# Patient Record
Sex: Male | Born: 1950 | Race: White | Hispanic: No | Marital: Married | Smoking: Never smoker
Health system: Southern US, Community
[De-identification: ages and names within clinical notes are randomized; demographics above are authoritative.]

## PROBLEM LIST (undated history)

## (undated) DIAGNOSIS — G629 Polyneuropathy, unspecified: Secondary | ICD-10-CM

## (undated) DIAGNOSIS — C801 Malignant (primary) neoplasm, unspecified: Secondary | ICD-10-CM

## (undated) DIAGNOSIS — E785 Hyperlipidemia, unspecified: Secondary | ICD-10-CM

## (undated) DIAGNOSIS — M1990 Unspecified osteoarthritis, unspecified site: Secondary | ICD-10-CM

## (undated) DIAGNOSIS — G5603 Carpal tunnel syndrome, bilateral upper limbs: Secondary | ICD-10-CM

## (undated) DIAGNOSIS — Z973 Presence of spectacles and contact lenses: Secondary | ICD-10-CM

## (undated) DIAGNOSIS — E039 Hypothyroidism, unspecified: Secondary | ICD-10-CM

## (undated) DIAGNOSIS — J849 Interstitial pulmonary disease, unspecified: Secondary | ICD-10-CM

## (undated) DIAGNOSIS — F329 Major depressive disorder, single episode, unspecified: Secondary | ICD-10-CM

## (undated) DIAGNOSIS — F32A Depression, unspecified: Secondary | ICD-10-CM

## (undated) DIAGNOSIS — Z8616 Personal history of COVID-19: Secondary | ICD-10-CM

## (undated) DIAGNOSIS — K2270 Barrett's esophagus without dysplasia: Secondary | ICD-10-CM

## (undated) DIAGNOSIS — K219 Gastro-esophageal reflux disease without esophagitis: Secondary | ICD-10-CM

## (undated) DIAGNOSIS — K639 Disease of intestine, unspecified: Secondary | ICD-10-CM

## (undated) DIAGNOSIS — IMO0001 Reserved for inherently not codable concepts without codable children: Secondary | ICD-10-CM

## (undated) DIAGNOSIS — J189 Pneumonia, unspecified organism: Secondary | ICD-10-CM

## (undated) DIAGNOSIS — I10 Essential (primary) hypertension: Secondary | ICD-10-CM

## (undated) HISTORY — PX: OTHER SURGICAL HISTORY: SHX169

## (undated) HISTORY — DX: Personal history of COVID-19: Z86.16

## (undated) HISTORY — PX: HERNIA REPAIR: SHX51

## (undated) HISTORY — DX: Malignant (primary) neoplasm, unspecified: C80.1

## (undated) HISTORY — PX: APPENDECTOMY: SHX54

## (undated) HISTORY — DX: Pneumonia, unspecified organism: J18.9

## (undated) HISTORY — PX: JOINT REPLACEMENT: SHX530

## (undated) HISTORY — PX: TOTAL HIP ARTHROPLASTY: SHX124

## (undated) HISTORY — DX: Interstitial pulmonary disease, unspecified: J84.9

## (undated) HISTORY — DX: Barrett's esophagus without dysplasia: K22.70

## (undated) HISTORY — DX: Polyneuropathy, unspecified: G62.9

## (undated) HISTORY — PX: TONSILLECTOMY: SUR1361

---

## 2004-08-09 ENCOUNTER — Ambulatory Visit (HOSPITAL_COMMUNITY): Admission: RE | Admit: 2004-08-09 | Discharge: 2004-08-10 | Payer: Self-pay | Admitting: Neurological Surgery

## 2005-09-18 HISTORY — PX: CERVICAL DISCECTOMY: SHX98

## 2008-09-18 HISTORY — PX: LAPAROSCOPIC APPENDECTOMY: SUR753

## 2009-04-09 ENCOUNTER — Encounter: Admission: RE | Admit: 2009-04-09 | Discharge: 2009-04-09 | Payer: Self-pay | Admitting: Family Medicine

## 2009-04-15 ENCOUNTER — Inpatient Hospital Stay (HOSPITAL_COMMUNITY): Admission: EM | Admit: 2009-04-15 | Discharge: 2009-04-17 | Payer: Self-pay | Admitting: Emergency Medicine

## 2009-04-15 ENCOUNTER — Encounter: Admission: RE | Admit: 2009-04-15 | Discharge: 2009-04-15 | Payer: Self-pay | Admitting: Family Medicine

## 2009-04-20 ENCOUNTER — Ambulatory Visit (HOSPITAL_COMMUNITY): Admission: RE | Admit: 2009-04-20 | Discharge: 2009-04-20 | Payer: Self-pay | Admitting: Surgery

## 2009-04-23 ENCOUNTER — Inpatient Hospital Stay (HOSPITAL_COMMUNITY): Admission: RE | Admit: 2009-04-23 | Discharge: 2009-04-27 | Payer: Self-pay | Admitting: Surgery

## 2009-04-23 ENCOUNTER — Encounter (INDEPENDENT_AMBULATORY_CARE_PROVIDER_SITE_OTHER): Payer: Self-pay | Admitting: Surgery

## 2009-05-02 ENCOUNTER — Emergency Department (HOSPITAL_COMMUNITY): Admission: EM | Admit: 2009-05-02 | Discharge: 2009-05-02 | Payer: Self-pay | Admitting: Emergency Medicine

## 2009-05-02 ENCOUNTER — Ambulatory Visit (HOSPITAL_COMMUNITY): Admission: EM | Admit: 2009-05-02 | Discharge: 2009-05-02 | Payer: Self-pay | Admitting: Emergency Medicine

## 2009-09-18 HISTORY — PX: INCISIONAL HERNIA REPAIR: SHX193

## 2009-10-22 ENCOUNTER — Ambulatory Visit (HOSPITAL_COMMUNITY): Admission: RE | Admit: 2009-10-22 | Discharge: 2009-10-23 | Payer: Self-pay | Admitting: Surgery

## 2010-03-28 ENCOUNTER — Encounter: Admission: RE | Admit: 2010-03-28 | Discharge: 2010-03-28 | Payer: Self-pay | Admitting: Family Medicine

## 2010-10-09 ENCOUNTER — Encounter: Payer: Self-pay | Admitting: Family Medicine

## 2010-12-07 LAB — BASIC METABOLIC PANEL
BUN: 13 mg/dL (ref 6–23)
CO2: 28 mEq/L (ref 19–32)
Calcium: 9.1 mg/dL (ref 8.4–10.5)
Chloride: 108 mEq/L (ref 96–112)
Creatinine, Ser: 0.94 mg/dL (ref 0.4–1.5)
GFR calc Af Amer: >60 mL/min (ref >60)
GFR calc non Af Amer: >60 mL/min (ref >60)
Glucose, Bld: 94 mg/dL (ref 70–99)
Potassium: 4.5 mEq/L (ref 3.5–5.1)
Sodium: 141 mEq/L (ref 135–145)

## 2010-12-07 LAB — DIFFERENTIAL
Basophils Absolute: 0 10*3/uL (ref 0.0–0.1)
Basophils Relative: 1 % (ref 0–1)
Eosinophils Absolute: 0.2 10*3/uL (ref 0.0–0.7)
Eosinophils Relative: 4 % (ref 0–5)
Lymphocytes Relative: 32 % (ref 12–46)
Lymphs Abs: 1.7 10*3/uL (ref 0.7–4.0)
Monocytes Absolute: 0.6 10*3/uL (ref 0.1–1.0)
Monocytes Relative: 11 % (ref 3–12)
Neutro Abs: 2.7 10*3/uL (ref 1.7–7.7)
Neutrophils Relative %: 52 % (ref 43–77)

## 2010-12-07 LAB — URINALYSIS, ROUTINE W REFLEX MICROSCOPIC
Bilirubin Urine: NEGATIVE
Glucose, UA: NEGATIVE mg/dL
Hgb urine dipstick: NEGATIVE
Ketones, ur: NEGATIVE mg/dL
Nitrite: NEGATIVE
Protein, ur: NEGATIVE mg/dL
Specific Gravity, Urine: 1.008 (ref 1.005–1.030)
Urobilinogen, UA: 0.2 mg/dL (ref 0.0–1.0)
pH: 7 (ref 5.0–8.0)

## 2010-12-07 LAB — CBC
HCT: 40.6 % (ref 39.0–52.0)
Hemoglobin: 13.5 g/dL (ref 13.0–17.0)
MCHC: 33.2 g/dL (ref 30.0–36.0)
MCV: 83.8 fL (ref 78.0–100.0)
Platelets: 207 10*3/uL (ref 150–400)
RBC: 4.85 MIL/uL (ref 4.22–5.81)
RDW: 16.5 % — ABNORMAL HIGH (ref 11.5–15.5)
WBC: 5.2 10*3/uL (ref 4.0–10.5)

## 2010-12-07 LAB — PROTIME-INR
INR: 0.96 (ref 0.00–1.49)
Prothrombin Time: 12.7 seconds (ref 11.6–15.2)

## 2010-12-24 LAB — CBC
HCT: 31.8 % — ABNORMAL LOW (ref 39.0–52.0)
HCT: 33.1 % — ABNORMAL LOW (ref 39.0–52.0)
HCT: 33.6 % — ABNORMAL LOW (ref 39.0–52.0)
HCT: 34.7 % — ABNORMAL LOW (ref 39.0–52.0)
Hemoglobin: 10.6 g/dL — ABNORMAL LOW (ref 13.0–17.0)
Hemoglobin: 10.9 g/dL — ABNORMAL LOW (ref 13.0–17.0)
Hemoglobin: 11.1 g/dL — ABNORMAL LOW (ref 13.0–17.0)
Hemoglobin: 11.5 g/dL — ABNORMAL LOW (ref 13.0–17.0)
MCHC: 32.3 g/dL (ref 30.0–36.0)
MCHC: 33.1 g/dL (ref 30.0–36.0)
MCHC: 33.4 g/dL (ref 30.0–36.0)
MCHC: 33.6 g/dL (ref 30.0–36.0)
MCV: 88.3 fL (ref 78.0–100.0)
MCV: 88.3 fL (ref 78.0–100.0)
MCV: 88.8 fL (ref 78.0–100.0)
MCV: 89.2 fL (ref 78.0–100.0)
Platelets: 295 10*3/uL (ref 150–400)
Platelets: 362 10*3/uL (ref 150–400)
Platelets: 376 10*3/uL (ref 150–400)
Platelets: 386 10*3/uL (ref 150–400)
RBC: 3.57 MIL/uL — ABNORMAL LOW (ref 4.22–5.81)
RBC: 3.75 MIL/uL — ABNORMAL LOW (ref 4.22–5.81)
RBC: 3.79 MIL/uL — ABNORMAL LOW (ref 4.22–5.81)
RBC: 3.93 MIL/uL — ABNORMAL LOW (ref 4.22–5.81)
RDW: 14.1 % (ref 11.5–15.5)
RDW: 14.1 % (ref 11.5–15.5)
RDW: 14.2 % (ref 11.5–15.5)
RDW: 14.2 % (ref 11.5–15.5)
WBC: 14 10*3/uL — ABNORMAL HIGH (ref 4.0–10.5)
WBC: 7.4 10*3/uL (ref 4.0–10.5)
WBC: 8.6 10*3/uL (ref 4.0–10.5)
WBC: 9.8 10*3/uL (ref 4.0–10.5)

## 2010-12-24 LAB — CULTURE, ROUTINE-ABSCESS

## 2010-12-24 LAB — DIFFERENTIAL
Basophils Absolute: 0 10*3/uL (ref 0.0–0.1)
Basophils Relative: 0 % (ref 0–1)
Eosinophils Absolute: 0.2 10*3/uL (ref 0.0–0.7)
Eosinophils Relative: 2 % (ref 0–5)
Lymphocytes Relative: 17 % (ref 12–46)
Lymphs Abs: 1.2 10*3/uL (ref 0.7–4.0)
Monocytes Absolute: 0.8 10*3/uL (ref 0.1–1.0)
Monocytes Relative: 10 % (ref 3–12)
Neutro Abs: 5.2 10*3/uL (ref 1.7–7.7)
Neutrophils Relative %: 70 % (ref 43–77)

## 2010-12-24 LAB — CULTURE, BLOOD (ROUTINE X 2)
Culture: NO GROWTH
Culture: NO GROWTH

## 2010-12-24 LAB — BASIC METABOLIC PANEL
BUN: 9 mg/dL (ref 6–23)
CO2: 25 mEq/L (ref 19–32)
Calcium: 8.4 mg/dL (ref 8.4–10.5)
Chloride: 100 mEq/L (ref 96–112)
Creatinine, Ser: 0.76 mg/dL (ref 0.4–1.5)
GFR calc Af Amer: >60 mL/min (ref >60)
GFR calc non Af Amer: >60 mL/min (ref >60)
Glucose, Bld: 154 mg/dL — ABNORMAL HIGH (ref 70–99)
Potassium: 5.1 mEq/L (ref 3.5–5.1)
Sodium: 132 mEq/L — ABNORMAL LOW (ref 135–145)

## 2010-12-25 LAB — BODY FLUID CULTURE

## 2010-12-25 LAB — CBC
HCT: 33.3 % — ABNORMAL LOW (ref 39.0–52.0)
HCT: 35.5 % — ABNORMAL LOW (ref 39.0–52.0)
HCT: 35.6 % — ABNORMAL LOW (ref 36.0–46.0)
Hemoglobin: 10.9 g/dL — ABNORMAL LOW (ref 13.0–17.0)
Hemoglobin: 11.3 g/dL — ABNORMAL LOW (ref 12.0–15.0)
Hemoglobin: 12 g/dL — ABNORMAL LOW (ref 13.0–17.0)
MCHC: 31.8 g/dL (ref 30.0–36.0)
MCHC: 32.9 g/dL (ref 30.0–36.0)
MCHC: 33.8 g/dL (ref 30.0–36.0)
MCV: 88 fL (ref 78.0–100.0)
MCV: 88.5 fL (ref 78.0–100.0)
MCV: 89.6 fL (ref 78.0–100.0)
Platelets: 215 10*3/uL (ref 150–400)
Platelets: 252 10*3/uL (ref 150–400)
Platelets: 414 10*3/uL — ABNORMAL HIGH (ref 150–400)
RBC: 3.71 MIL/uL — ABNORMAL LOW (ref 4.22–5.81)
RBC: 4.02 MIL/uL (ref 3.87–5.11)
RBC: 4.03 MIL/uL — ABNORMAL LOW (ref 4.22–5.81)
RDW: 13.8 % (ref 11.5–15.5)
RDW: 14.4 % (ref 11.5–15.5)
RDW: 14.6 % (ref 11.5–15.5)
WBC: 12.5 10*3/uL — ABNORMAL HIGH (ref 4.0–10.5)
WBC: 8.1 10*3/uL (ref 4.0–10.5)
WBC: 9.7 10*3/uL (ref 4.0–10.5)

## 2010-12-25 LAB — DIFFERENTIAL
Basophils Absolute: 0 10*3/uL (ref 0.0–0.1)
Basophils Absolute: 0 10*3/uL (ref 0.0–0.1)
Basophils Absolute: 0.1 10*3/uL (ref 0.0–0.1)
Basophils Relative: 0 % (ref 0–1)
Basophils Relative: 0 % (ref 0–1)
Basophils Relative: 1 % (ref 0–1)
Eosinophils Absolute: 0 10*3/uL (ref 0.0–0.7)
Eosinophils Absolute: 0.2 10*3/uL (ref 0.0–0.7)
Eosinophils Absolute: 0.2 10*3/uL (ref 0.0–0.7)
Eosinophils Relative: 1 % (ref 0–5)
Eosinophils Relative: 1 % (ref 0–5)
Eosinophils Relative: 2 % (ref 0–5)
Lymphocytes Relative: 13 % (ref 12–46)
Lymphocytes Relative: 14 % (ref 12–46)
Lymphocytes Relative: 15 % (ref 12–46)
Lymphs Abs: 1.2 10*3/uL (ref 0.7–4.0)
Lymphs Abs: 1.4 10*3/uL (ref 0.7–4.0)
Lymphs Abs: 1.7 10*3/uL (ref 0.7–4.0)
Monocytes Absolute: 0.7 10*3/uL (ref 0.1–1.0)
Monocytes Absolute: 1.2 10*3/uL — ABNORMAL HIGH (ref 0.1–1.0)
Monocytes Absolute: 1.2 10*3/uL — ABNORMAL HIGH (ref 0.1–1.0)
Monocytes Relative: 10 % (ref 3–12)
Monocytes Relative: 12 % (ref 3–12)
Monocytes Relative: 8 % (ref 3–12)
Neutro Abs: 6.2 10*3/uL (ref 1.7–7.7)
Neutro Abs: 7 10*3/uL (ref 1.7–7.7)
Neutro Abs: 9.4 10*3/uL — ABNORMAL HIGH (ref 1.7–7.7)
Neutrophils Relative %: 72 % (ref 43–77)
Neutrophils Relative %: 75 % (ref 43–77)
Neutrophils Relative %: 76 % (ref 43–77)

## 2010-12-25 LAB — COMPREHENSIVE METABOLIC PANEL
ALT: 21 U/L (ref 0–53)
ALT: 33 U/L (ref 0–35)
AST: 26 U/L (ref 0–37)
AST: 36 U/L (ref 0–37)
Albumin: 2.6 g/dL — ABNORMAL LOW (ref 3.5–5.2)
Albumin: 3.4 g/dL — ABNORMAL LOW (ref 3.5–5.2)
Alkaline Phosphatase: 58 U/L (ref 39–117)
Alkaline Phosphatase: 67 U/L (ref 39–117)
BUN: 10 mg/dL (ref 6–23)
BUN: 15 mg/dL (ref 6–23)
CO2: 23 mEq/L (ref 19–32)
CO2: 27 mEq/L (ref 19–32)
Calcium: 8.7 mg/dL (ref 8.4–10.5)
Calcium: 9.1 mg/dL (ref 8.4–10.5)
Chloride: 103 mEq/L (ref 96–112)
Chloride: 104 mEq/L (ref 96–112)
Creatinine, Ser: 0.94 mg/dL (ref 0.4–1.2)
Creatinine, Ser: 1.12 mg/dL (ref 0.4–1.5)
GFR calc Af Amer: >60 mL/min (ref >60)
GFR calc Af Amer: >60 mL/min (ref >60)
GFR calc non Af Amer: >60 mL/min (ref >60)
GFR calc non Af Amer: >60 mL/min (ref >60)
Glucose, Bld: 88 mg/dL (ref 70–99)
Glucose, Bld: 94 mg/dL (ref 70–99)
Potassium: 3.6 mEq/L (ref 3.5–5.1)
Potassium: 4.5 mEq/L (ref 3.5–5.1)
Sodium: 136 mEq/L (ref 135–145)
Sodium: 138 mEq/L (ref 135–145)
Total Bilirubin: 0.6 mg/dL (ref 0.3–1.2)
Total Bilirubin: 0.8 mg/dL (ref 0.3–1.2)
Total Protein: 7.4 g/dL (ref 6.0–8.3)
Total Protein: 7.5 g/dL (ref 6.0–8.3)

## 2010-12-25 LAB — ANAEROBIC CULTURE

## 2010-12-25 LAB — URINALYSIS, ROUTINE W REFLEX MICROSCOPIC
Bilirubin Urine: NEGATIVE
Bilirubin Urine: NEGATIVE
Glucose, UA: NEGATIVE mg/dL
Glucose, UA: NEGATIVE mg/dL
Hgb urine dipstick: NEGATIVE
Hgb urine dipstick: NEGATIVE
Ketones, ur: NEGATIVE mg/dL
Nitrite: NEGATIVE
Nitrite: NEGATIVE
Protein, ur: NEGATIVE mg/dL
Protein, ur: NEGATIVE mg/dL
Specific Gravity, Urine: 1.023 (ref 1.005–1.030)
Specific Gravity, Urine: >1.046 — ABNORMAL HIGH (ref 1.005–1.030)
Urobilinogen, UA: 0.2 mg/dL (ref 0.0–1.0)
Urobilinogen, UA: 0.2 mg/dL (ref 0.0–1.0)
pH: 5.5 (ref 5.0–8.0)
pH: 6 (ref 5.0–8.0)

## 2010-12-25 LAB — URINE CULTURE
Colony Count: NO GROWTH
Culture: NO GROWTH

## 2010-12-25 LAB — CULTURE, BLOOD (ROUTINE X 2)
Culture: NO GROWTH
Culture: NO GROWTH

## 2010-12-25 LAB — LIPASE, BLOOD: Lipase: 17 U/L (ref 11–59)

## 2010-12-25 LAB — LACTIC ACID, PLASMA: Lactic Acid, Venous: 0.8 mmol/L (ref 0.5–2.2)

## 2011-01-31 NOTE — Discharge Summary (Signed)
NAMELORON, WEIMER NO.:  [id number]   MEDICAL RECORD NO.:  [id number]          PATIENT TYPE:  INP   LOCATION:  1301                         FACILITY:  [facility]   PHYSICIAN:  Velora Heckler, MD      DATE OF BIRTH:  [birth year]   DATE OF ADMISSION:  04/15/2009  DATE OF DISCHARGE:  04/17/2009                               DISCHARGE SUMMARY   REASON FOR ADMISSION:  Right flank pain, fever.   HISTORY OF PRESENT ILLNESS:  The patient is a 60 year old white male who  was seen by his primary care physician for a 74-month history of right  flank pain.  CT scan showed an inflammatory process arising from the  cecum and extending retrocolic along the retroperitoneum of the right  flank.  There was a 3.1 cm loculated abscess.   The patient was admitted on the General Surgical Service.  He was  treated with intravenous antibiotics.  He underwent percutaneous  aspiration of the fluid collection which grew strep species among other  bacteria.  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  The patient  improved with resolution of his fevers.  He was prepared for discharge  home on July 31.   DISCHARGE PLAN:  The patient is discharged home April 17, 2009.  He is  taking Augmentin 875 mg b.i.d.  Surgery for appendectomy was planned for  4-6 weeks from discharge.  The patient will be seen back at my office at  [facility] in 7-10 days for reevaluation.   FINAL DIAGNOSIS:  Chronic appendicitis.   CONDITION AT DISCHARGE:  Improved.      Velora Heckler, MD  Electronically Signed     TMG/MEDQ  D:  04/26/2009  T:  04/26/2009  Job:  [id number]

## 2011-01-31 NOTE — Consult Note (Signed)
[name] Kitchen  Colin [name], Colin [name] NO.:  [id number]   MEDICAL RECORD NO.:  [id number]          PATIENT TYPE:  EMS   LOCATION:  ED                           FACILITY:  [facility]   PHYSICIAN:  Angelia Mould. Derrell Lolling, M.D.DATE OF BIRTH:  05-Feb-1951   DATE OF CONSULTATION:  05/02/2009  DATE OF DISCHARGE:                                 CONSULTATION   REASON FOR CONSULTATION:  Fever, incisional pain.   PRESENT ILLNESS:  This is a 60 year old Caucasian gentleman who was  operated upon by Dr. Darnell Level on April 23, 2009 for appendicitis.  The appendix was high in the right upper quadrant.  This required a  right subcostal incision and mobilization of the colon and an  appendectomy using a stapling device on the cecum.  The patient did  reasonably well and went home after a few days.  He was seen in the  office last week by Dr. Gerrit Friends.  Staples were removed.  He has been  eating fair and having bowel movements and voiding uneventfully.  He has  a 24-hour history of fever and mild chills but up to almost 102.  They  called earlier today and I asked them to come to the emergency room.   Evaluation in the emergency room revealed a white blood cell count  12,000.  A CT scan showed abscess in the abdominal wall and right  subcostal area between the internal and external obliques and a little  bit of air bubbles.  Intra-abdominally there were a couple of very tiny  fluid collections, but nothing of any clinical significance.   PAST HISTORY:  1. Status post total hip replacement.  2. Status post cervical diskectomy.  3. Gastroesophageal reflux disease.  4. Hyperlipidemia.  5. Status post open appendectomy, now 9 days postop.   CURRENT MEDICATIONS:  1. Augmentin 875 mg p.o. b.i.d.  2. Lovastatin.  3. Nexium.  4. Baby aspirin.   DRUG ALLERGIES:  None known.   SOCIAL HISTORY:  Married with three children.  His wife is with him  today.  He works in Careers information officer.  Denies  tobacco use.  Drinks alcohol on occasion.   FAMILY HISTORY:  Unremarkable and noncontributory.   REVIEW OF SYSTEMS:  A 10-system review of systems otherwise negative.   PHYSICAL EXAM:  CONSTITUTIONAL:  Pleasant, alert Caucasian male in mild  distress.  He does not look toxic or septic at all.  VITAL SIGNS:  Initial temperature was 98.6 with a blood pressure 107/72  and pulse 71, respirations 20.  I had to take his temperature again  after exam and it was 102.2.  NECK:  No adenopathy or mass.  LUNGS:  Clear to auscultation with no real chest wall tenderness except  to percussion in the right costal margin anteriorly is a little bit  tender.  HEART:  Regular rate and rhythm.  I do not hear any murmurs.  Radial and  femoral pulses are palpable.  ABDOMEN:  There is a right subcostal incision with Steri-Strips in  place.  I do not see any cellulitis.  I  do not see any active drainage.  There is no necrosis.  Wound is tender laterally and also he is a little  bit tender in the right flank inferiorly but really no real evidence of  cellulitis.  The rest of the abdomen is soft and benign.  EXTREMITIES:  Moves all four extremities well without pain or deformity.   PROCEDURE:  The right subcostal wound was prepped and draped in sterile  fashion.  1% Xylocaine with epinephrine was used as a local infiltration  anesthetic.  He was given 2 mg of IV morphine.  The lateral aspect of  the wound, I separated the skin edges ultimately for a distance of about  8 cm.  I did not find abscess initially.  I then aspirated the wound  inferolaterally with a 18 gauge needle and got gross purulent fluid  which was sent for culture.  I then further explored the wound and cut  the sutures out of the external oblique, and entered a large abscess  cavity and drained large amount of foul smelling tan-colored pus.  This  aspect of the wound was then irrigated and explored and the entire  cavity was felt to be  completely evacuated.  The deep fascia was intact.  After washing this out, hemostasis was good.  I packed the wound with  saline moistened Kerlix covered with dry bandages, and taped the bandage  in place.  He tolerated this well.   ASSESSMENT:  1. Deep intramuscular abdominal wall abscess within right subcostal      wound.  2. Status post open appendectomy, 9 days postoperative.   PLAN:  1. The patient been given some IV fluids.  2. The patient be given a single dose of IV and Invanz.  3. The patient will be allowed to be discharged home.  4. He will continue oral Augmentin which he has plenty of at home.  5. He will be given a prescription for Tylox  6. He is asked to return to the office in 24 hours for evaluation by      Dr. Gerrit Friends or one of the physicians to change the bandage and to      make sure everything is okay and then to begin home health care      nursing for b.i.d. dressing changes.      Angelia Mould. Derrell Lolling, M.D.  Electronically Signed     HMI/MEDQ  D:  05/02/2009  T:  05/02/2009  Job:  [id number]

## 2011-01-31 NOTE — Discharge Summary (Signed)
[name], NACK NO.:  [id number]   MEDICAL RECORD NO.:  [id number]          PATIENT TYPE:  INP   LOCATION:  1535                         FACILITY:  [facility]   PHYSICIAN:  Velora Heckler, MD      DATE OF BIRTH:  06-Mar-1951   DATE OF ADMISSION:  04/23/2009  DATE OF DISCHARGE:  04/27/2009                               DISCHARGE SUMMARY   REASON FOR ADMISSION:  Chronic appendicitis.   HISTORY OF PRESENT ILLNESS:  Colin [name] is a 60 year old white male  who was admitted in late July with 64-month history of right flank pain.  He was found to have chronic appendicitis on CT scan.  An attempt was  made at antibiotic therapy with plan for interval appendectomy.  This  was unsuccessful due to persistent pain.  The patient was therefore  prepared and brought to the operating room on August 6.   HOSPITAL COURSE:  The patient underwent laparotomy on August 6.  He  required open appendectomy due to retrocecal chronically infected  appendix.  Postoperatively, he did well.  He received intravenous  antibiotics.  His ileus gradually resolved, and his diet was advanced.  His pain was controlled.  He was prepared for discharge home on the  fourth postoperative day.   DISCHARGE PLANNING:  The patient is discharged home today April 27, 2009, in good condition, tolerating a regular diet, and ambulating  independently.  The patient will be seen back in my office at [facility] in 4 days for wound check and staple removal.  Discharge medications include Vicodin as needed for pain and Augmentin  875 mg b.i.d. for 7 days.   FINAL DIAGNOSIS:  Acute and chronic appendicitis.   CONDITION ON DISCHARGE:  Is improved.      Velora Heckler, MD  Electronically Signed     TMG/MEDQ  D:  04/27/2009  T:  04/27/2009  Job:  [id number]   cc:   Velora Heckler, MD  [address] [place]  [place] [address]

## 2011-01-31 NOTE — Op Note (Signed)
NAMEPLES, TRUDEL NO.:  [id number]   MEDICAL RECORD NO.:  [id number]          PATIENT TYPE:  INP   LOCATION:  1535                         FACILITY:  [facility]   PHYSICIAN:  Velora Heckler, MD      DATE OF BIRTH:  06/19/1951   DATE OF PROCEDURE:  04/23/2009  DATE OF DISCHARGE:                               OPERATIVE REPORT   PREOPERATIVE DIAGNOSIS:  Chronic appendicitis.   POSTOPERATIVE DIAGNOSIS:  Chronic appendicitis.   PROCEDURE:  1. Diagnostic laparoscopy.  2. Open appendectomy (retrocecal).   SURGEON:  Velora Heckler, MD, FACS   ANESTHESIA:  General.   ESTIMATED BLOOD LOSS:  Minimal.   PREPARATION:  ChloraPrep.   COMPLICATIONS:  None.   INDICATIONS:  The patient is a 60 year old white male originally  admitted April 15, 2009, from the emergency department with right upper  quadrant abdominal pain.  CT scan demonstrated an inflammatory process  in the retrocecal retroperitoneum.  The patient was treated with  intravenous antibiotics.  A CT guided aspiration was performed and  cultures grew streptococcus species.  The patient was discharged home on  oral antibiotics with the intention of performing an interval  appendectomy in 4-6 weeks.  However, the patient contacted the office  with persistent pain.  A repeat CT scan of the abdomen and pelvis  demonstrated persistent inflammatory changes and findings which were  felt to be more typical of chronic appendicitis.  CBC was obtained and  showed a normal white blood cell count of 7.4.  After discussion with  the patient, the decision was made to proceed with operative  intervention at this time.   DESCRIPTION OF PROCEDURE:  The procedure was done in OR #11 at the  [facility].  The patient was brought to the  operating room and placed in a supine position on the operating room  table.  Following administration of general anesthesia, the patient was  prepped and draped in the usual  strict aseptic fashion.  After  ascertaining that an adequate level of anesthesia had been achieved, an  infraumbilical incision was made with a #15 blade.  Dissection was  carried down to the fascia.  The fascia was incised in the midline and  the peritoneal cavity was entered cautiously.  A 0 Vicryl pursestring  suture was placed in the fascia.  An Hassan cannula was introduced and  secured with a pursestring suture.  The abdomen was insufflated with  carbon dioxide and a laparoscope was inserted for exploration.  Operative ports were placed in the midline in the subxiphoid space and  in the mid-epigastrium with 5 mm trocars.  Using Glassman clamps, the  omentum which is sizable and the bowel were gently mobilized towards the  left and inferiorly.  This exposed a very high-riding cecum in the right  upper quadrant.  There was a dense inflammatory process lateral and  posterior to the cecum.  The base of the appendix was visible, but I was  unable to successfully mobilize the cecum or ascending colon along the  right colic gutter due  to the dense inflammatory process which was  present.  After failing to mobilize the right colon, the decision was  made to convert to open surgery.   The patient was turned slightly to the left with the head elevated.  A  right subcostal incision was then made with a #10 blade.  Dissection was  carried through subcutaneous tissues.  The muscle wall was divided in  layers with the electrocautery and the peritoneal cavity was entered  cautiously.  The abdomen was explored.  There was indeed an extensive  dense inflammatory process behind the ascending colon.  The cecum was  very high riding, almost to the point of being in the right upper  quadrant at what would normally be the location of the hepatic flexure.  The retroperitoneum was incised along the lateral terminal ileum.  With  gentle dissection, the cecum was mobilized.  The inflammatory process   extended nearly to the kidney.  The right colon was mobilized around the  hepatic flexure.  The base of the appendix was dissected out.  Using  gentle blunt dissection, the entire inflammatory mass containing the  appendix was mobilized from the retrocecal and retrocolic space.  The  colonic mesentery was divided and suture ligated with 2-0 silk suture  ligatures for hemostasis.  Dissection was carried down to the base of  the appendix.  The mesoappendix was divided with the harmonic scalpel.  The base of the appendix was delineated at the cecal wall.  It was  moderately thickened.  A 60 mm TA stapler with the thick cartridge was  utilized to secure the cecal wall at the base of the appendix and the  base of the appendix was transected with a #10 blade.  Specimen was  submitted in its entirety to pathology.  Dr. Charlott Rakes examined the  specimen grossly and stated that indeed this was a chronically inflamed  appendix.  It looks as if it may have a large diverticulum that may have  been the source of infection.  It appeared that the entire appendix had  been removed.  It also appeared that there was no evidence of malignancy  on the gross specimen.   The right upper quadrant and retroperitoneum were then copiously  irrigated.  Good hemostasis was noted.  A 19-French drain was brought in  from an inferior stab wound and placed in the retroperitoneum up to the  liver.  It was secured to the skin with a 3-0 nylon suture.  All packs  were removed.  After irrigation, the operative field was covered with  the omentum.  The abdominal wall was closed in two layers with running  #1 Novofil sutures.  The subcutaneous tissues were irrigated.  The skin  was closed with stainless steel staples.  The midline incisions were  closed with staples.  All ports were removed.  Vicryl 0 pursestring  sutures tied at the umbilicus.  The umbilical incision was closed with  stainless steel staples.  The wounds  were washed and dried and sterile  dressings were applied.  Drain was placed to bulb suction.  The patient  was awakened from anesthesia and taken to the recovery room in stable  condition.  The patient tolerated the procedure well.      Velora Heckler, MD  Electronically Signed     TMG/MEDQ  D:  04/23/2009  T:  04/24/2009  Job:  [id number]

## 2011-01-31 NOTE — H&P (Signed)
NAME:  Colin [name], Colin [name] NO.:  [id number]   MEDICAL RECORD NO.:  [id number]          PATIENT TYPE:  EMS   LOCATION:  ED                           FACILITY:  [facility]   PHYSICIAN:  Velora Heckler, MD      DATE OF BIRTH:  08-13-1951   DATE OF ADMISSION:  04/15/2009  DATE OF DISCHARGE:                              HISTORY & PHYSICAL   CHIEF COMPLAINT:  Right flank pain, fever.   HISTORY OF PRESENT ILLNESS:  The patient is a 60 year old white male who  underwent CT scan today at the request of Dr. Tally Joe from [facility].  CT scan was performed at [facility].  It  demonstrated a superiorly located cecum.  There was an inflammatory  process arising from the cecum and extending posteriorly along the  undersurface of the liver into the right flank.  There was a 3.1 x 2.7  cm loculated abscess.  The patient presented to the emergency  department.  General Surgery was called for evaluation and  recommendations.   The patient notes approximately a 66-month history of right flank pain.  This was worse with deep inspiration.  The patient has also had a  chronic cough.  The patient had a chest x-ray 1 week ago which was  unrevealing.  The patient has had significant fever over the past few  days.  His initial temperature on arrival to the emergency department  was 102.8.  The patient denies any nausea or vomiting.  His appetite has  been good.  His bowel habits have been normal.  He has had no prior  abdominal surgery.   PAST MEDICAL HISTORY:  1. Status post total hip replacement.  2. Status post cervical diskectomy.  3. History of gastroesophageal reflux.  4. History of hyperlipidemia.   MEDICATIONS:  1. Lovastatin.  2. Nexium.  3. Baby aspirin.   ALLERGIES:  NO KNOWN DRUG ALLERGIES.   SOCIAL HISTORY:  The patient is married with 3 children.  He is  accompanied by his wife.  He works in Careers information officer.  He  denies tobacco use.   He drinks alcohol on occasion.   FAMILY HISTORY:  Unremarkable.   REVIEW OF SYSTEMS:  A 15-system review otherwise negative.   PHYSICAL EXAMINATION:  GENERAL:  A 60 year old well-developed, well-  nourished white male on a stretcher in the emergency department in no  acute distress.  VITAL SIGNS:  Temperature 99.1.  Temperature maximum during the  emergency department stay 102.8.  Pulse 82.  Respirations 20, blood  pressure 117/77.  HEENT:  Shows him to be normocephalic, atraumatic.  Sclerae clear.  Conjunctivae clear.  Dentition fair.  Mucous membranes moist.  Voice  normal.  NECK:  Palpation of the neck shows no lymphadenopathy.  No mass.  No  tenderness.  Well-healed surgical wound left anterior neck.  CHEST:  Auscultation of the chest shows good breath sounds bilaterally  without rales, rhonchi or wheeze.  CARDIAC:  Regular rate and rhythm without murmur.  Peripheral pulses are  full.  EXTREMITIES:  Nontender without  edema.  ABDOMEN:  Soft without distention.  Bowel sounds were present.  No  surgical wounds are visible.  No sign of hernia.  Palpation shows no  hepatosplenomegaly.  No palpable mass and no tenderness.  Palpation in  the flanks bilaterally shows the suggestion of mild tenderness in the  right flank just below the posterior axillary line at the costal margin.  No cutaneous changes.  NEUROLOGICALLY:  The patient is alert and oriented without focal  deficit.  There is no sign of tremor.   LABORATORY STUDIES:  White count 8.1, hemoglobin 12.0, platelet count  252,000.  Differential shows 76% segmented neutrophils.  Electrolytes  are normal.  Liver function tests are normal.   DIAGNOSTICS:  CT scan abdomen and pelvis with findings as noted above.  Apparently a 3-cm abscess in the right flank retroperitoneum possibly  associated with the appendix.   IMPRESSION:  Probable chronic appendicitis with abscess formation.   PLAN:  The patient will be admitted to the  General Surgery Service at  [facility].  Intravenous antibiotics will be initiated.  The  patient will be discussed in the morning with Interventional Radiology  for possibility of percutaneous aspiration versus drainage.  The patient  will likely require a 4-6 week course of antibiotics followed by  interval appendectomy.  This was discussed with the patient and his  family.  He understands.      Velora Heckler, MD  Electronically Signed     TMG/MEDQ  D:  04/15/2009  T:  04/16/2009  Job:  [id number]   cc:   Velora Heckler, MD  [address] [place]  [place] [address]

## 2011-02-03 NOTE — Op Note (Signed)
[name], [name] NO.:  [id number]   MEDICAL RECORD NO.:  [id number]          PATIENT TYPE:  OIB   LOCATION:  2899                         FACILITY:  [facility]   PHYSICIAN:  Stefani Dama, M.D.  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   POSTOPERATIVE DIAGNOSES:  Cervical spondylosis with cervical radiculopathy  at C5-6 and C6-7.   PROCEDURE:  Anterior cervical decompression and arthrodesis with structural  allograft, Alphatek plate fixation.   SURGEON:  Stefani Dama, M.D.   ASSISTANT:  Payton Doughty, M.D.   ANESTHESIA:  General endotracheal anesthesia.   INDICATIONS FOR PROCEDURE:  The patient is a 60 year old individual who has  had significant neck, shoulder and arm pain, having failed extensive medical  conservative therapy.  He does have evidence of significant spondylitic  disease at C5-6 and C6-7.  He has been advised regarding surgical  decompression.   DESCRIPTION OF PROCEDURE:  The patient was brought to the operating room and  placed on the table in the supine position.  After a smooth induction of  general endotracheal anesthesia, he was placed in 5 pounds of Holter  traction.  The neck was prepped with Duraprep and draped in a sterile  fashion.  A transverse incision was created and carried down to the platysma  which was divided.  The plane between the sternocleidomastoid and strap  muscles was dissected bluntly until the prevertebral space was reached.  The  first identifiable disk space was noted to be that of C5-C6.  This was on a  localizing radiograph.  The longus coli muscle was then stripped and a self-  retaining Casper retractor was placed in the wound.  The anterior  longitudinal ligament at C5-6 was then opened and the combination of the  curets  and rongeurs was used to dissect and remove significantly degenerated  disk material at C5-C6.  The posterior longitudinal ligament region was  identified and significant osteophytes from the inferior margin of the body  of C5 and the superior margin of the body of C6 were encountered.  These  were drilled down with a 2.3 mm dissecting tool and a high-speed air drill.  Dissection was carried out to the uncinate process.  There were large  uncinate spurs encountered and these were similarly drilled down and  removed.  The nerve roots at C6 were then decompressed.  Hemostasis was  achieved in the extradural space with some small pledgets of Gelfoam soaked  in thrombin, which were later removed.  An 8 mm standard size trans-graft  was then fashioned into the interspace, and this was filled with the  patient's own bone chips from the uncinate.  This was placed into the  interspace.  Attention was then turned to C6-7 where a similar decompression was carried  out, again with large osteophytes from the inferior margin of the body of C6  and the superior  margin of the body of C7 being encountered.  The lateral  recesses were similarly decompressed.  A similar 8 mm bone graft was placed  into the interspace.  The anterior borders of C5 to C7 were then affixed  with a standard size Alphatek plate measuring 40 mm in length, and 14 mm fix  screws were placed in C6 and C7, and variable angle screws were placed in  C5.  Hemostasis was then obtained in the soft tissues.  The plate was locked  into position and radiograph confirmed position of the plate.  The platysma  was then closed with #3-0 Vicryl in an interrupted fashion, and #3-0 Vicryl  was used in the subcuticular tissues, and Dermabond was placed on the skin.  The patient tolerated the procedure well and was returned to the recovery  room in stable condition.      Henr   HJE/MEDQ  D:  08/09/2004  T:  08/09/2004  Job:  [id number]

## 2011-10-24 ENCOUNTER — Other Ambulatory Visit: Payer: Self-pay | Admitting: Dermatology

## 2012-04-18 ENCOUNTER — Encounter (HOSPITAL_BASED_OUTPATIENT_CLINIC_OR_DEPARTMENT_OTHER): Payer: Self-pay | Admitting: *Deleted

## 2012-04-18 ENCOUNTER — Other Ambulatory Visit: Payer: Self-pay | Admitting: Orthopedic Surgery

## 2012-04-18 NOTE — Progress Notes (Signed)
To come in for bmet-ekg Has had sob off and on yrs Wt related-denies sleep apnea Had stress test 08-negative with dr Lynnell Jude not had to go back Had a complicated lap append 2010 that turned into open-then had inc hernia with mesh 2011-infection-in hospital several days-no problems with anesthesia.

## 2012-04-19 ENCOUNTER — Encounter: Payer: Self-pay | Admitting: Cardiovascular Disease

## 2012-04-19 ENCOUNTER — Encounter (HOSPITAL_BASED_OUTPATIENT_CLINIC_OR_DEPARTMENT_OTHER)
Admission: RE | Admit: 2012-04-19 | Discharge: 2012-04-19 | Disposition: A | Discharge status: Home / Self Care | Payer: 59 | Source: Ambulatory Visit | Attending: Orthopedic Surgery | Admitting: Orthopedic Surgery

## 2012-04-19 ENCOUNTER — Ambulatory Visit (INDEPENDENT_AMBULATORY_CARE_PROVIDER_SITE_OTHER): Payer: 59 | Admitting: Cardiovascular Disease

## 2012-04-19 VITALS — BP 110/76 | HR 64 | Ht 73.0 in | Wt 272.2 lb

## 2012-04-19 DIAGNOSIS — Z01810 Encounter for preprocedural cardiovascular examination: Secondary | ICD-10-CM | POA: Insufficient documentation

## 2012-04-19 DIAGNOSIS — R0600 Dyspnea, unspecified: Secondary | ICD-10-CM | POA: Insufficient documentation

## 2012-04-19 DIAGNOSIS — I10 Essential (primary) hypertension: Secondary | ICD-10-CM

## 2012-04-19 LAB — BASIC METABOLIC PANEL
BUN: 14 mg/dL (ref 6–23)
CO2: 22 mEq/L (ref 19–32)
Calcium: 9 mg/dL (ref 8.4–10.5)
Chloride: 104 mEq/L (ref 96–112)
Creatinine, Ser: 0.91 mg/dL (ref 0.50–1.35)
GFR calc Af Amer: >90 mL/min (ref >90)
GFR calc non Af Amer: >90 mL/min (ref >90)
Glucose, Bld: 101 mg/dL — ABNORMAL HIGH (ref 70–99)
Potassium: 4.6 mEq/L (ref 3.5–5.1)
Sodium: 137 mEq/L (ref 135–145)

## 2012-04-19 NOTE — Progress Notes (Signed)
HPI  This is a 61 year old male who is here today for preoperative cardiovascular evaluation in anticipation of carpal tunnel surgery which is scheduled for next week. He reports no previous cardiac history. He was seen 4 years ago by Dr. Patty Sermons for atypical chest pain. At that time he had a stress test done which was unremarkable. He has history of hypertension and hyperlipidemia which are both well controlled with medications. He is obese and has gained some weight lately. He has been having increased exertional dyspnea without chest pain. He denies any orthopnea, PND or lower extremity edema. There is no reported syncope or presyncope. He had an ECG done today which was abnormal and showed T wave inversion in the anterior and lateral leads. He is a nonsmoker and has no family history of premature coronary artery disease.   No Known Allergies   Current Outpatient Prescriptions on File Prior to Visit  Medication Sig Dispense Refill  . aspirin 81 MG tablet Take 81 mg by mouth daily.      . fish oil-omega-3 fatty acids 1000 MG capsule Take 1 g by mouth daily.       Marland Kitchen losartan-hydrochlorothiazide (HYZAAR) 50-12.5 MG per tablet Take 1 tablet by mouth daily.      Marland Kitchen lovastatin (MEVACOR) 40 MG tablet Take 40 mg by mouth every morning.         Past Medical History  Diagnosis Date  . Shortness of breath   . Hypertension   . Carpal tunnel syndrome, bilateral   . GERD (gastroesophageal reflux disease)   . Hyperlipemia   . Arthritis   . Cancer     skin cancer     Past Surgical History  Procedure Date  . Cervical discectomy 2007  . Tonsillectomy   . Joint replacement 81,87    lt total hip  . Laparoscopic appendectomy 2010    required open repair  . Incisional hernia repair 2011    subcostal from append  . Total hip arthroplasty     x2 left hip     History reviewed. No pertinent family history.   History   Social History  . Marital Status: Married    Spouse Name: N/A      Number of Children: N/A  . Years of Education: N/A   Occupational History  . Not on file.   Social History Main Topics  . Smoking status: Never Smoker   . Smokeless tobacco: Not on file  . Alcohol Use: Yes     ocassionally  . Drug Use: Not on file  . Sexually Active:    Other Topics Concern  . Not on file   Social History Narrative  . No narrative on file     ROS Constitutional: Negative for fever, chills, diaphoresis, activity change, appetite change and fatigue.  HENT: Negative for hearing loss, nosebleeds, congestion, sore throat, facial swelling, drooling, trouble swallowing, neck pain, voice change, sinus pressure and tinnitus.  Eyes: Negative for photophobia, pain, discharge and visual disturbance.  Respiratory: Negative for apnea, cough, chest tightness and wheezing.  Cardiovascular: Negative for chest pain, palpitations and leg swelling.  Gastrointestinal: Negative for nausea, vomiting, abdominal pain, diarrhea, constipation, blood in stool and abdominal distention.  Genitourinary: Negative for dysuria, urgency, frequency, hematuria and decreased urine volume.  Musculoskeletal: Negative for myalgias, back pain, joint swelling, arthralgias and gait problem.  Skin: Negative for color change, pallor, rash and wound.  Neurological: Negative for dizziness, tremors, seizures, syncope, speech difficulty, weakness, light-headedness, numbness  and headaches.  Psychiatric/Behavioral: Negative for suicidal ideas, hallucinations, behavioral problems and agitation. The patient is not nervous/anxious.     PHYSICAL EXAM   BP 110/76  Pulse 64  Ht 6\' 1"  (1.854 m)  Wt 272 lb 4 oz (123.492 kg)  BMI 35.92 kg/m2 Constitutional: He is oriented to person, place, and time. He appears well-developed and well-nourished. No distress.  HENT: No nasal discharge.  Head: Normocephalic and atraumatic.  Eyes: Pupils are equal and round. Right eye exhibits no discharge. Left eye exhibits no  discharge.  Neck: Normal range of motion. Neck supple. No JVD present. No thyromegaly present.  Cardiovascular: Normal rate, regular rhythm, normal heart sounds and. Exam reveals no gallop and no friction rub. No murmur heard.  Pulmonary/Chest: Effort normal and breath sounds normal. No stridor. No respiratory distress. He has no wheezes. He has no rales. He exhibits no tenderness.  Abdominal: Soft. Bowel sounds are normal. He exhibits no distension. There is no tenderness. There is no rebound and no guarding.  Musculoskeletal: Normal range of motion. He exhibits no edema and no tenderness.  Neurological: He is alert and oriented to person, place, and time. Coordination normal.  Skin: Skin is warm and dry. No rash noted. He is not diaphoretic. No erythema. No pallor.  Psychiatric: He has a normal mood and affect. His behavior is normal. Judgment and thought content normal.       EKG: His EKG was reviewed which showed sinus bradycardia with a heart rate of 51 beats per minute. There was T-wave inversion in V2 and V3 as well as 1 and aVL .these T waves are not deep.    ASSESSMENT AND PLAN

## 2012-04-19 NOTE — Progress Notes (Signed)
Dr. Jean Rosenthal spoke with pt about EKG, I took EKG from Dr. Merita Norton office to her so she compared definite changes from last EKG in 2009. Dr. Jean Rosenthal would like for him to get Cardiac clearance before surgery on Tuesday.

## 2012-04-19 NOTE — Progress Notes (Signed)
Pt in for BMET and Ekg. Ekg shown to Dr.Jackson noted T wave abnormality and wanted a previous EKG to compare, last office notes , stress test and echocardiogram from Dr. Yevonne Pax office. Requested the above from Dr. Yevonne Pax office, records are in a warehouse may take 2-3 days to get -- informed Dr. Jean Rosenthal. Dr. Jean Rosenthal requested to call Dr. Merita Norton office to see if they have an EKG. Explained to pt Dr. Edison Pace concern about EKG. Pt would like to talk with her.

## 2012-04-19 NOTE — Progress Notes (Signed)
Called [facility] and got pt appt. For today at 3:30pm with Dr. Rachel Moulds  In [facility] for cardiac clearance before surgery Tuesday per Dr. Edison Pace request.

## 2012-04-19 NOTE — Assessment & Plan Note (Addendum)
The patient has symptoms of exertional dyspnea without chest pain. His dyspnea could be due to physical deconditioning and recent weight gain. However, he has an abnormal ECG with  inverted T waves in the anterior and lateral leads. Thus, I recommend evaluation with a treadmill nuclear stress test before the surgery. This will be scheduled on Monday. Due to his ECG changes, I do not think a treadmill stress test alone would be sufficient. If his stress test comes back normal, he can undergo the planned surgery at an overall low risk.

## 2012-04-19 NOTE — Patient Instructions (Addendum)
Your physician has requested that you have en exercise stress myoview. For further information please visit [email or web address]. Please follow instruction sheet, as given.   

## 2012-04-19 NOTE — Assessment & Plan Note (Signed)
His blood pressure is well-controlled 

## 2012-04-22 ENCOUNTER — Ambulatory Visit: Payer: Self-pay | Admitting: Cardiovascular Disease

## 2012-04-22 ENCOUNTER — Other Ambulatory Visit: Payer: Self-pay | Admitting: Cardiovascular Disease

## 2012-04-22 ENCOUNTER — Telehealth: Payer: Self-pay

## 2012-04-22 ENCOUNTER — Encounter: Payer: Self-pay | Admitting: Cardiovascular Disease

## 2012-04-22 DIAGNOSIS — R079 Chest pain, unspecified: Secondary | ICD-10-CM

## 2012-04-22 DIAGNOSIS — R0600 Dyspnea, unspecified: Secondary | ICD-10-CM

## 2012-04-22 NOTE — Progress Notes (Signed)
Obtained results of cardiac stress test.  Results given to Dr Inda Merlin for surg.

## 2012-04-22 NOTE — H&P (Signed)
  Colin [name] is an 61 y.o. male.   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.  He has history of chronic hand numbness dating back more than five years.  He enjoys playing classical guitar.  His hands have gotten numb to the point where he can no long effectively play his guitar.  He has had significant symptoms that did not respond to splinting as far back as 2009.  He had electrodiagnostic studies  performed at [facility], on the left side and was told he had significant carpal tunnel syndrome.  He did not follow-up with further treatment at that time. He has also had prior anterior cervical procedure for degenerative disc disease with an anterior fusion performed by Dr. Jeral Fruit.  He states that the procedure did not affect his hand numbness.     Past Medical History  Diagnosis Date  . Shortness of breath   . Carpal tunnel syndrome, bilateral   . GERD (gastroesophageal reflux disease)   . Hyperlipemia   . Arthritis   . Cancer     skin cancer  . Hypertension     Past Surgical History  Procedure Date  . Cervical discectomy 2007  . Tonsillectomy   . Joint replacement 81,87    lt total hip  . Laparoscopic appendectomy 2010    required open repair  . Incisional hernia repair 2011    subcostal from append  . Total hip arthroplasty     x2 left hip    No family history on file. Social History:  reports that he has never smoked. He does not have any smokeless tobacco history on file. He reports that he drinks alcohol. His drug history not on file.  Allergies: No Known Allergies  No prescriptions prior to admission    No results found for this or any previous visit (from the past 48 hour(s)).  No results found.   Pertinent items are noted in HPI.  Height 6\' 1"  (1.854 m), weight 122.471 kg (270 lb).  General appearance: alert Head: Normocephalic, without  obvious abnormality Neck: supple, symmetrical, trachea midline Resp: clear to auscultation bilaterally Cardio: regular rate and rhythm GI: normal findings: bowel sounds normal Extremities:Inspection of his hands reveals no thenar atrophy.  He has diminished sweat patterns bilaterally in the median distribution.  He has no sign of stenosing tenosynovitis.  Pulse and cap refill are intact.  He does not have thenar atrophy right or left. He notes that overall his degree of sensibility has substantially decreased in the past three months.   Pulses: 2+ and symmetric Skin: normal Neurologic: Grossly normal    Assessment/Plan Impression:Left CTS  Plan:To the OR for left CTR.The procedure, risks,benefits and post-op course were discussed with the patient at length and they were in agreement with the plan.   DASNOIT,Alizeh Madril J 04/22/2012, 4:28 PM    H&P documentation: 04/23/2012  -History and Physical Reviewed  -Patient has been re-examined  -No change in the plan of care  Wyn Forster, MD

## 2012-04-22 NOTE — Telephone Encounter (Signed)
Per Dr. Kirke Corin, pt's stress test was negative. He is cleared for surgery. [facility] to [facility] to [facility] for [facility].  I will call to inform pt. Pt informed. Understanding verb.

## 2012-04-23 ENCOUNTER — Encounter (HOSPITAL_BASED_OUTPATIENT_CLINIC_OR_DEPARTMENT_OTHER)
Admission: RE | Disposition: A | Discharge status: Home / Self Care | Payer: Self-pay | Source: Ambulatory Visit | Attending: Orthopedic Surgery

## 2012-04-23 ENCOUNTER — Ambulatory Visit (HOSPITAL_BASED_OUTPATIENT_CLINIC_OR_DEPARTMENT_OTHER)
Admission: RE | Admit: 2012-04-23 | Discharge: 2012-04-23 | Disposition: A | Discharge status: Home / Self Care | Payer: 59 | Source: Ambulatory Visit | Attending: Orthopedic Surgery | Admitting: Orthopedic Surgery

## 2012-04-23 ENCOUNTER — Encounter (HOSPITAL_BASED_OUTPATIENT_CLINIC_OR_DEPARTMENT_OTHER): Payer: Self-pay | Admitting: Anesthesiology

## 2012-04-23 ENCOUNTER — Ambulatory Visit (HOSPITAL_BASED_OUTPATIENT_CLINIC_OR_DEPARTMENT_OTHER): Payer: 59 | Admitting: Anesthesiology

## 2012-04-23 ENCOUNTER — Encounter (HOSPITAL_BASED_OUTPATIENT_CLINIC_OR_DEPARTMENT_OTHER): Payer: Self-pay | Admitting: *Deleted

## 2012-04-23 DIAGNOSIS — I10 Essential (primary) hypertension: Secondary | ICD-10-CM | POA: Insufficient documentation

## 2012-04-23 DIAGNOSIS — Z01812 Encounter for preprocedural laboratory examination: Secondary | ICD-10-CM | POA: Insufficient documentation

## 2012-04-23 DIAGNOSIS — G5600 Carpal tunnel syndrome, unspecified upper limb: Secondary | ICD-10-CM | POA: Insufficient documentation

## 2012-04-23 DIAGNOSIS — Z01810 Encounter for preprocedural cardiovascular examination: Secondary | ICD-10-CM | POA: Insufficient documentation

## 2012-04-23 DIAGNOSIS — K219 Gastro-esophageal reflux disease without esophagitis: Secondary | ICD-10-CM | POA: Insufficient documentation

## 2012-04-23 HISTORY — DX: Gastro-esophageal reflux disease without esophagitis: K21.9

## 2012-04-23 HISTORY — DX: Carpal tunnel syndrome, bilateral upper limbs: G56.03

## 2012-04-23 HISTORY — DX: Hyperlipidemia, unspecified: E78.5

## 2012-04-23 HISTORY — PX: CARPAL TUNNEL RELEASE: SHX101

## 2012-04-23 HISTORY — DX: Unspecified osteoarthritis, unspecified site: M19.90

## 2012-04-23 HISTORY — DX: Essential (primary) hypertension: I10

## 2012-04-23 LAB — POCT HEMOGLOBIN-HEMACUE: Hemoglobin: 13.5 g/dL (ref 13.0–17.0)

## 2012-04-23 SURGERY — CARPAL TUNNEL RELEASE
Anesthesia: General | Site: Hand | Laterality: Left | Wound class: Clean

## 2012-04-23 MED ORDER — MIDAZOLAM HCL 5 MG/5ML IJ SOLN
INTRAMUSCULAR | Status: DC | PRN
  Administered 2012-04-23: 2 mg via INTRAVENOUS

## 2012-04-23 MED ORDER — LACTATED RINGERS IV SOLN
INTRAVENOUS | Status: DC | PRN
  Administered 2012-04-23: 10:00:00 via INTRAVENOUS

## 2012-04-23 MED ORDER — CHLORHEXIDINE GLUCONATE 4 % EX LIQD: 60.0000 mL | Freq: Once | CUTANEOUS | Status: DC

## 2012-04-23 MED ORDER — METOCLOPRAMIDE HCL 5 MG/ML IJ SOLN: 10.0000 mg | Freq: Once | INTRAMUSCULAR | Status: DC | PRN

## 2012-04-23 MED ORDER — PROPOFOL 10 MG/ML IV EMUL
INTRAVENOUS | Status: DC | PRN
  Administered 2012-04-23: 250 mg via INTRAVENOUS

## 2012-04-23 MED ORDER — LIDOCAINE HCL 2 % IJ SOLN
INTRAMUSCULAR | Status: DC | PRN
  Administered 2012-04-23: 3.5 mL

## 2012-04-23 MED ORDER — OXYCODONE HCL 5 MG/5ML PO SOLN: 5.0000 mg | Freq: Once | ORAL | Status: AC | PRN

## 2012-04-23 MED ORDER — LACTATED RINGERS IV SOLN
INTRAVENOUS | Status: DC
  Administered 2012-04-23: 10:00:00 via INTRAVENOUS

## 2012-04-23 MED ORDER — FENTANYL CITRATE 0.05 MG/ML IJ SOLN
INTRAMUSCULAR | Status: DC | PRN
  Administered 2012-04-23 (×2): 50 ug via INTRAVENOUS

## 2012-04-23 MED ORDER — DEXAMETHASONE SODIUM PHOSPHATE 4 MG/ML IJ SOLN
INTRAMUSCULAR | Status: DC | PRN
  Administered 2012-04-23: 10 mg via INTRAVENOUS

## 2012-04-23 MED ORDER — OXYCODONE-ACETAMINOPHEN 5-325 MG PO TABS: ORAL_TABLET | ORAL | Status: DC

## 2012-04-23 MED ORDER — HYDROMORPHONE HCL PF 1 MG/ML IJ SOLN: 0.2500 mg | INTRAMUSCULAR | Status: DC | PRN

## 2012-04-23 MED ORDER — OXYCODONE HCL 5 MG PO TABS
5.0000 mg | ORAL_TABLET | Freq: Once | ORAL | Status: AC | PRN
  Administered 2012-04-23: 5 mg via ORAL

## 2012-04-23 MED ORDER — LIDOCAINE HCL (CARDIAC) 20 MG/ML IV SOLN
INTRAVENOUS | Status: DC | PRN
  Administered 2012-04-23: 50 mg via INTRAVENOUS

## 2012-04-23 MED ORDER — ONDANSETRON HCL 4 MG/2ML IJ SOLN
INTRAMUSCULAR | Status: DC | PRN
  Administered 2012-04-23: 4 mg via INTRAVENOUS

## 2012-04-23 SURGICAL SUPPLY — 39 items
BANDAGE ADHESIVE 1X3 (GAUZE/BANDAGES/DRESSINGS) IMPLANT
BANDAGE ELASTIC 3 VELCRO ST LF (GAUZE/BANDAGES/DRESSINGS) ×2 IMPLANT
BLADE SURG 15 STRL LF DISP TIS (BLADE) ×1 IMPLANT
BLADE SURG 15 STRL SS (BLADE) ×1
BNDG ESMARK 4X9 LF (GAUZE/BANDAGES/DRESSINGS) ×2 IMPLANT
BRUSH SCRUB EZ PLAIN DRY (MISCELLANEOUS) ×2 IMPLANT
CLOTH BEACON ORANGE TIMEOUT ST (SAFETY) ×2 IMPLANT
CORDS BIPOLAR (ELECTRODE) IMPLANT
COVER MAYO STAND STRL (DRAPES) ×2 IMPLANT
COVER TABLE BACK 60X90 (DRAPES) ×2 IMPLANT
CUFF TOURNIQUET SINGLE 18IN (TOURNIQUET CUFF) ×2 IMPLANT
DECANTER SPIKE VIAL GLASS SM (MISCELLANEOUS) IMPLANT
DRAPE EXTREMITY T 121X128X90 (DRAPE) ×2 IMPLANT
DRAPE SURG 17X23 STRL (DRAPES) ×2 IMPLANT
GLOVE BIO SURGEON STRL SZ 6.5 (GLOVE) ×2 IMPLANT
GLOVE BIOGEL M STRL SZ7.5 (GLOVE) ×2 IMPLANT
GLOVE BIOGEL PI IND STRL 7.0 (GLOVE) ×1 IMPLANT
GLOVE BIOGEL PI INDICATOR 7.0 (GLOVE) ×1
GLOVE EXAM NITRILE EXT CUFF MD (GLOVE) ×2 IMPLANT
GLOVE ORTHO TXT STRL SZ7.5 (GLOVE) ×2 IMPLANT
GOWN PREVENTION PLUS XLARGE (GOWN DISPOSABLE) ×2 IMPLANT
GOWN PREVENTION PLUS XXLARGE (GOWN DISPOSABLE) ×4 IMPLANT
NEEDLE 27GAX1X1/2 (NEEDLE) IMPLANT
PACK BASIN DAY SURGERY FS (CUSTOM PROCEDURE TRAY) ×2 IMPLANT
PAD CAST 3X4 CTTN HI CHSV (CAST SUPPLIES) ×1 IMPLANT
PADDING CAST ABS 4INX4YD NS (CAST SUPPLIES) ×1
PADDING CAST ABS COTTON 4X4 ST (CAST SUPPLIES) ×1 IMPLANT
PADDING CAST COTTON 3X4 STRL (CAST SUPPLIES) ×1
SPLINT PLASTER CAST XFAST 3X15 (CAST SUPPLIES) ×5 IMPLANT
SPLINT PLASTER XTRA FASTSET 3X (CAST SUPPLIES) ×5
SPONGE GAUZE 4X4 12PLY (GAUZE/BANDAGES/DRESSINGS) ×2 IMPLANT
STOCKINETTE 4X48 STRL (DRAPES) ×2 IMPLANT
STRIP CLOSURE SKIN 1/2X4 (GAUZE/BANDAGES/DRESSINGS) ×2 IMPLANT
SUT PROLENE 3 0 PS 2 (SUTURE) ×2 IMPLANT
SYR 3ML 23GX1 SAFETY (SYRINGE) IMPLANT
SYR CONTROL 10ML LL (SYRINGE) IMPLANT
TRAY DSU PREP LF (CUSTOM PROCEDURE TRAY) ×2 IMPLANT
UNDERPAD 30X30 INCONTINENT (UNDERPADS AND DIAPERS) ×2 IMPLANT
WATER STERILE IRR 1000ML POUR (IV SOLUTION) ×2 IMPLANT

## 2012-04-23 NOTE — Anesthesia Preprocedure Evaluation (Signed)
Anesthesia Evaluation  Patient identified by MRN, date of birth, ID band Patient awake    Reviewed: Allergy & Precautions, H&P , NPO status , Patient's Chart, lab work & pertinent test results, reviewed documented beta blocker date and time   Airway Mallampati: II TM Distance: >3 FB Neck ROM: full    Dental   Pulmonary shortness of breath and with exertion,  breath sounds clear to auscultation        Cardiovascular hypertension, On Medications Rhythm:regular     Neuro/Psych  Neuromuscular disease negative psych ROS   GI/Hepatic Neg liver ROS, GERD-  Medicated and Controlled,  Endo/Other  Morbid obesity  Renal/GU negative Renal ROS  negative genitourinary   Musculoskeletal   Abdominal   Peds  Hematology negative hematology ROS (+)   Anesthesia Other Findings See surgeon's H&P   Reproductive/Obstetrics negative OB ROS                           Anesthesia Physical Anesthesia Plan  ASA: III  Anesthesia Plan: General   Post-op Pain Management:    Induction: Intravenous  Airway Management Planned: LMA  Additional Equipment:   Intra-op Plan:   Post-operative Plan: Extubation in OR  Informed Consent: I have reviewed the patients History and Physical, chart, labs and discussed the procedure including the risks, benefits and alternatives for the proposed anesthesia with the patient or authorized representative who has indicated his/her understanding and acceptance.   Dental Advisory Given  Plan Discussed with: CRNA and Surgeon  Anesthesia Plan Comments:         Anesthesia Quick Evaluation

## 2012-04-23 NOTE — Op Note (Signed)
[id number] 

## 2012-04-23 NOTE — Brief Op Note (Signed)
04/23/2012  12:00 PM  PATIENT:  Colin [name]  61 y.o. male  PRE-OPERATIVE DIAGNOSIS:  LEFT CARPAL TUNNEL SYNDROME  POST-OPERATIVE DIAGNOSIS:  LEFT CARPAL TUNNEL SYNDROME  PROCEDURE:  Procedure(s) (LRB): CARPAL TUNNEL RELEASE (Left)  SURGEON:  Surgeon(s) and Role:    * Wyn Forster., MD - Primary  PHYSICIAN ASSISTANT:   ASSISTANTS: Mallory Shirk.A-C   ANESTHESIA:   general  EBL:  Total I/O In: 400 [I.V.:400] Out: -   BLOOD ADMINISTERED:none  DRAINS: none   LOCAL MEDICATIONS USED:  XYLOCAINE   SPECIMEN:  No Specimen  DISPOSITION OF SPECIMEN:  N/A  COUNTS:  YES  TOURNIQUET:  * Missing tourniquet times found for documented tourniquets in log:  [id number] *  DICTATION: .Other Dictation: Dictation Number [id number]  PLAN OF CARE: Discharge to home after PACU  PATIENT DISPOSITION:  PACU - hemodynamically stable.

## 2012-04-23 NOTE — Anesthesia Preprocedure Evaluation (Deleted)
Anesthesia Evaluation Anesthesia Physical Anesthesia Plan Anesthesia Quick Evaluation  

## 2012-04-23 NOTE — Anesthesia Postprocedure Evaluation (Signed)
Anesthesia Post Note  Patient: Colin [name]  Procedure(s) Performed: Procedure(s) (LRB): CARPAL TUNNEL RELEASE (Left)  Anesthesia type: MAC  Patient location: PACU  Post pain: Pain level controlled  Post assessment: Patient's Cardiovascular Status Stable  Last Vitals:  Filed Vitals:   04/23/12 1252  BP: 147/82  Pulse: 60  Temp: 36.4 C  Resp: 16    Post vital signs: Reviewed and stable  Level of consciousness: alert  Complications: No apparent anesthesia complications

## 2012-04-23 NOTE — Transfer of Care (Signed)
Immediate Anesthesia Transfer of Care Note  Patient: Colin [name]  Procedure(s) Performed: Procedure(s) (LRB): CARPAL TUNNEL RELEASE (Left)  Patient Location: PACU  Anesthesia Type: General  Level of Consciousness: sedated  Airway & Oxygen Therapy: Patient Spontanous Breathing and Patient connected to face mask oxygen  Post-op Assessment: Report given to PACU RN and Post -op Vital signs reviewed and stable  Post vital signs: Reviewed and stable  Complications: No apparent anesthesia complications

## 2012-04-23 NOTE — Anesthesia Procedure Notes (Signed)
Procedure Name: LMA Insertion Date/Time: 04/23/2012 11:42 AM Performed by: Caren Macadam Pre-anesthesia Checklist: Patient identified, Emergency Drugs available, Suction available and Patient being monitored Patient Re-evaluated:Patient Re-evaluated prior to inductionOxygen Delivery Method: Circle System Utilized Preoxygenation: Pre-oxygenation with 100% oxygen Intubation Type: IV induction Ventilation: Mask ventilation without difficulty LMA: LMA with gastric port inserted LMA Size: 5.0 Number of attempts: 1 Airway Equipment and Method: bite block Placement Confirmation: positive ETCO2 and breath sounds checked- equal and bilateral Tube secured with: Tape Dental Injury: Teeth and Oropharynx as per pre-operative assessment

## 2012-04-24 ENCOUNTER — Encounter (HOSPITAL_BASED_OUTPATIENT_CLINIC_OR_DEPARTMENT_OTHER): Payer: Self-pay | Admitting: Orthopedic Surgery

## 2012-04-24 NOTE — Op Note (Signed)
Colin Kitchen  [name], Colin [name] NO.:  [id number]  MEDICAL RECORD NO.:  [id number]  LOCATION:                                 FACILITY:  PHYSICIAN:  Katy Fitch. Rovena Hearld, M.D.      DATE OF BIRTH:  DATE OF PROCEDURE:  04/23/2012 DATE OF DISCHARGE:                              OPERATIVE REPORT   PREOPERATIVE DIAGNOSIS:  Severe left carpal tunnel syndrome.  POSTOPERATIVE DIAGNOSIS:  Severe left carpal tunnel syndrome.  OPERATION:  Release of left transverse carpal ligament.  OPERATING SURGEON:  Katy Fitch. Niva Murren, MD  ASSISTANT:  Marveen Reeks Dasnoit, PA-C  ANESTHESIA:  General by LMA.  SUPERVISING ANESTHESIOLOGIST:  Janetta Hora. Gelene Mink, M.D.  INDICATION:  [name] is a 61 year old gentleman who is referred through the courtesy of Dr. Azucena Cecil for evaluation and management of hand numbness.  Clinical examination suggest a significant carpal tunnel syndrome.  Electrodiagnostic studies were obtained confirming very severe bilateral carpal tunnel syndrome.  Due to failure to respond to nonoperative measures, he is brought to the operating room at this time for release of his left transverse carpal ligament.  Preoperatively, he was reminded of the potential risks and benefits of surgery.  Questions were invited and answered in detail.  PROCEDURE:  [name] was brought to room #2 of the [facility] and placed in supine position on the operating table.  Following induction of general anesthesia by LMA technique, the left arm was prepped with Betadine soap and solution, and sterilely draped.  A pneumatic tourniquet was applied to the proximal left brachium.  Following exsanguination of the left arm with Esmarch bandage, the arterial tourniquet was inflated to 220 mmHg.  A routine surgical time- out was accomplished followed by creation of a 15-mm incision in the line of the ring finger of the palm.  Subcutaneous tissues were carefully divided revealing the  palmar fascia.  This was split longitudinally to reveal the common sensory branch of the median nerve. The carpal canal was sounded with a Insurance risk surveyor.  The elevator was used to separate the median nerve from the deep surface of the transverse carpal ligament.  Scissors were then used to release the ligament along its ulnar border.  This widely opened the carpal canal. No masses of the predicaments were noted.  Bleeding points along the margin of the released ligament were electrocauterized with bipolar current. The wound was then repaired with intradermal 3-0 Prolene suture.  A compressive dressing was applied with a volar plaster splint maintaining the wrist in 15 degrees dorsiflexion.     Katy Fitch Abi Shoults, M.D.     RVS/MEDQ  D:  04/23/2012  T:  04/24/2012  Job:  [id number]

## 2012-06-20 ENCOUNTER — Other Ambulatory Visit: Payer: Self-pay | Admitting: Orthopedic Surgery

## 2012-06-25 ENCOUNTER — Ambulatory Visit (HOSPITAL_BASED_OUTPATIENT_CLINIC_OR_DEPARTMENT_OTHER)
Admission: RE | Admit: 2012-06-25 | Discharge: 2012-06-25 | Disposition: A | Discharge status: Home / Self Care | Payer: 59 | Source: Ambulatory Visit | Attending: Orthopedic Surgery | Admitting: Orthopedic Surgery

## 2012-06-25 LAB — BASIC METABOLIC PANEL
BUN: 16 mg/dL (ref 6–23)
CO2: 27 mEq/L (ref 19–32)
Calcium: 10.2 mg/dL (ref 8.4–10.5)
Chloride: 103 mEq/L (ref 96–112)
Creatinine, Ser: 0.96 mg/dL (ref 0.50–1.35)
GFR calc Af Amer: >90 mL/min (ref >90)
GFR calc non Af Amer: 89 mL/min — ABNORMAL LOW (ref >90)
Glucose, Bld: 86 mg/dL (ref 70–99)
Potassium: 4 mEq/L (ref 3.5–5.1)
Sodium: 138 mEq/L (ref 135–145)

## 2012-06-25 NOTE — Progress Notes (Signed)
Pt here 8/13 for lt ctr-did well

## 2012-06-26 NOTE — H&P (Signed)
Colin [name] is an 61 y.o. male.   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  He enjoys playing classical guitar.  His hands have gotten numb to the point where he can no long effectively play his guitar.  He has had significant symptoms that did not respond to splinting as far back as 2009.  He had electrodiagnostic studies  performed at [facility], on the left side and was told he had significant carpal tunnel syndrome.  He did not follow-up with further treatment at that time. He has also had prior anterior cervical procedure for degenerative disc disease with an anterior fusion performed by Dr. Jeral Fruit.  He states that the procedure did not affect his hand numbness.     Past Medical History  Diagnosis Date  . Shortness of breath   . Carpal tunnel syndrome, bilateral   . GERD (gastroesophageal reflux disease)   . Hyperlipemia   . Arthritis   . Cancer     skin cancer  . Hypertension     Past Surgical History  Procedure Date  . Cervical discectomy 2007  . Tonsillectomy   . Joint replacement 81,87    lt total hip  . Laparoscopic appendectomy 2010    required open repair  . Incisional hernia repair 2011    subcostal from append  . Total hip arthroplasty     x2 left hip  . Carpal tunnel release 04/23/2012    Procedure: CARPAL TUNNEL RELEASE;  Surgeon: Wyn Forster., MD;  Location: [facility];  Service: Orthopedics;  Laterality: Left;    No family history on file. Social History:  reports that he has never smoked. He does not have any smokeless tobacco history on file. He reports that he drinks alcohol. His drug history not on file.  Allergies: No Known Allergies  No prescriptions prior to admission    Results for orders placed during the hospital encounter  of 06/27/12 (from the past 48 hour(s))  BASIC METABOLIC PANEL     Status: Abnormal   Collection Time   06/25/12 12:00 PM      Component Value Range Comment   Sodium 138  135 - 145 mEq/L    Potassium 4.0  3.5 - 5.1 mEq/L    Chloride 103  96 - 112 mEq/L    CO2 27  19 - 32 mEq/L    Glucose, Bld 86  70 - 99 mg/dL    BUN 16  6 - 23 mg/dL    Creatinine, Ser 1.61  0.50 - 1.35 mg/dL    Calcium 09.6  8.4 - 10.5 mg/dL    GFR calc non Af Amer 89 (*) >90 mL/min    GFR calc Af Amer >90  >90 mL/min     No results found.   Pertinent items are noted in HPI.  There were no vitals taken for this visit.  General appearance: alert Head: Normocephalic, without obvious abnormality Neck: no JVD and supple, symmetrical, trachea midline Resp: clear to auscultation bilaterally Cardio: regular rate and rhythm GI: normal findings: bowel sounds normal Extremities:Inspection of his hands reveals no thenar atrophy.  He has diminished sweat patterns bilaterally in the median distribution.  He has no sign of stenosing tenosynovitis.  Pulse and cap refill are intact.  He does not  have thenar atrophy right or left. He notes that overall his degree of sensibility has substantially decreased in the past three months.    Dr. Johna Roles performed detailed electrodiagnostic studies. These confirm severe bilateral carpal tunnel syndrome.    Pulses: 2+ and symmetric Skin: normal Neurologic: Grossly normal    Assessment/Plan  Impression: Right CTS  Plan: To the OR for right CTR.The procedure, risks,benefits and post-op course were discussed with the patient at length and they were in agreement with the plan.   DASNOIT,Colin [name] 06/26/2012, 9:47 PM     H&P documentation: 06/27/2012  -History and Physical Reviewed  -Patient has been re-examined  -No change in the plan of care  Wyn Forster, MD

## 2012-06-27 ENCOUNTER — Encounter (HOSPITAL_BASED_OUTPATIENT_CLINIC_OR_DEPARTMENT_OTHER)
Admission: RE | Disposition: A | Discharge status: Home / Self Care | Payer: Self-pay | Source: Ambulatory Visit | Attending: Orthopedic Surgery

## 2012-06-27 ENCOUNTER — Encounter (HOSPITAL_BASED_OUTPATIENT_CLINIC_OR_DEPARTMENT_OTHER): Payer: Self-pay | Admitting: Anesthesiology

## 2012-06-27 ENCOUNTER — Ambulatory Visit (HOSPITAL_BASED_OUTPATIENT_CLINIC_OR_DEPARTMENT_OTHER): Payer: 59 | Admitting: Anesthesiology

## 2012-06-27 ENCOUNTER — Ambulatory Visit (HOSPITAL_BASED_OUTPATIENT_CLINIC_OR_DEPARTMENT_OTHER)
Admission: RE | Admit: 2012-06-27 | Discharge: 2012-06-27 | Disposition: A | Discharge status: Home / Self Care | Payer: 59 | Source: Ambulatory Visit | Attending: Orthopedic Surgery | Admitting: Orthopedic Surgery

## 2012-06-27 ENCOUNTER — Encounter (HOSPITAL_BASED_OUTPATIENT_CLINIC_OR_DEPARTMENT_OTHER): Payer: Self-pay | Admitting: *Deleted

## 2012-06-27 DIAGNOSIS — G5600 Carpal tunnel syndrome, unspecified upper limb: Secondary | ICD-10-CM | POA: Insufficient documentation

## 2012-06-27 DIAGNOSIS — Z01812 Encounter for preprocedural laboratory examination: Secondary | ICD-10-CM | POA: Insufficient documentation

## 2012-06-27 DIAGNOSIS — E785 Hyperlipidemia, unspecified: Secondary | ICD-10-CM | POA: Insufficient documentation

## 2012-06-27 DIAGNOSIS — K219 Gastro-esophageal reflux disease without esophagitis: Secondary | ICD-10-CM | POA: Insufficient documentation

## 2012-06-27 DIAGNOSIS — I10 Essential (primary) hypertension: Secondary | ICD-10-CM | POA: Insufficient documentation

## 2012-06-27 HISTORY — PX: CARPAL TUNNEL RELEASE: SHX101

## 2012-06-27 SURGERY — CARPAL TUNNEL RELEASE
Anesthesia: General | Site: Wrist | Laterality: Right | Wound class: Clean

## 2012-06-27 MED ORDER — FENTANYL CITRATE 0.05 MG/ML IJ SOLN: INTRAMUSCULAR | Status: DC | PRN

## 2012-06-27 MED ORDER — CHLORHEXIDINE GLUCONATE 4 % EX LIQD
60.0000 mL | Freq: Once | CUTANEOUS | Status: AC
  Administered 2012-06-27: 4 via TOPICAL

## 2012-06-27 MED ORDER — LIDOCAINE HCL (CARDIAC) 20 MG/ML IV SOLN
INTRAVENOUS | Status: DC | PRN
  Administered 2012-06-27: 50 mg via INTRAVENOUS

## 2012-06-27 MED ORDER — MIDAZOLAM HCL 5 MG/5ML IJ SOLN
INTRAMUSCULAR | Status: DC | PRN
  Administered 2012-06-27: 2 mg via INTRAVENOUS

## 2012-06-27 MED ORDER — LIDOCAINE HCL 2 % IJ SOLN
INTRAMUSCULAR | Status: DC | PRN
  Administered 2012-06-27: 4 mL

## 2012-06-27 MED ORDER — PROPOFOL 10 MG/ML IV BOLUS
INTRAVENOUS | Status: DC | PRN
  Administered 2012-06-27: 50 mg via INTRAVENOUS
  Administered 2012-06-27: 200 mg via INTRAVENOUS

## 2012-06-27 MED ORDER — FENTANYL CITRATE 0.05 MG/ML IJ SOLN
INTRAMUSCULAR | Status: DC | PRN
  Administered 2012-06-27 (×2): 50 ug via INTRAVENOUS

## 2012-06-27 MED ORDER — LACTATED RINGERS IV SOLN
INTRAVENOUS | Status: DC
  Administered 2012-06-27 (×2): via INTRAVENOUS

## 2012-06-27 MED ORDER — ONDANSETRON HCL 4 MG/2ML IJ SOLN
INTRAMUSCULAR | Status: DC | PRN
  Administered 2012-06-27: 4 mg via INTRAVENOUS

## 2012-06-27 MED ORDER — DEXAMETHASONE SODIUM PHOSPHATE 4 MG/ML IJ SOLN
INTRAMUSCULAR | Status: DC | PRN
  Administered 2012-06-27: 10 mg via INTRAVENOUS

## 2012-06-27 SURGICAL SUPPLY — 39 items
BANDAGE ADHESIVE 1X3 (GAUZE/BANDAGES/DRESSINGS) IMPLANT
BANDAGE ELASTIC 3 VELCRO ST LF (GAUZE/BANDAGES/DRESSINGS) ×2 IMPLANT
BLADE SURG 15 STRL LF DISP TIS (BLADE) ×1 IMPLANT
BLADE SURG 15 STRL SS (BLADE) ×1
BNDG ESMARK 4X9 LF (GAUZE/BANDAGES/DRESSINGS) ×2 IMPLANT
BRUSH SCRUB EZ PLAIN DRY (MISCELLANEOUS) ×2 IMPLANT
CLOTH BEACON ORANGE TIMEOUT ST (SAFETY) ×2 IMPLANT
CORDS BIPOLAR (ELECTRODE) ×2 IMPLANT
COVER MAYO STAND STRL (DRAPES) ×2 IMPLANT
COVER TABLE BACK 60X90 (DRAPES) ×2 IMPLANT
CUFF TOURNIQUET SINGLE 18IN (TOURNIQUET CUFF) ×2 IMPLANT
DECANTER SPIKE VIAL GLASS SM (MISCELLANEOUS) IMPLANT
DRAPE EXTREMITY T 121X128X90 (DRAPE) ×2 IMPLANT
DRAPE SURG 17X23 STRL (DRAPES) ×2 IMPLANT
GLOVE BIO SURGEON STRL SZ 6.5 (GLOVE) ×2 IMPLANT
GLOVE BIOGEL M STRL SZ7.5 (GLOVE) ×2 IMPLANT
GLOVE EXAM NITRILE EXT CUFF MD (GLOVE) ×2 IMPLANT
GLOVE ORTHO TXT STRL SZ7.5 (GLOVE) ×2 IMPLANT
GOWN BRE IMP PREV XXLGXLNG (GOWN DISPOSABLE) ×4 IMPLANT
GOWN PREVENTION PLUS XLARGE (GOWN DISPOSABLE) ×2 IMPLANT
NDL SAFETY ECLIPSE 18X1.5 (NEEDLE) ×1 IMPLANT
NEEDLE 27GAX1X1/2 (NEEDLE) ×2 IMPLANT
NEEDLE HYPO 18GX1.5 SHARP (NEEDLE) ×1
PACK BASIN DAY SURGERY FS (CUSTOM PROCEDURE TRAY) ×2 IMPLANT
PAD CAST 3X4 CTTN HI CHSV (CAST SUPPLIES) ×1 IMPLANT
PADDING CAST ABS 4INX4YD NS (CAST SUPPLIES) ×1
PADDING CAST ABS COTTON 4X4 ST (CAST SUPPLIES) ×1 IMPLANT
PADDING CAST COTTON 3X4 STRL (CAST SUPPLIES) ×1
SPLINT PLASTER CAST XFAST 3X15 (CAST SUPPLIES) ×5 IMPLANT
SPLINT PLASTER XTRA FASTSET 3X (CAST SUPPLIES) ×5
SPONGE GAUZE 4X4 12PLY (GAUZE/BANDAGES/DRESSINGS) IMPLANT
STOCKINETTE 4X48 STRL (DRAPES) ×2 IMPLANT
STRIP CLOSURE SKIN 1/2X4 (GAUZE/BANDAGES/DRESSINGS) ×2 IMPLANT
SUT PROLENE 3 0 PS 2 (SUTURE) ×2 IMPLANT
SYR 3ML 23GX1 SAFETY (SYRINGE) IMPLANT
SYR CONTROL 10ML LL (SYRINGE) ×2 IMPLANT
TRAY DSU PREP LF (CUSTOM PROCEDURE TRAY) ×2 IMPLANT
UNDERPAD 30X30 INCONTINENT (UNDERPADS AND DIAPERS) ×2 IMPLANT
WATER STERILE IRR 1000ML POUR (IV SOLUTION) IMPLANT

## 2012-06-27 NOTE — Anesthesia Procedure Notes (Signed)
Procedure Name: LMA Insertion Date/Time: 06/27/2012 11:45 AM Performed by: Burna Cash Pre-anesthesia Checklist: Patient identified, Emergency Drugs available, Suction available and Patient being monitored Patient Re-evaluated:Patient Re-evaluated prior to inductionOxygen Delivery Method: Circle System Utilized Preoxygenation: Pre-oxygenation with 100% oxygen Intubation Type: IV induction Ventilation: Mask ventilation without difficulty LMA: LMA inserted LMA Size: 5.0 Grade View: Grade I Number of attempts: 1 Airway Equipment and Method: bite block Placement Confirmation: positive ETCO2 Tube secured with: Tape Dental Injury: Teeth and Oropharynx as per pre-operative assessment

## 2012-06-27 NOTE — Brief Op Note (Signed)
06/27/2012  12:10 PM  PATIENT:  Colin [name]  61 y.o. male  PRE-OPERATIVE DIAGNOSIS:  RIGHT CARPAL TUNNEL SYNDROME  POST-OPERATIVE DIAGNOSIS:  RIGHT CARPAL TUNNEL SYNDROME  PROCEDURE:  Procedure(s) (LRB) with comments: CARPAL TUNNEL RELEASE (Right)  SURGEON:  Surgeon(s) and Role:    * Wyn Forster., MD - Primary  PHYSICIAN ASSISTANT:   ASSISTANTS: Mallory Shirk.A-C   ANESTHESIA:   general  EBL:  Total I/O In: 1300 [I.V.:1300] Out: -   BLOOD ADMINISTERED:none  DRAINS: none   LOCAL MEDICATIONS USED:  XYLOCAINE   SPECIMEN:  No Specimen  DISPOSITION OF SPECIMEN:  N/A  COUNTS:  YES  TOURNIQUET:   Total Tourniquet Time Documented: Upper Arm (Right) - 13 minutes  DICTATION: .Other Dictation: Dictation Number [id number]  PLAN OF CARE: Discharge to home after PACU  PATIENT DISPOSITION:  PACU - hemodynamically stable.

## 2012-06-27 NOTE — Anesthesia Preprocedure Evaluation (Addendum)
Anesthesia Evaluation  Patient identified by MRN, date of birth, ID band Patient awake    Reviewed: Allergy & Precautions, H&P , NPO status , Patient's Chart, lab work & pertinent test results  History of Anesthesia Complications Negative for: history of anesthetic complications  Airway Mallampati: I TM Distance: >3 FB Neck ROM: Full    Dental  (+) Teeth Intact and Dental Advisory Given   Pulmonary shortness of breath,  breath sounds clear to auscultation  Pulmonary exam normal       Cardiovascular hypertension, Pt. on medications Rhythm:Regular Rate:Normal  8/13 stress test: no ischemia, EF 55%   Neuro/Psych negative neurological ROS     GI/Hepatic Neg liver ROS, GERD-  Medicated and Controlled,  Endo/Other  Morbid obesity  Renal/GU negative Renal ROS     Musculoskeletal   Abdominal (+) + obese,   Peds  Hematology negative hematology ROS (+)   Anesthesia Other Findings   Reproductive/Obstetrics                          Anesthesia Physical Anesthesia Plan  ASA: II  Anesthesia Plan: General   Post-op Pain Management:    Induction: Intravenous  Airway Management Planned: LMA  Additional Equipment:   Intra-op Plan:   Post-operative Plan:   Informed Consent: I have reviewed the patients History and Physical, chart, labs and discussed the procedure including the risks, benefits and alternatives for the proposed anesthesia with the patient or authorized representative who has indicated his/her understanding and acceptance.   Dental advisory given  Plan Discussed with: CRNA and Surgeon  Anesthesia Plan Comments: (Plan routine monitors, GA- LMA OK)        Anesthesia Quick Evaluation

## 2012-06-27 NOTE — Op Note (Signed)
[id number]  

## 2012-06-27 NOTE — Anesthesia Postprocedure Evaluation (Signed)
  Anesthesia Post-op Note  Patient: Colin [name]  Procedure(s) Performed: Procedure(s) (LRB) with comments: CARPAL TUNNEL RELEASE (Right)  Patient Location: PACU  Anesthesia Type: General  Level of Consciousness: awake, alert , oriented and patient cooperative  Airway and Oxygen Therapy: Patient Spontanous Breathing  Post-op Pain: none  Post-op Assessment: Post-op Vital signs reviewed, Patient's Cardiovascular Status Stable, Respiratory Function Stable, Patent Airway, No signs of Nausea or vomiting, Adequate PO intake and Pain level controlled  Post-op Vital Signs: Reviewed and stable  Complications: No apparent anesthesia complications

## 2012-06-27 NOTE — Transfer of Care (Signed)
Immediate Anesthesia Transfer of Care Note  Patient: Colin [name]  Procedure(s) Performed: Procedure(s) (LRB) with comments: CARPAL TUNNEL RELEASE (Right)  Patient Location: PACU  Anesthesia Type: General  Level of Consciousness: sedated  Airway & Oxygen Therapy: Patient Spontanous Breathing and Patient connected to face mask oxygen  Post-op Assessment: Report given to PACU RN and Post -op Vital signs reviewed and stable  Post vital signs: Reviewed and stable  Complications: No apparent anesthesia complications

## 2012-06-28 ENCOUNTER — Encounter (HOSPITAL_BASED_OUTPATIENT_CLINIC_OR_DEPARTMENT_OTHER): Payer: Self-pay | Admitting: Orthopedic Surgery

## 2012-06-28 NOTE — Op Note (Signed)
[name] Kitchen  [name], [name] NO.:  [id number]  MEDICAL RECORD NO.:  [id number]  LOCATION:                                 FACILITY:  PHYSICIAN:  Katy Fitch. Brianny Soulliere, M.D. DATE OF BIRTH:  04-02-1951  DATE OF PROCEDURE:  06/27/2012 DATE OF DISCHARGE:                              OPERATIVE REPORT   PREOPERATIVE DIAGNOSIS:  Entrapment neuropathy, median nerve, right carpal tunnel.  POSTOPERATIVE DIAGNOSIS:  Entrapment neuropathy, median nerve, right carpal tunnel.  OPERATION:  Release of right transverse carpal ligament.  OPERATING SURGEON:  Katy Fitch. Daphnie Venturini, M.D.  ASSISTANT:  Marveen Reeks. Dasnoit, PA-C.  ANESTHESIA:  General by LMA.  SUPERVISING ANESTHESIOLOGIST:  Germaine Pomfret, M.D.  INDICATIONS:  Colin [name] is a 61 year old gentleman referred through the courtesy of Dr. Tally Joe for evaluation and management of bilateral hand numbness.  His history of electrodiagnostic studies confirmed carpal tunnel syndrome.  He has had excellent relief of his symptoms following a left transverse carpal ligament release.  He did not use pain medicine following surgery.  He has a full prescription from his initial surgery.  He is now scheduled for release of the right transverse carpal ligament.  Preoperatively, he was reminded of the risks and benefits of surgery.  Questions regarding his procedure and aftercare were discussed in detail in the holding area.  The right arm was marked with a surgical marking pen per protocol.  Questions were invited and answered in detail.  PROCEDURE:  Colin [name] was brought to room 1 of the [facility] and placed in a supine position on the operating table.  Following induction of general anesthesia by LMA technique, the right hand and arm were prepped with Betadine soap and solution, sterilely draped.  A pneumatic tourniquet was applied to the proximal left brachium.  Following exsanguination of the right arm  with Esmarch bandage, arterial tourniquet was inflated to 220 mmHg.  Following routine surgical time-out, the procedure commenced with short incision in the line of the ring finger in the palm.  Subcutaneous tissues were carefully divided in the palmar fascia.  This split longitudinally to the common sensory branch of median nerve.  These were followed back to the median nerve proper, which was gently isolated from the transverse carpal ligament with the aid of a [employer] 4 [employer], structural.  The transcarpal ligament was released along its ulnar border extending into the distal forearm.  This widely opened carpal canal.  No mass or other predicaments were noted.  Bleeding points along the margin of the skin and released transcarpal ligament were electrocauterized with bipolar current followed by repair of the skin with intradermal 3-0 Prolene suture.  Compressive dressing was applied with a volar plaster splint maintaining the wrist in 15 degrees of dorsiflexion.  For aftercare, Colin [name] already has a prescription for pain medication.  He will return to see [place] in the office in 7-8 days for dressing change, suture removal, and advancement to a postoperative exercise program.     Katy Fitch. Gibril Mastro, M.D.     RVS/MEDQ  D:  06/27/2012  T:  06/28/2012  Job:  [id number]  cc:   Tally Joe,  M.D. 

## 2012-08-14 ENCOUNTER — Encounter: Payer: Self-pay | Admitting: Pulmonary Disease

## 2012-08-14 ENCOUNTER — Ambulatory Visit (INDEPENDENT_AMBULATORY_CARE_PROVIDER_SITE_OTHER)
Admission: RE | Admit: 2012-08-14 | Discharge: 2012-08-14 | Disposition: A | Discharge status: Home / Self Care | Payer: 59 | Source: Ambulatory Visit | Attending: Pulmonary Disease | Admitting: Pulmonary Disease

## 2012-08-14 ENCOUNTER — Ambulatory Visit (INDEPENDENT_AMBULATORY_CARE_PROVIDER_SITE_OTHER): Payer: 59 | Admitting: Pulmonary Disease

## 2012-08-14 VITALS — BP 130/88 | HR 59 | Temp 98.1°F | Ht 73.0 in | Wt 266.8 lb

## 2012-08-14 DIAGNOSIS — R0989 Other specified symptoms and signs involving the circulatory and respiratory systems: Secondary | ICD-10-CM

## 2012-08-14 DIAGNOSIS — R0600 Dyspnea, unspecified: Secondary | ICD-10-CM

## 2012-08-14 DIAGNOSIS — R0609 Other forms of dyspnea: Secondary | ICD-10-CM

## 2012-08-14 NOTE — Patient Instructions (Addendum)
Will check chest xray today, and will call you with results. Will schedule for breathing studies for next week either here or at [facility].  Will arrange followup once these are available.  Work on weight loss and conditioning.

## 2012-08-14 NOTE — Assessment & Plan Note (Signed)
The pt has doe that has been slowly progressive over the last few yrs.  There is nothing by history to suggest a pulmonary issue, and his lungs are clear today.  Will check cxr, and arrange for PFT's for evaluation.  If these are unremarkable, will need to consider further workup vs giving him some time to work on weight loss and conditioning.

## 2012-08-14 NOTE — Progress Notes (Signed)
  Subjective:    Patient ID: Colin [name], male    DOB: 07-25-1951, 61 y.o.   MRN: [id number]  HPI The patient is a 61 year old male who been asked to see for dyspnea on exertion.  He has had a 4 to five-year history of dyspnea that has been slowly progressive.  He describes shortness of breath bringing groceries in from the car or walking up one flight of stairs.  He believes that he has a 2-3 block dyspnea on exertion at a moderate pace on flat ground.  He also notices dyspnea with singing, and also if he bends over to pick something up.  However, he feels this is much less noticeable if he is exercising on a bike or brisk walking over a period of time.  He denies any significant cough or mucus production, and has not had any lower extremity edema.  He has no history of asthma, and has never smoked.  He has not had a recent chest x-ray or pulmonary function studies, but has had a recent nuclear stress test in August that was unremarkable.  He states his weight is up about 10 pounds over the last 2 years.   Review of Systems  Constitutional: Negative for fever and unexpected weight change.  HENT: Negative for ear pain, nosebleeds, congestion, sore throat, rhinorrhea, sneezing, trouble swallowing, dental problem, postnasal drip and sinus pressure.   Eyes: Negative for redness and itching.  Respiratory: Positive for cough ( occassional cough with phlegm) and shortness of breath ( with exertion, singing, bending over, urinating). Negative for chest tightness and wheezing.   Cardiovascular: Negative for palpitations and leg swelling.  Gastrointestinal: Negative for nausea and vomiting.  Genitourinary: Negative for dysuria.  Musculoskeletal: Positive for joint swelling and arthralgias.  Skin: Negative for rash.  Neurological: Negative for headaches.  Hematological: Does not bruise/bleed easily.  Psychiatric/Behavioral: Negative for dysphoric mood. The patient is not nervous/anxious.          Objective:   Physical Exam Constitutional:  Obese male, no acute distress  HENT:  Nares patent without discharge  Oropharynx without exudate, palate and uvula are normal  Eyes:  Perrla, eomi, no scleral icterus  Neck:  No JVD, no TMG  Cardiovascular:  Normal rate, regular rhythm, no rubs or gallops.  No murmurs        Intact distal pulses  Pulmonary :  Normal breath sounds, no stridor or respiratory distress   No rales, rhonchi, or wheezing  Abdominal:  Soft, nondistended, bowel sounds present.  No tenderness noted.   Musculoskeletal:  Minimal ankle edema noted.  Lymph Nodes:  No cervical lymphadenopathy noted  Skin:  No cyanosis noted  Neurologic:  Alert, appropriate, moves all 4 extremities without obvious deficit.         Assessment & Plan:

## 2012-08-19 ENCOUNTER — Encounter (HOSPITAL_COMMUNITY): Payer: 59

## 2012-08-23 ENCOUNTER — Other Ambulatory Visit: Payer: Self-pay | Admitting: Gastroenterology

## 2012-08-23 ENCOUNTER — Ambulatory Visit (HOSPITAL_COMMUNITY)
Admission: RE | Admit: 2012-08-23 | Discharge: 2012-08-23 | Disposition: A | Discharge status: Home / Self Care | Payer: 59 | Source: Ambulatory Visit | Attending: Pulmonary Disease | Admitting: Pulmonary Disease

## 2012-08-23 DIAGNOSIS — R062 Wheezing: Secondary | ICD-10-CM | POA: Insufficient documentation

## 2012-08-23 DIAGNOSIS — R0600 Dyspnea, unspecified: Secondary | ICD-10-CM

## 2012-08-23 DIAGNOSIS — R0989 Other specified symptoms and signs involving the circulatory and respiratory systems: Secondary | ICD-10-CM | POA: Insufficient documentation

## 2012-08-23 DIAGNOSIS — R05 Cough: Secondary | ICD-10-CM | POA: Insufficient documentation

## 2012-08-23 DIAGNOSIS — R0609 Other forms of dyspnea: Secondary | ICD-10-CM | POA: Insufficient documentation

## 2012-08-23 DIAGNOSIS — R059 Cough, unspecified: Secondary | ICD-10-CM | POA: Insufficient documentation

## 2012-08-23 MED ORDER — ALBUTEROL SULFATE (5 MG/ML) 0.5% IN NEBU
2.5000 mg | INHALATION_SOLUTION | Freq: Once | RESPIRATORY_TRACT | Status: AC
  Administered 2012-08-23: 2.5 mg via RESPIRATORY_TRACT

## 2012-09-03 ENCOUNTER — Telehealth: Payer: Self-pay | Admitting: Pulmonary Disease

## 2012-09-03 NOTE — Telephone Encounter (Signed)
Results have been requested from [facility].   LMOMTCB x1 Patient needs follow up with [facility] to review PFT results.  Results will be placed up front for appt once f/u appointment is made and [facility] has reviewed.

## 2012-09-03 NOTE — Telephone Encounter (Signed)
Pt needs ov to review studies.

## 2012-09-03 NOTE — Telephone Encounter (Signed)
Results have been received and placed in [facility] review folder.   Dr Shelle Iron, please advise. Thanks. Return to ashtyn once reviewed please. Thanks.

## 2012-09-03 NOTE — Telephone Encounter (Signed)
Colin [name] - Have you seen this pt's PFT results?  They were done at [facility] on 08-23-12.

## 2012-09-04 NOTE — Telephone Encounter (Signed)
Pt returned call.  Pt already set up an appt w/ [facility] for 12/19 @ 10:45 for his PFT results.  Pt states nothing further needed at this time.  Antionette Fairy

## 2012-09-04 NOTE — Telephone Encounter (Signed)
LMOMTCB x 2  Needs appt to review PFT.

## 2012-09-05 ENCOUNTER — Ambulatory Visit (INDEPENDENT_AMBULATORY_CARE_PROVIDER_SITE_OTHER): Payer: 59 | Admitting: Pulmonary Disease

## 2012-09-05 ENCOUNTER — Encounter: Payer: Self-pay | Admitting: Pulmonary Disease

## 2012-09-05 VITALS — BP 112/80 | HR 69 | Temp 98.5°F | Ht 73.0 in | Wt 268.8 lb

## 2012-09-05 DIAGNOSIS — R0600 Dyspnea, unspecified: Secondary | ICD-10-CM

## 2012-09-05 DIAGNOSIS — R0609 Other forms of dyspnea: Secondary | ICD-10-CM

## 2012-09-05 DIAGNOSIS — R0989 Other specified symptoms and signs involving the circulatory and respiratory systems: Secondary | ICD-10-CM

## 2012-09-05 NOTE — Patient Instructions (Addendum)
Work on weight loss and conditioning. If your breathing does not improve with the above, or if it worsens, please let us know.

## 2012-09-05 NOTE — Assessment & Plan Note (Signed)
The patient continues to have dyspnea on exertion, but has clear lung fields on exam, normal full pulmonary function studies, and a clear chest x-ray.  He has also had a cardiac stress test with no significant abnormality being found.  I had a long discussion with the patient about his symptoms, and more than likely this is related to weight and conditioning.  However, we can consider further workup with an echocardiogram, and cardiopulmonary stress test.  I suspect these will have low yield, but would be willing to proceed if the patient remains very concerned about his breathing.  I have outlined a conservative path with a trial of weight loss and conditioning, versus proceeding with further testing.  I have recommended the former, and the patient is agreeable to this.  However, he understands that if his symptoms do not resolve with weight loss and conditioning, or certainly if they worsen, he is to let us know.

## 2012-09-05 NOTE — Progress Notes (Signed)
  Subjective:    Patient ID: Collene Leyden, male    DOB: 08/19/1951, 61 y.o.   MRN: [id number]  HPI Patient comes in today for followup of his recent chest x-ray and pulmonary function studies, done as part of a workup for dyspnea on exertion.  The patient was found to have a clear chest x-ray, and his pulmonary function study showed no airflow obstruction, no restriction, and a normal diffusion capacity.  I have reviewed the study in detail with him, and answered all of his questions.   Review of Systems  Constitutional: Negative for fever and unexpected weight change.  HENT: Negative for ear pain, nosebleeds, congestion, sore throat, rhinorrhea, sneezing, trouble swallowing, dental problem, postnasal drip and sinus pressure.   Eyes: Negative for redness and itching.  Respiratory: Negative for cough, chest tightness, shortness of breath and wheezing.   Cardiovascular: Negative for palpitations and leg swelling.  Gastrointestinal: Negative for nausea and vomiting.  Genitourinary: Negative for dysuria.  Musculoskeletal: Positive for joint swelling and arthralgias.  Skin: Negative for rash.  Neurological: Negative for headaches.  Hematological: Does not bruise/bleed easily.  Psychiatric/Behavioral: Negative for dysphoric mood. The patient is not nervous/anxious.        Objective:   Physical Exam Overweight male in no acute distress Nose without purulence or discharge noted Neck without lymphadenopathy or thyromegaly Lower extremities without significant edema, no cyanosis Alert and oriented, moves all 4 extremities.       Assessment & Plan:

## 2016-02-18 ENCOUNTER — Other Ambulatory Visit: Payer: Self-pay | Admitting: Gastroenterology

## 2016-03-28 ENCOUNTER — Ambulatory Visit: Payer: Self-pay | Admitting: Orthopedic Surgery

## 2016-03-28 ENCOUNTER — Encounter (HOSPITAL_COMMUNITY)
Admission: RE | Admit: 2016-03-28 | Discharge: 2016-03-28 | Disposition: A | Discharge status: Home / Self Care | Payer: 59 | Source: Ambulatory Visit | Attending: Orthopedic Surgery | Admitting: Orthopedic Surgery

## 2016-03-28 ENCOUNTER — Encounter (HOSPITAL_COMMUNITY): Payer: Self-pay

## 2016-03-28 DIAGNOSIS — Z01812 Encounter for preprocedural laboratory examination: Secondary | ICD-10-CM | POA: Diagnosis present

## 2016-03-28 DIAGNOSIS — Z01810 Encounter for preprocedural cardiovascular examination: Secondary | ICD-10-CM | POA: Insufficient documentation

## 2016-03-28 HISTORY — DX: Hypothyroidism, unspecified: E03.9

## 2016-03-28 HISTORY — DX: Depression, unspecified: F32.A

## 2016-03-28 HISTORY — DX: Major depressive disorder, single episode, unspecified: F32.9

## 2016-03-28 HISTORY — DX: Reserved for inherently not codable concepts without codable children: IMO0001

## 2016-03-28 HISTORY — DX: Presence of spectacles and contact lenses: Z97.3

## 2016-03-28 LAB — BASIC METABOLIC PANEL
Anion gap: 6 (ref 5–15)
BUN: 19 mg/dL (ref 6–20)
CO2: 27 mmol/L (ref 22–32)
Calcium: 9.4 mg/dL (ref 8.9–10.3)
Chloride: 105 mmol/L (ref 101–111)
Creatinine, Ser: 0.95 mg/dL (ref 0.61–1.24)
GFR calc Af Amer: >60 mL/min (ref >60)
GFR calc non Af Amer: >60 mL/min (ref >60)
Glucose, Bld: 98 mg/dL (ref 65–99)
Potassium: 4.7 mmol/L (ref 3.5–5.1)
Sodium: 138 mmol/L (ref 135–145)

## 2016-03-28 LAB — CBC
HCT: 42.3 % (ref 39.0–52.0)
Hemoglobin: 14.1 g/dL (ref 13.0–17.0)
MCH: 30.3 pg (ref 26.0–34.0)
MCHC: 33.3 g/dL (ref 30.0–36.0)
MCV: 90.8 fL (ref 78.0–100.0)
Platelets: 191 10*3/uL (ref 150–400)
RBC: 4.66 MIL/uL (ref 4.22–5.81)
RDW: 13.8 % (ref 11.5–15.5)
WBC: 5 10*3/uL (ref 4.0–10.5)

## 2016-03-28 LAB — SURGICAL PCR SCREEN
MRSA, PCR: NEGATIVE
Staphylococcus aureus: NEGATIVE

## 2016-03-28 LAB — PROTIME-INR
INR: 0.96 (ref 0.00–1.49)
Prothrombin Time: 13 seconds (ref 11.6–15.2)

## 2016-03-28 LAB — ABO/RH: ABO/RH(D): O NEG

## 2016-03-28 NOTE — Patient Instructions (Addendum)
CHANDARA RORRER  03/28/2016   Your procedure is scheduled on: Wednesday April 05, 2016  Report to [facility]  Entrance take [place]  elevators to 3rd floor to  [place] at 5:30 AM.  Call this number if you have problems the morning of surgery [phone]   Remember: ONLY 1 PERSON MAY GO WITH YOU TO SHORT STAY TO GET  READY MORNING OF [place].  Do not eat food or drink liquids :After Midnight.     Take these medicines the morning of surgery with A SIP OF WATER: Omeprazole, Levothyroxine, Cetirizine (Zyrtec) if needed                                You may not have any metal on your body including hair pins and              piercings  Do not wear jewelry, lotions, powders or colognes, deodorant                           Men may shave face and neck.   Do not bring valuables to the hospital. [place].  Contacts, dentures or bridgework may not be worn into surgery.  Leave suitcase in the car. After surgery it may be brought to your room.                Please read over the following fact sheets you were given:MRSA INFORMATION SHEET; INCENTIVE SPIROMETER  _____________________________________________________________________             [facility] - Preparing for Surgery Before surgery, you can play an important role.  Because skin is not sterile, your skin needs to be as free of germs as possible.  You can reduce the number of germs on your skin by washing with CHG (chlorahexidine gluconate) soap before surgery.  CHG is an antiseptic cleaner which kills germs and bonds with the skin to continue killing germs even after washing. Please DO NOT use if you have an allergy to CHG or antibacterial soaps.  If your skin becomes reddened/irritated stop using the CHG and inform your nurse when you arrive at Short Stay. Do not shave (including legs and underarms) for at least 48 hours prior to the first CHG  shower.  You may shave your face/neck. Please follow these instructions carefully:  1.  Shower with CHG Soap the night before surgery and the  morning of Surgery.  2.  If you choose to wash your hair, wash your hair first as usual with your  normal  shampoo.  3.  After you shampoo, rinse your hair and body thoroughly to remove the  shampoo.                           4.  Use CHG as you would any other liquid soap.  You can apply chg directly  to the skin and wash                       Gently with a scrungie or clean washcloth.  5.  Apply the CHG Soap to your body ONLY  FROM THE NECK DOWN.   Do not use on face/ open                           Wound or open sores. Avoid contact with eyes, ears mouth and genitals (private parts).                       Wash face,  Genitals (private parts) with your normal soap.             6.  Wash thoroughly, paying special attention to the area where your surgery  will be performed.  7.  Thoroughly rinse your body with warm water from the neck down.  8.  DO NOT shower/wash with your normal soap after using and rinsing off  the CHG Soap.                9.  Pat yourself dry with a clean towel.            10.  Wear clean pajamas.            11.  Place clean sheets on your bed the night of your first shower and do not  sleep with pets. Day of Surgery : Do not apply any lotions/deodorants the morning of surgery.  Please wear clean clothes to the hospital/surgery center.  FAILURE TO FOLLOW THESE INSTRUCTIONS MAY RESULT IN THE CANCELLATION OF YOUR SURGERY PATIENT SIGNATURE_________________________________  NURSE SIGNATURE__________________________________  ________________________________________________________________________   [name]  An incentive spirometer is a tool that can help keep your lungs clear and active. This tool measures how well you are filling your lungs with each breath. Taking long deep breaths may help reverse or decrease the chance  of developing breathing (pulmonary) problems (especially infection) following:  A long period of time when you are unable to move or be active. BEFORE THE PROCEDURE   If the spirometer includes an indicator to show your best effort, your nurse or respiratory therapist will set it to a desired goal.  If possible, sit up straight or lean slightly forward. Try not to slouch.  Hold the incentive spirometer in an upright position. INSTRUCTIONS FOR USE  1. Sit on the edge of your bed if possible, or sit up as far as you can in bed or on a chair. 2. Hold the incentive spirometer in an upright position. 3. Breathe out normally. 4. Place the mouthpiece in your mouth and seal your lips tightly around it. 5. Breathe in slowly and as deeply as possible, raising the piston or the ball toward the top of the column. 6. Hold your breath for 3-5 seconds or for as long as possible. Allow the piston or ball to fall to the bottom of the column. 7. Remove the mouthpiece from your mouth and breathe out normally. 8. Rest for a few seconds and repeat Steps 1 through 7 at least 10 times every 1-2 hours when you are awake. Take your time and take a few normal breaths between deep breaths. 9. The spirometer may include an indicator to show your best effort. Use the indicator as a goal to work toward during each repetition. 10. After each set of 10 deep breaths, practice coughing to be sure your lungs are clear. If you have an incision (the cut made at the time of surgery), support your incision when coughing by placing a pillow or rolled up towels firmly against it. Once you  are able to get out of bed, walk around indoors and cough well. You may stop using the incentive spirometer when instructed by your caregiver.  RISKS AND COMPLICATIONS  Take your time so you do not get dizzy or light-headed.  If you are in pain, you may need to take or ask for pain medication before doing incentive spirometry. It is harder to  take a deep breath if you are having pain. AFTER USE  Rest and breathe slowly and easily.  It can be helpful to keep track of a log of your progress. Your caregiver can provide you with a simple table to help with this. If you are using the spirometer at home, follow these instructions: [place] IF:   You are having difficultly using the spirometer.  You have trouble using the spirometer as often as instructed.  Your pain medication is not giving enough relief while using the spirometer.  You develop fever of 100.5 F (38.1 C) or higher. SEEK IMMEDIATE MEDICAL CARE IF:   You cough up bloody sputum that had not been present before.  You develop fever of 102 F (38.9 C) or greater.  You develop worsening pain at or near the incision site. MAKE SURE YOU:   Understand these instructions.  Will watch your condition.  Will get help right away if you are not doing well or get worse. Document Released: 01/15/2007 Document Revised: 11/27/2011 Document Reviewed: 03/18/2007 [facility] Patient Information 2014 [place], [place].   ________________________________________________________________________

## 2016-03-28 NOTE — Progress Notes (Signed)
Your patient has screened at an elevated risk for Obstructive Sleep Apnea using the Stop-Bang Tool during a pre-surgical visit. Pt scored at high risk.

## 2016-03-28 NOTE — H&P (Signed)
[name] DOB: [birth year] Married / Language: Cleophus Molt / Race: White Male Date of Admission:  04/05/2016 CC:  Right Hip Pain History of Present Illness  The patient is a 65 year old male who comes in for a preoperative History and Physical. The patient is scheduled for a right total hip arthroplasty (anterior) to be performed by Dr. Dione Plover. Aluisio, MD at [facility] on 04/05/2016. The patient is a 66 year old male who presented for follow up of their hip. The patient is being followed for their bilateral (left THA revision; right hip osteoarthritis and total hip revision. Symptoms reported include: pain and aching. The patient feels that they are doing poorly and report their pain level to be moderate to severe. The following medication has been used for pain control: antiinflammatory medication (Ibuprophen, occasionally). He stated we have been following him along for his left hip for a few years now. He has had a revision hip replacement done and has an osteolytic area just below the lesser trochanter. He is not really having much pain or any difference in pain on the left side compared to even a year ago. Unfortunately, his right hip is getting progressively worse. Right hip is hurting with most all activities, even hurting at rest. It is starting to lose more mobility. It is starting to limit him more. He is now reached a point where he is ready to get it fixed. They have been treated conservatively in the past for the above stated problem and despite conservative measures, they continue to have progressive pain and severe functional limitations and dysfunction. They have failed non-operative management including home exercise, medications. It is felt that they would benefit from undergoing total joint replacement. Risks and benefits of the procedure have been discussed with the patient and they elect to proceed with surgery. There are no active contraindications to surgery such as ongoing  infection or rapidly progressive neurological disease.   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 Osteoarthritis  Father, Mother. Drug / Alcohol Addiction  Father. Cancer  Father, Mother, Sister. Cerebrovascular Accident  Mother.  Social History Not under pain contract  No history of drug/alcohol rehab  Number of flights of stairs before winded  2-3 Tobacco use  Never smoker. 08/02/2013 Tobacco / smoke exposure  08/02/2013: no Marital status  married Current drinker  08/02/2013: Currently drinks wine only occasionally per week Children  3 Current work status  working full time Living situation  live with spouse Exercise  Exercises weekly; does other [place] with wife following the Right THA-AA to be performed on 04/05/2016. Advance Directives  Healthcare POA  Medication History  Aspirin (81MG  Tablet, 1 (one) Oral) Active. Fish Oil Concentrate (1000MG  Capsule, 1 (one) Oral) Active. Aleve (220MG  Tablet, Oral as needed) Active. Losartan Potassium-HCTZ (50-12.5MG  Tablet, Oral) Active. Lovastatin (40MG  Tablet, Oral) Active. Omeprazole (40MG  Capsule DR, Oral) Active. Viagra (100MG  Tablet, Oral) Active.  Past Surgical History  Carpal Tunnel Repair  Date: 2013. bilateral Neck Disc Surgery  Total Hip Replacement  Date: 83. left Revison Left Total Hip  Date: 69. Revision Left Total Hip  Date: 2013.  Appendectomy  Date: 2012. Hernia Repair  Date: 2012. Vasectomy    Review of Systems General Not  Present- Chills, Fatigue, Fever, Memory Loss, Night Sweats, Weight Gain and Weight Loss. Skin Not Present- Eczema, Hives, Itching, Lesions and Rash. HEENT Not Present- Dentures, Double Vision, Headache, Hearing Loss, Tinnitus and Visual Loss. Respiratory Present- Shortness of breath with exertion. Not Present- Allergies, Chronic Cough, Coughing up blood and Shortness of breath at rest. Cardiovascular Not Present- Chest Pain, Difficulty Breathing Lying Down, Murmur, Palpitations, Racing/skipping heartbeats and Swelling. Gastrointestinal Not Present- Abdominal Pain, Bloody Stool, Constipation, Diarrhea, Difficulty Swallowing, Heartburn, Jaundice, Loss of appetitie, Nausea and Vomiting. Male Genitourinary Not Present- Blood in Urine, Discharge, Flank Pain, Incontinence, Painful Urination, Urgency, Urinary frequency, Urinary Retention, Urinating at Night and Weak urinary stream. Musculoskeletal Present- Back Pain and Joint Pain. Not Present- Joint Swelling, Morning Stiffness, Muscle Pain, Muscle Weakness and Spasms. Neurological Not Present- Blackout spells, Difficulty with balance, Dizziness, Paralysis, Tremor and Weakness. Psychiatric Not Present- Insomnia.  Vitals  Weight: 260 lb Height: 73in Weight was reported by patient. Height was reported by patient. Body Surface Area: 2.41 m Body Mass Index: 34.3 kg/m  Pulse: 56 (Regular)  BP: 122/76 (Sitting, Right Arm, Standard)  Physical Exam General Mental Status -Alert, cooperative and good historian. General Appearance-pleasant, Not in acute distress. Orientation-Oriented X3. Build & Nutrition-Well nourished and Well developed.  Head and Neck Head-normocephalic, atraumatic . Neck Global Assessment - supple, no bruit auscultated on the right, no bruit auscultated on the left.  Eye Vision-Wears corrective lenses. Pupil - Bilateral-Regular and Round. Motion - Bilateral-EOMI.  Chest and Lung  Exam Auscultation Breath sounds - clear at anterior chest wall and clear at posterior chest wall. Adventitious sounds - No Adventitious sounds.  Cardiovascular Auscultation Rhythm - Regular rate and rhythm. Heart Sounds - S1 WNL and S2 WNL. Murmurs & Other Heart Sounds - Auscultation of the heart reveals - No Murmurs.  Abdomen Palpation/Percussion Tenderness - Abdomen is non-tender to palpation. Rigidity (guarding) - Abdomen is soft. Auscultation Auscultation of the abdomen reveals - Bowel sounds normal.  Male Genitourinary Note: Not done, not pertinent to present illness   Musculoskeletal Note: On exam, he is alert and oriented, in no apparent distress. His left hip can be flexed to about minimal internal rotation about 20, external rotation 20/30 of abduction. Right hip flexion 90, no internal rotation, only about 10 degrees of external rotation, 10 to 20 degrees of abduction. He walks with a significantly antalgic gait pattern really on both legs. He is slightly tender over his right greater trochanter. There is no left trochanteric tenderness. Pulse sensation and motor intact.  RADIOGRAPHS His radiographs, AP pelvis, AP and lateral of both hips showed severe bone-on-bone arthritis on the right hip with large osteophyte formation, no joint space left. Left hip shows the revision prosthesis in good position. There is no change in that osteolytic area just below the lesser trochanter compared to films of one year and two years ago. No evidence of any loosening of his component.   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 Antony Contras - Patient has been seen preoperatively and felt to be stable for surgery.  IV TXA  Anesthesia Issues: None  Signed electronically by Ok Edwards, III PA-C

## 2016-04-04 MED ORDER — DEXTROSE 5 % IV SOLN
3.0000 g | INTRAVENOUS | Status: AC
  Administered 2016-04-05: 3 g via INTRAVENOUS
  Filled 2016-04-04: qty 3
  Filled 2016-04-04: qty 3000

## 2016-04-05 ENCOUNTER — Inpatient Hospital Stay (HOSPITAL_COMMUNITY): Payer: 59

## 2016-04-05 ENCOUNTER — Encounter (HOSPITAL_COMMUNITY): Payer: Self-pay

## 2016-04-05 ENCOUNTER — Inpatient Hospital Stay (HOSPITAL_COMMUNITY)
Admission: RE | Admit: 2016-04-05 | Discharge: 2016-04-06 | DRG: 470 | Disposition: A | Discharge status: Home Health | Payer: 59 | Source: Ambulatory Visit | Attending: Orthopedic Surgery | Admitting: Orthopedic Surgery

## 2016-04-05 ENCOUNTER — Inpatient Hospital Stay (HOSPITAL_COMMUNITY): Payer: 59 | Admitting: Anesthesiology

## 2016-04-05 ENCOUNTER — Encounter (HOSPITAL_COMMUNITY)
Admission: RE | Disposition: A | Discharge status: Home Health | Payer: Self-pay | Source: Ambulatory Visit | Attending: Orthopedic Surgery

## 2016-04-05 DIAGNOSIS — K219 Gastro-esophageal reflux disease without esophagitis: Secondary | ICD-10-CM | POA: Diagnosis present

## 2016-04-05 DIAGNOSIS — Z79899 Other long term (current) drug therapy: Secondary | ICD-10-CM | POA: Diagnosis not present

## 2016-04-05 DIAGNOSIS — M169 Osteoarthritis of hip, unspecified: Secondary | ICD-10-CM | POA: Diagnosis present

## 2016-04-05 DIAGNOSIS — M1611 Unilateral primary osteoarthritis, right hip: Secondary | ICD-10-CM

## 2016-04-05 DIAGNOSIS — Z6835 Body mass index (BMI) 35.0-35.9, adult: Secondary | ICD-10-CM | POA: Diagnosis not present

## 2016-04-05 DIAGNOSIS — Z96642 Presence of left artificial hip joint: Secondary | ICD-10-CM | POA: Diagnosis present

## 2016-04-05 DIAGNOSIS — M25551 Pain in right hip: Secondary | ICD-10-CM | POA: Diagnosis present

## 2016-04-05 DIAGNOSIS — M1651 Unilateral post-traumatic osteoarthritis, right hip: Principal | ICD-10-CM | POA: Diagnosis present

## 2016-04-05 DIAGNOSIS — Z7982 Long term (current) use of aspirin: Secondary | ICD-10-CM

## 2016-04-05 DIAGNOSIS — Z96649 Presence of unspecified artificial hip joint: Secondary | ICD-10-CM

## 2016-04-05 DIAGNOSIS — E785 Hyperlipidemia, unspecified: Secondary | ICD-10-CM | POA: Diagnosis present

## 2016-04-05 DIAGNOSIS — E039 Hypothyroidism, unspecified: Secondary | ICD-10-CM | POA: Diagnosis present

## 2016-04-05 DIAGNOSIS — I10 Essential (primary) hypertension: Secondary | ICD-10-CM | POA: Diagnosis present

## 2016-04-05 HISTORY — DX: Disease of intestine, unspecified: K63.9

## 2016-04-05 HISTORY — PX: TOTAL HIP ARTHROPLASTY: SHX124

## 2016-04-05 LAB — TYPE AND SCREEN
ABO/RH(D): O NEG
Antibody Screen: NEGATIVE

## 2016-04-05 LAB — URINALYSIS, ROUTINE W REFLEX MICROSCOPIC
Bilirubin Urine: NEGATIVE
Glucose, UA: NEGATIVE mg/dL
Hgb urine dipstick: NEGATIVE
Ketones, ur: NEGATIVE mg/dL
Leukocytes, UA: NEGATIVE
Nitrite: NEGATIVE
Protein, ur: NEGATIVE mg/dL
Specific Gravity, Urine: 1.016 (ref 1.005–1.030)
pH: 5.5 (ref 5.0–8.0)

## 2016-04-05 LAB — APTT: aPTT: 30 seconds (ref 24–37)

## 2016-04-05 SURGERY — ARTHROPLASTY, HIP, TOTAL, ANTERIOR APPROACH
Anesthesia: Spinal | Site: Hip | Laterality: Right

## 2016-04-05 MED ORDER — SUGAMMADEX SODIUM 200 MG/2ML IV SOLN
INTRAVENOUS | Status: DC | PRN
  Administered 2016-04-05: 300 mg via INTRAVENOUS

## 2016-04-05 MED ORDER — DEXAMETHASONE SODIUM PHOSPHATE 10 MG/ML IJ SOLN
10.0000 mg | Freq: Once | INTRAMUSCULAR | Status: AC
  Administered 2016-04-06: 10 mg via INTRAVENOUS
  Filled 2016-04-05: qty 1

## 2016-04-05 MED ORDER — LOSARTAN POTASSIUM 50 MG PO TABS
50.0000 mg | ORAL_TABLET | Freq: Every day | ORAL | Status: DC
  Administered 2016-04-06: 50 mg via ORAL
  Filled 2016-04-05: qty 1

## 2016-04-05 MED ORDER — PRAVASTATIN SODIUM 20 MG PO TABS
40.0000 mg | ORAL_TABLET | Freq: Every day | ORAL | Status: DC
  Administered 2016-04-05: 40 mg via ORAL
  Filled 2016-04-05 (×2): qty 2

## 2016-04-05 MED ORDER — PHENYLEPHRINE 40 MCG/ML (10ML) SYRINGE FOR IV PUSH (FOR BLOOD PRESSURE SUPPORT)
PREFILLED_SYRINGE | INTRAVENOUS | Status: AC
  Filled 2016-04-05: qty 10

## 2016-04-05 MED ORDER — METOCLOPRAMIDE HCL 5 MG/ML IJ SOLN: 5.0000 mg | Freq: Three times a day (TID) | INTRAMUSCULAR | Status: DC | PRN

## 2016-04-05 MED ORDER — ACETAMINOPHEN 10 MG/ML IV SOLN
INTRAVENOUS | Status: AC
  Filled 2016-04-05: qty 100

## 2016-04-05 MED ORDER — FENTANYL CITRATE (PF) 100 MCG/2ML IJ SOLN
INTRAMUSCULAR | Status: AC
  Filled 2016-04-05: qty 2

## 2016-04-05 MED ORDER — MEPERIDINE HCL 50 MG/ML IJ SOLN: 6.2500 mg | INTRAMUSCULAR | Status: DC | PRN

## 2016-04-05 MED ORDER — BISACODYL 10 MG RE SUPP: 10.0000 mg | Freq: Every day | RECTAL | Status: DC | PRN

## 2016-04-05 MED ORDER — PROPOFOL 10 MG/ML IV BOLUS
INTRAVENOUS | Status: DC | PRN
  Administered 2016-04-05: 20 mg via INTRAVENOUS
  Administered 2016-04-05: 200 mg via INTRAVENOUS

## 2016-04-05 MED ORDER — FLEET ENEMA 7-19 GM/118ML RE ENEM
1.0000 | ENEMA | Freq: Once | RECTAL | Status: DC | PRN
Start: 2016-04-05 — End: 2016-04-06

## 2016-04-05 MED ORDER — CEFAZOLIN SODIUM-DEXTROSE 2-4 GM/100ML-% IV SOLN
2.0000 g | Freq: Four times a day (QID) | INTRAVENOUS | Status: AC
  Administered 2016-04-05 (×2): 2 g via INTRAVENOUS
  Filled 2016-04-05 (×2): qty 100

## 2016-04-05 MED ORDER — LORATADINE 10 MG PO TABS: 10.0000 mg | ORAL_TABLET | Freq: Every day | ORAL | Status: DC | PRN

## 2016-04-05 MED ORDER — DOCUSATE SODIUM 100 MG PO CAPS
100.0000 mg | ORAL_CAPSULE | Freq: Two times a day (BID) | ORAL | Status: DC
  Administered 2016-04-05 – 2016-04-06 (×2): 100 mg via ORAL
  Filled 2016-04-05 (×2): qty 1

## 2016-04-05 MED ORDER — METHOCARBAMOL 500 MG PO TABS
500.0000 mg | ORAL_TABLET | Freq: Four times a day (QID) | ORAL | Status: DC | PRN
  Administered 2016-04-06 (×2): 500 mg via ORAL
  Filled 2016-04-05 (×2): qty 1

## 2016-04-05 MED ORDER — MIDAZOLAM HCL 2 MG/2ML IJ SOLN
INTRAMUSCULAR | Status: AC
  Filled 2016-04-05: qty 2

## 2016-04-05 MED ORDER — ONDANSETRON HCL 4 MG/2ML IJ SOLN
4.0000 mg | Freq: Four times a day (QID) | INTRAMUSCULAR | Status: DC | PRN
  Administered 2016-04-05 – 2016-04-06 (×2): 4 mg via INTRAVENOUS
  Filled 2016-04-05 (×2): qty 2

## 2016-04-05 MED ORDER — ROCURONIUM BROMIDE 100 MG/10ML IV SOLN
INTRAVENOUS | Status: AC
  Filled 2016-04-05: qty 1

## 2016-04-05 MED ORDER — LACTATED RINGERS IV SOLN: INTRAVENOUS | Status: DC

## 2016-04-05 MED ORDER — SODIUM CHLORIDE 0.9 % IJ SOLN
INTRAMUSCULAR | Status: AC
  Filled 2016-04-05: qty 10

## 2016-04-05 MED ORDER — DIPHENHYDRAMINE HCL 12.5 MG/5ML PO ELIX: 12.5000 mg | ORAL_SOLUTION | ORAL | Status: DC | PRN

## 2016-04-05 MED ORDER — OXYCODONE HCL 5 MG PO TABS
5.0000 mg | ORAL_TABLET | ORAL | Status: DC | PRN
  Administered 2016-04-05 – 2016-04-06 (×6): 10 mg via ORAL
  Filled 2016-04-05 (×5): qty 2

## 2016-04-05 MED ORDER — 0.9 % SODIUM CHLORIDE (POUR BTL) OPTIME
TOPICAL | Status: DC | PRN
  Administered 2016-04-05: 1000 mL

## 2016-04-05 MED ORDER — PROPOFOL 10 MG/ML IV BOLUS
INTRAVENOUS | Status: AC
  Filled 2016-04-05: qty 60

## 2016-04-05 MED ORDER — POLYETHYLENE GLYCOL 3350 17 G PO PACK: 17.0000 g | PACK | Freq: Every day | ORAL | Status: DC | PRN

## 2016-04-05 MED ORDER — SUGAMMADEX SODIUM 200 MG/2ML IV SOLN
INTRAVENOUS | Status: AC
  Filled 2016-04-05: qty 2

## 2016-04-05 MED ORDER — CHLORHEXIDINE GLUCONATE 4 % EX LIQD: 60.0000 mL | Freq: Once | CUTANEOUS | Status: DC

## 2016-04-05 MED ORDER — PHENYLEPHRINE 40 MCG/ML (10ML) SYRINGE FOR IV PUSH (FOR BLOOD PRESSURE SUPPORT)
PREFILLED_SYRINGE | INTRAVENOUS | Status: DC | PRN
  Administered 2016-04-05 (×5): 80 ug via INTRAVENOUS

## 2016-04-05 MED ORDER — TRAMADOL HCL 50 MG PO TABS: 50.0000 mg | ORAL_TABLET | Freq: Four times a day (QID) | ORAL | Status: DC | PRN

## 2016-04-05 MED ORDER — ONDANSETRON HCL 4 MG/2ML IJ SOLN
INTRAMUSCULAR | Status: AC
  Filled 2016-04-05: qty 2

## 2016-04-05 MED ORDER — HYDROMORPHONE HCL 1 MG/ML IJ SOLN
INTRAMUSCULAR | Status: AC
  Filled 2016-04-05: qty 1

## 2016-04-05 MED ORDER — RIVAROXABAN 10 MG PO TABS
10.0000 mg | ORAL_TABLET | Freq: Every day | ORAL | Status: DC
  Administered 2016-04-06: 10 mg via ORAL
  Filled 2016-04-05: qty 1

## 2016-04-05 MED ORDER — LOSARTAN POTASSIUM-HCTZ 50-12.5 MG PO TABS: 1.0000 | ORAL_TABLET | Freq: Every day | ORAL | Status: DC

## 2016-04-05 MED ORDER — ACETAMINOPHEN 10 MG/ML IV SOLN
1000.0000 mg | Freq: Once | INTRAVENOUS | Status: AC
  Administered 2016-04-05: 1000 mg via INTRAVENOUS

## 2016-04-05 MED ORDER — PROMETHAZINE HCL 25 MG/ML IJ SOLN: 6.2500 mg | INTRAMUSCULAR | Status: DC | PRN

## 2016-04-05 MED ORDER — TRANEXAMIC ACID 1000 MG/10ML IV SOLN
1000.0000 mg | INTRAVENOUS | Status: AC
  Administered 2016-04-05: 1000 mg via INTRAVENOUS
  Filled 2016-04-05: qty 10

## 2016-04-05 MED ORDER — SUCCINYLCHOLINE CHLORIDE 20 MG/ML IJ SOLN
INTRAMUSCULAR | Status: DC | PRN
  Administered 2016-04-05: 100 mg via INTRAVENOUS

## 2016-04-05 MED ORDER — LIDOCAINE HCL (CARDIAC) 20 MG/ML IV SOLN
INTRAVENOUS | Status: AC
  Filled 2016-04-05: qty 5

## 2016-04-05 MED ORDER — SODIUM CHLORIDE 0.9 % IV SOLN
INTRAVENOUS | Status: DC
  Administered 2016-04-06: 02:00:00 via INTRAVENOUS

## 2016-04-05 MED ORDER — BUPIVACAINE HCL (PF) 0.25 % IJ SOLN
INTRAMUSCULAR | Status: AC
  Filled 2016-04-05: qty 30

## 2016-04-05 MED ORDER — LACTATED RINGERS IV SOLN
INTRAVENOUS | Status: DC
  Administered 2016-04-05 (×3): via INTRAVENOUS

## 2016-04-05 MED ORDER — HYDROMORPHONE HCL 2 MG/ML IJ SOLN
INTRAMUSCULAR | Status: AC
  Filled 2016-04-05: qty 1

## 2016-04-05 MED ORDER — ACETAMINOPHEN 325 MG PO TABS: 650.0000 mg | ORAL_TABLET | Freq: Four times a day (QID) | ORAL | Status: DC | PRN

## 2016-04-05 MED ORDER — HYDROMORPHONE HCL 1 MG/ML IJ SOLN
0.2500 mg | INTRAMUSCULAR | Status: DC | PRN
  Administered 2016-04-05 (×3): 0.5 mg via INTRAVENOUS

## 2016-04-05 MED ORDER — PHENOL 1.4 % MT LIQD: 1.0000 | OROMUCOSAL | Status: DC | PRN

## 2016-04-05 MED ORDER — METHOCARBAMOL 1000 MG/10ML IJ SOLN
500.0000 mg | Freq: Four times a day (QID) | INTRAMUSCULAR | Status: DC | PRN
  Filled 2016-04-05: qty 5

## 2016-04-05 MED ORDER — DEXAMETHASONE SODIUM PHOSPHATE 10 MG/ML IJ SOLN: 10.0000 mg | Freq: Once | INTRAMUSCULAR | Status: DC

## 2016-04-05 MED ORDER — MORPHINE SULFATE (PF) 2 MG/ML IV SOLN: 1.0000 mg | INTRAVENOUS | Status: DC | PRN

## 2016-04-05 MED ORDER — ONDANSETRON HCL 4 MG PO TABS: 4.0000 mg | ORAL_TABLET | Freq: Four times a day (QID) | ORAL | Status: DC | PRN

## 2016-04-05 MED ORDER — HYDROMORPHONE HCL 1 MG/ML IJ SOLN
INTRAMUSCULAR | Status: DC | PRN
  Administered 2016-04-05: 0.5 mg via INTRAVENOUS
  Administered 2016-04-05: 1 mg via INTRAVENOUS
  Administered 2016-04-05: 0.5 mg via INTRAVENOUS

## 2016-04-05 MED ORDER — ONDANSETRON HCL 4 MG/2ML IJ SOLN
INTRAMUSCULAR | Status: DC | PRN
  Administered 2016-04-05: 4 mg via INTRAVENOUS

## 2016-04-05 MED ORDER — DEXAMETHASONE SODIUM PHOSPHATE 10 MG/ML IJ SOLN
INTRAMUSCULAR | Status: AC
  Filled 2016-04-05: qty 1

## 2016-04-05 MED ORDER — PANTOPRAZOLE SODIUM 40 MG PO TBEC
80.0000 mg | DELAYED_RELEASE_TABLET | Freq: Every day | ORAL | Status: DC
  Administered 2016-04-06: 80 mg via ORAL
  Filled 2016-04-05: qty 2

## 2016-04-05 MED ORDER — GLYCOPYRROLATE 0.2 MG/ML IJ SOLN
INTRAMUSCULAR | Status: AC
  Filled 2016-04-05: qty 3

## 2016-04-05 MED ORDER — ROCURONIUM BROMIDE 100 MG/10ML IV SOLN
INTRAVENOUS | Status: DC | PRN
  Administered 2016-04-05: 10 mg via INTRAVENOUS
  Administered 2016-04-05: 35 mg via INTRAVENOUS

## 2016-04-05 MED ORDER — LIDOCAINE HCL (CARDIAC) 20 MG/ML IV SOLN
INTRAVENOUS | Status: DC | PRN
  Administered 2016-04-05: 20 mg via INTRAVENOUS

## 2016-04-05 MED ORDER — BUPIVACAINE HCL (PF) 0.25 % IJ SOLN
INTRAMUSCULAR | Status: DC | PRN
  Administered 2016-04-05: 30 mL

## 2016-04-05 MED ORDER — MENTHOL 3 MG MT LOZG
1.0000 | LOZENGE | OROMUCOSAL | Status: DC | PRN
Start: 2016-04-05 — End: 2016-04-06

## 2016-04-05 MED ORDER — FENTANYL CITRATE (PF) 100 MCG/2ML IJ SOLN
INTRAMUSCULAR | Status: DC | PRN
  Administered 2016-04-05 (×2): 50 ug via INTRAVENOUS
  Administered 2016-04-05: 75 ug via INTRAVENOUS
  Administered 2016-04-05: 25 ug via INTRAVENOUS

## 2016-04-05 MED ORDER — ACETAMINOPHEN 650 MG RE SUPP: 650.0000 mg | Freq: Four times a day (QID) | RECTAL | Status: DC | PRN

## 2016-04-05 MED ORDER — STERILE WATER FOR IRRIGATION IR SOLN
Status: DC | PRN
  Administered 2016-04-05: 3000 mL

## 2016-04-05 MED ORDER — ACETAMINOPHEN 500 MG PO TABS
1000.0000 mg | ORAL_TABLET | Freq: Four times a day (QID) | ORAL | Status: AC
  Administered 2016-04-05 – 2016-04-06 (×4): 1000 mg via ORAL
  Filled 2016-04-05 (×4): qty 2

## 2016-04-05 MED ORDER — DEXAMETHASONE SODIUM PHOSPHATE 10 MG/ML IJ SOLN
10.0000 mg | Freq: Once | INTRAMUSCULAR | Status: AC
  Administered 2016-04-05: 10 mg via INTRAVENOUS

## 2016-04-05 MED ORDER — MIDAZOLAM HCL 5 MG/5ML IJ SOLN
INTRAMUSCULAR | Status: DC | PRN
  Administered 2016-04-05: 2 mg via INTRAVENOUS

## 2016-04-05 MED ORDER — MIDAZOLAM HCL 2 MG/2ML IJ SOLN: 0.5000 mg | Freq: Once | INTRAMUSCULAR | Status: DC | PRN

## 2016-04-05 MED ORDER — LEVOTHYROXINE SODIUM 50 MCG PO TABS
50.0000 ug | ORAL_TABLET | Freq: Every day | ORAL | Status: DC
  Administered 2016-04-06: 50 ug via ORAL
  Filled 2016-04-05: qty 1

## 2016-04-05 MED ORDER — METOCLOPRAMIDE HCL 5 MG PO TABS
5.0000 mg | ORAL_TABLET | Freq: Three times a day (TID) | ORAL | Status: DC | PRN
  Administered 2016-04-05: 10 mg via ORAL
  Filled 2016-04-05: qty 2

## 2016-04-05 MED ORDER — EPHEDRINE SULFATE 50 MG/ML IJ SOLN
INTRAMUSCULAR | Status: DC | PRN
  Administered 2016-04-05: 10 mg via INTRAVENOUS
  Administered 2016-04-05 (×2): 5 mg via INTRAVENOUS

## 2016-04-05 MED ORDER — TRANEXAMIC ACID 1000 MG/10ML IV SOLN
1000.0000 mg | Freq: Once | INTRAVENOUS | Status: AC
  Administered 2016-04-05: 1000 mg via INTRAVENOUS
  Filled 2016-04-05: qty 10

## 2016-04-05 MED ORDER — HYDROCHLOROTHIAZIDE 12.5 MG PO CAPS
12.5000 mg | ORAL_CAPSULE | Freq: Every day | ORAL | Status: DC
  Administered 2016-04-06: 12.5 mg via ORAL
  Filled 2016-04-05: qty 1

## 2016-04-05 SURGICAL SUPPLY — 35 items
BAG DECANTER FOR FLEXI CONT (MISCELLANEOUS) IMPLANT
BAG ZIPLOCK 12X15 (MISCELLANEOUS) IMPLANT
BLADE SAG 18X100X1.27 (BLADE) ×2 IMPLANT
CAPT HIP TOTAL 2 ×2 IMPLANT
CLOTH BEACON ORANGE TIMEOUT ST (SAFETY) ×2 IMPLANT
COVER PERINEAL POST (MISCELLANEOUS) ×2 IMPLANT
DECANTER SPIKE VIAL GLASS SM (MISCELLANEOUS) ×2 IMPLANT
DRAPE STERI IOBAN 125X83 (DRAPES) ×2 IMPLANT
DRAPE U-SHAPE 47X51 STRL (DRAPES) ×4 IMPLANT
DRSG ADAPTIC 3X8 NADH LF (GAUZE/BANDAGES/DRESSINGS) ×2 IMPLANT
DRSG MEPILEX BORDER 4X4 (GAUZE/BANDAGES/DRESSINGS) ×2 IMPLANT
DRSG MEPILEX BORDER 4X8 (GAUZE/BANDAGES/DRESSINGS) ×2 IMPLANT
DURAPREP 26ML APPLICATOR (WOUND CARE) ×2 IMPLANT
ELECT REM PT RETURN 9FT ADLT (ELECTROSURGICAL) ×2
ELECTRODE REM PT RTRN 9FT ADLT (ELECTROSURGICAL) ×1 IMPLANT
EVACUATOR 1/8 PVC DRAIN (DRAIN) ×2 IMPLANT
GLOVE BIO SURGEON STRL SZ7.5 (GLOVE) IMPLANT
GLOVE BIO SURGEON STRL SZ8 (GLOVE) ×4 IMPLANT
GLOVE BIOGEL PI IND STRL 6.5 (GLOVE) ×4 IMPLANT
GLOVE BIOGEL PI IND STRL 8 (GLOVE) ×4 IMPLANT
GLOVE BIOGEL PI INDICATOR 6.5 (GLOVE) ×4
GLOVE BIOGEL PI INDICATOR 8 (GLOVE) ×4
GOWN STRL REUS W/TWL LRG LVL3 (GOWN DISPOSABLE) ×2 IMPLANT
GOWN STRL REUS W/TWL XL LVL3 (GOWN DISPOSABLE) ×2 IMPLANT
PACK ANTERIOR HIP CUSTOM (KITS) ×2 IMPLANT
STRIP CLOSURE SKIN 1/2X4 (GAUZE/BANDAGES/DRESSINGS) ×2 IMPLANT
SUT ETHIBOND NAB CT1 #1 30IN (SUTURE) ×2 IMPLANT
SUT MNCRL AB 4-0 PS2 18 (SUTURE) ×2 IMPLANT
SUT VIC AB 2-0 CT1 27 (SUTURE) ×2
SUT VIC AB 2-0 CT1 TAPERPNT 27 (SUTURE) ×2 IMPLANT
SUT VLOC 180 0 24IN GS25 (SUTURE) ×2 IMPLANT
SYR 50ML LL SCALE MARK (SYRINGE) IMPLANT
TRAY FOLEY W/METER SILVER 14FR (SET/KITS/TRAYS/PACK) IMPLANT
TRAY FOLEY W/METER SILVER 16FR (SET/KITS/TRAYS/PACK) ×2 IMPLANT
YANKAUER SUCT BULB TIP 10FT TU (MISCELLANEOUS) ×2 IMPLANT

## 2016-04-05 NOTE — Evaluation (Signed)
Physical Therapy Evaluation Patient Details Name: Colin [name] MRN: [id number] DOB: 06/22/1951 Today's Date: 04/05/2016   History of Present Illness  R DATHA, old multiple L hip surgeries  Clinical Impression  The patient became nauseated and dizzy. BP WNL. The RN notified . Patient left in  [place]. Pt admitted with above diagnosis. Pt currently with functional limitations due to the deficits listed below (see PT Problem List).  Pt will benefit from skilled PT to increase their independence and safety with mobility to allow discharge to the venue listed below.       Follow Up Recommendations Home health PT    Equipment Recommendations  None recommended by PT    Recommendations for Other Services       Precautions / Restrictions Precautions Precautions: Fall      Mobility  Bed Mobility Overal bed mobility: Needs Assistance Bed Mobility: Supine to Sit     Supine to sit: Min guard     General bed mobility comments: cues for technique  Transfers Overall transfer level: Needs assistance Equipment used: Rolling walker (2 wheeled) Transfers: Sit to/from Stand Sit to Stand: Min assist         General transfer comment: cues for hand and R leg position  Ambulation/Gait Ambulation/Gait assistance: Min assist Ambulation Distance (Feet): 30 Feet Assistive device: Rolling walker (2 wheeled) Gait Pattern/deviations: Step-to pattern     General Gait Details: became nuaseated and dizzy, chair brought up  Stairs            Wheelchair Mobility    Modified Rankin (Stroke Patients Only)       Balance                                             Pertinent Vitals/Pain Pain Assessment: 0-10 Pain Score: 2  Pain Location:  R hip Pain Descriptors / Indicators: Discomfort;Tightness Pain Intervention(s): Limited activity within patient's tolerance;Monitored during session;Premedicated before session;Ice applied    Home Living Family/patient  expects to be discharged to:: Private residence   Available Help at Discharge: Family Type of Home: House         Home Equipment: Environmental consultant - 2 wheels (maybe)      Prior Function Level of Independence: Independent               Hand Dominance        Extremity/Trunk Assessment   Upper Extremity Assessment: Defer to OT evaluation           Lower Extremity Assessment: RLE deficits/detail RLE Deficits / Details: flexes hip in supine    Cervical / Trunk Assessment: Normal  Communication   Communication: No difficulties  Cognition Arousal/Alertness: Awake/alert Behavior During Therapy: WFL for tasks assessed/performed Overall Cognitive Status: Within Functional Limits for tasks assessed                      General Comments      Exercises        Assessment/Plan    PT Assessment Patient needs continued PT services  PT Diagnosis Difficulty walking   PT Problem List Decreased strength;Decreased range of motion;Decreased activity tolerance;Decreased mobility;Decreased knowledge of use of DME;Decreased safety awareness;Decreased knowledge of precautions;Pain  PT Treatment Interventions DME instruction;Gait training;Stair training;Functional mobility training;Therapeutic activities;Therapeutic exercise;Patient/family education   PT Goals (Current goals can be found in the Care Plan section) Acute  Rehab PT Goals Patient Stated Goal: to walk even PT Goal Formulation: With patient/family Time For Goal Achievement: 04/08/16 Potential to Achieve Goals: Good    Frequency 7X/week   Barriers to discharge        Co-evaluation               End of Session Equipment Utilized During Treatment: Gait belt Activity Tolerance: Treatment limited secondary to medical complications (Comment) Patient left: in chair;with call bell/phone within reach;with family/visitor present Nurse Communication: Mobility status         Time: 1633-1700 PT Time  Calculation (min) (ACUTE ONLY): 27 min   Charges:   PT Evaluation $PT Eval Low Complexity: 1 Procedure PT Treatments $Gait Training: 8-22 mins   PT G Codes:        Claretha Cooper 04/05/2016, 5:06 PM Tresa Endo PT [phone]

## 2016-04-05 NOTE — Interval H&P Note (Signed)
History and Physical Interval Note:  04/05/2016 8:10 AM  Colin [name]  has presented today for surgery, with the diagnosis of RIGHT HIP OA  The various methods of treatment have been discussed with the patient and family. After consideration of risks, benefits and other options for treatment, the patient has consented to  Procedure(s): RIGHT TOTAL HIP ARTHROPLASTY ANTERIOR APPROACH (Right) as a surgical intervention .  The patient's history has been reviewed, patient examined, no change in status, stable for surgery.  I have reviewed the patient's chart and labs.  Questions were answered to the patient's satisfaction.     Gearlean Alf

## 2016-04-05 NOTE — Anesthesia Procedure Notes (Addendum)
Spinal Patient location during procedure: OR Staffing Anesthesiologist: Glennon Mac, CARSWELL Performed by: anesthesiologist  Preanesthetic Checklist Completed: patient identified, surgical consent, pre-op evaluation, timeout performed, IV checked, risks and benefits discussed and monitors and equipment checked Spinal Block Patient position: sitting Prep: Betadine, ChloraPrep and site prepped and draped Patient monitoring: blood pressure, continuous pulse ox and heart rate Approach: midline Location: L3-4 Injection technique: single-shot Needle Needle type: Quincke  Needle gauge: 25 G Needle length: 9 cm Assessment Events: unable to enter CSF, aborted Additional Notes Pt identified in Operating room.  Monitors applied. Working IV access confirmed. Sterile prep, drape lumbar spine.  1% lido local L 3,4.  #25ga Quincke, unable to enter CSF despite multiple passes. Aborted SAB, proceed to [place].  Jenita Seashore, MD   Procedure Name: Intubation Date/Time: 04/05/2016 8:57 AM Performed by: Candie Gintz, Virgel Gess Pre-anesthesia Checklist: Patient identified, Emergency Drugs available, Suction available, Patient being monitored and Timeout performed Patient Re-evaluated:Patient Re-evaluated prior to inductionOxygen Delivery Method: Circle system utilized Preoxygenation: Pre-oxygenation with 100% oxygen Intubation Type: IV induction Ventilation: Mask ventilation without difficulty Laryngoscope Size: Mac and 4 Grade View: Grade II Tube type: Oral Tube size: 7.5 mm Number of attempts: 1 Airway Equipment and Method: Stylet Placement Confirmation: ETT inserted through vocal cords under direct vision,  positive ETCO2,  CO2 detector and breath sounds checked- equal and bilateral Secured at: 22 cm Tube secured with: Tape Dental Injury: Teeth and Oropharynx as per pre-operative assessment

## 2016-04-05 NOTE — Op Note (Signed)
OPERATIVE REPORT  PREOPERATIVE DIAGNOSIS: Osteoarthritis of the Right hip.   POSTOPERATIVE DIAGNOSIS: Osteoarthritis of the Right  hip.   PROCEDURE: Right total hip arthroplasty, anterior approach.   SURGEON: Gaynelle Arabian, MD   ASSISTANT: Ardeen Jourdain, PA-C  ANESTHESIA:  General  ESTIMATED BLOOD LOSS:-450 ml    DRAINS: Hemovac x1.   COMPLICATIONS: None   CONDITION: PACU - hemodynamically stable.   BRIEF CLINICAL NOTE: Colin [name] is a 65 y.o. male who has advanced end-  stage arthritis of his Right  hip with progressively worsening pain and  dysfunction.The patient has failed nonoperative management and presents for  total hip arthroplasty.   PROCEDURE IN DETAIL: After successful administration of spinal  anesthetic, the traction boots for the [facility] bed were placed on both  feet and the patient was placed onto the [facility] bed, boots placed into the leg  holders. The Right hip was then isolated from the perineum with plastic  drapes and prepped and draped in the usual sterile fashion. ASIS and  greater trochanter were marked and a oblique incision was made, starting  at about 1 cm lateral and 2 cm distal to the ASIS and coursing towards  the anterior cortex of the femur. The skin was cut with a 10 blade  through subcutaneous tissue to the level of the fascia overlying the  tensor fascia lata muscle. The fascia was then incised in line with the  incision at the junction of the anterior third and posterior 2/3rd. The  muscle was teased off the fascia and then the interval between the TFL  and the rectus was developed. The Hohmann retractor was then placed at  the top of the femoral neck over the capsule. The vessels overlying the  capsule were cauterized and the fat on top of the capsule was removed.  A Hohmann retractor was then placed anterior underneath the rectus  femoris to give exposure to the entire anterior capsule. A T-shaped  capsulotomy was  performed. The edges were tagged and the femoral head  was identified.       Osteophytes are removed off the superior acetabulum.  The femoral neck was then cut in situ with an oscillating saw. Traction  was then applied to the left lower extremity utilizing the [facility]  traction. The femoral head was then removed. Retractors were placed  around the acetabulum and then circumferential removal of the labrum was  performed. Osteophytes were also removed. Reaming starts at 47 mm to  medialize and  Increased in 2 mm increments to 53 mm. We reamed in  approximately 40 degrees of abduction, 20 degrees anteversion. A 54 mm  pinnacle acetabular shell was then impacted in anatomic position under  fluoroscopic guidance with excellent purchase. We did not need to place  any additional dome screws. A 36 mm neutral + 4 marathon liner was then  placed into the acetabular shell.       The femoral lift was then placed along the lateral aspect of the femur  just distal to the vastus ridge. The leg was  externally rotated and capsule  was stripped off the inferior aspect of the femoral neck down to the  level of the lesser trochanter, this was done with electrocautery. The femur was lifted after this was performed. The  leg was then placed and extended in adducted position to essentially delivering the femur. We also removed the capsule superiorly and the  piriformis from the  piriformis fossa to gain excellent exposure of the  proximal femur. Rongeur was used to remove some cancellous bone to get  into the lateral portion of the proximal femur for placement of the  initial starter reamer. The starter broaches was placed  the starter broach  and was shown to go down the center of the canal. Broaching  with the  Corail system was then performed starting at size 8, coursing  Up to size 11. A size 11 had excellent torsional and rotational  and axial stability. The trial high offset neck was then placed  with a 36 +  5 trial head. The hip was then reduced. We confirmed that  the stem was in the canal both on AP and lateral x-rays. It also has excellent sizing. The hip was reduced with outstanding stability through full extension, full external rotation,  and then flexion in adduction internal rotation. AP pelvis was taken  and the leg lengths were measured and found to be exactly equal. Hip  was then dislocated again and the femoral head and neck removed. The  femoral broach was removed. Size 11 Corail stem with a high offset  neck was then impacted into the femur following native anteversion. Has  excellent purchase in the canal. Excellent torsional and rotational and  axial stability. It is confirmed to be in the canal on AP and lateral  fluoroscopic views. The 36 + 5 ceramic head was placed and the hip  reduced with outstanding stability. Again AP pelvis was taken and it  confirmed that the leg lengths were equal. The wound was then copiously  irrigated with saline solution and the capsule reattached and repaired  with Ethibond suture. 30 ml of .25% Bupivicaine injected into the capsule and into the edge of the tensor fascia lata as well as subcutaneous tissue. The fascia overlying the tensor fascia lata was  then closed with a running #1 V-Loc. Subcu was closed with interrupted  2-0 Vicryl and subcuticular running 4-0 Monocryl. Incision was cleaned  and dried. Steri-Strips and a bulky sterile dressing applied. Hemovac  drain was hooked to suction and then he was awakened and transported to  recovery in stable condition.        Please note that a surgical assistant was a medical necessity for this procedure to perform it in a safe and expeditious manner. Assistant was necessary to provide appropriate retraction of vital neurovascular structures and to prevent femoral fracture and allow for anatomic placement of the prosthesis.  Gaynelle Arabian, M.D.

## 2016-04-05 NOTE — Anesthesia Preprocedure Evaluation (Addendum)
Anesthesia Evaluation  Patient identified by MRN, date of birth, ID band Patient awake    Reviewed: Allergy & Precautions, NPO status , Patient's Chart, lab work & pertinent test results  History of Anesthesia Complications Negative for: history of anesthetic complications  Airway Mallampati: I  TM Distance: >3 FB Neck ROM: Full    Dental  (+) Dental Advisory Given   Pulmonary neg pulmonary ROS,    breath sounds clear to auscultation       Cardiovascular hypertension, Pt. on medications (-) angina Rhythm:Regular Rate:Normal  '13 Myoview: normal with EF >55%   Neuro/Psych Depression negative neurological ROS     GI/Hepatic Neg liver ROS, GERD  Medicated and Controlled,  Endo/Other  Hypothyroidism Morbid obesity  Renal/GU negative Renal ROS     Musculoskeletal  (+) Arthritis , Osteoarthritis,    Abdominal (+) + obese,   Peds  Hematology negative hematology ROS (+)   Anesthesia Other Findings   Reproductive/Obstetrics                           Anesthesia Physical Anesthesia Plan  ASA: II  Anesthesia Plan: Spinal   Post-op Pain Management:    Induction:   Airway Management Planned: Natural Airway and Simple Face Mask  Additional Equipment:   Intra-op Plan:   Post-operative Plan:   Informed Consent: I have reviewed the patients History and Physical, chart, labs and discussed the procedure including the risks, benefits and alternatives for the proposed anesthesia with the patient or authorized representative who has indicated his/her understanding and acceptance.   Dental advisory given  Plan Discussed with: CRNA and Surgeon  Anesthesia Plan Comments: (Plan routine monitors, SAB)        Anesthesia Quick Evaluation

## 2016-04-05 NOTE — H&P (View-Only) (Signed)
Colin [name] DOB: 05/07/1951 Married / Language: Cleophus Molt / Race: White Male Date of Admission:  04/05/2016 CC:  Right Hip Pain History of Present Illness  The patient is a 65 year old male who comes in for a preoperative History and Physical. The patient is scheduled for a right total hip arthroplasty (anterior) to be performed by Dr. Dione Plover. Aluisio, MD at [facility] on 04/05/2016. The patient is a 65 year old male who presented for follow up of their hip. The patient is being followed for their bilateral (left THA revision; right hip osteoarthritis and total hip revision. Symptoms reported include: pain and aching. The patient feels that they are doing poorly and report their pain level to be moderate to severe. The following medication has been used for pain control: antiinflammatory medication (Ibuprophen, occasionally). He stated we have been following him along for his left hip for a few years now. He has had a revision hip replacement done and has an osteolytic area just below the lesser trochanter. He is not really having much pain or any difference in pain on the left side compared to even a year ago. Unfortunately, his right hip is getting progressively worse. Right hip is hurting with most all activities, even hurting at rest. It is starting to lose more mobility. It is starting to limit him more. He is now reached a point where he is ready to get it fixed. They have been treated conservatively in the past for the above stated problem and despite conservative measures, they continue to have progressive pain and severe functional limitations and dysfunction. They have failed non-operative management including home exercise, medications. It is felt that they would benefit from undergoing total joint replacement. Risks and benefits of the procedure have been discussed with the patient and they elect to proceed with surgery. There are no active contraindications to surgery such as ongoing  infection or rapidly progressive neurological disease.   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 Osteoarthritis  Father, Mother. Drug / Alcohol Addiction  Father. Cancer  Father, Mother, Sister. Cerebrovascular Accident  Mother.  Social History Not under pain contract  No history of drug/alcohol rehab  Number of flights of stairs before winded  2-3 Tobacco use  Never smoker. 08/02/2013 Tobacco / smoke exposure  08/02/2013: no Marital status  married Current drinker  08/02/2013: Currently drinks wine only occasionally per week Children  3 Current work status  working full time Living situation  live with spouse Exercise  Exercises weekly; does other [place] with wife following the Right THA-AA to be performed on 04/05/2016. Advance Directives  Healthcare POA  Medication History  Aspirin (81MG  Tablet, 1 (one) Oral) Active. Fish Oil Concentrate (1000MG  Capsule, 1 (one) Oral) Active. Aleve (220MG  Tablet, Oral as needed) Active. Losartan Potassium-HCTZ (50-12.5MG  Tablet, Oral) Active. Lovastatin (40MG  Tablet, Oral) Active. Omeprazole (40MG  Capsule DR, Oral) Active. Viagra (100MG  Tablet, Oral) Active.  Past Surgical History  Carpal Tunnel Repair  Date: 2013. bilateral Neck Disc Surgery  Total Hip Replacement  Date: 78. left Revison Left Total Hip  Date: 81. Revision Left Total Hip  Date: 2013.  Appendectomy  Date: 2012. Hernia Repair  Date: 2012. Vasectomy    Review of Systems General Not  Present- Chills, Fatigue, Fever, Memory Loss, Night Sweats, Weight Gain and Weight Loss. Skin Not Present- Eczema, Hives, Itching, Lesions and Rash. HEENT Not Present- Dentures, Double Vision, Headache, Hearing Loss, Tinnitus and Visual Loss. Respiratory Present- Shortness of breath with exertion. Not Present- Allergies, Chronic Cough, Coughing up blood and Shortness of breath at rest. Cardiovascular Not Present- Chest Pain, Difficulty Breathing Lying Down, Murmur, Palpitations, Racing/skipping heartbeats and Swelling. Gastrointestinal Not Present- Abdominal Pain, Bloody Stool, Constipation, Diarrhea, Difficulty Swallowing, Heartburn, Jaundice, Loss of appetitie, Nausea and Vomiting. Male Genitourinary Not Present- Blood in Urine, Discharge, Flank Pain, Incontinence, Painful Urination, Urgency, Urinary frequency, Urinary Retention, Urinating at Night and Weak urinary stream. Musculoskeletal Present- Back Pain and Joint Pain. Not Present- Joint Swelling, Morning Stiffness, Muscle Pain, Muscle Weakness and Spasms. Neurological Not Present- Blackout spells, Difficulty with balance, Dizziness, Paralysis, Tremor and Weakness. Psychiatric Not Present- Insomnia.  Vitals  Weight: 260 lb Height: 73in Weight was reported by patient. Height was reported by patient. Body Surface Area: 2.41 m Body Mass Index: 34.3 kg/m  Pulse: 56 (Regular)  BP: 122/76 (Sitting, Right Arm, Standard)  Physical Exam General Mental Status -Alert, cooperative and good historian. General Appearance-pleasant, Not in acute distress. Orientation-Oriented X3. Build & Nutrition-Well nourished and Well developed.  Head and Neck Head-normocephalic, atraumatic . Neck Global Assessment - supple, no bruit auscultated on the right, no bruit auscultated on the left.  Eye Vision-Wears corrective lenses. Pupil - Bilateral-Regular and Round. Motion - Bilateral-EOMI.  Chest and Lung  Exam Auscultation Breath sounds - clear at anterior chest wall and clear at posterior chest wall. Adventitious sounds - No Adventitious sounds.  Cardiovascular Auscultation Rhythm - Regular rate and rhythm. Heart Sounds - S1 WNL and S2 WNL. Murmurs & Other Heart Sounds - Auscultation of the heart reveals - No Murmurs.  Abdomen Palpation/Percussion Tenderness - Abdomen is non-tender to palpation. Rigidity (guarding) - Abdomen is soft. Auscultation Auscultation of the abdomen reveals - Bowel sounds normal.  Male Genitourinary Note: Not done, not pertinent to present illness   Musculoskeletal Note: On exam, he is alert and oriented, in no apparent distress. His left hip can be flexed to about minimal internal rotation about 20, external rotation 20/30 of abduction. Right hip flexion 90, no internal rotation, only about 10 degrees of external rotation, 10 to 20 degrees of abduction. He walks with a significantly antalgic gait pattern really on both legs. He is slightly tender over his right greater trochanter. There is no left trochanteric tenderness. Pulse sensation and motor intact.  RADIOGRAPHS His radiographs, AP pelvis, AP and lateral of both hips showed severe bone-on-bone arthritis on the right hip with large osteophyte formation, no joint space left. Left hip shows the revision prosthesis in good position. There is no change in that osteolytic area just below the lesser trochanter compared to films of one year and two years ago. No evidence of any loosening of his component.   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 Antony Contras - Patient has been seen preoperatively and felt to be stable for surgery.  IV TXA  Anesthesia Issues: None  Signed electronically by Ok Edwards, III PA-C

## 2016-04-05 NOTE — Transfer of Care (Signed)
Immediate Anesthesia Transfer of Care Note  Patient: Colin [name]  Procedure(s) Performed: Procedure(s): RIGHT TOTAL HIP ARTHROPLASTY ANTERIOR APPROACH (Right)  Patient Location: PACU  Anesthesia Type:General  Level of Consciousness:  sedated, patient cooperative and responds to stimulation  Airway & Oxygen Therapy:Patient Spontanous Breathing and Patient connected to face mask oxgen  Post-op Assessment:  Report given to PACU RN and Post -op Vital signs reviewed and stable  Post vital signs:  Reviewed and stable  Last Vitals:  Filed Vitals:   04/05/16 0539  BP: 123/78  Pulse: 56  Temp: 37.2 C  Resp: 18    Complications: No apparent anesthesia complications

## 2016-04-05 NOTE — Anesthesia Postprocedure Evaluation (Signed)
Anesthesia Post Note  Patient: Colin [name]  Procedure(s) Performed: Procedure(s) (LRB): RIGHT TOTAL HIP ARTHROPLASTY ANTERIOR APPROACH (Right)  Patient location during evaluation: PACU Anesthesia Type: General Level of consciousness: awake and alert, oriented and patient cooperative Pain management: pain level controlled Vital Signs Assessment: post-procedure vital signs reviewed and stable Respiratory status: spontaneous breathing, nonlabored ventilation, respiratory function stable and patient connected to nasal cannula oxygen Cardiovascular status: blood pressure returned to baseline and stable Postop Assessment: no signs of nausea or vomiting Anesthetic complications: no    Last Vitals:  Filed Vitals:   04/05/16 1047 04/05/16 1100  BP: 140/89 135/74  Pulse: 79 76  Temp:  36.8 C  Resp: 12 18    Last Pain:  Filed Vitals:   04/05/16 1108  PainSc: 4                  Brytnee Bechler,E. Ashima Shrake

## 2016-04-06 ENCOUNTER — Encounter (HOSPITAL_COMMUNITY): Payer: Self-pay | Admitting: Orthopedic Surgery

## 2016-04-06 LAB — BASIC METABOLIC PANEL
Anion gap: 6 (ref 5–15)
BUN: 15 mg/dL (ref 6–20)
CO2: 27 mmol/L (ref 22–32)
Calcium: 8.9 mg/dL (ref 8.9–10.3)
Chloride: 107 mmol/L (ref 101–111)
Creatinine, Ser: 0.97 mg/dL (ref 0.61–1.24)
GFR calc Af Amer: >60 mL/min (ref >60)
GFR calc non Af Amer: >60 mL/min (ref >60)
Glucose, Bld: 126 mg/dL — ABNORMAL HIGH (ref 65–99)
Potassium: 4.5 mmol/L (ref 3.5–5.1)
Sodium: 140 mmol/L (ref 135–145)

## 2016-04-06 LAB — CBC
HCT: 34 % — ABNORMAL LOW (ref 39.0–52.0)
Hemoglobin: 11.5 g/dL — ABNORMAL LOW (ref 13.0–17.0)
MCH: 30.4 pg (ref 26.0–34.0)
MCHC: 33.8 g/dL (ref 30.0–36.0)
MCV: 89.9 fL (ref 78.0–100.0)
Platelets: 177 10*3/uL (ref 150–400)
RBC: 3.78 MIL/uL — ABNORMAL LOW (ref 4.22–5.81)
RDW: 13.8 % (ref 11.5–15.5)
WBC: 10.8 10*3/uL — ABNORMAL HIGH (ref 4.0–10.5)

## 2016-04-06 MED ORDER — RIVAROXABAN 10 MG PO TABS: 10.0000 mg | ORAL_TABLET | Freq: Every day | ORAL | Status: DC

## 2016-04-06 MED ORDER — METHOCARBAMOL 500 MG PO TABS: 500.0000 mg | ORAL_TABLET | Freq: Four times a day (QID) | ORAL | Status: DC | PRN

## 2016-04-06 MED ORDER — TRAMADOL HCL 50 MG PO TABS: 50.0000 mg | ORAL_TABLET | Freq: Four times a day (QID) | ORAL | Status: DC | PRN

## 2016-04-06 MED ORDER — OXYCODONE HCL 5 MG PO TABS: 5.0000 mg | ORAL_TABLET | ORAL | Status: DC | PRN

## 2016-04-06 NOTE — Progress Notes (Signed)
   Subjective: 1 Day Post-Op Procedure(s) (LRB): RIGHT TOTAL HIP ARTHROPLASTY ANTERIOR APPROACH (Right) Patient reports pain as mild.   Patient seen in rounds by Dr. Wynelle Link. Doing well.  Walked 30 feet day of surgery. Patient is well, but has had some minor complaints of pain in the hip, requiring pain medications We will start therapy today.  If they do well with therapy and meets all goals, then will allow home later this afternoon following therapy. Plan is to go Home after hospital stay.  Objective: Vital signs in last 24 hours: Temp:  [97.8 F (36.6 C)-98.4 F (36.9 C)] 97.8 F (36.6 C) (07/20 0540) Pulse Rate:  [56-82] 56 (07/20 0540) Resp:  [12-25] 16 (07/20 0540) BP: (101-157)/(59-98) 111/59 mmHg (07/20 0540) SpO2:  [92 %-100 %] 100 % (07/20 0540) Weight:  [120.657 kg (266 lb)] 120.657 kg (266 lb) (07/19 1100)  Intake/Output from previous day:  Intake/Output Summary (Last 24 hours) at 04/06/16 0724 Last data filed at 04/06/16 [id number]  Gross per 24 hour  Intake   3090 ml  Output   6590 ml  Net  -3500 ml     Labs:  Recent Labs  04/06/16 0512  HGB 11.5*    Recent Labs  04/06/16 0512  WBC 10.8*  RBC 3.78*  HCT 34.0*  PLT 177    Recent Labs  04/06/16 0512  NA 140  K 4.5  CL 107  CO2 27  BUN 15  CREATININE 0.97  GLUCOSE 126*  CALCIUM 8.9   No results for input(s): LABPT, INR in the last 72 hours.  EXAM General - Patient is Alert, Appropriate and Oriented Extremity - Neurovascular intact Sensation intact distally Dorsiflexion/Plantar flexion intact Dressing - dressing C/D/I Motor Function - intact, moving foot and toes well on exam.  Hemovac pulled without difficulty.  Past Medical History  Diagnosis Date  . Carpal tunnel syndrome, bilateral   . GERD (gastroesophageal reflux disease)   . Hyperlipemia   . Arthritis   . Cancer (Ramirez-Perez)     skin cancer  . Hypertension   . Wears glasses   . Shortness of breath dyspnea     with bending over     . Hypothyroidism   . Depression     in teenage years   . Bowel trouble     Stool leakage since colonscopy 02/2016    Assessment/Plan: 1 Day Post-Op Procedure(s) (LRB): RIGHT TOTAL HIP ARTHROPLASTY ANTERIOR APPROACH (Right) Principal Problem:   OA (osteoarthritis) of hip  Estimated body mass index is 35.1 kg/(m^2) as calculated from the following:   Height as of this encounter: 6\' 1"  (1.854 m).   Weight as of this encounter: 120.657 kg (266 lb). Advance diet Up with therapy Discharge home with home health  DVT Prophylaxis - Xarelto Weight Bearing As Tolerated right Leg Hemovac Pulled Begin Therapy  If meets goals and able to go home: Discharge home with home health Diet - Cardiac diet Follow up - in 2 weeks Activity - WBAT Disposition - Home Condition Upon Discharge - Good D/C Meds - See DC Summary DVT Prophylaxis - Xarelto  Arlee Muslim, PA-C Orthopaedic Surgery 04/06/2016, 7:24 AM

## 2016-04-06 NOTE — Progress Notes (Signed)
Physical Therapy Treatment Patient Details Name: Colin [name] MRN: [id number] DOB: 12-Feb-1951 Today's Date: 04/06/2016    History of Present Illness R DATHA, old multiple L hip surgeries    PT Comments    POD # 1 pm session Spouse present for education, "hands on" instruction on stairs.  Performed and educated on all THR TE's following HEP.  Instructed on proper tech, freq as well as use of ICE.    Follow Up Recommendations  Home health PT     Equipment Recommendations  None recommended by PT    Recommendations for Other Services       Precautions / Restrictions Precautions Precautions: Fall Restrictions Weight Bearing Restrictions: No Other Position/Activity Restrictions: WBAT    Mobility  Bed Mobility Overal bed mobility: Needs Assistance Bed Mobility: Supine to Sit     Supine to sit: Min guard     General bed mobility comments: Pt OOB in recliner but did assist back to bee  Transfers Overall transfer level: Needs assistance Equipment used: Rolling walker (2 wheeled) Transfers: Sit to/from Stand Sit to Stand: Supervision         General transfer comment: cues for hand and R leg position plus increased time  Ambulation/Gait Ambulation/Gait assistance: Min guard Ambulation Distance (Feet): 174 Feet Assistive device: Rolling walker (2 wheeled) Gait Pattern/deviations: Step-through pattern Gait velocity: decreased   General Gait Details: tolerated increased distance     Instructed on equal stride length and stance time.     Stairs Stairs: Yes Stairs assistance: Min guard Stair Management: One rail Left;Step to pattern;Forwards;With crutches Number of Stairs: 4 General stair comments: with spouse present practiced stairs using one rail and one crutch with 25% VC's on proper tech and safety.    Wheelchair Mobility    Modified Rankin (Stroke Patients Only)       Balance                                    Cognition  Arousal/Alertness: Awake/alert Behavior During Therapy: WFL for tasks assessed/performed Overall Cognitive Status: Within Functional Limits for tasks assessed                      Exercises   Total Hip Replacement TE's 10 reps ankle pumps 10 reps knee presses 10 reps heel slides 10 reps SAQ's 10 reps ABD 10 reps all standing TE's Followed by ICE     General Comments        Pertinent Vitals/Pain Pain Assessment: 0-10 Pain Score: 4  Pain Location: R hip  Pain Descriptors / Indicators: Sore;Tightness Pain Intervention(s): Monitored during session;Premedicated before session;Repositioned;Ice applied    Home Living Family/patient expects to be discharged to:: Private residence Living Arrangements: Spouse/significant other Available Help at Discharge: Family Type of Home: House       Home Equipment: Gilford Rile - 2 wheels;Shower seat - built in;Hand held shower head;Bedside commode      Prior Function Level of Independence: Independent      Comments: works in long term Therapist, art   PT Goals (current goals can now be found in the care plan section) Acute Rehab PT Goals Patient Stated Goal: home today Progress towards PT goals: Progressing toward goals    Frequency  7X/week    PT Plan Current plan remains appropriate    Co-evaluation             End  of Session Equipment Utilized During Treatment: Gait belt Activity Tolerance: Patient tolerated treatment well Patient left: in chair;with call bell/phone within reach;with family/visitor present     Time: 1204-1300 PT Time Calculation (min) (ACUTE ONLY): 56 min  Charges:  $Gait Training: 23-37 mins $Therapeutic Exercise: 8-22 mins $Therapeutic Activity: 8-22 mins                    G Codes:      Rica Koyanagi  PTA [facility]  Acute  Rehab Pager      [phone]

## 2016-04-06 NOTE — Discharge Summary (Signed)
Physician Discharge Summary   Patient ID: Colin [name] MRN: [id number] DOB/AGE: 10-12-1950 65 y.o.  Admit date: 04/05/2016 Discharge date: 04/06/2016  Primary Diagnosis:  Osteoarthritis of the Right hip.  Admission Diagnoses:  Past Medical History  Diagnosis Date  . Carpal tunnel syndrome, bilateral   . GERD (gastroesophageal reflux disease)   . Hyperlipemia   . Arthritis   . Cancer ([place])     skin cancer  . Hypertension   . Wears glasses   . Shortness of breath dyspnea     with bending over   . Hypothyroidism   . Depression     in teenage years   . Bowel trouble     Stool leakage since colonscopy 02/2016   Discharge Diagnoses:   Principal Problem:   OA (osteoarthritis) of hip  Estimated body mass index is 35.1 kg/(m^2) as calculated from the following:   Height as of this encounter: '6\' 1"'  (1.854 m).   Weight as of this encounter: 120.657 kg (266 lb).  Procedure(s) (LRB): RIGHT TOTAL HIP ARTHROPLASTY ANTERIOR APPROACH (Right)   Consults: None  HPI: Colin [name] is a 65 y.o. male who has advanced end-  stage arthritis of his Right hip with progressively worsening pain and  dysfunction.The patient has failed nonoperative management and presents for  total hip arthroplasty.   Laboratory Data: Admission on 04/05/2016  Component Date Value Ref Range Status  . aPTT 04/05/2016 30  24 - 37 seconds Final  . Color, Urine 04/05/2016 YELLOW  YELLOW Final  . APPearance 04/05/2016 CLEAR  CLEAR Final  . Specific Gravity, Urine 04/05/2016 1.016  1.005 - 1.030 Final  . pH 04/05/2016 5.5  5.0 - 8.0 Final  . Glucose, UA 04/05/2016 NEGATIVE  NEGATIVE mg/dL Final  . Hgb urine dipstick 04/05/2016 NEGATIVE  NEGATIVE Final  . Bilirubin Urine 04/05/2016 NEGATIVE  NEGATIVE Final  . Ketones, ur 04/05/2016 NEGATIVE  NEGATIVE mg/dL Final  . Protein, ur 04/05/2016 NEGATIVE  NEGATIVE mg/dL Final  . Nitrite 04/05/2016 NEGATIVE  NEGATIVE Final  . Leukocytes, UA 04/05/2016 NEGATIVE   NEGATIVE Final   MICROSCOPIC NOT DONE ON URINES WITH NEGATIVE PROTEIN, BLOOD, LEUKOCYTES, NITRITE, OR GLUCOSE <1000 mg/dL.  . WBC 04/06/2016 10.8* 4.0 - 10.5 K/uL Final  . RBC 04/06/2016 3.78* 4.22 - 5.81 MIL/uL Final  . Hemoglobin 04/06/2016 11.5* 13.0 - 17.0 g/dL Final  . HCT 04/06/2016 34.0* 39.0 - 52.0 % Final  . MCV 04/06/2016 89.9  78.0 - 100.0 fL Final  . MCH 04/06/2016 30.4  26.0 - 34.0 pg Final  . MCHC 04/06/2016 33.8  30.0 - 36.0 g/dL Final  . RDW 04/06/2016 13.8  11.5 - 15.5 % Final  . Platelets 04/06/2016 177  150 - 400 K/uL Final  . Sodium 04/06/2016 140  135 - 145 mmol/L Final  . Potassium 04/06/2016 4.5  3.5 - 5.1 mmol/L Final  . Chloride 04/06/2016 107  101 - 111 mmol/L Final  . CO2 04/06/2016 27  22 - 32 mmol/L Final  . Glucose, Bld 04/06/2016 126* 65 - 99 mg/dL Final  . BUN 04/06/2016 15  6 - 20 mg/dL Final  . Creatinine, Ser 04/06/2016 0.97  0.61 - 1.24 mg/dL Final  . Calcium 04/06/2016 8.9  8.9 - 10.3 mg/dL Final  . GFR calc non Af Amer 04/06/2016 >60  >60 mL/min Final  . GFR calc Af Amer 04/06/2016 >60  >60 mL/min Final   Comment: (NOTE) The eGFR has been calculated using the CKD EPI equation. This  calculation has not been validated in all clinical situations. eGFR's persistently <60 mL/min signify possible Chronic Kidney Disease.   Georgiann Hahn gap 04/06/2016 6  5 - 15 Final  Hospital Outpatient Visit on 03/28/2016  Component Date Value Ref Range Status  . ABO/RH(D) 03/28/2016 O NEG   Final  . Antibody Screen 03/28/2016 NEG   Final  . Sample Expiration 03/28/2016 04/08/2016   Final  . Extend sample reason 03/28/2016 NO TRANSFUSIONS OR PREGNANCY IN THE PAST 3 MONTHS   Final  . Sodium 03/28/2016 138  135 - 145 mmol/L Final  . Potassium 03/28/2016 4.7  3.5 - 5.1 mmol/L Final  . Chloride 03/28/2016 105  101 - 111 mmol/L Final  . CO2 03/28/2016 27  22 - 32 mmol/L Final  . Glucose, Bld 03/28/2016 98  65 - 99 mg/dL Final  . BUN 03/28/2016 19  6 - 20 mg/dL Final  .  Creatinine, Ser 03/28/2016 0.95  0.61 - 1.24 mg/dL Final  . Calcium 03/28/2016 9.4  8.9 - 10.3 mg/dL Final  . GFR calc non Af Amer 03/28/2016 >60  >60 mL/min Final  . GFR calc Af Amer 03/28/2016 >60  >60 mL/min Final   Comment: (NOTE) The eGFR has been calculated using the CKD EPI equation. This calculation has not been validated in all clinical situations. eGFR's persistently <60 mL/min signify possible Chronic Kidney Disease.   . Anion gap 03/28/2016 6  5 - 15 Final  . WBC 03/28/2016 5.0  4.0 - 10.5 K/uL Final  . RBC 03/28/2016 4.66  4.22 - 5.81 MIL/uL Final  . Hemoglobin 03/28/2016 14.1  13.0 - 17.0 g/dL Final  . HCT 03/28/2016 42.3  39.0 - 52.0 % Final  . MCV 03/28/2016 90.8  78.0 - 100.0 fL Final  . MCH 03/28/2016 30.3  26.0 - 34.0 pg Final  . MCHC 03/28/2016 33.3  30.0 - 36.0 g/dL Final  . RDW 03/28/2016 13.8  11.5 - 15.5 % Final  . Platelets 03/28/2016 191  150 - 400 K/uL Final  . Prothrombin Time 03/28/2016 13.0  11.6 - 15.2 seconds Final  . INR 03/28/2016 0.96  0.00 - 1.49 Final  . ABO/RH(D) 03/28/2016 O NEG   Final  . MRSA, PCR 03/28/2016 NEGATIVE  NEGATIVE Final  . Staphylococcus aureus 03/28/2016 NEGATIVE  NEGATIVE Final   Comment:        The Xpert SA Assay (FDA approved for NASAL specimens in patients over 62 years of age), is one component of a comprehensive surveillance program.  Test performance has been validated by [facility] for patients greater than or equal to 12 year old. It is not intended to diagnose infection nor to guide or monitor treatment.      X-Rays:Dg Pelvis Portable  04/05/2016  CLINICAL DATA:  Status post right hip arthroplasty EXAM: DG C-ARM 1-60 MIN-NO REPORT; PORTABLE PELVIS 1-2 VIEWS COMPARISON:  April 16, 2009 FLUOROSCOPY TIME:  0 minutes 19 seconds. No acquired fluoroscopic images FINDINGS: There are total hip prostheses bilaterally. Prosthetic components on the right appear well seated. There appears to be a degree of loosening of the  femoral component of the prosthesis on the left, characterized by lucency surrounding this portion of the prosthesis. No acute fracture or dislocation. There is a surgical drain on the right. IMPRESSION: Total hip prosthesis on the right appears well-seated on frontal view. No fracture or dislocation. Question loosening of the left-sided prosthesis in the femoral aspect. Electronically Signed   By: Lowella Grip III M.D.  On: 04/05/2016 10:41   Dg C-arm 1-60 Min-no Report  04/05/2016  CLINICAL DATA: SURGERY C-ARM 1-60 MINUTES Fluoroscopy was utilized by the requesting physician.  No radiographic interpretation.    EKG: Orders placed or performed during the hospital encounter of 03/28/16  . EKG 12 lead  . EKG 12 lead     Hospital Course: Patient was admitted to [facility] and taken to the OR and underwent the above state procedure without complications.  Patient tolerated the procedure well and was later transferred to the recovery room and then to the orthopaedic floor for postoperative care.  They were given PO and IV analgesics for pain control following their surgery.  They were given 24 hours of postoperative antibiotics of  Anti-infectives    Start     Dose/Rate Route Frequency Ordered Stop   04/05/16 1430  ceFAZolin (ANCEF) IVPB 2g/100 mL premix     2 g 200 mL/hr over 30 Minutes Intravenous Every 6 hours 04/05/16 1107 04/05/16 2012   04/05/16 0600  ceFAZolin (ANCEF) 3 g in dextrose 5 % 50 mL IVPB     3 g 130 mL/hr over 30 Minutes Intravenous On call to O.R. 04/04/16 1345 04/05/16 0823     and started on DVT prophylaxis in the form of Xarelto.   PT and OT were ordered for total hip protocol.  The patient was allowed to be WBAT with therapy. Discharge planning was consulted to help with postop disposition and equipment needs.  Patient had a good night on the evening of surgery and got up and walked 30 feet that day.  They resumed therapy on day one.  Hemovac drain was pulled  without difficulty.  Dressing was checked and was clean and dry.  Patient was seen in rounds and felt that they would be ready to go home later that same day.  Discharge home with home health Diet - Cardiac diet Follow up - in 2 weeks Activity - WBAT Disposition - Home Condition Upon Discharge - Good D/C Meds - See DC Summary DVT Prophylaxis - Xarelto  Discharge Instructions    Call MD / Call 911    Complete by:  As directed   If you experience chest pain or shortness of breath, CALL 911 and be transported to the hospital emergency room.  If you develope a fever above 101 F, pus (white drainage) or increased drainage or redness at the wound, or calf pain, call your surgeon's office.     Change dressing    Complete by:  As directed   You may change your dressing dressing daily with sterile 4 x 4 inch gauze dressing and paper tape.  Do not submerge the incision under water.     Constipation Prevention    Complete by:  As directed   Drink plenty of fluids.  Prune juice may be helpful.  You may use a stool softener, such as Colace (over the counter) 100 mg twice a day.  Use MiraLax (over the counter) for constipation as needed.     Diet - low sodium heart healthy    Complete by:  As directed      Discharge instructions    Complete by:  As directed   Pick up stool softner and laxative for home use following surgery while on pain medications. Do not submerge incision under water. Please use good hand washing techniques while changing dressing each day. May shower starting three days after surgery. Please use a clean towel to pat  the incision dry following showers. Continue to use ice for pain and swelling after surgery. Do not use any lotions or creams on the incision until instructed by your surgeon.  Total Hip Protocol.  Take Xarelto for two and a half more weeks, then discontinue Xarelto. Once the patient has completed the Xarelto, they may resume the 81 mg Aspirin.  Postoperative  Constipation Protocol  Constipation - defined medically as fewer than three stools per week and severe constipation as less than one stool per week.  One of the most common issues patients have following surgery is constipation.  Even if you have a regular bowel pattern at home, your normal regimen is likely to be disrupted due to multiple reasons following surgery.  Combination of anesthesia, postoperative narcotics, change in appetite and fluid intake all can affect your bowels.  In order to avoid complications following surgery, here are some recommendations in order to help you during your recovery period.  Colace (docusate) - Pick up an over-the-counter form of Colace or another stool softener and take twice a day as long as you are requiring postoperative pain medications.  Take with a full glass of water daily.  If you experience loose stools or diarrhea, hold the colace until you stool forms back up.  If your symptoms do not get better within 1 week or if they get worse, check with your doctor.  Dulcolax (bisacodyl) - Pick up over-the-counter and take as directed by the product packaging as needed to assist with the movement of your bowels.  Take with a full glass of water.  Use this product as needed if not relieved by Colace only.   MiraLax (polyethylene glycol) - Pick up over-the-counter to have on hand.  MiraLax is a solution that will increase the amount of water in your bowels to assist with bowel movements.  Take as directed and can mix with a glass of water, juice, soda, coffee, or tea.  Take if you go more than two days without a movement. Do not use MiraLax more than once per day. Call your doctor if you are still constipated or irregular after using this medication for 7 days in a row.  If you continue to have problems with postoperative constipation, please contact the office for further assistance and recommendations.  If you experience "the worst abdominal pain ever" or develop  nausea or vomiting, please contact the office immediatly for further recommendations for treatment.     Do not sit on low chairs, stoools or toilet seats, as it may be difficult to get up from low surfaces    Complete by:  As directed      Driving restrictions    Complete by:  As directed   No driving until released by the physician.     Increase activity slowly as tolerated    Complete by:  As directed      Lifting restrictions    Complete by:  As directed   No lifting until released by the physician.     Patient may shower    Complete by:  As directed   You may shower without a dressing once there is no drainage.  Do not wash over the wound.  If drainage remains, do not shower until drainage stops.     TED hose    Complete by:  As directed   Use stockings (TED hose) for 3 weeks on both leg(s).  You may remove them at night for sleeping.  Weight bearing as tolerated    Complete by:  As directed   Laterality:  right  Extremity:  Lower            Medication List    STOP taking these medications        amoxicillin 500 MG tablet  Commonly known as:  AMOXIL     aspirin 81 MG tablet     fish oil-omega-3 fatty acids 1000 MG capsule     fluorouracil 5 % cream  Commonly known as:  EFUDEX     ibuprofen 200 MG tablet  Commonly known as:  ADVIL,MOTRIN     sildenafil 20 MG tablet  Commonly known as:  REVATIO      TAKE these medications        cetirizine 10 MG tablet  Commonly known as:  ZYRTEC  Take 10 mg by mouth daily as needed for allergies.     levothyroxine 50 MCG tablet  Commonly known as:  SYNTHROID, LEVOTHROID  Take 50 mcg by mouth daily.     losartan-hydrochlorothiazide 50-12.5 MG tablet  Commonly known as:  HYZAAR  Take 1 tablet by mouth daily.     lovastatin 40 MG tablet  Commonly known as:  MEVACOR  Take 40 mg by mouth every morning.     methocarbamol 500 MG tablet  Commonly known as:  ROBAXIN  Take 1 tablet (500 mg total) by mouth every 6 (six)  hours as needed for muscle spasms.     omeprazole 40 MG capsule  Commonly known as:  PRILOSEC  Take 40 mg by mouth daily.     oxyCODONE 5 MG immediate release tablet  Commonly known as:  Oxy IR/ROXICODONE  Take 1-2 tablets (5-10 mg total) by mouth every 3 (three) hours as needed for moderate pain or severe pain.     rivaroxaban 10 MG Tabs tablet  Commonly known as:  XARELTO  Take 1 tablet (10 mg total) by mouth daily with breakfast. Take Xarelto for two and a half more weeks, then discontinue Xarelto. Once the patient has completed the Xarelto, they may resume the 81 mg Aspirin.     traMADol 50 MG tablet  Commonly known as:  ULTRAM  Take 1-2 tablets (50-100 mg total) by mouth every 6 (six) hours as needed (mild pain).           Follow-up Information    Follow up with Gearlean Alf, MD. Schedule an appointment as soon as possible for a visit on 04/18/2016.   Specialty:  Orthopedic Surgery   Why:  Call for appointment for Tuesday 04/18/2016 with Dr. Wynelle Link.   Contact information:   [address] [place] [address] [phone]       Signed: Arlee Muslim, PA-C Orthopaedic Surgery 04/06/2016, 7:33 AM

## 2016-04-06 NOTE — Progress Notes (Signed)
Physical Therapy Treatment Patient Details Name: Colin [name] MRN: [id number] DOB: [birth year] Today's Date: 04/06/2016    History of Present Illness R DATHA, old multiple L hip surgeries    PT Comments    POD # 1 am session Assisted OOB with increased time then amb in hallway increased distance.  NO c/o nausea, mild feeling "woozy".  Will allow a rest break then return to practice stairs and educate on HEP.    Follow Up Recommendations  Home health PT     Equipment Recommendations  None recommended by PT    Recommendations for Other Services       Precautions / Restrictions Precautions Precautions: Fall Restrictions Weight Bearing Restrictions: No Other Position/Activity Restrictions: WBAT    Mobility  Bed Mobility Overal bed mobility: Needs Assistance Bed Mobility: Supine to Sit     Supine to sit: Min guard     General bed mobility comments: instructed on how to use a long sheet/belt to assist with R LE OOB   Transfers Overall transfer level: Needs assistance Equipment used: Rolling walker (2 wheeled) Transfers: Sit to/from Stand Sit to Stand: Supervision         General transfer comment: cues for hand and R leg position plus increased time  Ambulation/Gait Ambulation/Gait assistance: Min guard Ambulation Distance (Feet): 125 Feet Assistive device: Rolling walker (2 wheeled) Gait Pattern/deviations: Step-through pattern Gait velocity: decreased   General Gait Details: tolerated increased distance     Instructed on equal stride length and stance time.     Stairs            Wheelchair Mobility    Modified Rankin (Stroke Patients Only)       Balance                                    Cognition Arousal/Alertness: Awake/alert Behavior During Therapy: WFL for tasks assessed/performed Overall Cognitive Status: Within Functional Limits for tasks assessed                      Exercises      General Comments         Pertinent Vitals/Pain Pain Assessment: 0-10 Pain Score: 4  Pain Location: R hip  Pain Descriptors / Indicators: Sore;Tightness Pain Intervention(s): Monitored during session;Premedicated before session;Repositioned;Ice applied    Home Living Family/patient expects to be discharged to:: Private residence Living Arrangements: Spouse/significant other Available Help at Discharge: Family Type of Home: House       Home Equipment: Gilford Rile - 2 wheels;Shower seat - built in;Hand held shower head;Bedside commode      Prior Function Level of Independence: Independent      Comments: works in long term Therapist, art   PT Goals (current goals can now be found in the care plan section) Acute Rehab PT Goals Patient Stated Goal: home today Progress towards PT goals: Progressing toward goals    Frequency  7X/week    PT Plan Current plan remains appropriate    Co-evaluation             End of Session Equipment Utilized During Treatment: Gait belt Activity Tolerance: Patient tolerated treatment well Patient left: in chair;with call bell/phone within reach;with family/visitor present     Time: 0925-0950 PT Time Calculation (min) (ACUTE ONLY): 25 min  Charges:  $Gait Training: 8-22 mins $Therapeutic Activity: 8-22 mins  G Codes:      Rica Koyanagi  PTA [facility]  Acute  Rehab Pager      [phone]

## 2016-04-06 NOTE — Discharge Instructions (Addendum)
° °Dr. Frank Aluisio °Total Joint Specialist °[facility] °[address] °, [place] [address] °[phone] ° °ANTERIOR APPROACH TOTAL HIP REPLACEMENT POSTOPERATIVE DIRECTIONS ° ° °Hip Rehabilitation, Guidelines Following Surgery  °The results of a hip operation are greatly improved after range of motion and muscle strengthening exercises. Follow all safety measures which are given to protect your hip. If any of these exercises cause increased pain or swelling in your joint, decrease the amount until you are comfortable again. Then slowly increase the exercises. Call your caregiver if you have problems or questions.  ° °HOME CARE INSTRUCTIONS  °Remove items at home which could result in a fall. This includes throw rugs or furniture in walking pathways.  °· ICE to the affected hip every three hours for 30 minutes at a time and then as needed for pain and swelling.  Continue to use ice on the hip for pain and swelling from surgery. You may notice swelling that will progress down to the foot and ankle.  This is normal after surgery.  Elevate the leg when you are not up walking on it.   °· Continue to use the breathing machine which will help keep your temperature down.  It is common for your temperature to cycle up and down following surgery, especially at night when you are not up moving around and exerting yourself.  The breathing machine keeps your lungs expanded and your temperature down. ° ° °DIET °You may resume your previous home diet once your are discharged from the hospital. ° °DRESSING / WOUND CARE / SHOWERING °You may shower 3 days after surgery, but keep the wounds dry during showering.  You may use an occlusive plastic wrap (Press'n Seal for example), NO SOAKING/SUBMERGING IN THE BATHTUB.  If the bandage gets wet, change with a clean dry gauze.  If the incision gets wet, pat the wound dry with a clean towel. °You may start showering once you are discharged home but do not  submerge the incision under water. Just pat the incision dry and apply a dry gauze dressing on daily. °Change the surgical dressing daily and reapply a dry dressing each time. ° °ACTIVITY °Walk with your walker as instructed. °Use walker as long as suggested by your caregivers. °Avoid periods of inactivity such as sitting longer than an hour when not asleep. This helps prevent blood clots.  °You may resume a sexual relationship in one month or when given the OK by your doctor.  °You may return to work once you are cleared by your doctor.  °Do not drive a car for 6 weeks or until released by you surgeon.  °Do not drive while taking narcotics. ° °WEIGHT BEARING °Weight bearing as tolerated with assist device (walker, cane, etc) as directed, use it as long as suggested by your surgeon or therapist, typically at least 4-6 weeks. ° °POSTOPERATIVE CONSTIPATION PROTOCOL °Constipation - defined medically as fewer than three stools per week and severe constipation as less than one stool per week. ° °One of the most common issues patients have following surgery is constipation.  Even if you have a regular bowel pattern at home, your normal regimen is likely to be disrupted due to multiple reasons following surgery.  Combination of anesthesia, postoperative narcotics, change in appetite and fluid intake all can affect your bowels.  In order to avoid complications following surgery, here are some recommendations in order to help you during your recovery period. ° °Colace (docusate) - Pick up an over-the-counter   form of Colace or another stool softener and take twice a day as long as you are requiring postoperative pain medications.  Take with a full glass of water daily.  If you experience loose stools or diarrhea, hold the colace until you stool forms back up.  If your symptoms do not get better within 1 week or if they get worse, check with your doctor. ° °Dulcolax (bisacodyl) - Pick up over-the-counter and take as directed  by the product packaging as needed to assist with the movement of your bowels.  Take with a full glass of water.  Use this product as needed if not relieved by Colace only.  ° °MiraLax (polyethylene glycol) - Pick up over-the-counter to have on hand.  MiraLax is a solution that will increase the amount of water in your bowels to assist with bowel movements.  Take as directed and can mix with a glass of water, juice, soda, coffee, or tea.  Take if you go more than two days without a movement. °Do not use MiraLax more than once per day. Call your doctor if you are still constipated or irregular after using this medication for 7 days in a row. ° °If you continue to have problems with postoperative constipation, please contact the office for further assistance and recommendations.  If you experience "the worst abdominal pain ever" or develop nausea or vomiting, please contact the office immediatly for further recommendations for treatment. ° °ITCHING ° If you experience itching with your medications, try taking only a single pain pill, or even half a pain pill at a time.  You can also use Benadryl over the counter for itching or also to help with sleep.  ° °TED HOSE STOCKINGS °Wear the elastic stockings on both legs for three weeks following surgery during the day but you may remove then at night for sleeping. ° °MEDICATIONS °See your medication summary on the “After Visit Summary” that the nursing staff will review with you prior to discharge.  You may have some home medications which will be placed on hold until you complete the course of blood thinner medication.  It is important for you to complete the blood thinner medication as prescribed by your surgeon.  Continue your approved medications as instructed at time of discharge. ° °PRECAUTIONS °If you experience chest pain or shortness of breath - call 911 immediately for transfer to the hospital emergency department.  °If you develop a fever greater that 101 F,  purulent drainage from wound, increased redness or drainage from wound, foul odor from the wound/dressing, or calf pain - CONTACT YOUR SURGEON.   °                                                °FOLLOW-UP APPOINTMENTS °Make sure you keep all of your appointments after your operation with your surgeon and caregivers. You should call the office at the above phone number and make an appointment for approximately two weeks after the date of your surgery or on the date instructed by your surgeon outlined in the "After Visit Summary". ° °RANGE OF MOTION AND STRENGTHENING EXERCISES  °These exercises are designed to help you keep full movement of your hip joint. Follow your caregiver's or physical therapist's instructions. Perform all exercises about fifteen times, three times per day or as directed. Exercise both hips, even if you   have had only one joint replacement. These exercises can be done on a training (exercise) mat, on the floor, on a table or on a bed. Use whatever works the best and is most comfortable for you. Use music or television while you are exercising so that the exercises are a pleasant break in your day. This will make your life better with the exercises acting as a break in routine you can look forward to.  Lying on your back, slowly slide your foot toward your buttocks, raising your knee up off the floor. Then slowly slide your foot back down until your leg is straight again.  Lying on your back spread your legs as far apart as you can without causing discomfort.  Lying on your side, raise your upper leg and foot straight up from the floor as far as is comfortable. Slowly lower the leg and repeat.  Lying on your back, tighten up the muscle in the front of your thigh (quadriceps muscles). You can do this by keeping your leg straight and trying to raise your heel off the floor. This helps strengthen the largest muscle supporting your knee.  Lying on your back, tighten up the muscles of your  buttocks both with the legs straight and with the knee bent at a comfortable angle while keeping your heel on the floor.   IF YOU ARE TRANSFERRED TO A SKILLED REHAB FACILITY If the patient is transferred to a skilled rehab facility following release from the hospital, a list of the current medications will be sent to the facility for the patient to continue.  When discharged from the skilled rehab facility, please have the facility set up the patient's [place] prior to being released. Also, the skilled facility will be responsible for providing the patient with their medications at time of release from the facility to include their pain medication, the muscle relaxants, and their blood thinner medication. If the patient is still at the rehab facility at time of the two week follow up appointment, the skilled rehab facility will also need to assist the patient in arranging follow up appointment in our office and any transportation needs.  MAKE SURE YOU:  Understand these instructions.  Get help right away if you are not doing well or get worse.    Pick up stool softner and laxative for home use following surgery while on pain medications. Do not submerge incision under water. Please use good hand washing techniques while changing dressing each day. May shower starting three days after surgery. Please use a clean towel to pat the incision dry following showers. Continue to use ice for pain and swelling after surgery. Do not use any lotions or creams on the incision until instructed by your surgeon.  Take Xarelto for two and a half more weeks, then discontinue Xarelto. Once the patient has completed the Xarelto, they may resume the 81 mg Aspirin.  Information on my medicine - XARELTO (Rivaroxaban)  This medication education was reviewed with me or my healthcare representative as part of my discharge preparation.  The pharmacist that spoke with me during my hospital stay was:   Henreitta Leber. PharmD Why was Xarelto prescribed for you? Xarelto was prescribed for you to reduce the risk of blood clots forming after orthopedic surgery. The medical term for these abnormal blood clots is venous thromboembolism (VTE).  What do you need to know about xarelto ? Take your Xarelto ONCE DAILY at the same time every day. You may  take it either with or without food.  If you have difficulty swallowing the tablet whole, you may crush it and mix in applesauce just prior to taking your dose.  Take Xarelto exactly as prescribed by your doctor and DO NOT stop taking Xarelto without talking to the doctor who prescribed the medication.  Stopping without other VTE prevention medication to take the place of Xarelto may increase your risk of developing a clot.  After discharge, you should have regular check-up appointments with your healthcare provider that is prescribing your Xarelto.    What do you do if you miss a dose? If you miss a dose, take it as soon as you remember on the same day then continue your regularly scheduled once daily regimen the next day. Do not take two doses of Xarelto on the same day.   Important Safety Information A possible side effect of Xarelto is bleeding. You should call your healthcare provider right away if you experience any of the following: ? Bleeding from an injury or your nose that does not stop. ? Unusual colored urine (red or dark brown) or unusual colored stools (red or black). ? Unusual bruising for unknown reasons. ? A serious fall or if you hit your head (even if there is no bleeding).  Some medicines may interact with Xarelto and might increase your risk of bleeding while on Xarelto. To help avoid this, consult your healthcare provider or pharmacist prior to using any new prescription or non-prescription medications, including herbals, vitamins, non-steroidal anti-inflammatory drugs (NSAIDs) and supplements.  This website has more  information on Xarelto: [email or web address]

## 2016-04-06 NOTE — Care Management Note (Signed)
Case Management Note  Patient Details  Name: Colin [name] MRN: [id number] Date of Birth: May 31, 1951  Subjective/Objective:                  RIGHT TOTAL HIP ARTHROPLASTY ANTERIOR APPROACH (Right) Action/Plan: Discharge planning Expected Discharge Date:  04/06/16               Expected Discharge Plan:  [place]  In-House Referral:     Discharge planning Services  CM Consult  Post Acute Care Choice:  Home Health Choice offered to:  Patient  DME Arranged:  Crutches DME Agency:     HH Arranged:  PT [place]:  [facility] (now [facility])  Status of Service:  Completed, signed off  If discussed at [employer] of Stay Meetings, dates discussed:    Additional Comments: CM met with pt to offer choice of home health agency.  Pt chooses [facility] to render HHPT.  Referral called to [employer], Tim.  Pt declines any DME other than crutches which will be provided by ortho tech.  No other CM needs were communicated. Dellie Catholic, RN 04/06/2016, 11:17 AM

## 2016-04-06 NOTE — Progress Notes (Signed)
Occupational Therapy Evaluation Patient Details Name: AHYAN MOSBRUCKER MRN: [id number] DOB: 15-Aug-1951 Today's Date: 04/06/2016    History of Present Illness R DATHA, old multiple L hip surgeries   Clinical Impression   All OT education completed and pt questions answered. No further OT needs at this time. Will sign off.    Follow Up Recommendations  No OT follow up;Supervision - Intermittent    Equipment Recommendations  None recommended by OT    Recommendations for Other Services       Precautions / Restrictions Precautions Precautions: Fall Restrictions Weight Bearing Restrictions: No Other Position/Activity Restrictions: WBAT      Mobility Bed Mobility               General bed mobility comments: NT -- in recliner  Transfers Overall transfer level: Needs assistance Equipment used: Rolling walker (2 wheeled) Transfers: Sit to/from Stand Sit to Stand: Supervision              Balance                                            ADL Overall ADL's : Needs assistance/impaired Eating/Feeding: Independent;Sitting   Grooming: Set up;Sitting   Upper Body Bathing: Set up;Sitting   Lower Body Bathing: Minimal assistance;Sit to/from stand   Upper Body Dressing : Set up;Sitting   Lower Body Dressing: Minimal assistance;Sit to/from stand   Toilet Transfer: Supervision/safety;Ambulation;BSC;RW   Toileting- Clothing Manipulation and Hygiene: Supervision/safety;Sit to/from Nurse, children's Details (indicate cue type and reason): verbal education/demonstration of safe shower transfer technique but patient declined to practice Functional mobility during ADLs: Supervision/safety;Rolling walker       Vision     Perception     Praxis      Pertinent Vitals/Pain Pain Assessment: 0-10 Pain Score: 2  Pain Location: R hip Pain Descriptors / Indicators: Discomfort;Sore Pain Intervention(s): Limited activity within  patient's tolerance;Monitored during session;Repositioned     Hand Dominance     Extremity/Trunk Assessment Upper Extremity Assessment Upper Extremity Assessment: Overall WFL for tasks assessed   Lower Extremity Assessment Lower Extremity Assessment: Defer to PT evaluation   Cervical / Trunk Assessment Cervical / Trunk Assessment: Normal   Communication Communication Communication: No difficulties   Cognition Arousal/Alertness: Awake/alert Behavior During Therapy: WFL for tasks assessed/performed Overall Cognitive Status: Within Functional Limits for tasks assessed                     General Comments       Exercises       Shoulder Instructions      Home Living Family/patient expects to be discharged to:: Private residence Living Arrangements: Spouse/significant other Available Help at Discharge: Family Type of Home: House             Bathroom Shower/Tub: Walk-in Corporate treasurer Toilet: Standard Bathroom Accessibility: Yes How Accessible: Accessible via walker Home Equipment: Environmental consultant - 2 wheels;Shower seat - built in;Hand held shower head;Bedside commode          Prior Functioning/Environment Level of Independence: Independent        Comments: works in long term Therapist, art    OT Diagnosis: Acute pain   OT Problem List: Decreased strength;Decreased range of motion;Decreased knowledge of use of DME or AE;Pain   OT Treatment/Interventions:      OT  Goals(Current goals can be found in the care plan section) Acute Rehab OT Goals Patient Stated Goal: home today OT Goal Formulation: All assessment and education complete, DC therapy  OT Frequency:     Barriers to D/C:            Co-evaluation              End of Session Equipment Utilized During Treatment: Rolling walker  Activity Tolerance: Patient tolerated treatment well Patient left: in chair;with call bell/phone within reach;with family/visitor present   Time:  [id number] OT Time Calculation (min): 24 min Charges:  OT General Charges $OT Visit: 1 Procedure OT Evaluation $OT Eval Low Complexity: 1 Procedure OT Treatments $Self Care/Home Management : 8-22 mins G-Codes:    Jilian West A 07-Apr-2016, 12:10 PM

## 2016-12-08 ENCOUNTER — Other Ambulatory Visit (HOSPITAL_COMMUNITY): Payer: Self-pay | Admitting: Orthopedic Surgery

## 2016-12-08 DIAGNOSIS — Z96649 Presence of unspecified artificial hip joint: Secondary | ICD-10-CM

## 2016-12-13 ENCOUNTER — Encounter (HOSPITAL_COMMUNITY)
Admission: RE | Admit: 2016-12-13 | Discharge: 2016-12-13 | Disposition: A | Discharge status: Home / Self Care | Payer: 59 | Source: Ambulatory Visit | Attending: Orthopedic Surgery | Admitting: Orthopedic Surgery

## 2016-12-13 DIAGNOSIS — Z96649 Presence of unspecified artificial hip joint: Secondary | ICD-10-CM | POA: Diagnosis not present

## 2016-12-13 MED ORDER — TECHNETIUM TC 99M MEDRONATE IV KIT
21.4000 | PACK | Freq: Once | INTRAVENOUS | Status: AC | PRN
  Administered 2016-12-13: 21.4 via INTRAVENOUS

## 2017-12-04 DIAGNOSIS — M25511 Pain in right shoulder: Secondary | ICD-10-CM | POA: Diagnosis not present

## 2017-12-17 DIAGNOSIS — M25511 Pain in right shoulder: Secondary | ICD-10-CM | POA: Diagnosis not present

## 2017-12-21 DIAGNOSIS — M25511 Pain in right shoulder: Secondary | ICD-10-CM | POA: Diagnosis not present

## 2018-02-27 DIAGNOSIS — L570 Actinic keratosis: Secondary | ICD-10-CM | POA: Diagnosis not present

## 2018-02-27 DIAGNOSIS — L821 Other seborrheic keratosis: Secondary | ICD-10-CM | POA: Diagnosis not present

## 2018-04-30 DIAGNOSIS — R001 Bradycardia, unspecified: Secondary | ICD-10-CM | POA: Diagnosis not present

## 2018-04-30 DIAGNOSIS — E782 Mixed hyperlipidemia: Secondary | ICD-10-CM | POA: Diagnosis not present

## 2018-04-30 DIAGNOSIS — G4700 Insomnia, unspecified: Secondary | ICD-10-CM | POA: Diagnosis not present

## 2018-04-30 DIAGNOSIS — E039 Hypothyroidism, unspecified: Secondary | ICD-10-CM | POA: Diagnosis not present

## 2018-04-30 DIAGNOSIS — Z8669 Personal history of other diseases of the nervous system and sense organs: Secondary | ICD-10-CM | POA: Diagnosis not present

## 2018-04-30 DIAGNOSIS — K219 Gastro-esophageal reflux disease without esophagitis: Secondary | ICD-10-CM | POA: Diagnosis not present

## 2018-04-30 DIAGNOSIS — Z0000 Encounter for general adult medical examination without abnormal findings: Secondary | ICD-10-CM | POA: Diagnosis not present

## 2018-04-30 DIAGNOSIS — K432 Incisional hernia without obstruction or gangrene: Secondary | ICD-10-CM | POA: Diagnosis not present

## 2018-04-30 DIAGNOSIS — N529 Male erectile dysfunction, unspecified: Secondary | ICD-10-CM | POA: Diagnosis not present

## 2018-04-30 DIAGNOSIS — Z23 Encounter for immunization: Secondary | ICD-10-CM | POA: Diagnosis not present

## 2018-04-30 DIAGNOSIS — Z125 Encounter for screening for malignant neoplasm of prostate: Secondary | ICD-10-CM | POA: Diagnosis not present

## 2018-04-30 DIAGNOSIS — I10 Essential (primary) hypertension: Secondary | ICD-10-CM | POA: Diagnosis not present

## 2018-06-18 DIAGNOSIS — Z96643 Presence of artificial hip joint, bilateral: Secondary | ICD-10-CM | POA: Diagnosis not present

## 2018-07-29 DIAGNOSIS — M1712 Unilateral primary osteoarthritis, left knee: Secondary | ICD-10-CM | POA: Diagnosis not present

## 2018-07-29 DIAGNOSIS — M170 Bilateral primary osteoarthritis of knee: Secondary | ICD-10-CM | POA: Diagnosis not present

## 2018-07-29 DIAGNOSIS — M25561 Pain in right knee: Secondary | ICD-10-CM | POA: Diagnosis not present

## 2018-07-29 DIAGNOSIS — M25562 Pain in left knee: Secondary | ICD-10-CM | POA: Diagnosis not present

## 2018-07-31 DIAGNOSIS — M25561 Pain in right knee: Secondary | ICD-10-CM | POA: Diagnosis not present

## 2018-07-31 DIAGNOSIS — M1711 Unilateral primary osteoarthritis, right knee: Secondary | ICD-10-CM | POA: Diagnosis not present

## 2018-08-07 DIAGNOSIS — M1712 Unilateral primary osteoarthritis, left knee: Secondary | ICD-10-CM | POA: Diagnosis not present

## 2018-08-07 DIAGNOSIS — M25562 Pain in left knee: Secondary | ICD-10-CM | POA: Diagnosis not present

## 2018-08-08 DIAGNOSIS — M25561 Pain in right knee: Secondary | ICD-10-CM | POA: Diagnosis not present

## 2018-08-08 DIAGNOSIS — M1711 Unilateral primary osteoarthritis, right knee: Secondary | ICD-10-CM | POA: Diagnosis not present

## 2018-08-13 DIAGNOSIS — M1712 Unilateral primary osteoarthritis, left knee: Secondary | ICD-10-CM | POA: Diagnosis not present

## 2018-08-13 DIAGNOSIS — M25562 Pain in left knee: Secondary | ICD-10-CM | POA: Diagnosis not present

## 2018-08-14 DIAGNOSIS — M1711 Unilateral primary osteoarthritis, right knee: Secondary | ICD-10-CM | POA: Diagnosis not present

## 2018-08-14 DIAGNOSIS — M25561 Pain in right knee: Secondary | ICD-10-CM | POA: Diagnosis not present

## 2018-08-19 DIAGNOSIS — M1712 Unilateral primary osteoarthritis, left knee: Secondary | ICD-10-CM | POA: Diagnosis not present

## 2018-08-19 DIAGNOSIS — M25562 Pain in left knee: Secondary | ICD-10-CM | POA: Diagnosis not present

## 2018-08-21 DIAGNOSIS — M25561 Pain in right knee: Secondary | ICD-10-CM | POA: Diagnosis not present

## 2018-08-21 DIAGNOSIS — M1711 Unilateral primary osteoarthritis, right knee: Secondary | ICD-10-CM | POA: Diagnosis not present

## 2018-08-26 DIAGNOSIS — M25562 Pain in left knee: Secondary | ICD-10-CM | POA: Diagnosis not present

## 2018-08-26 DIAGNOSIS — M1712 Unilateral primary osteoarthritis, left knee: Secondary | ICD-10-CM | POA: Diagnosis not present

## 2018-08-27 DIAGNOSIS — M1711 Unilateral primary osteoarthritis, right knee: Secondary | ICD-10-CM | POA: Diagnosis not present

## 2018-08-27 DIAGNOSIS — M25561 Pain in right knee: Secondary | ICD-10-CM | POA: Diagnosis not present

## 2018-08-28 DIAGNOSIS — M170 Bilateral primary osteoarthritis of knee: Secondary | ICD-10-CM | POA: Diagnosis not present

## 2018-08-28 DIAGNOSIS — M25562 Pain in left knee: Secondary | ICD-10-CM | POA: Diagnosis not present

## 2018-08-28 DIAGNOSIS — M25561 Pain in right knee: Secondary | ICD-10-CM | POA: Diagnosis not present

## 2018-08-29 DIAGNOSIS — H524 Presbyopia: Secondary | ICD-10-CM | POA: Diagnosis not present

## 2018-08-29 DIAGNOSIS — H2513 Age-related nuclear cataract, bilateral: Secondary | ICD-10-CM | POA: Diagnosis not present

## 2018-08-29 DIAGNOSIS — H52223 Regular astigmatism, bilateral: Secondary | ICD-10-CM | POA: Diagnosis not present

## 2018-08-29 DIAGNOSIS — Z23 Encounter for immunization: Secondary | ICD-10-CM | POA: Diagnosis not present

## 2018-08-29 DIAGNOSIS — H5213 Myopia, bilateral: Secondary | ICD-10-CM | POA: Diagnosis not present

## 2018-09-04 DIAGNOSIS — Z8719 Personal history of other diseases of the digestive system: Secondary | ICD-10-CM | POA: Diagnosis not present

## 2018-09-04 DIAGNOSIS — Z9889 Other specified postprocedural states: Secondary | ICD-10-CM | POA: Diagnosis not present

## 2018-09-04 DIAGNOSIS — K432 Incisional hernia without obstruction or gangrene: Secondary | ICD-10-CM | POA: Diagnosis not present

## 2018-10-10 DIAGNOSIS — M170 Bilateral primary osteoarthritis of knee: Secondary | ICD-10-CM | POA: Diagnosis not present

## 2019-01-09 DIAGNOSIS — M1711 Unilateral primary osteoarthritis, right knee: Secondary | ICD-10-CM | POA: Diagnosis not present

## 2019-01-16 DIAGNOSIS — M1711 Unilateral primary osteoarthritis, right knee: Secondary | ICD-10-CM | POA: Diagnosis not present

## 2019-01-23 DIAGNOSIS — M1711 Unilateral primary osteoarthritis, right knee: Secondary | ICD-10-CM | POA: Diagnosis not present

## 2019-02-19 DIAGNOSIS — L821 Other seborrheic keratosis: Secondary | ICD-10-CM | POA: Diagnosis not present

## 2019-02-19 DIAGNOSIS — L570 Actinic keratosis: Secondary | ICD-10-CM | POA: Diagnosis not present

## 2019-02-19 DIAGNOSIS — D1801 Hemangioma of skin and subcutaneous tissue: Secondary | ICD-10-CM | POA: Diagnosis not present

## 2019-04-19 DIAGNOSIS — J189 Pneumonia, unspecified organism: Secondary | ICD-10-CM

## 2019-04-19 HISTORY — DX: Pneumonia, unspecified organism: J18.9

## 2019-05-09 DIAGNOSIS — Z1389 Encounter for screening for other disorder: Secondary | ICD-10-CM | POA: Diagnosis not present

## 2019-05-09 DIAGNOSIS — I10 Essential (primary) hypertension: Secondary | ICD-10-CM | POA: Diagnosis not present

## 2019-05-09 DIAGNOSIS — K219 Gastro-esophageal reflux disease without esophagitis: Secondary | ICD-10-CM | POA: Diagnosis not present

## 2019-05-09 DIAGNOSIS — R5383 Other fatigue: Secondary | ICD-10-CM | POA: Diagnosis not present

## 2019-05-09 DIAGNOSIS — G4700 Insomnia, unspecified: Secondary | ICD-10-CM | POA: Diagnosis not present

## 2019-05-09 DIAGNOSIS — E039 Hypothyroidism, unspecified: Secondary | ICD-10-CM | POA: Diagnosis not present

## 2019-05-09 DIAGNOSIS — Z0000 Encounter for general adult medical examination without abnormal findings: Secondary | ICD-10-CM | POA: Diagnosis not present

## 2019-05-09 DIAGNOSIS — N529 Male erectile dysfunction, unspecified: Secondary | ICD-10-CM | POA: Diagnosis not present

## 2019-05-09 DIAGNOSIS — K432 Incisional hernia without obstruction or gangrene: Secondary | ICD-10-CM | POA: Diagnosis not present

## 2019-05-09 DIAGNOSIS — E782 Mixed hyperlipidemia: Secondary | ICD-10-CM | POA: Diagnosis not present

## 2019-05-09 DIAGNOSIS — M2550 Pain in unspecified joint: Secondary | ICD-10-CM | POA: Diagnosis not present

## 2019-05-09 DIAGNOSIS — R05 Cough: Secondary | ICD-10-CM | POA: Diagnosis not present

## 2019-05-13 DIAGNOSIS — Z20828 Contact with and (suspected) exposure to other viral communicable diseases: Secondary | ICD-10-CM | POA: Diagnosis not present

## 2019-05-18 ENCOUNTER — Encounter (HOSPITAL_COMMUNITY): Payer: Self-pay

## 2019-05-18 ENCOUNTER — Emergency Department (HOSPITAL_COMMUNITY): Payer: Medicare Other

## 2019-05-18 ENCOUNTER — Inpatient Hospital Stay (HOSPITAL_COMMUNITY)
Admission: EM | Admit: 2019-05-18 | Discharge: 2019-05-27 | DRG: 177 | Disposition: A | Discharge status: Home Health | Payer: Medicare Other | Attending: Family Medicine | Admitting: Family Medicine

## 2019-05-18 ENCOUNTER — Other Ambulatory Visit: Payer: Self-pay

## 2019-05-18 DIAGNOSIS — Z79890 Hormone replacement therapy (postmenopausal): Secondary | ICD-10-CM

## 2019-05-18 DIAGNOSIS — J1289 Other viral pneumonia: Secondary | ICD-10-CM | POA: Diagnosis not present

## 2019-05-18 DIAGNOSIS — R0902 Hypoxemia: Secondary | ICD-10-CM | POA: Diagnosis not present

## 2019-05-18 DIAGNOSIS — E039 Hypothyroidism, unspecified: Secondary | ICD-10-CM | POA: Diagnosis present

## 2019-05-18 DIAGNOSIS — E871 Hypo-osmolality and hyponatremia: Secondary | ICD-10-CM

## 2019-05-18 DIAGNOSIS — Z79899 Other long term (current) drug therapy: Secondary | ICD-10-CM

## 2019-05-18 DIAGNOSIS — R0682 Tachypnea, not elsewhere classified: Secondary | ICD-10-CM | POA: Diagnosis not present

## 2019-05-18 DIAGNOSIS — Z801 Family history of malignant neoplasm of trachea, bronchus and lung: Secondary | ICD-10-CM

## 2019-05-18 DIAGNOSIS — J9811 Atelectasis: Secondary | ICD-10-CM | POA: Diagnosis not present

## 2019-05-18 DIAGNOSIS — Z96643 Presence of artificial hip joint, bilateral: Secondary | ICD-10-CM | POA: Diagnosis present

## 2019-05-18 DIAGNOSIS — Z8249 Family history of ischemic heart disease and other diseases of the circulatory system: Secondary | ICD-10-CM

## 2019-05-18 DIAGNOSIS — G4700 Insomnia, unspecified: Secondary | ICD-10-CM | POA: Diagnosis present

## 2019-05-18 DIAGNOSIS — J1282 Pneumonia due to coronavirus disease 2019: Secondary | ICD-10-CM

## 2019-05-18 DIAGNOSIS — Z803 Family history of malignant neoplasm of breast: Secondary | ICD-10-CM

## 2019-05-18 DIAGNOSIS — U071 COVID-19: Secondary | ICD-10-CM

## 2019-05-18 DIAGNOSIS — J9601 Acute respiratory failure with hypoxia: Secondary | ICD-10-CM | POA: Diagnosis not present

## 2019-05-18 DIAGNOSIS — F419 Anxiety disorder, unspecified: Secondary | ICD-10-CM | POA: Diagnosis present

## 2019-05-18 DIAGNOSIS — R0602 Shortness of breath: Secondary | ICD-10-CM | POA: Diagnosis not present

## 2019-05-18 DIAGNOSIS — Z85828 Personal history of other malignant neoplasm of skin: Secondary | ICD-10-CM

## 2019-05-18 DIAGNOSIS — E861 Hypovolemia: Secondary | ICD-10-CM | POA: Diagnosis present

## 2019-05-18 DIAGNOSIS — E785 Hyperlipidemia, unspecified: Secondary | ICD-10-CM | POA: Diagnosis present

## 2019-05-18 DIAGNOSIS — F329 Major depressive disorder, single episode, unspecified: Secondary | ICD-10-CM | POA: Diagnosis present

## 2019-05-18 DIAGNOSIS — R0600 Dyspnea, unspecified: Secondary | ICD-10-CM | POA: Diagnosis present

## 2019-05-18 DIAGNOSIS — I10 Essential (primary) hypertension: Secondary | ICD-10-CM | POA: Diagnosis present

## 2019-05-18 DIAGNOSIS — K219 Gastro-esophageal reflux disease without esophagitis: Secondary | ICD-10-CM | POA: Diagnosis present

## 2019-05-18 DIAGNOSIS — H9190 Unspecified hearing loss, unspecified ear: Secondary | ICD-10-CM | POA: Diagnosis present

## 2019-05-18 LAB — CBC WITH DIFFERENTIAL/PLATELET
Abs Immature Granulocytes: 0.05 10*3/uL (ref 0.00–0.07)
Basophils Absolute: 0 10*3/uL (ref 0.0–0.1)
Basophils Relative: 0 %
Eosinophils Absolute: 0 10*3/uL (ref 0.0–0.5)
Eosinophils Relative: 0 %
HCT: 43.2 % (ref 39.0–52.0)
Hemoglobin: 14.4 g/dL (ref 13.0–17.0)
Immature Granulocytes: 1 %
Lymphocytes Relative: 11 %
Lymphs Abs: 0.6 10*3/uL — ABNORMAL LOW (ref 0.7–4.0)
MCH: 30.2 pg (ref 26.0–34.0)
MCHC: 33.3 g/dL (ref 30.0–36.0)
MCV: 90.6 fL (ref 80.0–100.0)
Monocytes Absolute: 0.4 10*3/uL (ref 0.1–1.0)
Monocytes Relative: 6 %
Neutro Abs: 4.7 10*3/uL (ref 1.7–7.7)
Neutrophils Relative %: 82 %
Platelets: 176 10*3/uL (ref 150–400)
RBC: 4.77 MIL/uL (ref 4.22–5.81)
RDW: 13.2 % (ref 11.5–15.5)
WBC: 5.8 10*3/uL (ref 4.0–10.5)
nRBC: 0 % (ref 0.0–0.2)

## 2019-05-18 LAB — FIBRINOGEN: Fibrinogen: >800 mg/dL — ABNORMAL HIGH (ref 210–475)

## 2019-05-18 LAB — D-DIMER, QUANTITATIVE: D-Dimer, Quant: 1.97 ug/mL-FEU — ABNORMAL HIGH (ref 0.00–0.50)

## 2019-05-18 NOTE — ED Notes (Addendum)
Pt laying comfortably in bed. Talking in complete sentences. No acute distress. Bed in locked and lowest position and call bell within reach.

## 2019-05-18 NOTE — ED Notes (Signed)
XR at bedside

## 2019-05-18 NOTE — ED Triage Notes (Signed)
Pt states that he is extremely tired. Reports that he was dx'd with COVID on Wednesday. He states, "I feel like I'm going to die." He is unable to sit up straight d/t fatigue. Pt endorses SOB when he coughs. Sats 88%-90% on RA.

## 2019-05-19 ENCOUNTER — Encounter (HOSPITAL_COMMUNITY): Payer: Self-pay

## 2019-05-19 DIAGNOSIS — J1282 Pneumonia due to coronavirus disease 2019: Secondary | ICD-10-CM | POA: Diagnosis present

## 2019-05-19 DIAGNOSIS — U071 COVID-19: Secondary | ICD-10-CM | POA: Diagnosis not present

## 2019-05-19 DIAGNOSIS — J9811 Atelectasis: Secondary | ICD-10-CM | POA: Diagnosis present

## 2019-05-19 DIAGNOSIS — H9190 Unspecified hearing loss, unspecified ear: Secondary | ICD-10-CM | POA: Diagnosis present

## 2019-05-19 DIAGNOSIS — E039 Hypothyroidism, unspecified: Secondary | ICD-10-CM | POA: Diagnosis not present

## 2019-05-19 DIAGNOSIS — J9601 Acute respiratory failure with hypoxia: Secondary | ICD-10-CM

## 2019-05-19 DIAGNOSIS — R0602 Shortness of breath: Secondary | ICD-10-CM | POA: Diagnosis not present

## 2019-05-19 DIAGNOSIS — Z803 Family history of malignant neoplasm of breast: Secondary | ICD-10-CM | POA: Diagnosis not present

## 2019-05-19 DIAGNOSIS — F419 Anxiety disorder, unspecified: Secondary | ICD-10-CM | POA: Diagnosis present

## 2019-05-19 DIAGNOSIS — Z85828 Personal history of other malignant neoplasm of skin: Secondary | ICD-10-CM | POA: Diagnosis not present

## 2019-05-19 DIAGNOSIS — Z801 Family history of malignant neoplasm of trachea, bronchus and lung: Secondary | ICD-10-CM | POA: Diagnosis not present

## 2019-05-19 DIAGNOSIS — E785 Hyperlipidemia, unspecified: Secondary | ICD-10-CM | POA: Diagnosis not present

## 2019-05-19 DIAGNOSIS — Z79890 Hormone replacement therapy (postmenopausal): Secondary | ICD-10-CM | POA: Diagnosis not present

## 2019-05-19 DIAGNOSIS — Z8249 Family history of ischemic heart disease and other diseases of the circulatory system: Secondary | ICD-10-CM | POA: Diagnosis not present

## 2019-05-19 DIAGNOSIS — J1289 Other viral pneumonia: Secondary | ICD-10-CM | POA: Diagnosis not present

## 2019-05-19 DIAGNOSIS — E861 Hypovolemia: Secondary | ICD-10-CM | POA: Diagnosis present

## 2019-05-19 DIAGNOSIS — E871 Hypo-osmolality and hyponatremia: Secondary | ICD-10-CM | POA: Diagnosis not present

## 2019-05-19 DIAGNOSIS — F329 Major depressive disorder, single episode, unspecified: Secondary | ICD-10-CM | POA: Diagnosis present

## 2019-05-19 DIAGNOSIS — Z79899 Other long term (current) drug therapy: Secondary | ICD-10-CM | POA: Diagnosis not present

## 2019-05-19 DIAGNOSIS — K219 Gastro-esophageal reflux disease without esophagitis: Secondary | ICD-10-CM | POA: Diagnosis not present

## 2019-05-19 DIAGNOSIS — G4700 Insomnia, unspecified: Secondary | ICD-10-CM | POA: Diagnosis present

## 2019-05-19 DIAGNOSIS — I10 Essential (primary) hypertension: Secondary | ICD-10-CM | POA: Diagnosis not present

## 2019-05-19 DIAGNOSIS — Z96643 Presence of artificial hip joint, bilateral: Secondary | ICD-10-CM | POA: Diagnosis present

## 2019-05-19 LAB — TRIGLYCERIDES: Triglycerides: 33 mg/dL (ref <150)

## 2019-05-19 LAB — COMPREHENSIVE METABOLIC PANEL
ALT: 31 U/L (ref 0–44)
AST: 54 U/L — ABNORMAL HIGH (ref 15–41)
Albumin: 3.3 g/dL — ABNORMAL LOW (ref 3.5–5.0)
Alkaline Phosphatase: 49 U/L (ref 38–126)
Anion gap: 14 (ref 5–15)
BUN: 16 mg/dL (ref 8–23)
CO2: 22 mmol/L (ref 22–32)
Calcium: 8.6 mg/dL — ABNORMAL LOW (ref 8.9–10.3)
Chloride: 97 mmol/L — ABNORMAL LOW (ref 98–111)
Creatinine, Ser: 0.92 mg/dL (ref 0.61–1.24)
GFR calc Af Amer: >60 mL/min (ref >60)
GFR calc non Af Amer: >60 mL/min (ref >60)
Glucose, Bld: 107 mg/dL — ABNORMAL HIGH (ref 70–99)
Potassium: 3.9 mmol/L (ref 3.5–5.1)
Sodium: 133 mmol/L — ABNORMAL LOW (ref 135–145)
Total Bilirubin: 0.6 mg/dL (ref 0.3–1.2)
Total Protein: 7.6 g/dL (ref 6.5–8.1)

## 2019-05-19 LAB — FERRITIN: Ferritin: 504 ng/mL — ABNORMAL HIGH (ref 24–336)

## 2019-05-19 LAB — HIV ANTIBODY (ROUTINE TESTING W REFLEX): HIV Screen 4th Generation wRfx: NONREACTIVE

## 2019-05-19 LAB — SARS CORONAVIRUS 2 BY RT PCR (HOSPITAL ORDER, PERFORMED IN ~~LOC~~ HOSPITAL LAB): SARS Coronavirus 2: POSITIVE — AB

## 2019-05-19 LAB — ABO/RH: ABO/RH(D): O NEG

## 2019-05-19 LAB — LACTIC ACID, PLASMA: Lactic Acid, Venous: 1.2 mmol/L (ref 0.5–1.9)

## 2019-05-19 LAB — PROCALCITONIN: Procalcitonin: <0.1 ng/mL

## 2019-05-19 LAB — C-REACTIVE PROTEIN: CRP: 10.2 mg/dL — ABNORMAL HIGH (ref <1.0)

## 2019-05-19 LAB — LACTATE DEHYDROGENASE: LDH: 446 U/L — ABNORMAL HIGH (ref 98–192)

## 2019-05-19 MED ORDER — ENOXAPARIN SODIUM 40 MG/0.4ML ~~LOC~~ SOLN
40.0000 mg | Freq: Two times a day (BID) | SUBCUTANEOUS | Status: DC
  Administered 2019-05-19 – 2019-05-27 (×18): 40 mg via SUBCUTANEOUS
  Filled 2019-05-19 (×19): qty 0.4

## 2019-05-19 MED ORDER — SODIUM CHLORIDE 0.9 % IV SOLN
200.0000 mg | Freq: Once | INTRAVENOUS | Status: AC
  Administered 2019-05-19: 05:00:00 200 mg via INTRAVENOUS
  Filled 2019-05-19: qty 40

## 2019-05-19 MED ORDER — SODIUM CHLORIDE 0.9 % IV SOLN
INTRAVENOUS | Status: DC
  Administered 2019-05-19 – 2019-05-20 (×2): via INTRAVENOUS

## 2019-05-19 MED ORDER — ONDANSETRON HCL 4 MG/2ML IJ SOLN: 4.0000 mg | Freq: Four times a day (QID) | INTRAMUSCULAR | Status: DC | PRN

## 2019-05-19 MED ORDER — HYDROCOD POLST-CPM POLST ER 10-8 MG/5ML PO SUER
5.0000 mL | Freq: Two times a day (BID) | ORAL | Status: DC | PRN
  Administered 2019-05-19 – 2019-05-26 (×4): 5 mL via ORAL
  Filled 2019-05-19 (×4): qty 5

## 2019-05-19 MED ORDER — PANTOPRAZOLE SODIUM 40 MG PO TBEC
40.0000 mg | DELAYED_RELEASE_TABLET | Freq: Every day | ORAL | Status: DC
  Administered 2019-05-19 – 2019-05-27 (×9): 40 mg via ORAL
  Filled 2019-05-19 (×9): qty 1

## 2019-05-19 MED ORDER — LOSARTAN POTASSIUM-HCTZ 50-12.5 MG PO TABS: 1.0000 | ORAL_TABLET | Freq: Every day | ORAL | Status: DC

## 2019-05-19 MED ORDER — PRAVASTATIN SODIUM 40 MG PO TABS
40.0000 mg | ORAL_TABLET | Freq: Every day | ORAL | Status: DC
  Administered 2019-05-19 – 2019-05-26 (×8): 40 mg via ORAL
  Filled 2019-05-19 (×8): qty 1

## 2019-05-19 MED ORDER — SODIUM CHLORIDE 0.9 % IV SOLN
500.0000 mg | INTRAVENOUS | Status: DC
  Administered 2019-05-19: 500 mg via INTRAVENOUS
  Filled 2019-05-19: qty 500

## 2019-05-19 MED ORDER — ACETAMINOPHEN 325 MG PO TABS
650.0000 mg | ORAL_TABLET | Freq: Four times a day (QID) | ORAL | Status: DC | PRN
  Administered 2019-05-26 (×2): 650 mg via ORAL
  Filled 2019-05-19 (×2): qty 2

## 2019-05-19 MED ORDER — SODIUM CHLORIDE 0.9 % IV SOLN
1.0000 g | INTRAVENOUS | Status: DC
  Administered 2019-05-19: 1 g via INTRAVENOUS
  Filled 2019-05-19: qty 10

## 2019-05-19 MED ORDER — GUAIFENESIN-DM 100-10 MG/5ML PO SYRP
10.0000 mL | ORAL_SOLUTION | ORAL | Status: DC | PRN
  Administered 2019-05-19 – 2019-05-21 (×2): 10 mL via ORAL
  Filled 2019-05-19 (×2): qty 10

## 2019-05-19 MED ORDER — LORATADINE 10 MG PO TABS
10.0000 mg | ORAL_TABLET | Freq: Every day | ORAL | Status: DC
  Administered 2019-05-19 – 2019-05-27 (×9): 10 mg via ORAL
  Filled 2019-05-19 (×9): qty 1

## 2019-05-19 MED ORDER — ACETAMINOPHEN 325 MG PO TABS
650.0000 mg | ORAL_TABLET | Freq: Once | ORAL | Status: AC
  Administered 2019-05-19: 650 mg via ORAL
  Filled 2019-05-19: qty 2

## 2019-05-19 MED ORDER — ONDANSETRON HCL 4 MG PO TABS: 4.0000 mg | ORAL_TABLET | Freq: Four times a day (QID) | ORAL | Status: DC | PRN

## 2019-05-19 MED ORDER — SODIUM CHLORIDE 0.9 % IV SOLN
100.0000 mg | INTRAVENOUS | Status: DC
  Filled 2019-05-19: qty 20

## 2019-05-19 MED ORDER — DEXAMETHASONE 6 MG PO TABS
6.0000 mg | ORAL_TABLET | ORAL | Status: DC
  Administered 2019-05-19 – 2019-05-21 (×3): 6 mg via ORAL
  Filled 2019-05-19 (×3): qty 1

## 2019-05-19 MED ORDER — SODIUM CHLORIDE 0.9 % IV SOLN
100.0000 mg | INTRAVENOUS | Status: AC
  Administered 2019-05-20 – 2019-05-23 (×4): 100 mg via INTRAVENOUS
  Filled 2019-05-19 (×4): qty 20

## 2019-05-19 MED ORDER — LEVOTHYROXINE SODIUM 75 MCG PO TABS
75.0000 ug | ORAL_TABLET | Freq: Every day | ORAL | Status: DC
  Administered 2019-05-19 – 2019-05-27 (×9): 75 ug via ORAL
  Filled 2019-05-19 (×9): qty 1

## 2019-05-19 NOTE — ED Notes (Signed)
ED TO INPATIENT HANDOFF REPORT  Name/Age/Gender Colin [name] 68 y.o. male  Code Status Code Status History    Date Active Date Inactive Code Status Order ID Comments User Context   04/05/2016 1107 04/06/2016 1718 Full Code [id number]  Gaynelle Arabian, MD Inpatient   Advance Care Planning Activity      Home/SNF/Other Home  Chief Complaint Covid Positive, hard to talk, faati. fever, dizzy, no app.  Level of Care/Admitting Diagnosis ED Disposition    ED Disposition Condition [facility] Area: [place] [[id number]]  Level of Care: Telemetry [5]  Covid Evaluation: Confirmed COVID Positive  Diagnosis: Pneumonia due to COVID-19 virus [id number]  Admitting Physician: Elwyn Reach [[id number]]  Attending Physician: Elwyn Reach [[id number]]  Estimated length of stay: past midnight tomorrow  Certification:: I certify this patient will need inpatient services for at least 2 midnights  PT Class (Do Not Modify): Inpatient [101]  PT Acc Code (Do Not Modify): Private [1]       Medical History Past Medical History:  Diagnosis Date  . Arthritis   . Bowel trouble    Stool leakage since colonscopy 02/2016  . Cancer ([place])    skin cancer  . Carpal tunnel syndrome, bilateral   . Depression    in teenage years   . GERD (gastroesophageal reflux disease)   . Hyperlipemia   . Hypertension   . Hypothyroidism   . Shortness of breath dyspnea    with bending over   . Wears glasses     Allergies No Known Allergies  IV Location/Drains/Wounds Patient Lines/Drains/Airways Status   Active Line/Drains/Airways    Name:   Placement date:   Placement time:   Site:   Days:   Peripheral IV 04/05/16 Left Hand   04/05/16    0759    Hand   1139   Peripheral IV 05/18/19 Right Antecubital   05/18/19    2305    Antecubital   1   Peripheral IV 05/18/19 Left Antecubital   05/18/19    2315    Antecubital   1   Closed System Drain 1 Right Hip Accordion (Hemovac) 10 Fr.    04/05/16    0940    Hip   1139   Incision 04/23/12 Hand Left   04/23/12    1153     2582   Incision 06/27/12 Hand Right   06/27/12    1201     2517   Incision (Closed) 04/05/16 Hip Right   04/05/16    0927     1139          Labs/Imaging Results for orders placed or performed during the hospital encounter of 05/18/19 (from the past 48 hour(s))  SARS Coronavirus 2 [facility] order, Performed in [facility] hospital lab) Nasopharyngeal Nasopharyngeal Swab     Status: Abnormal   Collection Time: 05/18/19 10:40 PM   Specimen: Nasopharyngeal Swab  Result Value Ref Range   SARS Coronavirus 2 POSITIVE (A) NEGATIVE    Comment: RESULT CALLED TO, READ BACK BY AND VERIFIED WITH: Sharra Cayabyab,S RN @0212  ON 05/19/2019 JACKSON,K (NOTE) If result is NEGATIVE SARS-CoV-2 target nucleic acids are NOT DETECTED. The SARS-CoV-2 RNA is generally detectable in upper and lower  respiratory specimens during the acute phase of infection. The lowest  concentration of SARS-CoV-2 viral copies this assay can detect is 250  copies / mL. A negative result does not preclude SARS-CoV-2 infection  and should not be  used as the sole basis for treatment or other  patient management decisions.  A negative result may occur with  improper specimen collection / handling, submission of specimen other  than nasopharyngeal swab, presence of viral mutation(s) within the  areas targeted by this assay, and inadequate number of viral copies  (<250 copies / mL). A negative result must be combined with clinical  observations, patient history, and epidemiological information. If result is POSITIVE SARS-CoV-2 target nucleic acids are DETECTE D. The SARS-CoV-2 RNA is generally detectable in upper and lower  respiratory specimens during the acute phase of infection.  Positive  results are indicative of active infection with SARS-CoV-2.  Clinical  correlation with patient history and other diagnostic information is  necessary to determine  patient infection status.  Positive results do  not rule out bacterial infection or co-infection with other viruses. If result is PRESUMPTIVE POSTIVE SARS-CoV-2 nucleic acids MAY BE PRESENT.   A presumptive positive result was obtained on the submitted specimen  and confirmed on repeat testing.  While 2019 novel coronavirus  (SARS-CoV-2) nucleic acids may be present in the submitted sample  additional confirmatory testing may be necessary for epidemiological  and / or clinical management purposes  to differentiate between  SARS-CoV-2 and other Sarbecovirus currently known to infect humans.  If clinically indicated additional testing with an alternate test  methodology (LAB745 3) is advised. The SARS-CoV-2 RNA is generally  detectable in upper and lower respiratory specimens during the acute  phase of infection. The expected result is Negative. Fact Sheet for Patients:  StrictlyIdeas.no Fact Sheet for Healthcare Providers: BankingDealers.co.za This test is not yet approved or cleared by the [place] FDA and has been authorized for detection and/or diagnosis of SARS-CoV-2 by FDA under an Emergency Use Authorization (EUA).  This EUA will remain in effect (meaning this test can be used) for the duration of the COVID-19 declaration under Section 564(b)(1) of the Act, 21 U.S.C. section 360bbb-3(b)(1), unless the authorization is terminated or revoked sooner. Performed at [facility], [place] [address] [place], [place] [address]   Lactic acid, plasma     Status: None   Collection Time: 05/18/19 10:40 PM  Result Value Ref Range   Lactic Acid, Venous 1.2 0.5 - 1.9 mmol/L    Comment: Performed at [facility], [place] [address] [place], [place] [address]  CBC WITH DIFFERENTIAL     Status: Abnormal   Collection Time: 05/18/19 10:40 PM  Result Value Ref Range   WBC 5.8 4.0 - 10.5 K/uL   RBC 4.77 4.22 -  5.81 MIL/uL   Hemoglobin 14.4 13.0 - 17.0 g/dL   HCT 43.2 39.0 - 52.0 %   MCV 90.6 80.0 - 100.0 fL   MCH 30.2 26.0 - 34.0 pg   MCHC 33.3 30.0 - 36.0 g/dL   RDW 13.2 11.5 - 15.5 %   Platelets 176 150 - 400 K/uL   nRBC 0.0 0.0 - 0.2 %   Neutrophils Relative % 82 %   Neutro Abs 4.7 1.7 - 7.7 K/uL   Lymphocytes Relative 11 %   Lymphs Abs 0.6 (L) 0.7 - 4.0 K/uL   Monocytes Relative 6 %   Monocytes Absolute 0.4 0.1 - 1.0 K/uL   Eosinophils Relative 0 %   Eosinophils Absolute 0.0 0.0 - 0.5 K/uL   Basophils Relative 0 %   Basophils Absolute 0.0 0.0 - 0.1 K/uL   Immature Granulocytes 1 %   Abs Immature Granulocytes 0.05  0.00 - 0.07 K/uL    Comment: Performed at [facility], [place] [address] [place], [place] [address]  Comprehensive metabolic panel     Status: Abnormal   Collection Time: 05/18/19 10:40 PM  Result Value Ref Range   Sodium 133 (L) 135 - 145 mmol/L   Potassium 3.9 3.5 - 5.1 mmol/L   Chloride 97 (L) 98 - 111 mmol/L   CO2 22 22 - 32 mmol/L   Glucose, Bld 107 (H) 70 - 99 mg/dL   BUN 16 8 - 23 mg/dL   Creatinine, Ser 0.92 0.61 - 1.24 mg/dL   Calcium 8.6 (L) 8.9 - 10.3 mg/dL   Total Protein 7.6 6.5 - 8.1 g/dL   Albumin 3.3 (L) 3.5 - 5.0 g/dL   AST 54 (H) 15 - 41 U/L   ALT 31 0 - 44 U/L   Alkaline Phosphatase 49 38 - 126 U/L   Total Bilirubin 0.6 0.3 - 1.2 mg/dL   GFR calc non Af Amer >60 >60 mL/min   GFR calc Af Amer >60 >60 mL/min   Anion gap 14 5 - 15    Comment: Performed at [facility], [place] [address], [place], [place] [address]  D-dimer, quantitative     Status: Abnormal   Collection Time: 05/18/19 10:40 PM  Result Value Ref Range   D-Dimer, Quant 1.97 (H) 0.00 - 0.50 ug/mL-FEU    Comment: (NOTE) At the manufacturer cut-off of 0.50 ug/mL FEU, this assay has been documented to exclude PE with a sensitivity and negative predictive value of 97 to 99%.  At this time, this assay has not been approved by the FDA to exclude  DVT/VTE. Results should be correlated with clinical presentation. Performed at [facility], [place] [address], [place], [place] [address]   Procalcitonin     Status: None   Collection Time: 05/18/19 10:40 PM  Result Value Ref Range   Procalcitonin <0.10 ng/mL    Comment:        Interpretation: PCT (Procalcitonin) <= 0.5 ng/mL: Systemic infection (sepsis) is not likely. Local bacterial infection is possible. (NOTE)       Sepsis PCT Algorithm           Lower Respiratory Tract                                      Infection PCT Algorithm    ----------------------------     ----------------------------         PCT < 0.25 ng/mL                PCT < 0.10 ng/mL         Strongly encourage             Strongly discourage   discontinuation of antibiotics    initiation of antibiotics    ----------------------------     -----------------------------       PCT 0.25 - 0.50 ng/mL            PCT 0.10 - 0.25 ng/mL               OR       >80% decrease in PCT            Discourage initiation of  antibiotics      Encourage discontinuation           of antibiotics    ----------------------------     -----------------------------         PCT >= 0.50 ng/mL              PCT 0.26 - 0.50 ng/mL               AND        <80% decrease in PCT             Encourage initiation of                                             antibiotics       Encourage continuation           of antibiotics    ----------------------------     -----------------------------        PCT >= 0.50 ng/mL                  PCT > 0.50 ng/mL               AND         increase in PCT                  Strongly encourage                                      initiation of antibiotics    Strongly encourage escalation           of antibiotics                                     -----------------------------                                           PCT <= 0.25 ng/mL                                                  OR                                        > 80% decrease in PCT                                     Discontinue / Do not initiate                                             antibiotics Performed at [place] [address] [place], [place] [address]   Lactate dehydrogenase     Status: Abnormal   Collection Time: 05/18/19  10:40 PM  Result Value Ref Range   LDH 446 (H) 98 - 192 U/L    Comment: Performed at [facility], [place] [address] [place], [place] [address]  Ferritin     Status: Abnormal   Collection Time: 05/18/19 10:40 PM  Result Value Ref Range   Ferritin 504 (H) 24 - 336 ng/mL    Comment: Performed at [facility], [place] [address], [place], [place] [address]  Triglycerides     Status: None   Collection Time: 05/18/19 10:40 PM  Result Value Ref Range   Triglycerides 33 <150 mg/dL    Comment: Performed at [facility], [place] [address], [place], [place] [address]  Fibrinogen     Status: Abnormal   Collection Time: 05/18/19 10:40 PM  Result Value Ref Range   Fibrinogen >800 (H) 210 - 475 mg/dL    Comment: Performed at [facility], [place] [address], [place], [place] [address]  C-reactive protein     Status: Abnormal   Collection Time: 05/18/19 10:40 PM  Result Value Ref Range   CRP 10.2 (H) <1.0 mg/dL    Comment: Performed at [facility], [place] [address] [place], [place] [address]   Dg Chest Port 1 View  Result Date: 05/19/2019 CLINICAL DATA:  Initial evaluation for acute hypoxia, fatigue, recently diagnosed with COVID. EXAM: PORTABLE CHEST 1 VIEW COMPARISON:  Prior radiograph from 08/14/2012. FINDINGS: Cardiomegaly, stable. Mediastinal silhouette within normal limits. Aortic atherosclerosis. Lungs hypoinflated. Patchy and hazy bilateral airspace disease, which could reflect edema and/or infiltrates. No obvious pleural effusion. No  pneumothorax. No acute osseous finding. Cervical ACDF noted. IMPRESSION: 1. Patchy and hazy bilateral airspace disease, which could reflect edema and/or infiltrates. 2. Cardiomegaly with aortic atherosclerosis. Electronically Signed   By: Jeannine Boga M.D.   On: 05/19/2019 00:06    Pending Labs Unresulted Labs (From admission, onward)    Start     Ordered   05/18/19 2240  Blood Culture (routine x 2)  BLOOD CULTURE X 2,   STAT     05/18/19 2241   Signed and Held  HIV antibody (Routine Testing)  Once,   R     Signed and Held   Signed and Held  ABO/Rh  Once,   R     Signed and Held   Signed and Held  CBC with Differential/Platelet  Daily,   R     Signed and Held   Signed and Held  Comprehensive metabolic panel  Daily,   R     Signed and Held   Signed and Held  C-reactive protein  Daily,   R     Signed and Held   Signed and Held  D-dimer, quantitative (not at [facility])  Daily,   R     Signed and Held          Vitals/Pain Today's Vitals   05/18/19 2130 05/19/19 0001 05/19/19 0030 05/19/19 0230  BP: 124/78 116/74 128/76 102/70  Pulse: 66 61 63 (!) 58  Resp: (!) 23 (!) 31 (!) 26 18  Temp:      TempSrc:      SpO2: 96% 93% 90% 94%  Weight:      PainSc:        Isolation Precautions Airborne and Contact precautions  Medications Medications  acetaminophen (TYLENOL) tablet 650 mg (650 mg Oral Given 05/19/19 0129)    Mobility walks with person assist

## 2019-05-19 NOTE — ED Notes (Signed)
Date and time results received: 05/19/19 0212  Test: covid Critical Value: positive  Name of Provider Notified: Georgeanna Lea

## 2019-05-19 NOTE — Progress Notes (Signed)
Pharmacy: Remdesivir   Patient is a 68 y.o. male with COVID.  Pharmacy has been consulted for remdesivir dosing.   - ALT: 31 - CXR shows: 1. Patchy and hazy bilateral airspace disease, which could reflect edema and/or infiltrates.  - Pt is requiring supplemental oxygen: (2.5L [place])    A/P:  - Patient meets criteria for remdesivir. Will initiate remdesivir 200 mg once followed by 100 mg daily x 4 days.  - Daily CMET while on remdesivir - Will f/u pt's ALT and clinical condition

## 2019-05-19 NOTE — Progress Notes (Signed)
Lovenox per Pharmacy for DVT Prophylaxis    Pharmacy has been consulted from dosing enoxaparin (lovenox) in this patient for DVT prophylaxis.  The pharmacist has reviewed pertinent labs (Hgb _14.4__; PLT_176__), patient weight (_104__kg) and renal function (CrCl___mL/min) and decided that enoxaparin _40_mg SQ Q12Hrs is appropriate for this patient.  Thank you  Cyndia Diver PharmD, BCPS  05/19/2019, 3:17 AM

## 2019-05-19 NOTE — H&P (Signed)
History and Physical   DRAGAN JAUREQUI [id number] DOB: 12-12-1950 DOA: 05/18/2019  Referring MD/NP/PA: Dr. Betsey Holiday  PCP: Antony Contras, MD   Outpatient Specialists: None  Patient coming from: Home  Chief Complaint: Fever and shortness of breath  HPI: Colin [name] is a 68 y.o. male with medical history significant of hypertension, hyperlipidemia, GERD, osteoarthritis, history of skin cancer, decreased hearing, depression and carpal tunnel syndrome who was tested at [facility] week for COVID-19 and he was positive.  Patient has been fine but he has had gradual worsening of symptoms over the last week.  He has been feeling weak tired and now shortness of breath.  He has been having headache with fatigue.  He has been coughing and having myalgias.  Patient checked his oxygen sat at home and it was in the 80s tonight so he came to the ER.  He initially called his primary care physician who asked him to come to the ER.  He has not received any medications.  He has avoided taking NSAIDs.  Patient was seen in the ER.  COVID screen is again positive.  He was hypoxic now requiring oxygen.  He is therefore being admitted with cough with 19 pneumonia with oxygen requirement.  Patient will be admitted to the [facility]..  ED Course: Temperature is 101.1 blood pressure 130/79 pulse 58 respirate 31 oxygen sats 90% on room air 96% on 4 L.  White count is 5.8 hemoglobin 14.4 and platelets 176.  Sodium 133 potassium 3.9 chloride 97 CO2 20 BUN 16 creatinine 0.92 and calcium 8.6.  Procalcitonin is less than 0.10.  LDH is 440 triglyceride 33 ferritin 504 CRP of 10.2 lactic acid 1.2 procalcitonin less than 0.10 d-dimer is pending.  Patient is being admitted to [facility] with COVID-19 pneumonia.  Review of Systems: As per HPI otherwise 10 point review of systems negative.    Past Medical History:  Diagnosis Date  . Arthritis   . Bowel trouble    Stool leakage since colonscopy 02/2016  . Cancer  ([place])    skin cancer  . Carpal tunnel syndrome, bilateral   . Depression    in teenage years   . GERD (gastroesophageal reflux disease)   . Hyperlipemia   . Hypertension   . Hypothyroidism   . Shortness of breath dyspnea    with bending over   . Wears glasses     Past Surgical History:  Procedure Laterality Date  . APPENDECTOMY    . CARPAL TUNNEL RELEASE  04/23/2012   Procedure: CARPAL TUNNEL RELEASE;  Surgeon: Cammie Sickle., MD;  Location: [facility];  Service: Orthopedics;  Laterality: Left;  . CARPAL TUNNEL RELEASE  06/27/2012   Procedure: CARPAL TUNNEL RELEASE;  Surgeon: Cammie Sickle., MD;  Location: [facility];  Service: Orthopedics;  Laterality: Right;  . CERVICAL DISCECTOMY  2007  . colonscopy     polyps removed / benign  . INCISIONAL HERNIA REPAIR  2011   subcostal from append  . JOINT REPLACEMENT  81,87   lt total hip  . LAPAROSCOPIC APPENDECTOMY  2010   required open repair  . TONSILLECTOMY    . TOTAL HIP ARTHROPLASTY     x3 left hip [phone]  . TOTAL HIP ARTHROPLASTY Right 04/05/2016   Procedure: RIGHT TOTAL HIP ARTHROPLASTY ANTERIOR APPROACH;  Surgeon: Gaynelle Arabian, MD;  Location: [facility];  Service: Orthopedics;  Laterality: Right;     reports that he  has never smoked. He has never used smokeless tobacco. He reports current alcohol use. He reports that he does not use drugs.  No Known Allergies  Family History  Problem Relation Age of Onset  . Heart disease Father   . Breast cancer Mother   . Breast cancer Sister   . Lung cancer Father   . Hypertension Father   . Hypertension Mother      Prior to Admission medications   Medication Sig Start Date End Date Taking? Authorizing Provider  cetirizine (ZYRTEC) 10 MG tablet Take 10 mg by mouth daily as needed for allergies.   Yes [provider]  levothyroxine (SYNTHROID) 75 MCG tablet Take 75 mcg by mouth daily before breakfast.   Yes [provider]  losartan-hydrochlorothiazide (HYZAAR) 50-12.5 MG per tablet Take 1 tablet by mouth daily.   Yes [provider]  lovastatin (MEVACOR) 40 MG tablet Take 40 mg by mouth every morning.   Yes [provider]  omeprazole (PRILOSEC) 40 MG capsule Take 40 mg by mouth daily.   Yes [provider]    Physical Exam: Vitals:   05/18/19 2100 05/18/19 2130 05/19/19 0001 05/19/19 0030  BP: 138/79 124/78 116/74 128/76  Pulse: 69 66 61 63  Resp: 12 (!) 23 (!) 31 (!) 26  Temp:      TempSrc:      SpO2: 92% 96% 93% 90%  Weight:          Constitutional: Acutely ill looking, no obvious distress Vitals:   05/18/19 2100 05/18/19 2130 05/19/19 0001 05/19/19 0030  BP: 138/79 124/78 116/74 128/76  Pulse: 69 66 61 63  Resp: 12 (!) 23 (!) 31 (!) 26  Temp:      TempSrc:      SpO2: 92% 96% 93% 90%  Weight:       Eyes: PERRL, lids and conjunctivae normal ENMT: Mucous membranes are dry. Posterior pharynx clear of any exudate or lesions.Normal dentition.  Neck: normal, supple, no masses, no thyromegaly Respiratory: Coarse breath sounds, mild crackles and rhonchi bilaterally.  Normal respiratory effort. No accessory muscle use.  Cardiovascular: Tachycardia, no murmurs / rubs / gallops. No extremity edema. 2+ pedal pulses. No carotid bruits.  Abdomen: no tenderness, no masses palpated. No hepatosplenomegaly. Bowel sounds positive.  Musculoskeletal: no clubbing / cyanosis. No joint deformity upper and lower extremities. Good ROM, no contractures. Normal muscle tone.  Skin: no rashes, lesions, ulcers. No induration Neurologic: CN 2-12 grossly intact. Sensation intact, DTR normal. Strength 5/5 in all 4.  Psychiatric: Normal judgment and insight. Alert and oriented x 3. Normal mood.     Labs on Admission: I have personally reviewed following labs and imaging studies  CBC: Recent Labs  Lab 05/18/19 2240  WBC 5.8  NEUTROABS 4.7  HGB 14.4  HCT 43.2  MCV 90.6   PLT 0000000   Basic Metabolic Panel: Recent Labs  Lab 05/18/19 2240  NA 133*  K 3.9  CL 97*  CO2 22  GLUCOSE 107*  BUN 16  CREATININE 0.92  CALCIUM 8.6*   GFR: CrCl cannot be calculated (Unknown ideal weight.). Liver Function Tests: Recent Labs  Lab 05/18/19 2240  AST 54*  ALT 31  ALKPHOS 49  BILITOT 0.6  PROT 7.6  ALBUMIN 3.3*   No results for input(s): LIPASE, AMYLASE in the last 168 hours. No results for input(s): AMMONIA in the last 168 hours. Coagulation Profile: No results for input(s): INR, PROTIME in the last 168 hours. Cardiac  Enzymes: No results for input(s): CKTOTAL, CKMB, CKMBINDEX, TROPONINI in the last 168 hours. BNP (last 3 results) No results for input(s): PROBNP in the last 8760 hours. HbA1C: No results for input(s): HGBA1C in the last 72 hours. CBG: No results for input(s): GLUCAP in the last 168 hours. Lipid Profile: Recent Labs    05/18/19 2240  TRIG 33   Thyroid Function Tests: No results for input(s): TSH, T4TOTAL, FREET4, T3FREE, THYROIDAB in the last 72 hours. Anemia Panel: Recent Labs    05/18/19 2240  FERRITIN 504*   Urine analysis:    Component Value Date/Time   COLORURINE YELLOW 04/05/2016 0544   APPEARANCEUR CLEAR 04/05/2016 0544   LABSPEC 1.016 04/05/2016 0544   PHURINE 5.5 04/05/2016 0544   GLUCOSEU NEGATIVE 04/05/2016 0544   HGBUR NEGATIVE 04/05/2016 0544   BILIRUBINUR NEGATIVE 04/05/2016 0544   KETONESUR NEGATIVE 04/05/2016 0544   PROTEINUR NEGATIVE 04/05/2016 0544   UROBILINOGEN 0.2 10/22/2009 1230   NITRITE NEGATIVE 04/05/2016 0544   LEUKOCYTESUR NEGATIVE 04/05/2016 0544   Sepsis Labs: @LABRCNTIP (procalcitonin:4,lacticidven:4) ) Recent Results (from the past 240 hour(s))  SARS Coronavirus 2 [facility] order, Performed in [facility] hospital lab) Nasopharyngeal Nasopharyngeal Swab     Status: Abnormal   Collection Time: 05/18/19 10:40 PM   Specimen: Nasopharyngeal Swab  Result Value Ref Range Status   SARS  Coronavirus 2 POSITIVE (A) NEGATIVE Final    Comment: RESULT CALLED TO, READ BACK BY AND VERIFIED WITH: FARRAR,S RN @0212  ON 05/19/2019 JACKSON,K (NOTE) If result is NEGATIVE SARS-CoV-2 target nucleic acids are NOT DETECTED. The SARS-CoV-2 RNA is generally detectable in upper and lower  respiratory specimens during the acute phase of infection. The lowest  concentration of SARS-CoV-2 viral copies this assay can detect is 250  copies / mL. A negative result does not preclude SARS-CoV-2 infection  and should not be used as the sole basis for treatment or other  patient management decisions.  A negative result may occur with  improper specimen collection / handling, submission of specimen other  than nasopharyngeal swab, presence of viral mutation(s) within the  areas targeted by this assay, and inadequate number of viral copies  (<250 copies / mL). A negative result must be combined with clinical  observations, patient history, and epidemiological information. If result is POSITIVE SARS-CoV-2 target nucleic acids are DETECTE D. The SARS-CoV-2 RNA is generally detectable in upper and lower  respiratory specimens during the acute phase of infection.  Positive  results are indicative of active infection with SARS-CoV-2.  Clinical  correlation with patient history and other diagnostic information is  necessary to determine patient infection status.  Positive results do  not rule out bacterial infection or co-infection with other viruses. If result is PRESUMPTIVE POSTIVE SARS-CoV-2 nucleic acids MAY BE PRESENT.   A presumptive positive result was obtained on the submitted specimen  and confirmed on repeat testing.  While 2019 novel coronavirus  (SARS-CoV-2) nucleic acids may be present in the submitted sample  additional confirmatory testing may be necessary for epidemiological  and / or clinical management purposes  to differentiate between  SARS-CoV-2 and other Sarbecovirus currently known  to infect humans.  If clinically indicated additional testing with an alternate test  methodology (LAB745 3) is advised. The SARS-CoV-2 RNA is generally  detectable in upper and lower respiratory specimens during the acute  phase of infection. The expected result is Negative. Fact Sheet for Patients:  StrictlyIdeas.no Fact Sheet for Healthcare Providers: BankingDealers.co.za This test is not yet approved  or cleared by the [place] and has been authorized for detection and/or diagnosis of SARS-CoV-2 by FDA under an Emergency Use Authorization (EUA).  This EUA will remain in effect (meaning this test can be used) for the duration of the COVID-19 declaration under Section 564(b)(1) of the Act, 21 U.S.C. section 360bbb-3(b)(1), unless the authorization is terminated or revoked sooner. Performed at [facility], [place] [address], [place], [place] [address]      Radiological Exams on Admission: Dg Chest Port 1 View  Result Date: 05/19/2019 CLINICAL DATA:  Initial evaluation for acute hypoxia, fatigue, recently diagnosed with COVID. EXAM: PORTABLE CHEST 1 VIEW COMPARISON:  Prior radiograph from 08/14/2012. FINDINGS: Cardiomegaly, stable. Mediastinal silhouette within normal limits. Aortic atherosclerosis. Lungs hypoinflated. Patchy and hazy bilateral airspace disease, which could reflect edema and/or infiltrates. No obvious pleural effusion. No pneumothorax. No acute osseous finding. Cervical ACDF noted. IMPRESSION: 1. Patchy and hazy bilateral airspace disease, which could reflect edema and/or infiltrates. 2. Cardiomegaly with aortic atherosclerosis. Electronically Signed   By: Jeannine Boga M.D.   On: 05/19/2019 00:06    EKG: Independently reviewed.  It shows sinus rhythm with a rate of 66, no significant ST changes  Assessment/Plan Principal Problem:   Pneumonia due to COVID-19 virus Active Problems:    Dyspnea   Hypertension   Hypothyroidism   GERD (gastroesophageal reflux disease)   Hyperlipidemia     #1 pneumonia due to COVID-19 virus: Patient is going to be admitted.  Initiate Remdesivir, Decadron, Lovenox and oxygen.  Decision on Actemra will be left to the primary team.  Also IV Rocephin and Zithromax.  Antitussives and IV fluids.  #2 acute hypoxia: Secondary to #1.  Continue treatment.  #3 hypertension: Blood pressures well controlled.  Continue home regimen.  #4 hypothyroidism: On levothyroxine.  Continue  #5 GERD: Continue PPI  #6 hyperlipidemia: Continue home statin   DVT prophylaxis: Lovenox Code Status: Full code Family Communication: Discussed care with the patient Disposition Plan: Home Consults called: None Admission status: Inpatient  Severity of Illness: The appropriate patient status for this patient is INPATIENT. Inpatient status is judged to be reasonable and necessary in order to provide the required intensity of service to ensure the patient's safety. The patient's presenting symptoms, physical exam findings, and initial radiographic and laboratory data in the context of their chronic comorbidities is felt to place them at high risk for further clinical deterioration. Furthermore, it is not anticipated that the patient will be medically stable for discharge from the hospital within 2 midnights of admission. The following factors support the patient status of inpatient.   " The patient's presenting symptoms include shortness of breath fever and cough. " The worrisome physical exam findings include coarse breath sounds bilaterally. " The initial radiographic and laboratory data are worrisome because of COVID-19 positive with other markers. " The chronic co-morbidities include hypertension.   * I certify that at the point of admission it is my clinical judgment that the patient will require inpatient hospital care spanning beyond 2 midnights from the point of  admission due to high intensity of service, high risk for further deterioration and high frequency of surveillance required.Barbette Merino MD [facility] Pager [phone]  If 7PM-7AM, please contact night-coverage [email or web address] Password [facility]  05/19/2019, 2:43 AM

## 2019-05-19 NOTE — Progress Notes (Signed)
[facility] PROGRESS NOTE    Progress Note  Colin [name]  [id number] DOB: 01/03/1951 DOA: 05/18/2019 PCP: Colin [name]     Brief Narrative:   Colin [name] is an 68 y.o. male past medical history significant for hypertension, GERD osteoarthritis history of skin cancer decreased hearing and depression who [facility] was feeling fine but has gradually had progressive weakness and shortness of breath.  He checked his oxygen saturation at home the day of admission and was in the 80s so he came into the ED.  Temperature on admission was 101.1, satting 96% on 4 L.  Assessment/Plan:  Acute respiratory failure with hypoxia due to  Pneumonia due to COVID-19 virus Patient is requiring 4 L of oxygen to keep saturation above 92%. He was started empirically on IV Remdesivir and steroids. He was started also empirically on IV Rocephin and azithromycin calcitonin was less than 0.1 will discontinue IV empiric antibiotics. Try to keep the patient preferably 16 hours a day but he cannot tolerated the 3 to 2 hours at a time  Hypovolemia hyponatremia: Start IV fluids.  Essential hypertension: Blood pressure is well controlled continue current home regimen.  Hypothyroidism: Continue Synthroid.  Hyperlipidemia: Continue statins.   DVT prophylaxis: lovenox Family Communication:none Disposition Plan/Barrier to D/C: once off oxygen Code Status:     Code Status Orders  (From admission, onward)         Start     Ordered   05/19/19 0312  Full code  Continuous     05/19/19 0311        Code Status History    Date Active Date Inactive Code Status Order ID Comments User Context   04/05/2016 1107 04/06/2016 1718 Full Code Colin [name]  Colin Arabian, [name] Inpatient   Advance Care Planning Activity        IV Access:    Peripheral IV   Procedures and diagnostic studies:   Dg Chest Port 1 View  Result Date: 05/19/2019 CLINICAL DATA:   Initial evaluation for acute hypoxia, fatigue, recently diagnosed with COVID. EXAM: PORTABLE CHEST 1 VIEW COMPARISON:  Prior radiograph from 08/14/2012. FINDINGS: Cardiomegaly, stable. Mediastinal silhouette within normal limits. Aortic atherosclerosis. Lungs hypoinflated. Patchy and hazy bilateral airspace disease, which could reflect edema and/or infiltrates. No obvious pleural effusion. No pneumothorax. No acute osseous finding. Cervical ACDF noted. IMPRESSION: 1. Patchy and hazy bilateral airspace disease, which could reflect edema and/or infiltrates. 2. Cardiomegaly with aortic atherosclerosis. Electronically Signed   By: Colin [name] M.D.   On: 05/19/2019 00:06     Medical Consultants:    None.  Anti-Infectives:   IV Remdesivir.  Subjective:    Colin [name] relates that he feels winded.  Specially with ambulation.  Objective:    Vitals:   05/19/19 0830 05/19/19 0900 05/19/19 0930 05/19/19 1000  BP: 130/71 130/85 107/62 125/85  Pulse: (!) 52 (!) 55 (!) 44 (!) 58  Resp: (!) 27 17 17 17   Temp:      TempSrc:      SpO2: 95% 95% 95% 97%  Weight:      Height:       SpO2: 97 % O2 Flow Rate (L/min): 4 L/min  No intake or output data in the 24 hours ending 05/19/19 1051 Filed Weights   05/18/19 2028 05/19/19 0612  Weight: 104.3 kg 113.4 kg    Exam: General exam: In no acute distress. Respiratory system: Good air movement and diffuse crackles  bilaterally. Cardiovascular system: S1 & S2 heard, RRR. No JVD. Gastrointestinal system: Abdomen is nondistended, soft and nontender.  Central nervous system: Alert and oriented. No focal neurological deficits. Extremities: No pedal edema. Skin: No rashes, lesions or ulcers Psychiatry: Judgement and insight appear normal. Mood & affect appropriate.    Data Reviewed:    Labs: Basic Metabolic Panel: Recent Labs  Lab 05/18/19 2240  NA 133*  K 3.9  CL 97*  CO2 22  GLUCOSE 107*  BUN 16  CREATININE 0.92  CALCIUM  8.6*   GFR Estimated Creatinine Clearance: 102.7 mL/min (by C-G formula based on SCr of 0.92 mg/dL). Liver Function Tests: Recent Labs  Lab 05/18/19 2240  AST 54*  ALT 31  ALKPHOS 49  BILITOT 0.6  PROT 7.6  ALBUMIN 3.3*   No results for input(s): LIPASE, AMYLASE in the last 168 hours. No results for input(s): AMMONIA in the last 168 hours. Coagulation profile No results for input(s): INR, PROTIME in the last 168 hours. COVID-19 Labs  Recent Labs    05/18/19 2240  DDIMER 1.97*  FERRITIN 504*  LDH 446*  CRP 10.2*    Lab Results  Component Value Date   SARSCOV2NAA POSITIVE (A) 05/18/2019    CBC: Recent Labs  Lab 05/18/19 2240  WBC 5.8  NEUTROABS 4.7  HGB 14.4  HCT 43.2  MCV 90.6  PLT 176   Cardiac Enzymes: No results for input(s): CKTOTAL, CKMB, CKMBINDEX, TROPONINI in the last 168 hours. BNP (last 3 results) No results for input(s): PROBNP in the last 8760 hours. CBG: No results for input(s): GLUCAP in the last 168 hours. D-Dimer: Recent Labs    05/18/19 2240  DDIMER 1.97*   Hgb A1c: No results for input(s): HGBA1C in the last 72 hours. Lipid Profile: Recent Labs    05/18/19 2240  TRIG 33   Thyroid function studies: No results for input(s): TSH, T4TOTAL, T3FREE, THYROIDAB in the last 72 hours.  Invalid input(s): FREET3 Anemia work up: Recent Labs    05/18/19 [place]*   Sepsis Labs: Recent Labs  Lab 05/18/19 [place] <0.10  WBC 5.8  LATICACIDVEN 1.2   Microbiology Recent Results (from the past 240 hour(s))  SARS Coronavirus 2 [facility] order, Performed in [facility] hospital lab) Nasopharyngeal Nasopharyngeal Swab     Status: Abnormal   Collection Time: 05/18/19 10:40 PM   Specimen: Nasopharyngeal Swab  Result Value Ref Range Status   SARS Coronavirus 2 POSITIVE (A) NEGATIVE Final    Comment: RESULT CALLED TO, READ BACK BY AND VERIFIED WITH: Colin [name] @0212  ON 05/19/2019 Colin [name] (NOTE) If result is  NEGATIVE SARS-CoV-2 target nucleic acids are NOT DETECTED. The SARS-CoV-2 RNA is generally detectable in upper and lower  respiratory specimens during the acute phase of infection. The lowest  concentration of SARS-CoV-2 viral copies this assay can detect is 250  copies / mL. A negative result does not preclude SARS-CoV-2 infection  and should not be used as the sole basis for treatment or other  patient management decisions.  A negative result may occur with  improper specimen collection / handling, submission of specimen other  than nasopharyngeal swab, presence of viral mutation(s) within the  areas targeted by this assay, and inadequate number of viral copies  (<250 copies / mL). A negative result must be combined with clinical  observations, patient history, and epidemiological information. If result is POSITIVE SARS-CoV-2 target nucleic acids are DETECTE D. The SARS-CoV-2 RNA is generally detectable in  upper and lower  respiratory specimens during the acute phase of infection.  Positive  results are indicative of active infection with SARS-CoV-2.  Clinical  correlation with patient history and other diagnostic information is  necessary to determine patient infection status.  Positive results do  not rule out bacterial infection or co-infection with other viruses. If result is PRESUMPTIVE POSTIVE SARS-CoV-2 nucleic acids MAY BE PRESENT.   A presumptive positive result was obtained on the submitted specimen  and confirmed on repeat testing.  While 2019 novel coronavirus  (SARS-CoV-2) nucleic acids may be present in the submitted sample  additional confirmatory testing may be necessary for epidemiological  and / or clinical management purposes  to differentiate between  SARS-CoV-2 and other Sarbecovirus currently known to infect humans.  If clinically indicated additional testing with an alternate test  methodology (LAB745 3) is advised. The SARS-CoV-2 RNA is generally  detectable  in upper and lower respiratory specimens during the acute  phase of infection. The expected result is Negative. Fact Sheet for Patients:  StrictlyIdeas.no Fact Sheet for Healthcare Providers: BankingDealers.co.za This test is not yet approved or cleared by the [place] FDA and has been authorized for detection and/or diagnosis of SARS-CoV-2 by FDA under an Emergency Use Authorization (EUA).  This EUA will remain in effect (meaning this test can be used) for the duration of the COVID-19 declaration under Section 564(b)(1) of the Act, 21 U.S.C. section 360bbb-3(b)(1), unless the authorization is terminated or revoked sooner. Performed at [facility], [place] [address], [place], [place] [address]   Blood Culture (routine x 2)     Status: None (Preliminary result)   Collection Time: 05/18/19 10:40 PM   Specimen: BLOOD  Result Value Ref Range Status   Specimen Description   Final    BLOOD RIGHT ANTECUBITAL Performed at [facility] [address], [place], [place] [address]    Special Requests   Final    BOTTLES DRAWN AEROBIC AND ANAEROBIC Blood Culture results may not be optimal due to an excessive volume of blood received in culture bottles Performed at [facility] [address], [place], [place] [address]    Culture   Final    NO GROWTH < 12 HOURS Performed at [facility] [address], [place], [place] [address]    Report Status PENDING  Incomplete  Blood Culture (routine x 2)     Status: None (Preliminary result)   Collection Time: 05/18/19 10:45 PM   Specimen: BLOOD  Result Value Ref Range Status   Specimen Description   Final    BLOOD LEFT ANTECUBITAL Performed at [facility] [address], [place], [place] [address]    Special Requests   Final    BOTTLES DRAWN AEROBIC AND ANAEROBIC Blood Culture results may not be optimal due to an  excessive volume of blood received in culture bottles Performed at [facility] [address] [place], [place] [address]    Culture   Final    NO GROWTH < 12 HOURS Performed at [facility] [address], [place], [place] [address]    Report Status PENDING  Incomplete     Medications:   . dexamethasone  6 mg Oral Q24H  . enoxaparin (LOVENOX) injection  40 mg Subcutaneous BID  . levothyroxine  75 mcg Oral QAC breakfast  . loratadine  10 mg Oral Daily  . pantoprazole  40 mg Oral Daily  .  pravastatin  40 mg Oral q1800   Continuous Infusions: . sodium chloride 75 mL/hr at 05/19/19 0506  . [START ON 05/20/2019] remdesivir 100 mg in NS 250 mL      LOS: 0 days   Charlynne Cousins  [facility]  05/19/2019, 10:51 AM

## 2019-05-19 NOTE — ED Notes (Signed)
[facility] contacted and paperwork printed  

## 2019-05-19 NOTE — ED Provider Notes (Signed)
[facility] DEPT Provider Note   CSN: [id number] Arrival date & time: 05/18/19  1929     History   Chief Complaint Chief Complaint  Patient presents with   Fatigue    COVID+    HPI Colin [name] is a 68 y.o. male with a hx of GERD, hyperlipidemia, hypertension, osteoarthritis presents to the Emergency Department complaining of gradual, persistent, progressively worsening COVID symptoms onset 1 week ago.  Patient reports [facility] and came back positive.  He reports since that time he has had mild headaches, fatigue, shortness of breath with coughing, myalgias, coughing and fevers.  He reports he has been checking his oxygen saturations at home and found them to be in the 80s today.  After discussing with his physician he was told he should come to the emergency department for evaluation.  No treatments prior to arrival.  Patient reports his wife is sick with the same.     The history is provided by the patient and medical records. No language interpreter was used.    Past Medical History:  Diagnosis Date   Arthritis    Bowel trouble    Stool leakage since colonscopy 02/2016   Cancer ([place])    skin cancer   Carpal tunnel syndrome, bilateral    Depression    in teenage years    GERD (gastroesophageal reflux disease)    Hyperlipemia    Hypertension    Hypothyroidism    Shortness of breath dyspnea    with bending over    Wears glasses     Patient Active Problem List   Diagnosis Date Noted   OA (osteoarthritis) of hip 04/05/2016   Dyspnea 04/19/2012   Preop cardiovascular exam 04/19/2012   Hypertension     Past Surgical History:  Procedure Laterality Date   APPENDECTOMY     CARPAL TUNNEL RELEASE  04/23/2012   Procedure: CARPAL TUNNEL RELEASE;  Surgeon: Cammie Sickle., MD;  Location: [facility];  Service: Orthopedics;  Laterality: Left;   CARPAL TUNNEL RELEASE  06/27/2012   Procedure: CARPAL TUNNEL RELEASE;  Surgeon: Cammie Sickle., MD;  Location: [facility];  Service: Orthopedics;  Laterality: Right;   CERVICAL DISCECTOMY  2007   colonscopy     polyps removed / benign   INCISIONAL HERNIA REPAIR  2011   subcostal from append   JOINT REPLACEMENT  81,87   lt total hip   LAPAROSCOPIC APPENDECTOMY  2010   required open repair   TONSILLECTOMY     TOTAL HIP ARTHROPLASTY     x3 left hip [phone]   TOTAL HIP ARTHROPLASTY Right 04/05/2016   Procedure: RIGHT TOTAL HIP ARTHROPLASTY ANTERIOR APPROACH;  Surgeon: Gaynelle Arabian, MD;  Location: [facility];  Service: Orthopedics;  Laterality: Right;        Home Medications    Prior to Admission medications   Medication Sig Start Date End Date Taking? Authorizing Provider  cetirizine (ZYRTEC) 10 MG tablet Take 10 mg by mouth daily as needed for allergies.   Yes [provider]  levothyroxine (SYNTHROID) 75 MCG tablet Take 75 mcg by mouth daily before breakfast.   Yes [provider]  losartan-hydrochlorothiazide (HYZAAR) 50-12.5 MG per tablet Take 1 tablet by mouth daily.   Yes [provider]  lovastatin (MEVACOR) 40 MG tablet Take 40 mg by mouth every morning.   Yes [provider]  omeprazole (PRILOSEC) 40 MG capsule Take  40 mg by mouth daily.   Yes [provider]    Family History Family History  Problem Relation Age of Onset   Heart disease Father    Breast cancer Mother    Breast cancer Sister    Lung cancer Father    Hypertension Father    Hypertension Mother     Social History Social History   Tobacco Use   Smoking status: Never Smoker   Smokeless tobacco: Never Used  Substance Use Topics   Alcohol use: Yes    Comment: ocassionally   Drug use: No     Allergies   Patient has no known allergies.   Review of Systems Review of Systems  Constitutional: Positive for fatigue and fever. Negative for appetite  change, diaphoresis and unexpected weight change.  HENT: Negative for mouth sores.   Eyes: Negative for visual disturbance.  Respiratory: Positive for cough and shortness of breath. Negative for chest tightness and wheezing.   Cardiovascular: Negative for chest pain.  Gastrointestinal: Negative for abdominal pain, constipation, diarrhea, nausea and vomiting.  Endocrine: Negative for polydipsia, polyphagia and polyuria.  Genitourinary: Negative for dysuria, frequency, hematuria and urgency.  Musculoskeletal: Positive for myalgias. Negative for back pain and neck stiffness.  Skin: Negative for rash.  Allergic/Immunologic: Negative for immunocompromised state.  Neurological: Negative for syncope, light-headedness and headaches.  Hematological: Does not bruise/bleed easily.  Psychiatric/Behavioral: Negative for sleep disturbance. The patient is not nervous/anxious.      Physical Exam Updated Vital Signs BP 116/74    Pulse 61    Temp (!) 101.1 F (38.4 C) (Oral)    Resp (!) 31    Wt 104.3 kg    SpO2 93%    BMI 30.34 kg/m   Physical Exam Vitals signs and nursing note reviewed.  Constitutional:      General: He is not in acute distress.    Appearance: He is not diaphoretic.  HENT:     Head: Normocephalic.  Eyes:     General: No scleral icterus.    Conjunctiva/sclera: Conjunctivae normal.  Neck:     Musculoskeletal: Normal range of motion.  Cardiovascular:     Rate and Rhythm: Normal rate and regular rhythm.     Pulses: Normal pulses.          Radial pulses are 2+ on the right side and 2+ on the left side.  Pulmonary:     Effort: Tachypnea present. No accessory muscle usage, prolonged expiration, respiratory distress or retractions.     Breath sounds: No stridor.     Comments: Equal chest rise. No increased work of breathing. Mild tachypnea Abdominal:     General: There is no distension.     Palpations: Abdomen is soft.     Tenderness: There is no abdominal tenderness. There  is no guarding or rebound.  Musculoskeletal:     Right lower leg: Edema present.     Left lower leg: Edema present.     Comments: Moves all extremities equally and without difficulty. Trace, nonpitting edema in the bilateral lower extremities.  No calf tenderness.  Skin:    General: Skin is warm and dry.     Capillary Refill: Capillary refill takes less than 2 seconds.  Neurological:     Mental Status: He is alert.     GCS: GCS eye subscore is 4. GCS verbal subscore is 5. GCS motor subscore is 6.     Comments: Speech is clear and goal oriented.  Psychiatric:  Mood and Affect: Mood normal.      ED Treatments / Results  Labs (all labs ordered are listed, but only abnormal results are displayed) Labs Reviewed  SARS CORONAVIRUS 2 (HOSPITAL ORDER, [facility]) - Abnormal; Notable for the following components:      Result Value   SARS Coronavirus 2 POSITIVE (*)    All other components within normal limits  CBC WITH DIFFERENTIAL/PLATELET - Abnormal; Notable for the following components:   Lymphs Abs 0.6 (*)    All other components within normal limits  COMPREHENSIVE METABOLIC PANEL - Abnormal; Notable for the following components:   Sodium 133 (*)    Chloride 97 (*)    Glucose, Bld 107 (*)    Calcium 8.6 (*)    Albumin 3.3 (*)    AST 54 (*)    All other components within normal limits  D-DIMER, QUANTITATIVE (NOT AT [facility]) - Abnormal; Notable for the following components:   D-Dimer, Quant 1.97 (*)    All other components within normal limits  LACTATE DEHYDROGENASE - Abnormal; Notable for the following components:   LDH 446 (*)    All other components within normal limits  FERRITIN - Abnormal; Notable for the following components:   Ferritin 504 (*)    All other components within normal limits  FIBRINOGEN - Abnormal; Notable for the following components:   Fibrinogen >800 (*)    All other components within normal limits  C-REACTIVE PROTEIN -  Abnormal; Notable for the following components:   CRP 10.2 (*)    All other components within normal limits  CULTURE, BLOOD (ROUTINE X 2)  CULTURE, BLOOD (ROUTINE X 2)  LACTIC ACID, PLASMA  PROCALCITONIN  TRIGLYCERIDES    EKG EKG Interpretation  Date/Time:  Sunday May 18 2019 21:15:40 EDT Ventricular Rate:  66 PR Interval:    QRS Duration: 109 QT Interval:  383 QTC Calculation: 402 R Axis:   35 Text Interpretation:  Sinus rhythm Abnormal R-wave progression, early transition Borderline T abnormalities, inferior leads since last tracing no significant change Confirmed by Daleen Bo [phone]) on 05/18/2019 11:21:40 PM   Radiology Dg Chest Port 1 View  Result Date: 05/19/2019 CLINICAL DATA:  Initial evaluation for acute hypoxia, fatigue, recently diagnosed with COVID. EXAM: PORTABLE CHEST 1 VIEW COMPARISON:  Prior radiograph from 08/14/2012. FINDINGS: Cardiomegaly, stable. Mediastinal silhouette within normal limits. Aortic atherosclerosis. Lungs hypoinflated. Patchy and hazy bilateral airspace disease, which could reflect edema and/or infiltrates. No obvious pleural effusion. No pneumothorax. No acute osseous finding. Cervical ACDF noted. IMPRESSION: 1. Patchy and hazy bilateral airspace disease, which could reflect edema and/or infiltrates. 2. Cardiomegaly with aortic atherosclerosis. Electronically Signed   By: Jeannine Boga M.D.   On: 05/19/2019 00:06    Procedures Procedures (including critical care time)  Medications Ordered in ED Medications  acetaminophen (TYLENOL) tablet 650 mg (650 mg Oral Given 05/19/19 0129)     Initial Impression / Assessment and Plan / ED Course  I have reviewed the triage vital signs and the nursing notes.  Pertinent labs & imaging results that were available during my care of the patient were reviewed by me and considered in my medical decision making (see chart for details).  Clinical Course as of May 18 228  Sun May 18, 2019  2208  febrile  Temp(!): 101.1 F (38.4 C) [HM]  2208 Pulse Rate: 66 [HM]  2209 Mild hypoxia on arrival  SpO2: 90 % [HM]  Mon May 19, 2019  0048 On 2.5L via [place]  SpO2: 90 % [HM]  0114 Bilateral, groundglass opacities  DG Chest Port 1 View [HM]  0229 Discussed with Dr. Jonelle Sidle who will admit   [HM]    Clinical Course User Index [HM] Paytyn Mesta, Devante, Pennella was evaluated in Emergency Department on 05/19/2019 for the symptoms described in the history of present illness. He was evaluated in the context of the global COVID-19 pandemic, which necessitated consideration that the patient might be at risk for infection with the SARS-CoV-2 virus that causes COVID-19. Institutional protocols and algorithms that pertain to the evaluation of patients at risk for COVID-19 are in a state of rapid change based on information released by regulatory bodies including the CDC and federal and state organizations. These policies and algorithms were followed during the patient's care in the ED.  Patient with outpatient diagnosis of COVID.  Worsening over the last week.  He presents febrile, tachypneic and hypoxic with an oxygen requirement.  Patient is a non-smoker and does not wear oxygen at home.  Other lab abnormalities are consistent with COVID.  Elevated d-dimer however patient is low risk for pulmonary embolism.  He will need to be admitted.  The patient was discussed with and seen by Dr. Betsey Holiday who agrees with the treatment plan.   Final Clinical Impressions(s) / ED Diagnoses   Final diagnoses:  COVID-19 virus infection  Hypoxia  Tachypnea    ED Discharge Orders    None       Loni Muse Gwenlyn Perking 05/19/19 0229    Orpah Greek, MD 05/19/19 [phone]

## 2019-05-20 DIAGNOSIS — E871 Hypo-osmolality and hyponatremia: Secondary | ICD-10-CM

## 2019-05-20 DIAGNOSIS — J9601 Acute respiratory failure with hypoxia: Secondary | ICD-10-CM

## 2019-05-20 LAB — CBC WITH DIFFERENTIAL/PLATELET
Abs Immature Granulocytes: 0.05 10*3/uL (ref 0.00–0.07)
Basophils Absolute: 0 10*3/uL (ref 0.0–0.1)
Basophils Relative: 0 %
Eosinophils Absolute: 0 10*3/uL (ref 0.0–0.5)
Eosinophils Relative: 0 %
HCT: 41 % (ref 39.0–52.0)
Hemoglobin: 13.9 g/dL (ref 13.0–17.0)
Immature Granulocytes: 1 %
Lymphocytes Relative: 11 %
Lymphs Abs: 0.7 10*3/uL (ref 0.7–4.0)
MCH: 30.6 pg (ref 26.0–34.0)
MCHC: 33.9 g/dL (ref 30.0–36.0)
MCV: 90.3 fL (ref 80.0–100.0)
Monocytes Absolute: 0.5 10*3/uL (ref 0.1–1.0)
Monocytes Relative: 7 %
Neutro Abs: 5.5 10*3/uL (ref 1.7–7.7)
Neutrophils Relative %: 81 %
Platelets: 204 10*3/uL (ref 150–400)
RBC: 4.54 MIL/uL (ref 4.22–5.81)
RDW: 13.4 % (ref 11.5–15.5)
WBC: 6.8 10*3/uL (ref 4.0–10.5)
nRBC: 0 % (ref 0.0–0.2)

## 2019-05-20 LAB — COMPREHENSIVE METABOLIC PANEL
ALT: 28 U/L (ref 0–44)
AST: 45 U/L — ABNORMAL HIGH (ref 15–41)
Albumin: 3 g/dL — ABNORMAL LOW (ref 3.5–5.0)
Alkaline Phosphatase: 45 U/L (ref 38–126)
Anion gap: 10 (ref 5–15)
BUN: 20 mg/dL (ref 8–23)
CO2: 23 mmol/L (ref 22–32)
Calcium: 8.6 mg/dL — ABNORMAL LOW (ref 8.9–10.3)
Chloride: 102 mmol/L (ref 98–111)
Creatinine, Ser: 0.78 mg/dL (ref 0.61–1.24)
GFR calc Af Amer: >60 mL/min (ref >60)
GFR calc non Af Amer: >60 mL/min (ref >60)
Glucose, Bld: 113 mg/dL — ABNORMAL HIGH (ref 70–99)
Potassium: 4.2 mmol/L (ref 3.5–5.1)
Sodium: 135 mmol/L (ref 135–145)
Total Bilirubin: 0.6 mg/dL (ref 0.3–1.2)
Total Protein: 7.3 g/dL (ref 6.5–8.1)

## 2019-05-20 LAB — C-REACTIVE PROTEIN: CRP: 7.3 mg/dL — ABNORMAL HIGH (ref <1.0)

## 2019-05-20 LAB — D-DIMER, QUANTITATIVE: D-Dimer, Quant: 1.4 ug/mL-FEU — ABNORMAL HIGH (ref 0.00–0.50)

## 2019-05-20 MED ORDER — TRAZODONE HCL 50 MG PO TABS
50.0000 mg | ORAL_TABLET | Freq: Every evening | ORAL | Status: DC | PRN
  Administered 2019-05-20 – 2019-05-26 (×7): 50 mg via ORAL
  Filled 2019-05-20 (×7): qty 1

## 2019-05-20 NOTE — Progress Notes (Signed)
[facility] PROGRESS NOTE    Progress Note  Colin [name]  [id number] DOB: 1951-07-08 DOA: 05/18/2019 PCP: Antony Contras, MD     Brief Narrative:   Colin [name] is an 68 y.o. male past medical history significant for hypertension, GERD osteoarthritis history of skin cancer decreased hearing and depression who [facility] was feeling fine but has gradually had progressive weakness and shortness of breath.  He checked his oxygen saturation at home the day of admission and was in the 80s so he came into the ED.  Temperature on admission was 101.1, satting 96% on 4 L.  Assessment/Plan:  Acute respiratory failure with hypoxia due to  Pneumonia due to COVID-19 virus Patient is requiring 3 L of oxygen to keep saturation above 95%. Continue IV steroids and IV Remdesivir. Try to keep the patient prone for at least 16 hours a day. Inflammatory markers are improving. Consult physical therapy.  Hypovolemia hyponatremia: Resolved with IV fluid hydration.  Essential hypertension: Blood pressure seems to be well controlled.  Hypothyroidism: Continue Synthroid.  Hyperlipidemia: Continue statins.   DVT prophylaxis: lovenox Family Communication:none Disposition Plan/Barrier to D/C: once off oxygen Code Status:     Code Status Orders  (From admission, onward)         Start     Ordered   05/19/19 0312  Full code  Continuous     05/19/19 0311        Code Status History    Date Active Date Inactive Code Status Order ID Comments User Context   04/05/2016 1107 04/06/2016 1718 Full Code [id number]  Gaynelle Arabian, MD Inpatient   Advance Care Planning Activity        IV Access:    Peripheral IV   Procedures and diagnostic studies:   Dg Chest Port 1 View  Result Date: 05/19/2019 CLINICAL DATA:  Initial evaluation for acute hypoxia, fatigue, recently diagnosed with COVID. EXAM: PORTABLE CHEST 1 VIEW COMPARISON:  Prior radiograph  from 08/14/2012. FINDINGS: Cardiomegaly, stable. Mediastinal silhouette within normal limits. Aortic atherosclerosis. Lungs hypoinflated. Patchy and hazy bilateral airspace disease, which could reflect edema and/or infiltrates. No obvious pleural effusion. No pneumothorax. No acute osseous finding. Cervical ACDF noted. IMPRESSION: 1. Patchy and hazy bilateral airspace disease, which could reflect edema and/or infiltrates. 2. Cardiomegaly with aortic atherosclerosis. Electronically Signed   By: Jeannine Boga M.D.   On: 05/19/2019 00:06     Medical Consultants:    None.  Anti-Infectives:   IV Remdesivir.  Subjective:    Colin [name] he relates his breathing is unchanged compared to yesterday.  Objective:    Vitals:   05/19/19 2008 05/20/19 0000 05/20/19 0500 05/20/19 0800  BP: (!) 151/81 122/61 127/63   Pulse: 60     Resp: (!) 22 (!) 22 20   Temp: 98.2 F (36.8 C) 98.3 F (36.8 C) 98.3 F (36.8 C) 98.2 F (36.8 C)  TempSrc:      SpO2: 92% 92% 95%   Weight:      Height:       SpO2: 95 % O2 Flow Rate (L/min): 3 L/min   Intake/Output Summary (Last 24 hours) at 05/20/2019 0813 Last data filed at 05/20/2019 0517 Gross per 24 hour  Intake 631.05 ml  Output 800 ml  Net -168.95 ml   Filed Weights   05/18/19 2028 05/19/19 0612 05/19/19 1141  Weight: 104.3 kg 113.4 kg 113.4 kg    Exam: General exam: In no  acute distress. Respiratory system: Good air movement and diffuse crackles bilaterally. Cardiovascular system: S1 & S2 heard, RRR. No JVD. Gastrointestinal system: Abdomen is nondistended, soft and nontender.  Central nervous system: Alert and oriented. No focal neurological deficits. Extremities: No pedal edema. Skin: No rashes, lesions or ulcers Psychiatry: Judgement and insight appear normal. Mood & affect appropriate.    Data Reviewed:    Labs: Basic Metabolic Panel: Recent Labs  Lab 05/18/19 2240  NA 133*  K 3.9  CL 97*  CO2 22  GLUCOSE 107*   BUN 16  CREATININE 0.92  CALCIUM 8.6*   GFR Estimated Creatinine Clearance: 102.7 mL/min (by C-G formula based on SCr of 0.92 mg/dL). Liver Function Tests: Recent Labs  Lab 05/18/19 2240  AST 54*  ALT 31  ALKPHOS 49  BILITOT 0.6  PROT 7.6  ALBUMIN 3.3*   No results for input(s): LIPASE, AMYLASE in the last 168 hours. No results for input(s): AMMONIA in the last 168 hours. Coagulation profile No results for input(s): INR, PROTIME in the last 168 hours. COVID-19 Labs  Recent Labs    05/18/19 2240 05/20/19 0510  DDIMER 1.97* 1.40*  FERRITIN 504*  --   LDH 446*  --   CRP 10.2* 7.3*    Lab Results  Component Value Date   SARSCOV2NAA POSITIVE (A) 05/18/2019    CBC: Recent Labs  Lab 05/18/19 2240 05/20/19 0510  WBC 5.8 6.8  NEUTROABS 4.7 5.5  HGB 14.4 13.9  HCT 43.2 41.0  MCV 90.6 90.3  PLT 176 204   Cardiac Enzymes: No results for input(s): CKTOTAL, CKMB, CKMBINDEX, TROPONINI in the last 168 hours. BNP (last 3 results) No results for input(s): PROBNP in the last 8760 hours. CBG: No results for input(s): GLUCAP in the last 168 hours. D-Dimer: Recent Labs    05/18/19 2240 05/20/19 0510  DDIMER 1.97* 1.40*   Hgb A1c: No results for input(s): HGBA1C in the last 72 hours. Lipid Profile: Recent Labs    05/18/19 2240  TRIG 33   Thyroid function studies: No results for input(s): TSH, T4TOTAL, T3FREE, THYROIDAB in the last 72 hours.  Invalid input(s): FREET3 Anemia work up: Recent Labs    05/18/19 [place]*   Sepsis Labs: Recent Labs  Lab 05/18/19 2240 05/20/19 0510  PROCALCITON <0.10  --   WBC 5.8 6.8  LATICACIDVEN 1.2  --    Microbiology Recent Results (from the past 240 hour(s))  SARS Coronavirus 2 [facility] order, Performed in [facility] hospital lab) Nasopharyngeal Nasopharyngeal Swab     Status: Abnormal   Collection Time: 05/18/19 10:40 PM   Specimen: Nasopharyngeal Swab  Result Value Ref Range Status   SARS  Coronavirus 2 POSITIVE (A) NEGATIVE Final    Comment: RESULT CALLED TO, READ BACK BY AND VERIFIED WITH: FARRAR,S RN @0212  ON 05/19/2019 JACKSON,K (NOTE) If result is NEGATIVE SARS-CoV-2 target nucleic acids are NOT DETECTED. The SARS-CoV-2 RNA is generally detectable in upper and lower  respiratory specimens during the acute phase of infection. The lowest  concentration of SARS-CoV-2 viral copies this assay can detect is 250  copies / mL. A negative result does not preclude SARS-CoV-2 infection  and should not be used as the sole basis for treatment or other  patient management decisions.  A negative result may occur with  improper specimen collection / handling, submission of specimen other  than nasopharyngeal swab, presence of viral mutation(s) within the  areas targeted by this assay, and inadequate number of  viral copies  (<250 copies / mL). A negative result must be combined with clinical  observations, patient history, and epidemiological information. If result is POSITIVE SARS-CoV-2 target nucleic acids are DETECTE D. The SARS-CoV-2 RNA is generally detectable in upper and lower  respiratory specimens during the acute phase of infection.  Positive  results are indicative of active infection with SARS-CoV-2.  Clinical  correlation with patient history and other diagnostic information is  necessary to determine patient infection status.  Positive results do  not rule out bacterial infection or co-infection with other viruses. If result is PRESUMPTIVE POSTIVE SARS-CoV-2 nucleic acids MAY BE PRESENT.   A presumptive positive result was obtained on the submitted specimen  and confirmed on repeat testing.  While 2019 novel coronavirus  (SARS-CoV-2) nucleic acids may be present in the submitted sample  additional confirmatory testing may be necessary for epidemiological  and / or clinical management purposes  to differentiate between  SARS-CoV-2 and other Sarbecovirus currently known  to infect humans.  If clinically indicated additional testing with an alternate test  methodology (LAB745 3) is advised. The SARS-CoV-2 RNA is generally  detectable in upper and lower respiratory specimens during the acute  phase of infection. The expected result is Negative. Fact Sheet for Patients:  StrictlyIdeas.no Fact Sheet for Healthcare Providers: BankingDealers.co.za This test is not yet approved or cleared by the [place] FDA and has been authorized for detection and/or diagnosis of SARS-CoV-2 by FDA under an Emergency Use Authorization (EUA).  This EUA will remain in effect (meaning this test can be used) for the duration of the COVID-19 declaration under Section 564(b)(1) of the Act, 21 U.S.C. section 360bbb-3(b)(1), unless the authorization is terminated or revoked sooner. Performed at [facility], [place] [address] [place], [place] [address]   Blood Culture (routine x 2)     Status: None (Preliminary result)   Collection Time: 05/18/19 10:40 PM   Specimen: BLOOD  Result Value Ref Range Status   Specimen Description   Final    BLOOD RIGHT ANTECUBITAL Performed at [facility] [address], [place], [place] [address]    Special Requests   Final    BOTTLES DRAWN AEROBIC AND ANAEROBIC Blood Culture results may not be optimal due to an excessive volume of blood received in culture bottles Performed at [facility] [address], [place], [place] [address]    Culture   Final    NO GROWTH 1 DAY Performed at [facility], [place] [address] [place], [place] [address]    Report Status PENDING  Incomplete  Blood Culture (routine x 2)     Status: None (Preliminary result)   Collection Time: 05/18/19 10:45 PM   Specimen: BLOOD  Result Value Ref Range Status   Specimen Description   Final    BLOOD LEFT ANTECUBITAL Performed at [facility] [address], [place], [place] [address]    Special Requests   Final    BOTTLES DRAWN AEROBIC AND ANAEROBIC Blood Culture results may not be optimal due to an excessive volume of blood received in culture bottles Performed at [facility] [address] [place], [place] [address]    Culture   Final    NO GROWTH 1 DAY Performed at [facility], [place] [address] [place], [place] [address]    Report Status PENDING  Incomplete     Medications:   . dexamethasone  6 mg  Oral Q24H  . enoxaparin (LOVENOX) injection  40 mg Subcutaneous BID  . levothyroxine  75 mcg Oral QAC breakfast  . loratadine  10 mg Oral Daily  . pantoprazole  40 mg Oral Daily  . pravastatin  40 mg Oral q1800   Continuous Infusions: . sodium chloride 75 mL/hr at 05/20/19 0600  . remdesivir 100 mg in NS 250 mL 100 mg (05/20/19 0517)    LOS: 1 day   Charlynne Cousins  [facility]  05/20/2019, 8:13 AM

## 2019-05-20 NOTE — Progress Notes (Signed)
Called and spoke with wife and updated status of pt. All questions answered.

## 2019-05-21 DIAGNOSIS — E039 Hypothyroidism, unspecified: Secondary | ICD-10-CM

## 2019-05-21 DIAGNOSIS — I10 Essential (primary) hypertension: Secondary | ICD-10-CM

## 2019-05-21 LAB — CBC WITH DIFFERENTIAL/PLATELET
Abs Immature Granulocytes: 0.06 10*3/uL (ref 0.00–0.07)
Basophils Absolute: 0 10*3/uL (ref 0.0–0.1)
Basophils Relative: 0 %
Eosinophils Absolute: 0 10*3/uL (ref 0.0–0.5)
Eosinophils Relative: 0 %
HCT: 39.3 % (ref 39.0–52.0)
Hemoglobin: 13 g/dL (ref 13.0–17.0)
Immature Granulocytes: 1 %
Lymphocytes Relative: 7 %
Lymphs Abs: 0.6 10*3/uL — ABNORMAL LOW (ref 0.7–4.0)
MCH: 30 pg (ref 26.0–34.0)
MCHC: 33.1 g/dL (ref 30.0–36.0)
MCV: 90.6 fL (ref 80.0–100.0)
Monocytes Absolute: 0.6 10*3/uL (ref 0.1–1.0)
Monocytes Relative: 7 %
Neutro Abs: 7.4 10*3/uL (ref 1.7–7.7)
Neutrophils Relative %: 85 %
Platelets: 175 10*3/uL (ref 150–400)
RBC: 4.34 MIL/uL (ref 4.22–5.81)
RDW: 13.5 % (ref 11.5–15.5)
WBC: 8.7 10*3/uL (ref 4.0–10.5)
nRBC: 0 % (ref 0.0–0.2)

## 2019-05-21 LAB — COMPREHENSIVE METABOLIC PANEL
ALT: 28 U/L (ref 0–44)
AST: 38 U/L (ref 15–41)
Albumin: 2.8 g/dL — ABNORMAL LOW (ref 3.5–5.0)
Alkaline Phosphatase: 51 U/L (ref 38–126)
Anion gap: 12 (ref 5–15)
BUN: 22 mg/dL (ref 8–23)
CO2: 19 mmol/L — ABNORMAL LOW (ref 22–32)
Calcium: 8.5 mg/dL — ABNORMAL LOW (ref 8.9–10.3)
Chloride: 109 mmol/L (ref 98–111)
Creatinine, Ser: 0.79 mg/dL (ref 0.61–1.24)
GFR calc Af Amer: >60 mL/min (ref >60)
GFR calc non Af Amer: >60 mL/min (ref >60)
Glucose, Bld: 105 mg/dL — ABNORMAL HIGH (ref 70–99)
Potassium: 4.4 mmol/L (ref 3.5–5.1)
Sodium: 140 mmol/L (ref 135–145)
Total Bilirubin: 0.5 mg/dL (ref 0.3–1.2)
Total Protein: 6.6 g/dL (ref 6.5–8.1)

## 2019-05-21 LAB — D-DIMER, QUANTITATIVE: D-Dimer, Quant: 1.15 ug/mL-FEU — ABNORMAL HIGH (ref 0.00–0.50)

## 2019-05-21 LAB — C-REACTIVE PROTEIN: CRP: 2.9 mg/dL — ABNORMAL HIGH (ref <1.0)

## 2019-05-21 MED ORDER — DEXAMETHASONE 6 MG PO TABS
6.0000 mg | ORAL_TABLET | Freq: Two times a day (BID) | ORAL | Status: DC
  Administered 2019-05-21 – 2019-05-27 (×12): 6 mg via ORAL
  Filled 2019-05-21 (×13): qty 1

## 2019-05-21 NOTE — Progress Notes (Signed)
PROGRESS NOTE  Colin [name]  [id number] DOB: 1951/07/28 DOA: 05/18/2019 PCP: Antony Contras, MD   Brief Narrative: Colin [name] is a 68 y.o. male with a history of HTN, GERD who presented to the ED on 8/30 after becoming short of breath with hypoxia on home oximetry in the setting of having increasing weakness and positive covid testing earlier in the week. On arrival he was febrile to 101.47F and hypoxic requiring 4L O2 with bilateral CXR infiltrates. He was admitted to [facility], started on steroids and remdesivir.    Assessment & Plan: Principal Problem:   Pneumonia due to COVID-19 virus Active Problems:   Dyspnea   Hypertension   Hypothyroidism   GERD (gastroesophageal reflux disease)   Hyperlipidemia   Acute respiratory failure with hypoxia (HCC)   Hyponatremia  Acute hypoxic respiratory failure:  - Continue remdesivir x5 days - Continue steroids, augment dosing given sluggish clinical response, planning 10 days - Monitor for secondary infection and alternative sources of hypoxia.  - IS encouraged, antitussives prn ordered.  - Pt unable to prone - D/w pt and wife the risks and benefits of non-approved therapies including hydroxychloroquine and IL6 inhibitors. At this time feel the risks are not outweighed by ptoential benefits which have not been borne out in the available RCTs at this time. Also discussed CCP which is a consideration given recent EUA by the FDA, though no conclusive clinical trial has proven efficacy at this time. We decided that if he deteriorates at all clinically, we would consider this in high titer form if available.   Insomnia, anxiety:  - Continue with trazodone, improved sleep.  Hypothyroidism:  - Continue synthroid  Hyperlipidemia:  - Continue statin  HTN:  - Continue current Tx  Hypovolemic hyponatremia: Resolved.  DVT prophylaxis: Lovenox Code Status: Full Family Communication: Wife by facetime at time of encounter today Disposition Plan:  Uncertain. PT, OT ordered  Consultants:   None  Procedures:   None  Antimicrobials:  Remdesivir   Subjective: Feels no better than admission, still short of breath at rest and exertion, has not gotten up past bedsdide chair since admission. Discouraged, anxious, but slept better last night with trazodone. Wife also has covid positive, cough, no dyspnea.   Objective: Vitals:   05/21/19 0400 05/21/19 0700 05/21/19 0740 05/21/19 0800  BP: 126/74  111/64   Pulse:    (!) 57  Resp: 20   18  Temp: 98.7 F (37.1 C)   98.9 F (37.2 C)  TempSrc: Oral     SpO2: 93% 96%  (!) 86%  Weight:      Height:        Intake/Output Summary (Last 24 hours) at 05/21/2019 0901 Last data filed at 05/21/2019 0800 Gross per 24 hour  Intake 986.51 ml  Output 950 ml  Net 36.51 ml   Filed Weights   05/18/19 2028 05/19/19 0612 05/19/19 1141  Weight: 104.3 kg 113.4 kg 113.4 kg    Gen: 68 y.o. male in no distress Pulm: Non-labored breathing HFNC, crackles at bilateral bases. CV: Regular rate and rhythm. No murmur, rub, or gallop. No JVD, no pedal edema. GI: Abdomen soft, non-tender, non-distended, with normoactive bowel sounds. No organomegaly or masses felt. Ext: Warm, no deformities Skin: No rashes, lesions or ulcers Neuro: Alert and oriented. No focal neurological deficits. Psych: Judgement and insight appear normal. Mood slightly anxious & affect appropriate.   Data Reviewed: I have personally reviewed following labs and imaging studies  CBC: Recent Labs  Lab 05/18/19 2240 05/20/19 0510 05/21/19 0547  WBC 5.8 6.8 8.7  NEUTROABS 4.7 5.5 7.4  HGB 14.4 13.9 13.0  HCT 43.2 41.0 39.3  MCV 90.6 90.3 90.6  PLT 176 204 0000000   Basic Metabolic Panel: Recent Labs  Lab 05/18/19 2240 05/20/19 0510 05/21/19 0547  NA 133* 135 140  K 3.9 4.2 4.4  CL 97* 102 109  CO2 22 23 19*  GLUCOSE 107* 113* 105*  BUN 16 20 22   CREATININE 0.92 0.78 0.79  CALCIUM 8.6* 8.6* 8.5*   GFR: Estimated  Creatinine Clearance: 118.1 mL/min (by C-G formula based on SCr of 0.79 mg/dL). Liver Function Tests: Recent Labs  Lab 05/18/19 2240 05/20/19 0510 05/21/19 0547  AST 54* 45* 38  ALT 31 28 28   ALKPHOS 49 45 51  BILITOT 0.6 0.6 0.5  PROT 7.6 7.3 6.6  ALBUMIN 3.3* 3.0* 2.8*   No results for input(s): LIPASE, AMYLASE in the last 168 hours. No results for input(s): AMMONIA in the last 168 hours. Coagulation Profile: No results for input(s): INR, PROTIME in the last 168 hours. Cardiac Enzymes: No results for input(s): CKTOTAL, CKMB, CKMBINDEX, TROPONINI in the last 168 hours. BNP (last 3 results) No results for input(s): PROBNP in the last 8760 hours. HbA1C: No results for input(s): HGBA1C in the last 72 hours. CBG: No results for input(s): GLUCAP in the last 168 hours. Lipid Profile: Recent Labs    05/18/19 2240  TRIG 33   Thyroid Function Tests: No results for input(s): TSH, T4TOTAL, FREET4, T3FREE, THYROIDAB in the last 72 hours. Anemia Panel: Recent Labs    05/18/19 2240  FERRITIN 504*   Urine analysis:    Component Value Date/Time   COLORURINE YELLOW 04/05/2016 [place] 04/05/2016 0544   LABSPEC 1.016 04/05/2016 0544   PHURINE 5.5 04/05/2016 0544   GLUCOSEU NEGATIVE 04/05/2016 0544   HGBUR NEGATIVE 04/05/2016 0544   BILIRUBINUR NEGATIVE 04/05/2016 0544   [place] 04/05/2016 0544   PROTEINUR NEGATIVE 04/05/2016 0544   UROBILINOGEN 0.2 10/22/2009 1230   NITRITE NEGATIVE 04/05/2016 0544   LEUKOCYTESUR NEGATIVE 04/05/2016 0544   Recent Results (from the past 240 hour(s))  SARS Coronavirus 2 [facility] order, Performed in [facility] hospital lab) Nasopharyngeal Nasopharyngeal Swab     Status: Abnormal   Collection Time: 05/18/19 10:40 PM   Specimen: Nasopharyngeal Swab  Result Value Ref Range Status   SARS Coronavirus 2 POSITIVE (A) NEGATIVE Final    Comment: RESULT CALLED TO, READ BACK BY AND VERIFIED WITH: FARRAR,S RN @0212  ON  05/19/2019 JACKSON,K (NOTE) If result is NEGATIVE SARS-CoV-2 target nucleic acids are NOT DETECTED. The SARS-CoV-2 RNA is generally detectable in upper and lower  respiratory specimens during the acute phase of infection. The lowest  concentration of SARS-CoV-2 viral copies this assay can detect is 250  copies / mL. A negative result does not preclude SARS-CoV-2 infection  and should not be used as the sole basis for treatment or other  patient management decisions.  A negative result may occur with  improper specimen collection / handling, submission of specimen other  than nasopharyngeal swab, presence of viral mutation(s) within the  areas targeted by this assay, and inadequate number of viral copies  (<250 copies / mL). A negative result must be combined with clinical  observations, patient history, and epidemiological information. If result is POSITIVE SARS-CoV-2 target nucleic acids are DETECTE D. The SARS-CoV-2 RNA is generally detectable in upper and lower  respiratory specimens  during the acute phase of infection.  Positive  results are indicative of active infection with SARS-CoV-2.  Clinical  correlation with patient history and other diagnostic information is  necessary to determine patient infection status.  Positive results do  not rule out bacterial infection or co-infection with other viruses. If result is PRESUMPTIVE POSTIVE SARS-CoV-2 nucleic acids MAY BE PRESENT.   A presumptive positive result was obtained on the submitted specimen  and confirmed on repeat testing.  While 2019 novel coronavirus  (SARS-CoV-2) nucleic acids may be present in the submitted sample  additional confirmatory testing may be necessary for epidemiological  and / or clinical management purposes  to differentiate between  SARS-CoV-2 and other Sarbecovirus currently known to infect humans.  If clinically indicated additional testing with an alternate test  methodology (LAB745 3) is advised.  The SARS-CoV-2 RNA is generally  detectable in upper and lower respiratory specimens during the acute  phase of infection. The expected result is Negative. Fact Sheet for Patients:  StrictlyIdeas.no Fact Sheet for Healthcare Providers: BankingDealers.co.za This test is not yet approved or cleared by the [place] FDA and has been authorized for detection and/or diagnosis of SARS-CoV-2 by FDA under an Emergency Use Authorization (EUA).  This EUA will remain in effect (meaning this test can be used) for the duration of the COVID-19 declaration under Section 564(b)(1) of the Act, 21 U.S.C. section 360bbb-3(b)(1), unless the authorization is terminated or revoked sooner. Performed at [facility], [place] [address] [place], [place] [address]   Blood Culture (routine x 2)     Status: None (Preliminary result)   Collection Time: 05/18/19 10:40 PM   Specimen: BLOOD  Result Value Ref Range Status   Specimen Description   Final    BLOOD RIGHT ANTECUBITAL Performed at [place] [address], [place], [place] [address]    Special Requests   Final    BOTTLES DRAWN AEROBIC AND ANAEROBIC Blood Culture results may not be optimal due to an excessive volume of blood received in culture bottles Performed at [place] [address] [place], [place] [address]    Culture   Final    NO GROWTH 1 DAY Performed at [facility], [place] [address], [place], [place] [address]    Report Status PENDING  Incomplete  Blood Culture (routine x 2)     Status: None (Preliminary result)   Collection Time: 05/18/19 10:45 PM   Specimen: BLOOD  Result Value Ref Range Status   Specimen Description   Final    BLOOD LEFT ANTECUBITAL Performed at [place] [address], [place], [place] [address]    Special Requests   Final    BOTTLES DRAWN AEROBIC AND ANAEROBIC Blood  Culture results may not be optimal due to an excessive volume of blood received in culture bottles Performed at [place] [address] [place], [place] [address]    Culture   Final    NO GROWTH 1 DAY Performed at [facility], [place] [address], [place], [place] [address]    Report Status PENDING  Incomplete      Radiology Studies: No results found.  Scheduled Meds: . dexamethasone  6 mg Oral Q24H  . enoxaparin (LOVENOX) injection  40 mg Subcutaneous BID  . levothyroxine  75 mcg Oral QAC breakfast  . loratadine  10 mg Oral Daily  . pantoprazole  40 mg Oral Daily  . pravastatin  40 mg Oral q1800   Continuous Infusions: . remdesivir 100 mg in NS 250 mL 100 mg (05/21/19 0513)     LOS: 2 days   Time spent: 35 minutes.  Patrecia Pour, MD [facility] [email or web address] Password [email or web address] 05/21/2019, 9:01 AM

## 2019-05-21 NOTE — Progress Notes (Signed)
Wife called back. Wife updated and all questions answered.

## 2019-05-21 NOTE — Evaluation (Signed)
Physical Therapy Evaluation Patient Details Name: Colin [name] MRN: [id number] DOB: 08-27-1951 Today's Date: 05/21/2019   History of Present Illness  68 y.o. male past medical history significant for hypertension, GERD osteoarthritis history of skin cancer decreased hearing and depression who tested positive for COVID-19 on [facility] last week was feeling fine but has gradually had progressive weakness and shortness of breath.  He checked his oxygen saturation at home 05/18/19 and was in the 80s so he came into the ED.  +pna, hypoxia  Clinical Impression   Pt admitted with above diagnosis. Patient required increased time problem-solving which type of sat monitor and which location provides most accurate numbers looking at his presentation. Using pediatric probe on his earlobe was reading significantly better (91% compared to 81% on finger). Patient does significantly drop his oxygen saturation with short distance ambulation and use of restroom. Anxiety may also be paying a part in his recovery. Pt currently with functional limitations due to the deficits listed below (see PT Problem List). Pt will benefit from skilled PT to increase their independence and safety with mobility to allow discharge to the venue listed below.       Follow Up Recommendations Home health PT    Equipment Recommendations  None recommended by PT    Recommendations for Other Services OT consult     Precautions / Restrictions Precautions Precautions: Other (comment) Precaution Comments: monitor sats      Mobility  Bed Mobility                  Transfers Overall transfer level: Needs assistance Equipment used: None Transfers: Sit to/from Stand Sit to Stand: Min guard         General transfer comment: for safety due to RR and decr sats  Ambulation/Gait Ambulation/Gait assistance: Min guard Gait Distance (Feet): 25 Feet(to toilet; had BM; 25 back to chair) Assistive device: None Gait  Pattern/deviations: Step-through pattern;Decreased stride length;Wide base of support Gait velocity: slow due to resp status   General Gait Details: steady with wider BOS due to decr velocity for pulmonary status  Stairs            Wheelchair Mobility    Modified Rankin (Stroke Patients Only)       Balance Overall balance assessment: No apparent balance deficits (not formally assessed)                                           Pertinent Vitals/Pain Pain Assessment: No/denies pain    Home Living Family/patient expects to be discharged to:: Private residence Living Arrangements: Spouse/significant other;Other (Comment)(friend staying for 2 weeks) Available Help at Discharge: Family;Available 24 hours/day(wife also sick; normally wife works during day) Type of Home: House Home Access: Stairs to enter Entrance Stairs-Rails: Psychiatric nurse of Steps: [place]: One level Home Equipment: Environmental consultant - 2 wheels;Cane - single point(walker may be in attic)      Prior Function Level of Independence: Independent with assistive device(s)         Comments: works from home; insurance; uses cane in community sometimes due to hip/back pain     Hand Dominance        Extremity/Trunk Assessment   Upper Extremity Assessment Upper Extremity Assessment: Generalized weakness    Lower Extremity Assessment Lower Extremity Assessment: Generalized weakness    Cervical / Trunk Assessment Cervical / Trunk Assessment:  Normal  Communication   Communication: No difficulties  Cognition Arousal/Alertness: Awake/alert Behavior During Therapy: Flat affect Overall Cognitive Status: Within Functional Limits for tasks assessed                                        General Comments General comments (skin integrity, edema, etc.): At rest on arrival pt saturating 89% on 3L and with talking dropping to 86%. Patient did not look as bad  as his pleth was reading, so placed new neonate probe on his earlobe (he'd had nelcor finger probe on finger) and he went from 81% finger to 91% ear! On 4L he got to 97% at rest and walked to bathroom with lowest 91%. He began straining for BM and dropped to 86% and educated not to strain/hold breath. When he stood to wipe, he dropped to 79% and had him sit to recover and incr to 6L to get him to 90% before we walked back to chair; seated in chair dropped to 81% and not talking, doing deep breaths on 6L he was at best 86% and wanting to eat. Increased him to 8L and 89-90% (earlobe) when he was starting to eat. I talked to him about trying proning again after lunch. We talked about putting his head at the foot of the bed and using the "foot up" control to put his back in more flexed/comfortable position (as opposed to when head is at [facility] and causes back extension).     Exercises Other Exercises Other Exercises: Educated in use of IS and pt return demonstrated x 2 breaths with strong 2-3 coughs provoked.    Assessment/Plan    PT Assessment Patient needs continued PT services  PT Problem List Decreased strength;Decreased activity tolerance;Decreased mobility;Decreased knowledge of use of DME;Decreased knowledge of precautions;Cardiopulmonary status limiting activity       PT Treatment Interventions DME instruction;Gait training;Functional mobility training;Therapeutic activities;Therapeutic exercise;Patient/family education    PT Goals (Current goals can be found in the Care Plan section)  Acute Rehab PT Goals Patient Stated Goal: feel stronger before going home PT Goal Formulation: With patient Time For Goal Achievement: 06/04/19 Potential to Achieve Goals: Good    Frequency Min 3X/week   Barriers to discharge Decreased caregiver support(wife currently sick, but at home)      Co-evaluation               AM-PAC PT "6 Clicks" Mobility  Outcome Measure Help needed turning from your  back to your side while in a flat bed without using bedrails?: None Help needed moving from lying on your back to sitting on the side of a flat bed without using bedrails?: None Help needed moving to and from a bed to a chair (including a wheelchair)?: A Little Help needed standing up from a chair using your arms (e.g., wheelchair or bedside chair)?: None Help needed to walk in hospital room?: A Little Help needed climbing 3-5 steps with a railing? : Total 6 Click Score: 19    End of Session Equipment Utilized During Treatment: Oxygen Activity Tolerance: Treatment limited secondary to medical complications (Comment)(decreasing sats) Patient left: in chair;with call bell/phone within reach Nurse Communication: Mobility status;Other (comment)(sats and O2 needs) PT Visit Diagnosis: Muscle weakness (generalized) (M62.81);Difficulty in walking, not elsewhere classified (R26.2)    Time: [id number] PT Time Calculation (min) (ACUTE ONLY): 78 min   Charges:   PT Evaluation $  PT Eval Moderate Complexity: 1 Mod PT Treatments $Gait Training: 8-22 mins $Self Care/Home Management: [address] PT      Rexanne Mano 05/21/2019, 1:38 PM

## 2019-05-21 NOTE — Progress Notes (Signed)
Called wife to update. No answer Message left to return call for updated.

## 2019-05-22 LAB — CBC WITH DIFFERENTIAL/PLATELET
Abs Immature Granulocytes: 0.1 10*3/uL — ABNORMAL HIGH (ref 0.00–0.07)
Basophils Absolute: 0 10*3/uL (ref 0.0–0.1)
Basophils Relative: 0 %
Eosinophils Absolute: 0 10*3/uL (ref 0.0–0.5)
Eosinophils Relative: 0 %
HCT: 38.9 % — ABNORMAL LOW (ref 39.0–52.0)
Hemoglobin: 12.7 g/dL — ABNORMAL LOW (ref 13.0–17.0)
Immature Granulocytes: 1 %
Lymphocytes Relative: 5 %
Lymphs Abs: 0.5 10*3/uL — ABNORMAL LOW (ref 0.7–4.0)
MCH: 29.7 pg (ref 26.0–34.0)
MCHC: 32.6 g/dL (ref 30.0–36.0)
MCV: 91.1 fL (ref 80.0–100.0)
Monocytes Absolute: 0.6 10*3/uL (ref 0.1–1.0)
Monocytes Relative: 7 %
Neutro Abs: 7.9 10*3/uL — ABNORMAL HIGH (ref 1.7–7.7)
Neutrophils Relative %: 87 %
Platelets: 244 10*3/uL (ref 150–400)
RBC: 4.27 MIL/uL (ref 4.22–5.81)
RDW: 13.6 % (ref 11.5–15.5)
WBC: 9.1 10*3/uL (ref 4.0–10.5)
nRBC: 0 % (ref 0.0–0.2)

## 2019-05-22 LAB — COMPREHENSIVE METABOLIC PANEL
ALT: 31 U/L (ref 0–44)
AST: 39 U/L (ref 15–41)
Albumin: 2.8 g/dL — ABNORMAL LOW (ref 3.5–5.0)
Alkaline Phosphatase: 48 U/L (ref 38–126)
Anion gap: 10 (ref 5–15)
BUN: 25 mg/dL — ABNORMAL HIGH (ref 8–23)
CO2: 22 mmol/L (ref 22–32)
Calcium: 8.7 mg/dL — ABNORMAL LOW (ref 8.9–10.3)
Chloride: 106 mmol/L (ref 98–111)
Creatinine, Ser: 0.84 mg/dL (ref 0.61–1.24)
GFR calc Af Amer: >60 mL/min (ref >60)
GFR calc non Af Amer: >60 mL/min (ref >60)
Glucose, Bld: 101 mg/dL — ABNORMAL HIGH (ref 70–99)
Potassium: 4.4 mmol/L (ref 3.5–5.1)
Sodium: 138 mmol/L (ref 135–145)
Total Bilirubin: 0.6 mg/dL (ref 0.3–1.2)
Total Protein: 6.5 g/dL (ref 6.5–8.1)

## 2019-05-22 LAB — C-REACTIVE PROTEIN: CRP: 2.5 mg/dL — ABNORMAL HIGH (ref <1.0)

## 2019-05-22 LAB — D-DIMER, QUANTITATIVE: D-Dimer, Quant: 1.04 ug/mL-FEU — ABNORMAL HIGH (ref 0.00–0.50)

## 2019-05-22 NOTE — Plan of Care (Signed)
Pt remained pain free and without fall or injury this shift. Ambulatory with sats dropping to the low 80s, but returning to the mid to upper 90s with minimal rest. Pt remains anxious about condition and reassurance offered. Able to sleep prone for several hours this shift. Tolerated well.

## 2019-05-22 NOTE — Progress Notes (Signed)
Occupational Therapy Evaluation Patient Details Name: Colin [name] MRN: [id number] DOB: 1950/10/18 Today's Date: 05/22/2019  Clinical Impression: PTA Pt independent in ADL and mobility, likes playing guitar and spending time with 3 daughters and their families. Pt today is min guard for transfers with no DME, able to complete standing grooming tasks at sink, Pt DOE3/4 and requires seated rest breaks. O2 monitor on earlobe reading SpO2 on 4L via HFNC at 86% or higher throughout session. Pt does require min A for LB ADL - Pt will benefit from skilled OT in the acute setting to maximize safety and independence in ADL as well as preventative deconditioning, energy conservation.      05/22/19 1000  OT Visit Information  Last OT Received On 05/22/19  Assistance Needed +1  History of Present Illness 68 y.o. male past medical history significant for hypertension, GERD osteoarthritis history of skin cancer decreased hearing and depression who tested positive for COVID-19 on [facility] last week was feeling fine but has gradually had progressive weakness and shortness of breath.  He checked his oxygen saturation at home 05/18/19 and was in the 80s so he came into the ED.  +pna, hypoxia  Precautions  Precautions Other (comment)  Precaution Comments monitor sats  Restrictions  Weight Bearing Restrictions No  Home Living  Family/patient expects to be discharged to: Private residence  Living Arrangements Spouse/significant other;Other (Comment) (friend staying for 2 weeks)  Available Help at Discharge Family;Available 24 hours/day (wife also sick; normally wife works during day)  Type of Barrister's clerk to enter  [employer] of [place] One level  Bathroom Shower/Tub Walk-in Cytogeneticist Yes  How Accessible Accessible via [place] - 2 wheels;Cane - single point (walker  may be in attic)  Prior Function  Level of Independence Independent with assistive device(s)  Comments works from home; insurance; uses cane in community sometimes due to hip/back pain  Communication  Communication No difficulties  Pain Assessment  Pain Assessment No/denies pain  Cognition  Arousal/Alertness Awake/alert  Behavior During Therapy Flat affect;Anxious  Overall Cognitive Status Within Functional Limits for tasks assessed  Upper Extremity Assessment  Upper Extremity Assessment Generalized weakness  Lower Extremity Assessment  Lower Extremity Assessment Defer to PT evaluation  Cervical / Trunk Assessment  Cervical / Trunk Assessment Normal  ADL  Overall ADL's  Needs assistance/impaired  Eating/Feeding Independent  Grooming Min guard;Wash/dry hands;Oral care;Wash/dry face;Standing  Upper Body Bathing Supervision/ safety;Sitting  Lower Body Bathing Min guard;Sitting/lateral leans  Lower Body Bathing Details (indicate cue type and reason) educated in use of 3 in 1, energy conservation  Upper Body Dressing  Set up;Sitting  Lower Body Dressing Min guard;Sit to/from Environmental education officer guard;Ambulation  Toilet Transfer Details (indicate cue type and reason) into bathroom  Toileting- Clothing Manipulation and Hygiene Supervision/safety;Sit to/from stand  Toileting - Clothing Manipulation Details (indicate cue type and reason) use of grab bar for stability  Functional mobility during ADLs Min guard  General ADL Comments has to be cued for breaks with DOE 2-3/4  Vision- History  Baseline Vision/History Wears glasses  Wears Glasses At all times  Patient Visual Report No change from baseline  Bed Mobility  General bed mobility comments OOB in recliner at beginning and end of session  Transfers  Overall transfer level Needs assistance  Equipment used None  Transfers Sit to/from Stand  Sit to  Stand Min guard  Balance  Overall balance assessment No apparent balance  deficits (not formally assessed)  Exercises  Exercises Other exercises  Other Exercises  Other Exercises re-enforced use of IS return demonstrated x 3 breaths with strong 2-3 coughs provoked.   OT - End of Session  Equipment Utilized During Treatment Oxygen (4L)  Activity Tolerance Patient tolerated treatment well  Patient left in chair;with call bell/phone within reach  Nurse Communication Mobility status  OT Assessment  OT Recommendation/Assessment Patient needs continued OT Services  OT Visit Diagnosis Other abnormalities of gait and mobility (R26.89);Muscle weakness (generalized) (M62.81)  OT Problem List Decreased activity tolerance;Impaired balance (sitting and/or standing);Cardiopulmonary status limiting activity;Decreased knowledge of use of DME or AE  OT Plan  OT Frequency (ACUTE ONLY) Min 2X/week  OT Treatment/Interventions (ACUTE ONLY) Self-care/ADL training;Therapeutic exercise;Energy conservation;DME and/or AE instruction;Therapeutic activities;Patient/family education;Balance training  AM-PAC OT "6 Clicks" Daily Activity Outcome Measure (Version 2)  Help from another person eating meals? 4  Help from another person taking care of personal grooming? 3  Help from another person toileting, which includes using toliet, bedpan, or urinal? 3  Help from another person bathing (including washing, rinsing, drying)? 3  Help from another person to put on and taking off regular upper body clothing? 4  Help from another person to put on and taking off regular lower body clothing? 3  6 Click Score 20  OT Recommendation  Recommendations for Other Services PT consult  Follow Up Recommendations No OT follow up;Supervision - Intermittent  OT Equipment 3 in 1 bedside commode  Individuals Consulted  Consulted and Agree with Results and Recommendations Patient  Acute Rehab OT Goals  Patient Stated Goal feel stronger before going home  OT Goal Formulation With patient  Time For Goal  Achievement 05/22/19  Potential to Achieve Goals Good  OT Time Calculation  OT Start Time (ACUTE ONLY) 1447  OT Stop Time (ACUTE ONLY) 1547  OT Time Calculation (min) 60 min  OT General Charges  $OT Visit 1 Visit  OT Evaluation  $OT Eval Moderate Complexity 1 Mod  OT Treatments  $Self Care/Home Management  23-37 mins  $Therapeutic Activity 8-22 mins  Written Expression  Dominant Hand Right   Hulda Humphrey OTR/L Acute Rehabilitation Services Pager: [phone] Office: [phone]

## 2019-05-22 NOTE — Progress Notes (Addendum)
PROGRESS NOTE  Colin [name]  [id number] DOB: 1950-09-19 DOA: 05/18/2019 PCP: Antony Contras, MD   Brief Narrative: Colin [name] is a 68 y.o. male with a history of HTN, GERD who presented to the ED on 8/30 after becoming short of breath with hypoxia on home oximetry in the setting of having increasing weakness and positive covid testing earlier in the week. On arrival he was febrile to 101.43F and hypoxic requiring 4L O2 with bilateral CXR infiltrates. He was admitted to [facility], started on steroids and remdesivir.    Assessment & Plan: Principal Problem:   Pneumonia due to COVID-19 virus Active Problems:   Dyspnea   Hypertension   Hypothyroidism   GERD (gastroesophageal reflux disease)   Hyperlipidemia   Acute respiratory failure with hypoxia (HCC)   Hyponatremia  Acute hypoxic respiratory failure:  - Continue remdesivir x5 days, final dose 9/4.  - Continue steroids, augment dosing given sluggish clinical response, planning 10 days - Monitor for secondary infection and alternative sources of hypoxia.  - Trend inflammatory markers, showing sustained improvement, down another ~20% today. - IS encouraged, antitussives prn ordered.  - D/w pt and wife on 9/2 the risks and benefits of non-approved therapies including hydroxychloroquine and IL6 inhibitors. At this time feel the risks are not outweighed by ptoential benefits which have not been borne out in the available RCTs at this time. Also discussed CCP which is a consideration given recent EUA by the FDA, though no conclusive clinical trial has proven efficacy at this time. We decided that if he deteriorates at all clinically, we would consider this in high titer form if available. - Emphasized OOB, increasing mobility as tolerated.   Insomnia, anxiety:  - Continue with trazodone  - Can give prn if this is adversely impacting subjective dyspnea, though amount of dyspnea appears to be congruent with hypoxia.   Hypothyroidism:  -  Continue synthroid - Due to anxiety and sinus bradycardia, will check TSH.  Hyperlipidemia:  - Continue statin  HTN:  - Continue current Tx  Hypovolemic hyponatremia: Resolved.   DVT prophylaxis: Lovenox Code Status: Full Family Communication: Wife by facetime at time of encounter today Disposition Plan: Uncertain. PT currently recommending return home with home health at time of discharge. Will need improvement in hypoxia.   Consultants:   None  Procedures:   None  Antimicrobials:  Remdesivir 8/31 - 9/4  Subjective: Slightly less short of breath, most noticed on exertion. Got up to bathroom yesterday, felt better than previously. Still moderately short of breath which is no where near his baseline without any dyspnea. No chest pain or fever. Only tolerated proning for a couple hours last night, slept well. Wife continues to feel well.  Objective: Vitals:   05/21/19 1940 05/22/19 0404 05/22/19 0751 05/22/19 0753  BP: 111/77 (!) 116/50 98/60   Pulse:  (!) 53 (!) 56 69  Resp:  17 (!) 21 18  Temp: (!) 97.5 F (36.4 C) 98.3 F (36.8 C) 98.3 F (36.8 C)   TempSrc: Oral Oral Oral   SpO2:  100% 95% 93%  Weight:      Height:        Intake/Output Summary (Last 24 hours) at 05/22/2019 0929 Last data filed at 05/21/2019 2000 Gross per 24 hour  Intake 600 ml  Output 550 ml  Net 50 ml   Filed Weights   05/18/19 2028 05/19/19 0612 05/19/19 1141  Weight: 104.3 kg 113.4 kg 113.4 kg   Gen: 68 y.o. male  in no distress Pulm: Nonlabored but tachypneic, slightly labored with longer sentences. Crackles at both bases. CV: Regular rate and rhythm. No murmur, rub, or gallop. No JVD, no dependent edema. GI: Abdomen soft, non-tender, non-distended, with normoactive bowel sounds.  Ext: Warm, no deformities Skin: No rashes, lesions or ulcers on visualized skin. Neuro: Alert and oriented. No focal neurological deficits. Psych: Judgement and insight appear fair. Mood euthymic & affect  congruent. Behavior is appropriate.    Telemetry personally reviewed today: Showing sinus bradycardia and normal sinus rhythm predominantly. No pauses. There are 2 isolated PVCs with different morphologies.  Data Reviewed: I have personally reviewed following labs and imaging studies  CBC: Recent Labs  Lab 05/18/19 2240 05/20/19 0510 05/21/19 0547 05/22/19 0245  WBC 5.8 6.8 8.7 9.1  NEUTROABS 4.7 5.5 7.4 7.9*  HGB 14.4 13.9 13.0 12.7*  HCT 43.2 41.0 39.3 38.9*  MCV 90.6 90.3 90.6 91.1  PLT 176 204 175 [id number]   Basic Metabolic Panel: Recent Labs  Lab 05/18/19 2240 05/20/19 0510 05/21/19 0547 05/22/19 0245  NA 133* 135 140 138  K 3.9 4.2 4.4 4.4  CL 97* 102 109 106  CO2 22 23 19* 22  GLUCOSE 107* 113* 105* 101*  BUN 16 20 22  25*  CREATININE 0.92 0.78 0.79 0.84  CALCIUM 8.6* 8.6* 8.5* 8.7*   GFR: Estimated Creatinine Clearance: 112.4 mL/min (by C-G formula based on SCr of 0.84 mg/dL). Liver Function Tests: Recent Labs  Lab 05/18/19 2240 05/20/19 0510 05/21/19 0547 05/22/19 0245  AST 54* 45* 38 39  ALT 31 28 28 31   ALKPHOS 49 45 51 48  BILITOT 0.6 0.6 0.5 0.6  PROT 7.6 7.3 6.6 6.5  ALBUMIN 3.3* 3.0* 2.8* 2.8*   Recent Results (from the past 240 hour(s))  SARS Coronavirus 2 [facility] order, Performed in [facility] hospital lab) Nasopharyngeal Nasopharyngeal Swab     Status: Abnormal   Collection Time: 05/18/19 10:40 PM   Specimen: Nasopharyngeal Swab  Result Value Ref Range Status   SARS Coronavirus 2 POSITIVE (A) NEGATIVE Final    Comment: RESULT CALLED TO, READ BACK BY AND VERIFIED WITH: FARRAR,S RN @0212  ON 05/19/2019 JACKSON,K (NOTE) If result is NEGATIVE SARS-CoV-2 target nucleic acids are NOT DETECTED. The SARS-CoV-2 RNA is generally detectable in upper and lower  respiratory specimens during the acute phase of infection. The lowest  concentration of SARS-CoV-2 viral copies this assay can detect is 250  copies / mL. A negative result does not preclude  SARS-CoV-2 infection  and should not be used as the sole basis for treatment or other  patient management decisions.  A negative result may occur with  improper specimen collection / handling, submission of specimen other  than nasopharyngeal swab, presence of viral mutation(s) within the  areas targeted by this assay, and inadequate number of viral copies  (<250 copies / mL). A negative result must be combined with clinical  observations, patient history, and epidemiological information. If result is POSITIVE SARS-CoV-2 target nucleic acids are DETECTE D. The SARS-CoV-2 RNA is generally detectable in upper and lower  respiratory specimens during the acute phase of infection.  Positive  results are indicative of active infection with SARS-CoV-2.  Clinical  correlation with patient history and other diagnostic information is  necessary to determine patient infection status.  Positive results do  not rule out bacterial infection or co-infection with other viruses. If result is PRESUMPTIVE POSTIVE SARS-CoV-2 nucleic acids MAY BE PRESENT.   A presumptive positive result  was obtained on the submitted specimen  and confirmed on repeat testing.  While 2019 novel coronavirus  (SARS-CoV-2) nucleic acids may be present in the submitted sample  additional confirmatory testing may be necessary for epidemiological  and / or clinical management purposes  to differentiate between  SARS-CoV-2 and other Sarbecovirus currently known to infect humans.  If clinically indicated additional testing with an alternate test  methodology (LAB745 3) is advised. The SARS-CoV-2 RNA is generally  detectable in upper and lower respiratory specimens during the acute  phase of infection. The expected result is Negative. Fact Sheet for Patients:  StrictlyIdeas.no Fact Sheet for Healthcare Providers: BankingDealers.co.za This test is not yet approved or cleared by the  [place] FDA and has been authorized for detection and/or diagnosis of SARS-CoV-2 by FDA under an Emergency Use Authorization (EUA).  This EUA will remain in effect (meaning this test can be used) for the duration of the COVID-19 declaration under Section 564(b)(1) of the Act, 21 U.S.C. section 360bbb-3(b)(1), unless the authorization is terminated or revoked sooner. Performed at [facility], [place] [address], [place], [place] [address]   Blood Culture (routine x 2)     Status: None (Preliminary result)   Collection Time: 05/18/19 10:40 PM   Specimen: BLOOD  Result Value Ref Range Status   Specimen Description   Final    BLOOD RIGHT ANTECUBITAL Performed at [facility] [address], [place], [place] [address]    Special Requests   Final    BOTTLES DRAWN AEROBIC AND ANAEROBIC Blood Culture results may not be optimal due to an excessive volume of blood received in culture bottles Performed at [facility] [address] [place], [place] [address]    Culture   Final    NO GROWTH 2 DAYS Performed at [facility] [address] [place], [place] [address]    Report Status PENDING  Incomplete  Blood Culture (routine x 2)     Status: None (Preliminary result)   Collection Time: 05/18/19 10:45 PM   Specimen: BLOOD  Result Value Ref Range Status   Specimen Description   Final    BLOOD LEFT ANTECUBITAL Performed at [facility] [address], [place], [place] [address]    Special Requests   Final    BOTTLES DRAWN AEROBIC AND ANAEROBIC Blood Culture results may not be optimal due to an excessive volume of blood received in culture bottles Performed at [facility] [address], [place], [place] [address]    Culture   Final    NO GROWTH 2 DAYS Performed at [facility] [address], [place], [place] [address]    Report Status PENDING  Incomplete       Radiology Studies: No results found.  Scheduled Meds: . dexamethasone  6 mg Oral Q12H  . enoxaparin (LOVENOX) injection  40 mg Subcutaneous BID  . levothyroxine  75 mcg Oral QAC breakfast  . loratadine  10 mg Oral Daily  . pantoprazole  40 mg Oral Daily  . pravastatin  40 mg Oral q1800   Continuous Infusions: . remdesivir 100 mg in NS 250 mL 100 mg (05/22/19 0904)     LOS: 3 days   Time spent: 35 minutes.  Patrecia Pour, MD [facility] [email or web address] Password [email or web address] 05/22/2019, 9:29 AM

## 2019-05-23 LAB — CBC WITH DIFFERENTIAL/PLATELET
Abs Immature Granulocytes: 0.22 10*3/uL — ABNORMAL HIGH (ref 0.00–0.07)
Basophils Absolute: 0 10*3/uL (ref 0.0–0.1)
Basophils Relative: 0 %
Eosinophils Absolute: 0 10*3/uL (ref 0.0–0.5)
Eosinophils Relative: 0 %
HCT: 39 % (ref 39.0–52.0)
Hemoglobin: 13 g/dL (ref 13.0–17.0)
Immature Granulocytes: 2 %
Lymphocytes Relative: 7 %
Lymphs Abs: 0.7 10*3/uL (ref 0.7–4.0)
MCH: 30.1 pg (ref 26.0–34.0)
MCHC: 33.3 g/dL (ref 30.0–36.0)
MCV: 90.3 fL (ref 80.0–100.0)
Monocytes Absolute: 0.6 10*3/uL (ref 0.1–1.0)
Monocytes Relative: 7 %
Neutro Abs: 7.6 10*3/uL (ref 1.7–7.7)
Neutrophils Relative %: 84 %
Platelets: 261 10*3/uL (ref 150–400)
RBC: 4.32 MIL/uL (ref 4.22–5.81)
RDW: 13.4 % (ref 11.5–15.5)
WBC: 9.1 10*3/uL (ref 4.0–10.5)
nRBC: 0 % (ref 0.0–0.2)

## 2019-05-23 LAB — COMPREHENSIVE METABOLIC PANEL
ALT: 34 U/L (ref 0–44)
AST: 36 U/L (ref 15–41)
Albumin: 2.8 g/dL — ABNORMAL LOW (ref 3.5–5.0)
Alkaline Phosphatase: 49 U/L (ref 38–126)
Anion gap: 8 (ref 5–15)
BUN: 26 mg/dL — ABNORMAL HIGH (ref 8–23)
CO2: 23 mmol/L (ref 22–32)
Calcium: 8.9 mg/dL (ref 8.9–10.3)
Chloride: 107 mmol/L (ref 98–111)
Creatinine, Ser: 0.86 mg/dL (ref 0.61–1.24)
GFR calc Af Amer: >60 mL/min (ref >60)
GFR calc non Af Amer: >60 mL/min (ref >60)
Glucose, Bld: 119 mg/dL — ABNORMAL HIGH (ref 70–99)
Potassium: 4.5 mmol/L (ref 3.5–5.1)
Sodium: 138 mmol/L (ref 135–145)
Total Bilirubin: 0.8 mg/dL (ref 0.3–1.2)
Total Protein: 6.9 g/dL (ref 6.5–8.1)

## 2019-05-23 LAB — C-REACTIVE PROTEIN: CRP: 2.8 mg/dL — ABNORMAL HIGH (ref <1.0)

## 2019-05-23 LAB — D-DIMER, QUANTITATIVE: D-Dimer, Quant: 0.89 ug/mL-FEU — ABNORMAL HIGH (ref 0.00–0.50)

## 2019-05-23 LAB — TSH: TSH: 0.353 u[IU]/mL (ref 0.350–4.500)

## 2019-05-23 NOTE — Progress Notes (Signed)
PROGRESS NOTE  Colin [name]  [id number] DOB: 1950/10/17 DOA: 05/18/2019 PCP: Antony Contras, MD   Brief Narrative: Colin [name] is a 68 y.o. male with a history of HTN, GERD who presented to the ED on 8/30 after becoming short of breath with hypoxia on home oximetry in the setting of having increasing weakness and positive covid testing earlier in the week. On arrival he was febrile to 101.81F and hypoxic requiring 4L O2 with bilateral CXR infiltrates. He was admitted to [facility], started on steroids and remdesivir. Clinical progress has been slow, though he has not shown evidence of decompensation and inflammatory markers are improving.  Assessment & Plan: Principal Problem:   Pneumonia due to COVID-19 virus Active Problems:   Dyspnea   Hypertension   Hypothyroidism   GERD (gastroesophageal reflux disease)   Hyperlipidemia   Acute respiratory failure with hypoxia (HCC)   Hyponatremia  Acute hypoxic respiratory failure:  - Completed remdesivir x5 days on 9/4.  - Continue steroids, increased to BID dosing 9/3. Will continue x10 days (8/30 - 9/8) - Monitor for secondary infection and alternative sources of hypoxia. D-dimer declining and no change to indicate bacterial superinfection at this time. - Trend inflammatory markers. CRP bumped slightly from downward trajectory. This was discouraging to the patient, though with declining d-dimer and stable clinical picture, not clear if this is clinically significant. - IS encouraged, antitussives prn ordered.  - D/w pt and wife on 9/2 the risks and benefits of non-approved therapies including hydroxychloroquine and IL6 inhibitors. At this time feel the risks are not outweighed by ptoential benefits which have not been borne out in the available RCTs at this time. Also discussed CCP which is a consideration given recent EUA by the FDA, though no conclusive clinical trial has proven efficacy at this time. We decided that if he deteriorates at all  clinically, we would consider this in high titer form if available. - Emphasized OOB, increasing mobility as tolerated.   Insomnia, anxiety:  - Continue with trazodone  - Can give prn if this is adversely impacting subjective dyspnea.  Hypothyroidism: TSH 0.353. - Continue synthroid   Hyperlipidemia:  - Continue statin  HTN:  - Continue current Tx  Hypovolemic hyponatremia: Resolved.   DVT prophylaxis: Lovenox Code Status: Full Family Communication: Will d/w wife by phone later today. Disposition Plan: Anticipate DC home w/HH pending clinical improvement.  Consultants:   None  Procedures:   None  Antimicrobials:  Remdesivir 8/31 - 9/4  Subjective: No chagne from yesterday. No fevers but still severely short of breath with standing/short ambulation. Did prone for about 5 hours last night. Wife doing better. Eating ok. Frustrated at stagnant clinical response. No chest pain or palpitations. No productive cough or dysuria reported.   Objective: Vitals:   05/23/19 0400 05/23/19 0410 05/23/19 0739 05/23/19 0833  BP:  121/61 (!) 116/59   Pulse: (!) 44 (!) 56 (!) 58   Resp:   18   Temp: 98.1 F (36.7 C)  98.4 F (36.9 C)   TempSrc: Oral  Oral   SpO2: 100% 94% 90% 93%  Weight:      Height:        Intake/Output Summary (Last 24 hours) at 05/23/2019 0940 Last data filed at 05/23/2019 0741 Gross per 24 hour  Intake 300 ml  Output 1900 ml  Net -1600 ml   Filed Weights   05/18/19 2028 05/19/19 0612 05/19/19 1141  Weight: 104.3 kg 113.4 kg 113.4 kg  Gen: 68 y.o. male in no distress Pulm: Nonlabored tachypnea with supplemental oxygen at rest, down into high 80%'s intermittently, becomes SOB with long sentences. Crackles stable.  CV: Regular rate and rhythm. No murmur, rub, or gallop. No JVD, no dependent edema. GI: Abdomen soft, non-tender, non-distended, with normoactive bowel sounds.  Ext: Warm, no deformities Skin: No rashes, lesions or ulcers on visualized skin.  Neuro: Alert and oriented. No focal neurological deficits. Psych: Judgement and insight appear fair. Mood anxious and depressed & affect congruent. Behavior is appropriate, remains pleasant.  Data Reviewed: I have personally reviewed following labs and imaging studies  CBC: Recent Labs  Lab 05/18/19 2240 05/20/19 0510 05/21/19 0547 05/22/19 0245 05/23/19 0035  WBC 5.8 6.8 8.7 9.1 9.1  NEUTROABS 4.7 5.5 7.4 7.9* 7.6  HGB 14.4 13.9 13.0 12.7* 13.0  HCT 43.2 41.0 39.3 38.9* 39.0  MCV 90.6 90.3 90.6 91.1 90.3  PLT 176 204 175 244 0000000   Basic Metabolic Panel: Recent Labs  Lab 05/18/19 2240 05/20/19 0510 05/21/19 0547 05/22/19 0245 05/23/19 0035  NA 133* 135 140 138 138  K 3.9 4.2 4.4 4.4 4.5  CL 97* 102 109 106 107  CO2 22 23 19* 22 23  GLUCOSE 107* 113* 105* 101* 119*  BUN 16 20 22  25* 26*  CREATININE 0.92 0.78 0.79 0.84 0.86  CALCIUM 8.6* 8.6* 8.5* 8.7* 8.9   GFR: Estimated Creatinine Clearance: 109.8 mL/min (by C-G formula based on SCr of 0.86 mg/dL). Liver Function Tests: Recent Labs  Lab 05/18/19 2240 05/20/19 0510 05/21/19 0547 05/22/19 0245 05/23/19 0035  AST 54* 45* 38 39 36  ALT 31 28 28 31  34  ALKPHOS 49 45 51 48 49  BILITOT 0.6 0.6 0.5 0.6 0.8  PROT 7.6 7.3 6.6 6.5 6.9  ALBUMIN 3.3* 3.0* 2.8* 2.8* 2.8*   Recent Results (from the past 240 hour(s))  SARS Coronavirus 2 [facility] order, Performed in [facility] hospital lab) Nasopharyngeal Nasopharyngeal Swab     Status: Abnormal   Collection Time: 05/18/19 10:40 PM   Specimen: Nasopharyngeal Swab  Result Value Ref Range Status   SARS Coronavirus 2 POSITIVE (A) NEGATIVE Final    Comment: RESULT CALLED TO, READ BACK BY AND VERIFIED WITH: FARRAR,S RN @0212  ON 05/19/2019 JACKSON,K (NOTE) If result is NEGATIVE SARS-CoV-2 target nucleic acids are NOT DETECTED. The SARS-CoV-2 RNA is generally detectable in upper and lower  respiratory specimens during the acute phase of infection. The lowest   concentration of SARS-CoV-2 viral copies this assay can detect is 250  copies / mL. A negative result does not preclude SARS-CoV-2 infection  and should not be used as the sole basis for treatment or other  patient management decisions.  A negative result may occur with  improper specimen collection / handling, submission of specimen other  than nasopharyngeal swab, presence of viral mutation(s) within the  areas targeted by this assay, and inadequate number of viral copies  (<250 copies / mL). A negative result must be combined with clinical  observations, patient history, and epidemiological information. If result is POSITIVE SARS-CoV-2 target nucleic acids are DETECTE D. The SARS-CoV-2 RNA is generally detectable in upper and lower  respiratory specimens during the acute phase of infection.  Positive  results are indicative of active infection with SARS-CoV-2.  Clinical  correlation with patient history and other diagnostic information is  necessary to determine patient infection status.  Positive results do  not rule out bacterial infection or co-infection with other viruses.  If result is PRESUMPTIVE POSTIVE SARS-CoV-2 nucleic acids MAY BE PRESENT.   A presumptive positive result was obtained on the submitted specimen  and confirmed on repeat testing.  While 2019 novel coronavirus  (SARS-CoV-2) nucleic acids may be present in the submitted sample  additional confirmatory testing may be necessary for epidemiological  and / or clinical management purposes  to differentiate between  SARS-CoV-2 and other Sarbecovirus currently known to infect humans.  If clinically indicated additional testing with an alternate test  methodology (LAB745 3) is advised. The SARS-CoV-2 RNA is generally  detectable in upper and lower respiratory specimens during the acute  phase of infection. The expected result is Negative. Fact Sheet for Patients:  StrictlyIdeas.no Fact Sheet  for Healthcare Providers: BankingDealers.co.za This test is not yet approved or cleared by the [place] FDA and has been authorized for detection and/or diagnosis of SARS-CoV-2 by FDA under an Emergency Use Authorization (EUA).  This EUA will remain in effect (meaning this test can be used) for the duration of the COVID-19 declaration under Section 564(b)(1) of the Act, 21 U.S.C. section 360bbb-3(b)(1), unless the authorization is terminated or revoked sooner. Performed at [facility], [place] [address] [place], [place] [address]   Blood Culture (routine x 2)     Status: None (Preliminary result)   Collection Time: 05/18/19 10:40 PM   Specimen: BLOOD  Result Value Ref Range Status   Specimen Description   Final    BLOOD RIGHT ANTECUBITAL Performed at [facility] [address], [place], [place] [address]    Special Requests   Final    BOTTLES DRAWN AEROBIC AND ANAEROBIC Blood Culture results may not be optimal due to an excessive volume of blood received in culture bottles Performed at [facility] [address], [place], [place] [address]    Culture   Final    NO GROWTH 3 DAYS Performed at [facility], [place] [address], [place], [place] [address]    Report Status PENDING  Incomplete  Blood Culture (routine x 2)     Status: None (Preliminary result)   Collection Time: 05/18/19 10:45 PM   Specimen: BLOOD  Result Value Ref Range Status   Specimen Description   Final    BLOOD LEFT ANTECUBITAL Performed at [facility] [address] [place], [place] [address]    Special Requests   Final    BOTTLES DRAWN AEROBIC AND ANAEROBIC Blood Culture results may not be optimal due to an excessive volume of blood received in culture bottles Performed at [facility] [address] [place],  [address]    Culture   Final    NO GROWTH 3 DAYS Performed at  [facility], [place] [address], [place],  [address]    Report Status PENDING  Incomplete      Radiology Studies: No results found.  Scheduled Meds: . dexamethasone  6 mg Oral Q12H  . enoxaparin (LOVENOX) injection  40 mg Subcutaneous BID  . levothyroxine  75 mcg Oral QAC breakfast  . loratadine  10 mg Oral Daily  . pantoprazole  40 mg Oral Daily  . pravastatin  40 mg Oral q1800   Continuous Infusions:    LOS: 4 days   Time spent: 35 minutes.  Patrecia Pour, MD [facility] [email or web address] Password [email or web address] 05/23/2019, 9:40 AM

## 2019-05-23 NOTE — Progress Notes (Signed)
Physical Therapy Treatment Patient Details Name: Colin [name] MRN: [id number] DOB: Mar 19, 1951 Today's Date: 05/23/2019    History of Present Illness 68 y.o. male past medical history significant for hypertension, GERD osteoarthritis history of skin cancer decreased hearing and depression who tested positive for COVID-19 on [facility] last week was feeling fine but has gradually had progressive weakness and shortness of breath.  He checked his oxygen saturation at home 05/18/19 and was in the 80s so he came into the ED.  +pna, hypoxia    PT Comments    Patient tolerated ambulation better today: 150 ft at slow pace on 4L with sats 95-89%. Upon sitting to rest, sats dropped to 84% and with vc and improved position in sitting, pt returned to 88% after 4 minutes and ultimately to 94%. Instructed in standing exercises he can do at side of bed holding onto bed rail.    Follow Up Recommendations  Home health PT     Equipment Recommendations  None recommended by PT    Recommendations for Other Services       Precautions / Restrictions Precautions Precautions: Other (comment) Precaution Comments: monitor sats Restrictions Weight Bearing Restrictions: No    Mobility  Bed Mobility               General bed mobility comments: OOB in recliner at beginning and end of session  Transfers Overall transfer level: Needs assistance Equipment used: None Transfers: Sit to/from Stand Sit to Stand: Supervision         General transfer comment: due to lines getting hung on chair  Ambulation/Gait Ambulation/Gait assistance: Min guard Gait Distance (Feet): 150 Feet(laps in room) Assistive device: None Gait Pattern/deviations: Step-through pattern;Decreased stride length;Wide base of support;Staggering right Gait velocity: slow due to resp status   General Gait Details: vc for wider BOS due to decr velocity for pulmonary status; +imbalance with ability to independently recover x 3   Stairs             Wheelchair Mobility    Modified Rankin (Stroke Patients Only)       Balance Overall balance assessment: Mild deficits observed, not formally tested                                          Cognition Arousal/Alertness: Awake/alert Behavior During Therapy: Flat affect;Anxious Overall Cognitive Status: Within Functional Limits for tasks assessed                                        Exercises General Exercises - Lower Extremity Hip ABduction/ADduction: AROM;Both;Standing(at bed rail; 3 reps each leg) Hip Flexion/Marching: AROM;Both;5 reps;Standing Mini-Sqauts: AROM;Strengthening;Both;5 reps;Standing Other Exercises Other Exercises: re-enforced use of IS; reinforced pursed lip breathing and use of visual imagery to slow his breathing rate    General Comments        Pertinent Vitals/Pain Pain Assessment: No/denies pain    Home Living                      Prior Function            PT Goals (current goals can now be found in the care plan section) Acute Rehab PT Goals Patient Stated Goal: feel stronger before going home Time For Goal Achievement: 06/04/19 Potential to  Achieve Goals: Good Progress towards PT goals: Progressing toward goals    Frequency    Min 3X/week      PT Plan Current plan remains appropriate    Co-evaluation              AM-PAC PT "6 Clicks" Mobility   Outcome Measure  Help needed turning from your back to your side while in a flat bed without using bedrails?: None Help needed moving from lying on your back to sitting on the side of a flat bed without using bedrails?: None Help needed moving to and from a bed to a chair (including a wheelchair)?: A Little Help needed standing up from a chair using your arms (e.g., wheelchair or bedside chair)?: A Little Help needed to walk in hospital room?: A Little Help needed climbing 3-5 steps with a railing? : A Little 6 Click  Score: 20    End of Session Equipment Utilized During Treatment: Oxygen Activity Tolerance: Treatment limited secondary to medical complications (Comment)(decreasing sats) Patient left: in chair;with call bell/phone within reach   PT Visit Diagnosis: Muscle weakness (generalized) (M62.81);Difficulty in walking, not elsewhere classified (R26.2)     Time: [id number] PT Time Calculation (min) (ACUTE ONLY): 33 min  Charges:  $Gait Training: 23-37 mins                       Barry Brunner, PT       Rexanne Mano 05/23/2019, 12:55 PM

## 2019-05-23 NOTE — TOC Initial Note (Signed)
Transition of Care [facility] - Initial/Assessment Note    Patient Details  Name: Colin [name] MRN: [id number] Date of Birth: May 02, 1951  Transition of Care [facility]) CM/SW Contact:    Weston Anna, LCSW Phone Number: 05/23/2019, 8:47 AM  Clinical Narrative:                  CSW spoke with patients spouse, [name], regarding discharge plans. Spouse is agreeable to home health at this time- MD placed orders for PT/OT. Patient has previously used [facility] and is open to using them again. CSW reached out to [place] with [facility] and referral was accepted.   No other needs at this time.   Expected Discharge Plan: [place] Barriers to Discharge: Continued Medical Work up   Patient Goals and CMS Choice     Choice offered to / list presented to : Spouse  Expected Discharge Plan and Services Expected Discharge Plan: [place] In-house Referral: Clinical Social Work   Post Acute Care Choice: Home Health, Durable Medical Equipment Living arrangements for the past 2 months: Single Family Home Expected Discharge Date: (unknown)               DME Arranged: N/A         HH Arranged: PT, OT HH Agency: [facility] [employer] Date [place]: 05/22/19 Time [place]: 1000 Representative spoke with at [place]: [place]  Prior Living Arrangements/Services Living arrangements for the past 2 months: [place] with:: Spouse   Do you feel safe going back to the place where you live?: Yes      Need for Family Participation in Patient Care: Yes (Comment) Care giver support system in place?: Yes (comment)      Activities of Daily Living Home Assistive Devices/Equipment: Cane (specify quad or straight), Eyeglasses, Hand-held shower hose, Built-in shower seat, Bedside commode/3-in-1, Walker (specify type)(single point cane, front wheeled walker) ADL Screening (condition at time of  admission) Patient's cognitive ability adequate to safely complete daily activities?: Yes Is the patient deaf or have difficulty hearing?: No Does the patient have difficulty seeing, even when wearing glasses/contacts?: No Does the patient have difficulty concentrating, remembering, or making decisions?: No Patient able to express need for assistance with ADLs?: Yes Does the patient have difficulty dressing or bathing?: Yes Independently performs ADLs?: No(patient recently dx with covid and has been feeling very tired and weak) Communication: Independent Dressing (OT): Needs assistance Is this a change from baseline?: Change from baseline, expected to last >3 days Grooming: Needs assistance Is this a change from baseline?: Change from baseline, expected to last >3 days Feeding: Independent Is this a change from baseline?: Pre-admission baseline Bathing: Needs assistance Is this a change from baseline?: Change from baseline, expected to last >3 days Toileting: Needs assistance Is this a change from baseline?: Change from baseline, expected to last >3days In/Out Bed: Needs assistance Is this a change from baseline?: Change from baseline, expected to last >3 days Walks in Home: Needs assistance Is this a change from baseline?: Change from baseline, expected to last >3 days Does the patient have difficulty walking or climbing stairs?: Yes Weakness of Legs: Both Weakness of Arms/Hands: Both  Permission Sought/Granted                  Emotional Assessment           Psych Involvement: No (comment)  Admission diagnosis:  Tachypnea [R06.82] Hypoxia [R09.02] COVID-19 virus infection [U07.1] Patient Active Problem List   Diagnosis Date Noted  . Pneumonia due to COVID-19 virus 05/19/2019  . Hypothyroidism 05/19/2019  . GERD (gastroesophageal reflux disease) 05/19/2019  . Hyperlipidemia 05/19/2019  . Acute respiratory failure with hypoxia ([place]) 05/19/2019  . Hyponatremia  05/19/2019  . OA (osteoarthritis) of hip 04/05/2016  . Dyspnea 04/19/2012  . Preop cardiovascular exam 04/19/2012  . Hypertension    PCP:  Antony Contras, MD Pharmacy:   [id number] [place], [place] - [address] [place] [address] Phone: [phone] Fax: [phone]     Social Determinants of Health (SDOH) Interventions    Readmission Risk Interventions No flowsheet data found.

## 2019-05-24 LAB — CBC WITH DIFFERENTIAL/PLATELET
Abs Immature Granulocytes: 0.42 10*3/uL — ABNORMAL HIGH (ref 0.00–0.07)
Basophils Absolute: 0.1 10*3/uL (ref 0.0–0.1)
Basophils Relative: 0 %
Eosinophils Absolute: 0 10*3/uL (ref 0.0–0.5)
Eosinophils Relative: 0 %
HCT: 41.7 % (ref 39.0–52.0)
Hemoglobin: 13.9 g/dL (ref 13.0–17.0)
Immature Granulocytes: 3 %
Lymphocytes Relative: 5 %
Lymphs Abs: 0.6 10*3/uL — ABNORMAL LOW (ref 0.7–4.0)
MCH: 30 pg (ref 26.0–34.0)
MCHC: 33.3 g/dL (ref 30.0–36.0)
MCV: 89.9 fL (ref 80.0–100.0)
Monocytes Absolute: 0.6 10*3/uL (ref 0.1–1.0)
Monocytes Relative: 5 %
Neutro Abs: 11.3 10*3/uL — ABNORMAL HIGH (ref 1.7–7.7)
Neutrophils Relative %: 87 %
Platelets: 329 10*3/uL (ref 150–400)
RBC: 4.64 MIL/uL (ref 4.22–5.81)
RDW: 13.5 % (ref 11.5–15.5)
WBC: 13 10*3/uL — ABNORMAL HIGH (ref 4.0–10.5)
nRBC: 0.2 % (ref 0.0–0.2)

## 2019-05-24 LAB — C-REACTIVE PROTEIN: CRP: 1.7 mg/dL — ABNORMAL HIGH (ref <1.0)

## 2019-05-24 LAB — CULTURE, BLOOD (ROUTINE X 2)
Culture: NO GROWTH
Culture: NO GROWTH

## 2019-05-24 LAB — COMPREHENSIVE METABOLIC PANEL
ALT: 42 U/L (ref 0–44)
AST: 38 U/L (ref 15–41)
Albumin: 3 g/dL — ABNORMAL LOW (ref 3.5–5.0)
Alkaline Phosphatase: 55 U/L (ref 38–126)
Anion gap: 12 (ref 5–15)
BUN: 27 mg/dL — ABNORMAL HIGH (ref 8–23)
CO2: 21 mmol/L — ABNORMAL LOW (ref 22–32)
Calcium: 9.4 mg/dL (ref 8.9–10.3)
Chloride: 105 mmol/L (ref 98–111)
Creatinine, Ser: 0.89 mg/dL (ref 0.61–1.24)
GFR calc Af Amer: >60 mL/min (ref >60)
GFR calc non Af Amer: >60 mL/min (ref >60)
Glucose, Bld: 120 mg/dL — ABNORMAL HIGH (ref 70–99)
Potassium: 4.6 mmol/L (ref 3.5–5.1)
Sodium: 138 mmol/L (ref 135–145)
Total Bilirubin: 0.5 mg/dL (ref 0.3–1.2)
Total Protein: 7.2 g/dL (ref 6.5–8.1)

## 2019-05-24 LAB — D-DIMER, QUANTITATIVE: D-Dimer, Quant: 0.98 ug/mL-FEU — ABNORMAL HIGH (ref 0.00–0.50)

## 2019-05-24 NOTE — Progress Notes (Signed)
Physical Therapy Treatment Patient Details Name: Colin [name] MRN: [id number] DOB: September 17, 1951 Today's Date: 05/24/2019    History of Present Illness 68 y.o. male past medical history significant for hypertension, GERD osteoarthritis history of skin cancer decreased hearing and depression who [facility] for [facility] was feeling fine but has gradually had progressive weakness and shortness of breath.  He checked his oxygen saturation at home 05/18/19 and was in the 80s so he came into the ED.  +pna, hypoxia    PT Comments    Pt continues to be anxious with mobility, reporting he feels as if he is suffocating.  He is able to implement breathing and visualization techniques without cues today.  Education reinforced re: energy conservation.  He was able to walk down the hallway with 4 standing rest breaks and O2 sats lowest 81% as measured by earlobe probe.  O2 increased to 4L during gait, but back down to 2 L at end of session.  PT will continue to follow acutely for safe mobility progression  Follow Up Recommendations  Home health PT     Equipment Recommendations  Other (comment)(likely home O2)    Recommendations for Other Services   NA     Precautions / Restrictions Precautions Precautions: Other (comment) Precaution Comments: monitor sats    Mobility  Bed Mobility Overal bed mobility: Modified Independent                Transfers Overall transfer level: Needs assistance Equipment used: None Transfers: Sit to/from Stand Sit to Stand: Supervision         General transfer comment: supervision for safety  Ambulation/Gait Ambulation/Gait assistance: Supervision Gait Distance (Feet): 60 Feet(4 standing rest breaks for PLB) Assistive device: None Gait Pattern/deviations: Step-through pattern;Staggering left;Staggering right Gait velocity: decreased Gait velocity interpretation: <1.8 ft/sec, indicate of risk for recurrent falls General Gait  Details: Very mildly staggering gait pattern, cues for slow speed, at times reaching for rail, standing rest breaks x 4 for PLB.  Lowest O2 sat was 81% on 4 L O2 [place] during mobility.  I was able to turn him back down to 2L at end of session.           Balance Overall balance assessment: Needs assistance Sitting-balance support: Feet supported;No upper extremity supported Sitting balance-Leahy Scale: Good     Standing balance support: No upper extremity supported Standing balance-Leahy Scale: Fair                              Cognition Arousal/Alertness: Awake/alert Behavior During Therapy: Anxious Overall Cognitive Status: Within Functional Limits for tasks assessed                                        Exercises General Exercises - Lower Extremity Hip ABduction/ADduction: AROM;Both;Standing(at bed rail; 3 reps each leg) Hip Flexion/Marching: AROM;Both;5 reps;Standing Mini-Sqauts: AROM;Strengthening;Both;5 reps;Standing Other Exercises Other Exercises: 2 sets of each exercise with 5-6 min seated rest in between.  Pt reports compliance with IS and flutter valve.  Also reports proneing frequently.          Pertinent Vitals/Pain Pain Assessment: No/denies pain           PT Goals (current goals can now be found in the care plan section) Acute Rehab PT Goals Patient Stated Goal: feel stronger before  going home Progress towards PT goals: Progressing toward goals    Frequency    Min 3X/week      PT Plan Current plan remains appropriate       AM-PAC PT "6 Clicks" Mobility   Outcome Measure  Help needed turning from your back to your side while in a flat bed without using bedrails?: None Help needed moving from lying on your back to sitting on the side of a flat bed without using bedrails?: None Help needed moving to and from a bed to a chair (including a wheelchair)?: None Help needed standing up from a chair using your arms (e.g.,  wheelchair or bedside chair)?: None Help needed to walk in hospital room?: None Help needed climbing 3-5 steps with a railing? : A Little 6 Click Score: 23    End of Session Equipment Utilized During Treatment: Oxygen(2-4 L) Activity Tolerance: Patient limited by fatigue Patient left: in bed;with call bell/phone within reach   PT Visit Diagnosis: Muscle weakness (generalized) (M62.81);Difficulty in walking, not elsewhere classified (R26.2)     Time: [id number] PT Time Calculation (min) (ACUTE ONLY): 62 min  Charges:  $Gait Training: 23-37 mins $Therapeutic Exercise: 23-37 mins                    Keldan Eplin B. Ashtyn Meland, PT, DPT  Acute Rehabilitation [phone] pager [phone] office  @ [facility]: [phone]   05/24/2019, 2:50 PM

## 2019-05-24 NOTE — Progress Notes (Signed)
PROGRESS NOTE  RONELLE ATTIG  [id number] DOB: [birth year] DOA: 05/18/2019 PCP: Antony Contras, MD   Brief Narrative: RHILEY SCHRANDT is a 68 y.o. male with a history of HTN, GERD who presented to the ED on 8/30 after becoming short of breath with hypoxia on home oximetry in the setting of having increasing weakness and positive covid testing earlier in the week. On arrival he was febrile to 101.66F and hypoxic requiring 4L O2 with bilateral CXR infiltrates. He was admitted to [facility], started on steroids and remdesivir. Clinical progress has been slow, though he has not shown evidence of decompensation and inflammatory markers are improving.  Assessment & Plan: Principal Problem:   Pneumonia due to COVID-19 virus Active Problems:   Dyspnea   Hypertension   Hypothyroidism   GERD (gastroesophageal reflux disease)   Hyperlipidemia   Acute respiratory failure with hypoxia (HCC)   Hyponatremia  Acute hypoxic respiratory failure due to covid-19 pneumonia: Anticipate continued protracted recovery.  - Completed remdesivir x5 days on 9/4.  - Continue steroids, increased to BID dosing 9/3. Will continue x10 days (8/30 - 9/8). If remains on higher oxygen requirement, would perform CT chest.  - Monitor for secondary infection and alternative sources of hypoxia. D-dimer overall trending downward. WBC up today, but no change in cough or other symptoms of localized infection. Will monitor this. - Trend inflammatory markers. CRP down again today. - IS encouraged, antitussives prn ordered.  - D/w pt and wife on 9/2 the risks and benefits of non-approved therapies including hydroxychloroquine and IL6 inhibitors. At this time feel the risks are not outweighed by ptoential benefits which have not been borne out in the available RCTs at this time. Also discussed CCP which is a consideration given recent EUA by the FDA, though no conclusive clinical trial has proven efficacy at this time. We decided that if he  deteriorates at all clinically, we would consider this in high titer form if available. - Emphasized OOB, increasing mobility as tolerated as well as incentive spirometry to aid with recruitment. His severe desaturations this morning right after awakening in contrast to his improved performance ambulating 1102feet yesterday suggests atelectasis is playing a large role.  Insomnia, anxiety:  - Continue with trazodone  - Can give prn if this is adversely impacting subjective dyspnea.  Hypothyroidism: TSH 0.353. - Continue synthroid   Hyperlipidemia:  - Continue statin  HTN:  - Continue current Tx  Hypovolemic hyponatremia: Resolved.   DVT prophylaxis: Lovenox Code Status: Full Family Communication: Wife on facetime throughout encounter. Disposition Plan: Anticipate DC home w/HH pending clinical improvement.  Consultants:   None  Procedures:   None  Antimicrobials:  Remdesivir 8/31 - 9/4  Subjective: When getting up this morning, stood by bed, took a couple steps to chair and became severely short of breath which improved over the course of 5-10 minutes and was associated with a drop in SpO2 into 70%'s which I witnessed. He's trying to lay prone some, using IS, and frustrated/discouraged by lack of progress of late. Walked 140ft yesterday with SpO2 89-95% on 4L.  Objective: Vitals:   05/23/19 1930 05/24/19 0540 05/24/19 0835 05/24/19 0900  BP: (!) 160/90 134/65 127/73   Pulse: 76 (!) 58 (!) 57 65  Resp:   18   Temp: 98.7 F (37.1 C) 98.6 F (37 C) 97.6 F (36.4 C)   TempSrc: Oral Oral Axillary   SpO2: (!) 85% 99% 97% 93%  Weight:      Height:  Intake/Output Summary (Last 24 hours) at 05/24/2019 0922 Last data filed at 05/24/2019 0545 Gross per 24 hour  Intake 840 ml  Output 1550 ml  Net -710 ml   Filed Weights   05/18/19 2028 05/19/19 0612 05/19/19 1141  Weight: 104.3 kg 113.4 kg 113.4 kg   Gen: 68 y.o. male in no distress Pulm: Tachypneic without  accessory muscle use, crackles bilaterally with good aeration. No wheezes. CV: Regular borderline bradycardia. No murmur, rub, or gallop. No JVD, no dependent edema. GI: Abdomen soft, non-tender, non-distended, with normoactive bowel sounds.  Ext: Warm, no deformities Skin: No new rashes, lesions or ulcers on visualized skin. Neuro: Alert and oriented. No focal neurological deficits. Psych: Judgement and insight appear normal. Mood solemn & affect congruent. Behavior is appropriate/pleasant.    Data Reviewed: I have personally reviewed following labs and imaging studies  CBC: Recent Labs  Lab 05/20/19 0510 05/21/19 0547 05/22/19 0245 05/23/19 0035 05/24/19 0202  WBC 6.8 8.7 9.1 9.1 13.0*  NEUTROABS 5.5 7.4 7.9* 7.6 11.3*  HGB 13.9 13.0 12.7* 13.0 13.9  HCT 41.0 39.3 38.9* 39.0 41.7  MCV 90.3 90.6 91.1 90.3 89.9  PLT 204 175 244 261 [id number]   Basic Metabolic Panel: Recent Labs  Lab 05/20/19 0510 05/21/19 0547 05/22/19 0245 05/23/19 0035 05/24/19 0202  NA 135 140 138 138 138  K 4.2 4.4 4.4 4.5 4.6  CL 102 109 106 107 105  CO2 23 19* 22 23 21*  GLUCOSE 113* 105* 101* 119* 120*  BUN 20 22 25* 26* 27*  CREATININE 0.78 0.79 0.84 0.86 0.89  CALCIUM 8.6* 8.5* 8.7* 8.9 9.4   GFR: Estimated Creatinine Clearance: 106.1 mL/min (by C-G formula based on SCr of 0.89 mg/dL). Liver Function Tests: Recent Labs  Lab 05/20/19 0510 05/21/19 0547 05/22/19 0245 05/23/19 0035 05/24/19 0202  AST 45* 38 39 36 38  ALT 28 28 31  34 42  ALKPHOS 45 51 48 49 55  BILITOT 0.6 0.5 0.6 0.8 0.5  PROT 7.3 6.6 6.5 6.9 7.2  ALBUMIN 3.0* 2.8* 2.8* 2.8* 3.0*   Recent Results (from the past 240 hour(s))  SARS Coronavirus 2 [facility] order, Performed in [facility] hospital lab) Nasopharyngeal Nasopharyngeal Swab     Status: Abnormal   Collection Time: 05/18/19 10:40 PM   Specimen: Nasopharyngeal Swab  Result Value Ref Range Status   SARS Coronavirus 2 POSITIVE (A) NEGATIVE Final    Comment: RESULT  CALLED TO, READ BACK BY AND VERIFIED WITH: FARRAR,S RN @0212  ON 05/19/2019 JACKSON,K (NOTE) If result is NEGATIVE SARS-CoV-2 target nucleic acids are NOT DETECTED. The SARS-CoV-2 RNA is generally detectable in upper and lower  respiratory specimens during the acute phase of infection. The lowest  concentration of SARS-CoV-2 viral copies this assay can detect is 250  copies / mL. A negative result does not preclude SARS-CoV-2 infection  and should not be used as the sole basis for treatment or other  patient management decisions.  A negative result may occur with  improper specimen collection / handling, submission of specimen other  than nasopharyngeal swab, presence of viral mutation(s) within the  areas targeted by this assay, and inadequate number of viral copies  (<250 copies / mL). A negative result must be combined with clinical  observations, patient history, and epidemiological information. If result is POSITIVE SARS-CoV-2 target nucleic acids are DETECTE D. The SARS-CoV-2 RNA is generally detectable in upper and lower  respiratory specimens during the acute phase of infection.  Positive  results are indicative of active infection with SARS-CoV-2.  Clinical  correlation with patient history and other diagnostic information is  necessary to determine patient infection status.  Positive results do  not rule out bacterial infection or co-infection with other viruses. If result is PRESUMPTIVE POSTIVE SARS-CoV-2 nucleic acids MAY BE PRESENT.   A presumptive positive result was obtained on the submitted specimen  and confirmed on repeat testing.  While 2019 novel coronavirus  (SARS-CoV-2) nucleic acids may be present in the submitted sample  additional confirmatory testing may be necessary for epidemiological  and / or clinical management purposes  to differentiate between  SARS-CoV-2 and other Sarbecovirus currently known to infect humans.  If clinically indicated additional  testing with an alternate test  methodology (LAB745 3) is advised. The SARS-CoV-2 RNA is generally  detectable in upper and lower respiratory specimens during the acute  phase of infection. The expected result is Negative. Fact Sheet for Patients:  StrictlyIdeas.no Fact Sheet for Healthcare Providers: BankingDealers.co.za This test is not yet approved or cleared by the [place] FDA and has been authorized for detection and/or diagnosis of SARS-CoV-2 by FDA under an Emergency Use Authorization (EUA).  This EUA will remain in effect (meaning this test can be used) for the duration of the COVID-19 declaration under Section 564(b)(1) of the Act, 21 U.S.C. section 360bbb-3(b)(1), unless the authorization is terminated or revoked sooner. Performed at [facility], [place] [address], [place], [place] [address]   Blood Culture (routine x 2)     Status: None (Preliminary result)   Collection Time: 05/18/19 10:40 PM   Specimen: BLOOD  Result Value Ref Range Status   Specimen Description   Final    BLOOD RIGHT ANTECUBITAL Performed at [facility] [address], [place], [place] [address]    Special Requests   Final    BOTTLES DRAWN AEROBIC AND ANAEROBIC Blood Culture results may not be optimal due to an excessive volume of blood received in culture bottles Performed at [facility] [address] [place], [place] [address]    Culture   Final    NO GROWTH 4 DAYS Performed at [facility], [place] [address] [place], [place] [address]    Report Status PENDING  Incomplete  Blood Culture (routine x 2)     Status: None (Preliminary result)   Collection Time: 05/18/19 10:45 PM   Specimen: BLOOD  Result Value Ref Range Status   Specimen Description   Final    BLOOD LEFT ANTECUBITAL Performed at [facility] [address], [place], [place] [address]    Special  Requests   Final    BOTTLES DRAWN AEROBIC AND ANAEROBIC Blood Culture results may not be optimal due to an excessive volume of blood received in culture bottles Performed at [facility] [address], [place], [place] [address]    Culture   Final    NO GROWTH 4 DAYS Performed at [facility], [place] [address] [place], [place] [address]    Report Status PENDING  Incomplete      Radiology Studies: No results found.  Scheduled Meds: . dexamethasone  6 mg Oral Q12H  . enoxaparin (LOVENOX) injection  40 mg Subcutaneous BID  . levothyroxine  75 mcg Oral QAC breakfast  . loratadine  10 mg Oral Daily  . pantoprazole  40 mg Oral Daily  . pravastatin  40 mg Oral q1800   Continuous Infusions:  LOS: 5 days   Time spent: 35 minutes.  Patrecia Pour, MD [facility] [email or web address] Password [email or web address] 05/24/2019, 9:22 AM

## 2019-05-25 DIAGNOSIS — E785 Hyperlipidemia, unspecified: Secondary | ICD-10-CM

## 2019-05-25 NOTE — Progress Notes (Signed)
PROGRESS NOTE  Colin [name]  [id number] DOB: 08-01-1951 DOA: 05/18/2019 PCP: Antony Contras, MD   Brief Narrative: Colin [name] is a 68 y.o. male with a history of HTN, GERD who presented to the ED on 8/30 after becoming short of breath with hypoxia on home oximetry in the setting of having increasing weakness and positive covid testing earlier in the week. On arrival he was febrile to 101.53F and hypoxic requiring 4L O2 with bilateral CXR infiltrates. He was admitted to [facility], started on steroids and remdesivir. Clinical progress has been slow, though he has not shown evidence of decompensation and inflammatory markers are improving.  Assessment & Plan: Principal Problem:   Pneumonia due to COVID-19 virus Active Problems:   Dyspnea   Hypertension   Hypothyroidism   GERD (gastroesophageal reflux disease)   Hyperlipidemia   Acute respiratory failure with hypoxia (HCC)   Hyponatremia  Acute hypoxic respiratory failure due to covid-19 pneumonia: Anticipate continued protracted recovery.  - Completed remdesivir x5 days on 9/4.  - Continue steroids, increased to BID dosing 9/3. Will continue x10 days (8/30 - 9/8).  - If remains on higher oxygen requirement, would perform CT chest.  - Monitor for secondary infection and alternative sources of hypoxia. D-dimer overall trending downward. WBC up today, but no change in cough or other symptoms of localized infection. Repeat labs in AM.  - IS encouraged, antitussives prn ordered.  - D/w pt and wife on 9/2 the risks and benefits of non-approved therapies including hydroxychloroquine and IL6 inhibitors. At this time feel the risks are not outweighed by ptoential benefits which have not been borne out in the available RCTs at this time. Also discussed CCP which is a consideration given recent EUA by the FDA, though no conclusive clinical trial has proven efficacy at this time. We decided that if he deteriorates at all clinically, we would consider  this in high titer form if available. - Emphasized OOB, increasing mobility as tolerated. PT to continue as able. Home health currently recommended  Insomnia, anxiety:  - Continue with trazodone  - Can give prn if this is adversely impacting subjective dyspnea.  Hypothyroidism: TSH 0.353. - Continue synthroid   Hyperlipidemia:  - Continue statin  HTN:  - Continue current Tx  Hypovolemic hyponatremia: Resolved.   DVT prophylaxis: Lovenox Code Status: Full Family Communication: Wife on facetime throughout encounter. Disposition Plan: Anticipate DC home w/HH pending clinical improvement. Given recent gains, could be as soon as 24-48 hours.  Consultants:   None  Procedures:   None  Antimicrobials:  Remdesivir 8/31 - 9/4  Subjective: Feels encouraged for the first time today, as his work of breathing is less even with limited exertion. Was able to walk farther with PT yesterday than before. A close friend was admitted here last night. Down to 1L at rest and nadir at 81% on 4L w/prolonged ambulation yesterday.  Objective: Vitals:   05/24/19 1600 05/24/19 2108 05/25/19 0040 05/25/19 0909  BP:  120/74 114/63 (!) 147/74  Pulse:  72 (!) 51   Resp:      Temp: 98 F (36.7 C) (!) 97.4 F (36.3 C) 98.7 F (37.1 C) 97.8 F (36.6 C)  TempSrc: Oral Oral Oral Oral  SpO2:  97% 97%   Weight:      Height:        Intake/Output Summary (Last 24 hours) at 05/25/2019 1013 Last data filed at 05/25/2019 0055 Gross per 24 hour  Intake 720 ml  Output 1350  ml  Net -630 ml   Filed Weights   05/18/19 2028 05/19/19 0612 05/19/19 1141  Weight: 104.3 kg 113.4 kg 113.4 kg   Gen: 68 y.o. male in no distress Pulm: Nonlabored but tachypneic breathing room air for much of encounter at rest. Crackles clearing bilaterally. CV: Regular rate and rhythm. No murmur, rub, or gallop. No JVD, no dependent edema. GI: Abdomen soft, non-tender, non-distended, with normoactive bowel sounds.  Ext: Warm,  no deformities Skin: No rashes, lesions or ulcers on visualized skin. Neuro: Alert and oriented. No focal neurological deficits. Psych: Judgement and insight appear fair. Mood euthymic & affect congruent. Behavior is appropriate.    Data Reviewed: I have personally reviewed following labs and imaging studies  CBC: Recent Labs  Lab 05/20/19 0510 05/21/19 0547 05/22/19 0245 05/23/19 0035 05/24/19 0202  WBC 6.8 8.7 9.1 9.1 13.0*  NEUTROABS 5.5 7.4 7.9* 7.6 11.3*  HGB 13.9 13.0 12.7* 13.0 13.9  HCT 41.0 39.3 38.9* 39.0 41.7  MCV 90.3 90.6 91.1 90.3 89.9  PLT 204 175 244 261 [id number]   Basic Metabolic Panel: Recent Labs  Lab 05/20/19 0510 05/21/19 0547 05/22/19 0245 05/23/19 0035 05/24/19 0202  NA 135 140 138 138 138  K 4.2 4.4 4.4 4.5 4.6  CL 102 109 106 107 105  CO2 23 19* 22 23 21*  GLUCOSE 113* 105* 101* 119* 120*  BUN 20 22 25* 26* 27*  CREATININE 0.78 0.79 0.84 0.86 0.89  CALCIUM 8.6* 8.5* 8.7* 8.9 9.4   GFR: Estimated Creatinine Clearance: 106.1 mL/min (by C-G formula based on SCr of 0.89 mg/dL). Liver Function Tests: Recent Labs  Lab 05/20/19 0510 05/21/19 0547 05/22/19 0245 05/23/19 0035 05/24/19 0202  AST 45* 38 39 36 38  ALT 28 28 31  34 42  ALKPHOS 45 51 48 49 55  BILITOT 0.6 0.5 0.6 0.8 0.5  PROT 7.3 6.6 6.5 6.9 7.2  ALBUMIN 3.0* 2.8* 2.8* 2.8* 3.0*   Recent Results (from the past 240 hour(s))  SARS Coronavirus 2 [facility] order, Performed in [facility] hospital lab) Nasopharyngeal Nasopharyngeal Swab     Status: Abnormal   Collection Time: 05/18/19 10:40 PM   Specimen: Nasopharyngeal Swab  Result Value Ref Range Status   SARS Coronavirus 2 POSITIVE (A) NEGATIVE Final    Comment: RESULT CALLED TO, READ BACK BY AND VERIFIED WITH: FARRAR,S RN @0212  ON 05/19/2019 JACKSON,K (NOTE) If result is NEGATIVE SARS-CoV-2 target nucleic acids are NOT DETECTED. The SARS-CoV-2 RNA is generally detectable in upper and lower  respiratory specimens during the acute  phase of infection. The lowest  concentration of SARS-CoV-2 viral copies this assay can detect is 250  copies / mL. A negative result does not preclude SARS-CoV-2 infection  and should not be used as the sole basis for treatment or other  patient management decisions.  A negative result may occur with  improper specimen collection / handling, submission of specimen other  than nasopharyngeal swab, presence of viral mutation(s) within the  areas targeted by this assay, and inadequate number of viral copies  (<250 copies / mL). A negative result must be combined with clinical  observations, patient history, and epidemiological information. If result is POSITIVE SARS-CoV-2 target nucleic acids are DETECTE D. The SARS-CoV-2 RNA is generally detectable in upper and lower  respiratory specimens during the acute phase of infection.  Positive  results are indicative of active infection with SARS-CoV-2.  Clinical  correlation with patient history and other diagnostic information is  necessary  to determine patient infection status.  Positive results do  not rule out bacterial infection or co-infection with other viruses. If result is PRESUMPTIVE POSTIVE SARS-CoV-2 nucleic acids MAY BE PRESENT.   A presumptive positive result was obtained on the submitted specimen  and confirmed on repeat testing.  While 2019 novel coronavirus  (SARS-CoV-2) nucleic acids may be present in the submitted sample  additional confirmatory testing may be necessary for epidemiological  and / or clinical management purposes  to differentiate between  SARS-CoV-2 and other Sarbecovirus currently known to infect humans.  If clinically indicated additional testing with an alternate test  methodology (LAB745 3) is advised. The SARS-CoV-2 RNA is generally  detectable in upper and lower respiratory specimens during the acute  phase of infection. The expected result is Negative. Fact Sheet for Patients:   StrictlyIdeas.no Fact Sheet for Healthcare Providers: BankingDealers.co.za This test is not yet approved or cleared by the [place] FDA and has been authorized for detection and/or diagnosis of SARS-CoV-2 by FDA under an Emergency Use Authorization (EUA).  This EUA will remain in effect (meaning this test can be used) for the duration of the COVID-19 declaration under Section 564(b)(1) of the Act, 21 U.S.C. section 360bbb-3(b)(1), unless the authorization is terminated or revoked sooner. Performed at [facility], [place] [address], [place], [place] [address]   Blood Culture (routine x 2)     Status: None   Collection Time: 05/18/19 10:40 PM   Specimen: BLOOD  Result Value Ref Range Status   Specimen Description   Final    BLOOD RIGHT ANTECUBITAL Performed at [place] [address] [place], [place] [address]    Special Requests   Final    BOTTLES DRAWN AEROBIC AND ANAEROBIC Blood Culture results may not be optimal due to an excessive volume of blood received in culture bottles Performed at [place] [address], [place], [place] [address]    Culture   Final    NO GROWTH 5 DAYS Performed at [facility], [place] [address], [place], [place] [address]    Report Status 05/24/2019 FINAL  Final  Blood Culture (routine x 2)     Status: None   Collection Time: 05/18/19 10:45 PM   Specimen: BLOOD  Result Value Ref Range Status   Specimen Description   Final    BLOOD LEFT ANTECUBITAL Performed at [place] [address], [place], [place] [address]    Special Requests   Final    BOTTLES DRAWN AEROBIC AND ANAEROBIC Blood Culture results may not be optimal due to an excessive volume of blood received in culture bottles Performed at [place] [address], [place], [place] [address]    Culture   Final    NO GROWTH 5  DAYS Performed at [facility], [place] [address] [place],  [address]    Report Status 05/24/2019 FINAL  Final      Radiology Studies: No results found.  Scheduled Meds: . dexamethasone  6 mg Oral Q12H  . enoxaparin (LOVENOX) injection  40 mg Subcutaneous BID  . levothyroxine  75 mcg Oral QAC breakfast  . loratadine  10 mg Oral Daily  . pantoprazole  40 mg Oral Daily  . pravastatin  40 mg Oral q1800   Continuous Infusions:    LOS: 6 days   Time spent: 25 minutes.  Patrecia Pour, MD [facility] [email or web address] Password [email or web address] 05/25/2019, 10:13  AM  

## 2019-05-26 LAB — COMPREHENSIVE METABOLIC PANEL
ALT: 39 U/L (ref 0–44)
AST: 24 U/L (ref 15–41)
Albumin: 2.9 g/dL — ABNORMAL LOW (ref 3.5–5.0)
Alkaline Phosphatase: 46 U/L (ref 38–126)
Anion gap: 10 (ref 5–15)
BUN: 32 mg/dL — ABNORMAL HIGH (ref 8–23)
CO2: 22 mmol/L (ref 22–32)
Calcium: 9.2 mg/dL (ref 8.9–10.3)
Chloride: 104 mmol/L (ref 98–111)
Creatinine, Ser: 0.98 mg/dL (ref 0.61–1.24)
GFR calc Af Amer: >60 mL/min (ref >60)
GFR calc non Af Amer: >60 mL/min (ref >60)
Glucose, Bld: 109 mg/dL — ABNORMAL HIGH (ref 70–99)
Potassium: 4.6 mmol/L (ref 3.5–5.1)
Sodium: 136 mmol/L (ref 135–145)
Total Bilirubin: 0.8 mg/dL (ref 0.3–1.2)
Total Protein: 7 g/dL (ref 6.5–8.1)

## 2019-05-26 LAB — D-DIMER, QUANTITATIVE: D-Dimer, Quant: 0.73 ug/mL-FEU — ABNORMAL HIGH (ref 0.00–0.50)

## 2019-05-26 LAB — FERRITIN: Ferritin: 362 ng/mL — ABNORMAL HIGH (ref 24–336)

## 2019-05-26 LAB — CBC WITH DIFFERENTIAL/PLATELET
Abs Immature Granulocytes: 0.27 10*3/uL — ABNORMAL HIGH (ref 0.00–0.07)
Basophils Absolute: 0 10*3/uL (ref 0.0–0.1)
Basophils Relative: 0 %
Eosinophils Absolute: 0 10*3/uL (ref 0.0–0.5)
Eosinophils Relative: 0 %
HCT: 42.7 % (ref 39.0–52.0)
Hemoglobin: 14.1 g/dL (ref 13.0–17.0)
Immature Granulocytes: 3 %
Lymphocytes Relative: 6 %
Lymphs Abs: 0.6 10*3/uL — ABNORMAL LOW (ref 0.7–4.0)
MCH: 29.9 pg (ref 26.0–34.0)
MCHC: 33 g/dL (ref 30.0–36.0)
MCV: 90.7 fL (ref 80.0–100.0)
Monocytes Absolute: 0.6 10*3/uL (ref 0.1–1.0)
Monocytes Relative: 6 %
Neutro Abs: 8.6 10*3/uL — ABNORMAL HIGH (ref 1.7–7.7)
Neutrophils Relative %: 85 %
Platelets: 258 10*3/uL (ref 150–400)
RBC: 4.71 MIL/uL (ref 4.22–5.81)
RDW: 13.6 % (ref 11.5–15.5)
WBC: 10 10*3/uL (ref 4.0–10.5)
nRBC: 0 % (ref 0.0–0.2)

## 2019-05-26 LAB — PROCALCITONIN: Procalcitonin: <0.1 ng/mL

## 2019-05-26 LAB — C-REACTIVE PROTEIN: CRP: 0.8 mg/dL (ref <1.0)

## 2019-05-26 NOTE — Progress Notes (Signed)
SATURATION QUALIFICATIONS: (This note is used to comply with regulatory documentation for home oxygen)  Patient Saturations on Room Air at Rest = 90%  Patient Saturations on Room Air while Ambulating = 85%  Patient Saturations on 2 Liters of oxygen while Ambulating = 91%, dropped to 86% with return to sitting, increases to >90% with additional seated rest (approx 1-2 min).   Please briefly explain why patient needs home oxygen: Pt currently requires supplemental O2 to maintain adequate oxygen sats >90%.     Lou Cal, OT [facility] Pager [phone] Office [phone]

## 2019-05-26 NOTE — Progress Notes (Addendum)
Patient ambulating to bathroom, placed [facility]    05/26/19 1953  Oxygen Therapy  SpO2 (!) 83 %    2015 Patient sitting in chair at bedside, oxygen sats 91%. Removed nasal cannula. Patient sats remain at 91% will continue to monitor.

## 2019-05-26 NOTE — Progress Notes (Signed)
PROGRESS NOTE  PARAM HURD  [id number] DOB: 1951-06-26 DOA: 05/18/2019 PCP: Antony Contras, MD   Brief Narrative: Colin [name] is a 68 y.o. male with a history of HTN, GERD who presented to the ED on 8/30 after becoming short of breath with hypoxia on home oximetry in the setting of having increasing weakness and positive covid testing earlier in the week. On arrival he was febrile to 101.70F and hypoxic requiring 4L O2 with bilateral CXR infiltrates. He was admitted to [facility], started on steroids and remdesivir. Clinical progress has been slow, though he has not shown evidence of decompensation and inflammatory markers are improving.  Assessment & Plan: Principal Problem:   Pneumonia due to COVID-19 virus Active Problems:   Dyspnea   Hypertension   Hypothyroidism   GERD (gastroesophageal reflux disease)   Hyperlipidemia   Acute respiratory failure with hypoxia (HCC)   Hyponatremia  Acute hypoxic respiratory failure due to covid-19 pneumonia: Anticipate continued protracted recovery. Given evolution of exam and improved oxygenation later in the day, atelectasis is suspected to be contributing. - Completed remdesivir x5 days on 9/4.  - Continue steroids, increased to BID dosing 9/3. Will continue x10 days (8/30 - 9/8).  - If remains on higher oxygen requirement, would perform CT chest.  - WBC normalized, CRP normalized, d-dimer and ferritin declining. PCT remains undetectable.  - IS encouraged, antitussives prn ordered. Pulling 1500cc this AM with subsequent improvement in aeration at bases. Urged to do this more. - Emphasized OOB, increasing mobility as tolerated. PT to continue as able. Home health currently recommended.  Insomnia, anxiety:  - Continue with trazodone  - Can give prn if this is adversely impacting subjective dyspnea.  Hypothyroidism: TSH 0.353. - Continue synthroid   Hyperlipidemia:  - Continue statin  HTN:  - Continue current Tx  Hypovolemic hyponatremia:  Resolved.   DVT prophylaxis: Lovenox Code Status: Full Family Communication: Wife on facetime throughout encounter. Disposition Plan: Anticipate DC home w/HH pending clinical improvement. Given recent gains, could be as soon as 24-48 hours. Will daily ambulate with pulse oximetry to gauge progress.  Consultants:   None  Procedures:   None  Antimicrobials:  Remdesivir 8/31 - 9/4  Subjective: Actually feels improved this morning from previous, had good day yesterday. Feels he's gaining mobility. No fevers or chest pain. No leg swelling. This AM had a reported desaturation event but felt well during that time.   Objective: Vitals:   05/26/19 0342 05/26/19 0600 05/26/19 0700 05/26/19 0723  BP: 101/72   (!) 119/100  Pulse:   61 64  Resp:      Temp: 98.5 F (36.9 C)   97.8 F (36.6 C)  TempSrc: Oral   Oral  SpO2:  (!) 84% (!) 88% 93%  Weight:      Height:        Intake/Output Summary (Last 24 hours) at 05/26/2019 1018 Last data filed at 05/26/2019 0800 Gross per 24 hour  Intake 240 ml  Output 750 ml  Net -510 ml   Filed Weights   05/18/19 2028 05/19/19 0612 05/19/19 1141  Weight: 104.3 kg 113.4 kg 113.4 kg   Gen: Well-appearing male in no distress Pulm: Nonlabored breathing supplemental oxygen. Crackles only at bases, clearing significantly after IS and deep cough. CV: Regular rate and rhythm. No murmur, rub, or gallop. No JVD, no dependent edema. GI: Abdomen soft, non-tender, non-distended, with normoactive bowel sounds.  Ext: Warm, no deformities Skin: No rashes, lesions or ulcers on visualized  skin. Neuro: Alert and oriented. No focal neurological deficits. Psych: Judgement and insight appear fair. Mood euthymic & affect congruent. Behavior is appropriate.    Data Reviewed: I have personally reviewed following labs and imaging studies  CBC: Recent Labs  Lab 05/21/19 0547 05/22/19 0245 05/23/19 0035 05/24/19 0202 05/26/19 0335  WBC 8.7 9.1 9.1 13.0* 10.0   NEUTROABS 7.4 7.9* 7.6 11.3* 8.6*  HGB 13.0 12.7* 13.0 13.9 14.1  HCT 39.3 38.9* 39.0 41.7 42.7  MCV 90.6 91.1 90.3 89.9 90.7  PLT 175 244 261 329 0000000   Basic Metabolic Panel: Recent Labs  Lab 05/21/19 0547 05/22/19 0245 05/23/19 0035 05/24/19 0202 05/26/19 0335  NA 140 138 138 138 136  K 4.4 4.4 4.5 4.6 4.6  CL 109 106 107 105 104  CO2 19* 22 23 21* 22  GLUCOSE 105* 101* 119* 120* 109*  BUN 22 25* 26* 27* 32*  CREATININE 0.79 0.84 0.86 0.89 0.98  CALCIUM 8.5* 8.7* 8.9 9.4 9.2   GFR: Estimated Creatinine Clearance: 96.4 mL/min (by C-G formula based on SCr of 0.98 mg/dL). Liver Function Tests: Recent Labs  Lab 05/21/19 0547 05/22/19 0245 05/23/19 0035 05/24/19 0202 05/26/19 0335  AST 38 39 36 38 24  ALT 28 31 34 42 39  ALKPHOS 51 48 49 55 46  BILITOT 0.5 0.6 0.8 0.5 0.8  PROT 6.6 6.5 6.9 7.2 7.0  ALBUMIN 2.8* 2.8* 2.8* 3.0* 2.9*   Recent Results (from the past 240 hour(s))  SARS Coronavirus 2 [facility] order, Performed in [facility] hospital lab) Nasopharyngeal Nasopharyngeal Swab     Status: Abnormal   Collection Time: 05/18/19 10:40 PM   Specimen: Nasopharyngeal Swab  Result Value Ref Range Status   SARS Coronavirus 2 POSITIVE (A) NEGATIVE Final    Comment: RESULT CALLED TO, READ BACK BY AND VERIFIED WITH: FARRAR,S RN @0212  ON 05/19/2019 JACKSON,K (NOTE) If result is NEGATIVE SARS-CoV-2 target nucleic acids are NOT DETECTED. The SARS-CoV-2 RNA is generally detectable in upper and lower  respiratory specimens during the acute phase of infection. The lowest  concentration of SARS-CoV-2 viral copies this assay can detect is 250  copies / mL. A negative result does not preclude SARS-CoV-2 infection  and should not be used as the sole basis for treatment or other  patient management decisions.  A negative result may occur with  improper specimen collection / handling, submission of specimen other  than nasopharyngeal swab, presence of viral mutation(s) within  the  areas targeted by this assay, and inadequate number of viral copies  (<250 copies / mL). A negative result must be combined with clinical  observations, patient history, and epidemiological information. If result is POSITIVE SARS-CoV-2 target nucleic acids are DETECTE D. The SARS-CoV-2 RNA is generally detectable in upper and lower  respiratory specimens during the acute phase of infection.  Positive  results are indicative of active infection with SARS-CoV-2.  Clinical  correlation with patient history and other diagnostic information is  necessary to determine patient infection status.  Positive results do  not rule out bacterial infection or co-infection with other viruses. If result is PRESUMPTIVE POSTIVE SARS-CoV-2 nucleic acids MAY BE PRESENT.   A presumptive positive result was obtained on the submitted specimen  and confirmed on repeat testing.  While 2019 novel coronavirus  (SARS-CoV-2) nucleic acids may be present in the submitted sample  additional confirmatory testing may be necessary for epidemiological  and / or clinical management purposes  to differentiate between  SARS-CoV-2  and other Sarbecovirus currently known to infect humans.  If clinically indicated additional testing with an alternate test  methodology (LAB745 3) is advised. The SARS-CoV-2 RNA is generally  detectable in upper and lower respiratory specimens during the acute  phase of infection. The expected result is Negative. Fact Sheet for Patients:  StrictlyIdeas.no Fact Sheet for Healthcare Providers: BankingDealers.co.za This test is not yet approved or cleared by the [place] FDA and has been authorized for detection and/or diagnosis of SARS-CoV-2 by FDA under an Emergency Use Authorization (EUA).  This EUA will remain in effect (meaning this test can be used) for the duration of the COVID-19 declaration under Section 564(b)(1) of the Act, 21  U.S.C. section 360bbb-3(b)(1), unless the authorization is terminated or revoked sooner. Performed at [facility], [place] [address] [place], [place] [address]   Blood Culture (routine x 2)     Status: None   Collection Time: 05/18/19 10:40 PM   Specimen: BLOOD  Result Value Ref Range Status   Specimen Description   Final    BLOOD RIGHT ANTECUBITAL Performed at [place] [address], [place], [place] [address]    Special Requests   Final    BOTTLES DRAWN AEROBIC AND ANAEROBIC Blood Culture results may not be optimal due to an excessive volume of blood received in culture bottles Performed at [place] [address] [place], [place] [address]    Culture   Final    NO GROWTH 5 DAYS Performed at [facility], [place] [address], [place], [place] [address]    Report Status 05/24/2019 FINAL  Final  Blood Culture (routine x 2)     Status: None   Collection Time: 05/18/19 10:45 PM   Specimen: BLOOD  Result Value Ref Range Status   Specimen Description   Final    BLOOD LEFT ANTECUBITAL Performed at [place] [address], [place], [place] [address]    Special Requests   Final    BOTTLES DRAWN AEROBIC AND ANAEROBIC Blood Culture results may not be optimal due to an excessive volume of blood received in culture bottles Performed at [place] [address], [place], [place] [address]    Culture   Final    NO GROWTH 5 DAYS Performed at [facility], [place] [address], [place], [place] [address]    Report Status 05/24/2019 FINAL  Final      Radiology Studies: No results found.  Scheduled Meds: . dexamethasone  6 mg Oral Q12H  . enoxaparin (LOVENOX) injection  40 mg Subcutaneous BID  . levothyroxine  75 mcg Oral QAC breakfast  . loratadine  10 mg Oral Daily  . pantoprazole  40 mg Oral Daily  . pravastatin  40 mg Oral q1800   Continuous Infusions:    LOS:  7 days   Time spent: 25 minutes.  Patrecia Pour, MD [facility] [email or web address] Password [email or web address] 05/26/2019, 10:18 AM

## 2019-05-26 NOTE — Progress Notes (Addendum)
Patient standing up at bedside using urinal, multiple times this shift   oxygen saturation  reading low 80's when patient  back in bed in the prone position. oxygen increased 4LNC oxygen sats remained in 86-88%. Patient requiring  6.5 HFNC  oxygen sats  90-92% Will continue to monitor.

## 2019-05-26 NOTE — Progress Notes (Signed)
Occupational Therapy Treatment Patient Details Name: Colin [name] MRN: [id number] DOB: 07-24-1951 Today's Date: 05/26/2019    History of present illness 68 y.o. male past medical history significant for hypertension, GERD osteoarthritis history of skin cancer decreased hearing and depression who tested positive for COVID-19 on [facility] last week was feeling fine but has gradually had progressive weakness and shortness of breath.  He checked his oxygen saturation at home 05/18/19 and was in the 80s so he came into the ED.  +pna, hypoxia   OT comments  Pt making good progress towards OT goals, very motivated to participated this session. Pt completing functional mobility both in room (multiple laps) and hallway with seated rest break between each bout of activity (before and after mobility into hallway). Pt requiring close minguard assist for mobility especially with prolonged distance as he becomes mildly unsteady, suspect mostly due to fatigue. Pt performing toileting and bathing ADL while seated on BSC at sink in bathroom, overall with supervision assist; pt initiating rest breaks appropriately throughout. Pt initially on 4L start of session and during hallway mobility; trialled on RA and then 2L with completing laps in room, pt requiring 2L to maintain sats >90%, sats decreasing to 86% with return to sitting, though quick to return to >90% with extended seated rest on 2L. Pt reports improvements in tolerance and breathing with activity today. Will continue per POC.    Follow Up Recommendations  No OT follow up;Supervision - Intermittent    Equipment Recommendations  3 in 1 bedside commode          Precautions / Restrictions Precautions Precautions: Other (comment) Precaution Comments: monitor sats Restrictions Weight Bearing Restrictions: No       Mobility Bed Mobility               General bed mobility comments: OOB in recliner at beginning and end of session  Transfers Overall  transfer level: Needs assistance Equipment used: None Transfers: Sit to/from Stand Sit to Stand: Supervision         General transfer comment: supervision for safety    Balance Overall balance assessment: Needs assistance Sitting-balance support: Feet supported;No upper extremity supported Sitting balance-Leahy Scale: Good     Standing balance support: No upper extremity supported Standing balance-Leahy Scale: Fair                             ADL either performed or assessed with clinical judgement   ADL Overall ADL's : Needs assistance/impaired Eating/Feeding: Independent Eating/Feeding Details (indicate cue type and reason): lunch tray arriving end of session Grooming: Wash/dry face;Modified independent;Sitting   Upper Body Bathing: Supervision/ safety;Sitting   Lower Body Bathing: Supervison/ safety;Sit to/from stand Lower Body Bathing Details (indicate cue type and reason): performing from sit<>stand level with BSC placed in front of sink in bathroom Upper Body Dressing : Set up;Sitting Upper Body Dressing Details (indicate cue type and reason): donning new gown     Toilet Transfer: Min guard;Ambulation Toilet Transfer Details (indicate cue type and reason): into bathroom Toileting- Clothing Manipulation and Hygiene: Supervision/safety;Sit to/from stand Toileting - Clothing Manipulation Details (indicate cue type and reason): use of grab bar for stability; performing clothing management (underwear) without difficulty      Functional mobility during ADLs: Min guard General ADL Comments: pt with increased unsteadiness with mobility into hallway (suspect with increased fatigue), very motivated this session  Cognition Arousal/Alertness: Awake/alert Behavior During Therapy: WFL for tasks assessed/performed Overall Cognitive Status: Within Functional Limits for tasks assessed                                  General Comments: less anxious today, very pleasant and motivated        Exercises     Shoulder Instructions       General Comments      Pertinent Vitals/ Pain       Pain Assessment: No/denies pain  Home Living                                          Prior Functioning/Environment              Frequency  Min 2X/week        Progress Toward Goals  OT Goals(current goals can now be found in the care plan section)  Progress towards OT goals: Progressing toward goals  Acute Rehab OT Goals Patient Stated Goal: feel stronger before going home OT Goal Formulation: With patient Time For Goal Achievement: 06/05/19 Potential to Achieve Goals: Good ADL Goals Pt Will Perform Grooming: with modified independence;standing Pt Will Perform Upper Body Dressing: with modified independence;sitting Pt Will Perform Lower Body Dressing: with modified independence;sit to/from stand Pt Will Transfer to Toilet: with modified independence;ambulating Pt Will Perform Toileting - Clothing Manipulation and hygiene: with modified independence;sit to/from stand Pt/caregiver will Perform Home Exercise Program: Both right and left upper extremity;With theraband;Independently;With written HEP provided Additional ADL Goal #1: Pt will recall at least 3 energy conservation strategies for ADL with only one prompt  Plan Discharge plan remains appropriate    Co-evaluation                 AM-PAC OT "6 Clicks" Daily Activity     Outcome Measure   Help from another person eating meals?: None Help from another person taking care of personal grooming?: A Little Help from another person toileting, which includes using toliet, bedpan, or urinal?: A Little Help from another person bathing (including washing, rinsing, drying)?: A Little Help from another person to put on and taking off regular upper body clothing?: None Help from another person to put on and taking off regular  lower body clothing?: A Little 6 Click Score: 20    End of Session Equipment Utilized During Treatment: Oxygen(2-4L)  OT Visit Diagnosis: Other abnormalities of gait and mobility (R26.89);Muscle weakness (generalized) (M62.81)   Activity Tolerance Patient tolerated treatment well   Patient Left in chair;with call bell/phone within reach   Nurse Communication Mobility status        Time: [id number] OT Time Calculation (min): 59 min  Charges: OT General Charges $OT Visit: 1 Visit OT Treatments $Self Care/Home Management : 38-52 mins $Therapeutic Activity: 8-22 mins  Lou Cal, OT Supplemental Rehabilitation Services Pager [phone] Office [phone]    Raymondo Band 05/26/2019, 2:30 PM

## 2019-05-27 DIAGNOSIS — K219 Gastro-esophageal reflux disease without esophagitis: Secondary | ICD-10-CM

## 2019-05-27 LAB — COMPREHENSIVE METABOLIC PANEL
ALT: 37 U/L (ref 0–44)
AST: 22 U/L (ref 15–41)
Albumin: 2.7 g/dL — ABNORMAL LOW (ref 3.5–5.0)
Alkaline Phosphatase: 45 U/L (ref 38–126)
Anion gap: 9 (ref 5–15)
BUN: 33 mg/dL — ABNORMAL HIGH (ref 8–23)
CO2: 23 mmol/L (ref 22–32)
Calcium: 8.9 mg/dL (ref 8.9–10.3)
Chloride: 103 mmol/L (ref 98–111)
Creatinine, Ser: 0.9 mg/dL (ref 0.61–1.24)
GFR calc Af Amer: >60 mL/min (ref >60)
GFR calc non Af Amer: >60 mL/min (ref >60)
Glucose, Bld: 111 mg/dL — ABNORMAL HIGH (ref 70–99)
Potassium: 4.8 mmol/L (ref 3.5–5.1)
Sodium: 135 mmol/L (ref 135–145)
Total Bilirubin: 0.8 mg/dL (ref 0.3–1.2)
Total Protein: 6.6 g/dL (ref 6.5–8.1)

## 2019-05-27 LAB — CBC WITH DIFFERENTIAL/PLATELET
Abs Immature Granulocytes: 0.33 10*3/uL — ABNORMAL HIGH (ref 0.00–0.07)
Basophils Absolute: 0 10*3/uL (ref 0.0–0.1)
Basophils Relative: 0 %
Eosinophils Absolute: 0 10*3/uL (ref 0.0–0.5)
Eosinophils Relative: 0 %
HCT: 42.8 % (ref 39.0–52.0)
Hemoglobin: 14 g/dL (ref 13.0–17.0)
Immature Granulocytes: 4 %
Lymphocytes Relative: 6 %
Lymphs Abs: 0.5 10*3/uL — ABNORMAL LOW (ref 0.7–4.0)
MCH: 29.6 pg (ref 26.0–34.0)
MCHC: 32.7 g/dL (ref 30.0–36.0)
MCV: 90.5 fL (ref 80.0–100.0)
Monocytes Absolute: 0.5 10*3/uL (ref 0.1–1.0)
Monocytes Relative: 5 %
Neutro Abs: 8 10*3/uL — ABNORMAL HIGH (ref 1.7–7.7)
Neutrophils Relative %: 85 %
Platelets: 258 10*3/uL (ref 150–400)
RBC: 4.73 MIL/uL (ref 4.22–5.81)
RDW: 13.7 % (ref 11.5–15.5)
WBC: 9.4 10*3/uL (ref 4.0–10.5)
nRBC: 0 % (ref 0.0–0.2)

## 2019-05-27 LAB — FERRITIN: Ferritin: 358 ng/mL — ABNORMAL HIGH (ref 24–336)

## 2019-05-27 LAB — D-DIMER, QUANTITATIVE: D-Dimer, Quant: 0.63 ug/mL-FEU — ABNORMAL HIGH (ref 0.00–0.50)

## 2019-05-27 NOTE — Progress Notes (Signed)
Physical Therapy Treatment Patient Details Name: Colin [name] MRN: [id number] DOB: 1951/04/19 Today's Date: 05/27/2019    History of Present Illness 68 y.o. male past medical history significant for hypertension, GERD osteoarthritis history of skin cancer decreased hearing and depression who tested positive for [facility] last week was feeling fine but has gradually had progressive weakness and shortness of breath.  He checked his oxygen saturation at home 05/18/19 and was in the 80s so he came into the ED.  +pna, hypoxia   PT Comments    Pt progressing with mobility. SpO2 down to 82% on 2L O2 [place] with ambulation, returning to >88% on 4L O2 with prolonged seated rest break. ~5 min seated rest and pursed lip breathing, pt maintaining SpO2 >90% on RA. Pt motivated for d/c home today. Reviewed educ re: quarantine recommendations, energy conservation strategies, return to activity/aerobic exercise. Pt reports owning pulse ox and aware of supplemental O2 needs. Will have support from family.    Follow Up Recommendations  Home health PT;Supervision for mobility/OOB     Equipment Recommendations  (home O2)    Recommendations for Other Services       Precautions / Restrictions Precautions Precautions: Fall;Other (comment) Precaution Comments: monitor sats Restrictions Weight Bearing Restrictions: No    Mobility  Bed Mobility               General bed mobility comments: OOB in recliner at beginning and end of session  Transfers Overall transfer level: Independent Equipment used: None             General transfer comment: Independent to stand and don pants; required prolonged seated rest break after donning shirt/pants due to DOE  Ambulation/Gait Ambulation/Gait assistance: Supervision Gait Distance (Feet): 88 Feet Assistive device: None Gait Pattern/deviations: Step-through pattern;Decreased stride length;Drifts right/left Gait velocity: Decreased Gait velocity  interpretation: <1.8 ft/sec, indicate of risk for recurrent falls General Gait Details: Slow, mildly unsteady gait while pushing IV pole, supervision for safety. 1x standing rest break holding rail; pt reports "I need to turn back" due to fatigue and SOB. SpO2 down to 82% on 2L O2 [place]   Stairs             Wheelchair Mobility    Modified Rankin (Stroke Patients Only)       Balance Overall balance assessment: Needs assistance Sitting-balance support: Feet supported;No upper extremity supported Sitting balance-Leahy Scale: Good       Standing balance-Leahy Scale: Fair                              Cognition Arousal/Alertness: Awake/alert Behavior During Therapy: WFL for tasks assessed/performed Overall Cognitive Status: Within Functional Limits for tasks assessed                                        Exercises      General Comments        Pertinent Vitals/Pain Pain Assessment: No/denies pain    Home Living                      Prior Function            PT Goals (current goals can now be found in the care plan section) Acute Rehab PT Goals Patient Stated Goal: Home today PT Goal Formulation: With patient Time  For Goal Achievement: 06/04/19 Potential to Achieve Goals: Good Progress towards PT goals: Progressing toward goals    Frequency    Min 3X/week      PT Plan Current plan remains appropriate    Co-evaluation              AM-PAC PT "6 Clicks" Mobility   Outcome Measure  Help needed turning from your back to your side while in a flat bed without using bedrails?: None Help needed moving from lying on your back to sitting on the side of a flat bed without using bedrails?: None Help needed moving to and from a bed to a chair (including a wheelchair)?: None Help needed standing up from a chair using your arms (e.g., wheelchair or bedside chair)?: None Help needed to walk in hospital room?: None Help  needed climbing 3-5 steps with a railing? : A Little 6 Click Score: 23    End of Session Equipment Utilized During Treatment: Oxygen Activity Tolerance: Patient tolerated treatment well;Patient limited by fatigue Patient left: in chair;with call bell/phone within reach Nurse Communication: Mobility status PT Visit Diagnosis: Muscle weakness (generalized) (M62.81);Difficulty in walking, not elsewhere classified (R26.2)     Time: [id number] PT Time Calculation (min) (ACUTE ONLY): 27 min  Charges:  $Gait Training: 8-22 mins $Therapeutic Activity: 8-22 mins                    Mabeline Caras, PT, DPT Acute Rehabilitation Services  Pager [phone] Office [place] 05/27/2019, 9:12 AM

## 2019-05-27 NOTE — TOC Transition Note (Signed)
Transition of Care [facility]) - CM/SW Discharge Note   Patient Details  Name: Colin [name] MRN: [id number] Date of Birth: [birth year]  Transition of [facility]) CM/SW Contact:  Weston Anna, LCSW Phone Number: 05/27/2019, 8:35 AM   Clinical Narrative:     Patient set to discharge home with Home Health and DME needs (o2). Home Health has been arranged with [facility] and O2 has been arranged with [employer]. All orders placed by MD.   No other needs at this time.     Barriers to Discharge: Continued Medical Work up   Patient Goals and CMS Choice     Choice offered to / list presented to : Spouse  Discharge Placement                       Discharge Plan and Services In-house Referral: Clinical Social Work   Post Acute Care Choice: Home Health, Durable Medical Equipment          DME Arranged: N/A         HH Arranged: PT, OT [place] Agency: [facility] [facility] [employer] Date [place]: 05/22/19 Time [place]: 1000 Representative spoke with at [place]: [place] ([place]) Interventions     Readmission Risk Interventions No flowsheet data found.

## 2019-05-27 NOTE — Discharge Summary (Signed)
Physician Discharge Summary  Colin [name] [id number] DOB: 06-12-1951 DOA: 05/18/2019  PCP: Antony Contras, MD  Admit date: 05/18/2019 Discharge date: 05/27/2019  Admitted From: Home Disposition: Home   Recommendations for Outpatient Follow-up:  1. Follow up with PCP in 1-2 weeks 2. Please obtain CMP/CBC in one week  Home Health: PT Equipment/Devices: 2L O2 Discharge Condition: Stable CODE STATUS: Full Diet recommendation: Heart healthy  Brief/Interim Summary: Colin [name] is a 68 y.o. male with a history of HTN, GERD who presented to the ED on 8/30 after becoming short of breath with hypoxia on home oximetry in the setting of having increasing weakness and positive covid testing earlier in the week. On arrival he was febrile to 101.25F and hypoxic requiring 4L O2 with bilateral CXR infiltrates. He was admitted to [facility], started on steroids and remdesivir. Clinical progress has been slow, though he has not shown evidence of decompensation and inflammatory markers improved with CRP normalizing 9/7. He continues to show improvement and has maximized benefit of inpatient management.   Discharge Diagnoses:  Principal Problem:   Pneumonia due to COVID-19 virus Active Problems:   Dyspnea   Hypertension   Hypothyroidism   GERD (gastroesophageal reflux disease)   Hyperlipidemia   Acute respiratory failure with hypoxia (HCC)   Hyponatremia  Acute hypoxic respiratory failure due to covid-19 pneumonia: Anticipate continued protracted recovery. Given evolution of exam and improved oxygenation later in the day, atelectasis is suspected to be contributing. - Completed remdesivir x5 days on 9/4.  - Completed 10 days of steroids (8/30 - 9/8).   - WBC normalized, CRP normalized, d-dimer and ferritin declining. PCT remains undetectable.  - DC with supplemental oxygen with expected recovery over the course of days-weeks.  Insomnia, anxiety:  - Given trazodone qHS while admitted.    Hypothyroidism: TSH 0.353. - Continue synthroid   Hyperlipidemia:  - Continue statin  HTN:  - Continue current Tx  Hypovolemic hyponatremia: Resolved.   GERD:  - Continue PPI  Discharge Instructions Discharge Instructions    Diet - low sodium heart healthy   Complete by: As directed    Discharge instructions   Complete by: As directed    You are being discharged from the hospital after treatment for covid-19 infection. You are felt to be stable enough to no longer require inpatient monitoring, testing, and treatment, though you will need to follow the recommendations below: - You have completed a full course of remdesivir and steroids. - Remain in self-isolation for 14 days following discharge to reduce risk of transmission of the virus.  - Do not take NSAID medications (including, but not limited to, ibuprofen, advil, motrin, naproxen, aleve, goody's powder, etc.) - Follow up with your doctor in the next week via telehealth or seek medical attention right away if your symptoms get WORSE.  - Consider donating plasma after you have recovered (either 14 days after a negative test or 28 days after symptoms have completely resolved) because your antibodies to this virus may be helpful to give to others with life-threatening infections. Please go to the website www.[email or web address] if you would like to consider volunteering for plasma donation.    Directions for you at home:  Wear a facemask You should wear a facemask that covers your nose and mouth when you are in the same room with other people and when you visit a healthcare provider. People who live with or visit you should also wear a facemask while they are in the same room with you.  Separate yourself from other people in your home As much as possible, you should stay in a different room from other people in your home. Also, you should use a separate bathroom, if available.  Avoid sharing household items You should not  share dishes, drinking glasses, cups, eating utensils, towels, bedding, or other items with other people in your home. After using these items, you should wash them thoroughly with soap and water.  Cover your coughs and sneezes Cover your mouth and nose with a tissue when you cough or sneeze, or you can cough or sneeze into your sleeve. Throw used tissues in a lined trash can, and immediately wash your hands with soap and water for at least 20 seconds or use an alcohol-based hand rub.  Wash your [facility] your hands often and thoroughly with soap and water for at least 20 seconds. You can use an alcohol-based hand sanitizer if soap and water are not available and if your hands are not visibly dirty. Avoid touching your eyes, nose, and mouth with unwashed hands.  Directions for those who live with, or provide care at home for you:  Limit the number of people who have contact with the patient If possible, have only one caregiver for the patient. Other household members should stay in another home or place of residence. If this is not possible, they should stay in another room, or be separated from the patient as much as possible. Use a separate bathroom, if available. Restrict visitors who do not have an essential need to be in the home.  Ensure good ventilation Make sure that shared spaces in the home have good air flow, such as from an air conditioner or an opened window, weather permitting.  Wash your hands often Wash your hands often and thoroughly with soap and water for at least 20 seconds. You can use an alcohol based hand sanitizer if soap and water are not available and if your hands are not visibly dirty. Avoid touching your eyes, nose, and mouth with unwashed hands. Use disposable paper towels to dry your hands. If not available, use dedicated cloth towels and replace them when they become wet.  Wear a facemask and gloves Wear a disposable facemask at all times in the room  and gloves when you touch or have contact with the patient's blood, body fluids, and/or secretions or excretions, such as sweat, saliva, sputum, nasal mucus, vomit, urine, or feces.  Ensure the mask fits over your nose and mouth tightly, and do not touch it during use. Throw out disposable facemasks and gloves after using them. Do not reuse. Wash your hands immediately after removing your facemask and gloves. If your personal clothing becomes contaminated, carefully remove clothing and launder. Wash your hands after handling contaminated clothing. Place all used disposable facemasks, gloves, and other waste in a lined container before disposing them with other household waste. Remove gloves and wash your hands immediately after handling these items.  Do not share dishes, glasses, or other household items with the patient Avoid sharing household items. You should not share dishes, drinking glasses, cups, eating utensils, towels, bedding, or other items with a patient who is confirmed to have, or being evaluated for, COVID-19 infection. After the person uses these items, you should wash them thoroughly with soap and water.  Wash laundry thoroughly Immediately remove and wash clothes or bedding that have blood, body fluids, and/or secretions or excretions, such as sweat, saliva, sputum, nasal mucus, vomit, urine, or feces, on  them. Wear gloves when handling laundry from the patient. Read and follow directions on labels of laundry or clothing items and detergent. In general, wash and dry with the warmest temperatures recommended on the label.  Clean all areas the individual has used often Clean all touchable surfaces, such as counters, tabletops, doorknobs, bathroom fixtures, toilets, phones, keyboards, tablets, and bedside tables, every day. Also, clean any surfaces that may have blood, body fluids, and/or secretions or excretions on them. Wear gloves when cleaning surfaces the patient has come in  contact with. Use a diluted bleach solution (e.g., dilute bleach with 1 part bleach and 10 parts water) or a household disinfectant with a label that says EPA-registered for coronaviruses. To make a bleach solution at home, add 1 tablespoon of bleach to 1 quart (4 cups) of water. For a larger supply, add  cup of bleach to 1 gallon (16 cups) of water. Read labels of cleaning products and follow recommendations provided on product labels. Labels contain instructions for safe and effective use of the cleaning product including precautions you should take when applying the product, such as wearing gloves or eye protection and making sure you have good ventilation during use of the product. Remove gloves and wash hands immediately after cleaning.  Monitor yourself for signs and symptoms of illness Caregivers and household members are considered close contacts, should monitor their health, and will be asked to limit movement outside of the home to the extent possible. Follow the monitoring steps for close contacts listed on the symptom monitoring form.  If you have additional questions, contact your local health department or call the epidemiologist on call at [phone] (available 24/7). This guidance is subject to change. For the most up-to-date guidance from [facility], please refer to their website: [email or web address].pl   Increase activity slowly   Complete by: As directed    MyChart COVID-19 home monitoring program   Complete by: May 27, 2019    Is the patient willing to use the University for home monitoring?: Yes   Temperature monitoring   Complete by: May 27, 2019    After how many days would you like to receive a notification of this patient's flowsheet entries?: 1     Allergies as of 05/27/2019   No Known Allergies     Medication List    TAKE these medications   cetirizine 10 MG tablet Commonly known as: ZYRTEC Take 10 mg by  mouth daily as needed for allergies.   levothyroxine 75 MCG tablet Commonly known as: SYNTHROID Take 75 mcg by mouth daily before breakfast.   losartan-hydrochlorothiazide 50-12.5 MG tablet Commonly known as: HYZAAR Take 1 tablet by mouth daily.   lovastatin 40 MG tablet Commonly known as: MEVACOR Take 40 mg by mouth every morning.   omeprazole 40 MG capsule Commonly known as: PRILOSEC Take 40 mg by mouth daily.            Durable Medical Equipment  (From admission, onward)         Start     Ordered   05/27/19 [phone]  For home use only DME oxygen  Once    Question Answer Comment  Length of Need 6 Months   Mode or (Route) Nasal cannula   Liters per Minute 2   Frequency Continuous (stationary and portable oxygen unit needed)   Oxygen delivery system Gas      05/27/19 0651         Follow-up Information    Antony Contras,  MD. Schedule an appointment as soon as possible for a visit in 1 week(s).   Specialty: Family Medicine Contact information: [address] [place] [address] [phone]          No Known Allergies  Consultations:  None  Procedures/Studies: Dg Chest Port 1 View  Result Date: 05/19/2019 CLINICAL DATA:  Initial evaluation for acute hypoxia, fatigue, recently diagnosed with COVID. EXAM: PORTABLE CHEST 1 VIEW COMPARISON:  Prior radiograph from 08/14/2012. FINDINGS: Cardiomegaly, stable. Mediastinal silhouette within normal limits. Aortic atherosclerosis. Lungs hypoinflated. Patchy and hazy bilateral airspace disease, which could reflect edema and/or infiltrates. No obvious pleural effusion. No pneumothorax. No acute osseous finding. Cervical ACDF noted. IMPRESSION: 1. Patchy and hazy bilateral airspace disease, which could reflect edema and/or infiltrates. 2. Cardiomegaly with aortic atherosclerosis. Electronically Signed   By: Jeannine Boga M.D.   On: 05/19/2019 00:06    Subjective: Feels well, liberated from oxygen  this morning at rest, but SpO2 90%, no SOB currently. Denies chest pain. Ambulated w/therapy the previous 2 days, gaining strength but still SOB with activity. No fevers.   Discharge Exam: Vitals:   05/27/19 0345 05/27/19 0736  BP: 101/71 127/79  Pulse: (!) 49 (!) 55  Resp:  18  Temp: 97.8 F (36.6 C) 98.1 F (36.7 C)  SpO2: 93% 93%   General: Pt is alert, awake, not in acute distress Cardiovascular: Regular bradycardia, S1/S2 +, no rubs, no gallops Respiratory: CTA bilaterally, no wheezing, no rhonchi Abdominal: Soft, NT, ND, bowel sounds + Extremities: No edema, no cyanosis  Labs: Basic Metabolic Panel: Recent Labs  Lab 05/22/19 0245 05/23/19 0035 05/24/19 0202 05/26/19 0335 05/27/19 0155  NA 138 138 138 136 135  K 4.4 4.5 4.6 4.6 4.8  CL 106 107 105 104 103  CO2 22 23 21* 22 23  GLUCOSE 101* 119* 120* 109* 111*  BUN 25* 26* 27* 32* 33*  CREATININE 0.84 0.86 0.89 0.98 0.90  CALCIUM 8.7* 8.9 9.4 9.2 8.9   Liver Function Tests: Recent Labs  Lab 05/22/19 0245 05/23/19 0035 05/24/19 0202 05/26/19 0335 05/27/19 0155  AST 39 36 38 24 22  ALT 31 34 42 39 37  ALKPHOS 48 49 55 46 45  BILITOT 0.6 0.8 0.5 0.8 0.8  PROT 6.5 6.9 7.2 7.0 6.6  ALBUMIN 2.8* 2.8* 3.0* 2.9* 2.7*   No results for input(s): LIPASE, AMYLASE in the last 168 hours. No results for input(s): AMMONIA in the last 168 hours. CBC: Recent Labs  Lab 05/22/19 0245 05/23/19 0035 05/24/19 0202 05/26/19 0335 05/27/19 0155  WBC 9.1 9.1 13.0* 10.0 9.4  NEUTROABS 7.9* 7.6 11.3* 8.6* 8.0*  HGB 12.7* 13.0 13.9 14.1 14.0  HCT 38.9* 39.0 41.7 42.7 42.8  MCV 91.1 90.3 89.9 90.7 90.5  PLT 244 261 329 258 258   Cardiac Enzymes: No results for input(s): CKTOTAL, CKMB, CKMBINDEX, TROPONINI in the last 168 hours. BNP: Invalid input(s): POCBNP CBG: No results for input(s): GLUCAP in the last 168 hours. D-Dimer Recent Labs    05/26/19 0335 05/27/19 0155  DDIMER 0.73* 0.63*   Hgb A1c No results for  input(s): HGBA1C in the last 72 hours. Lipid Profile No results for input(s): CHOL, HDL, LDLCALC, TRIG, CHOLHDL, LDLDIRECT in the last 72 hours. Thyroid function studies No results for input(s): TSH, T4TOTAL, T3FREE, THYROIDAB in the last 72 hours.  Invalid input(s): FREET3 Anemia work up Recent Labs    05/26/19 0335 05/27/19 0155  FERRITIN 362* 358*  Urinalysis    Component Value Date/Time   COLORURINE YELLOW 04/05/2016 [place] 04/05/2016 0544   LABSPEC 1.016 04/05/2016 0544   PHURINE 5.5 04/05/2016 0544   GLUCOSEU NEGATIVE 04/05/2016 0544   HGBUR NEGATIVE 04/05/2016 0544   BILIRUBINUR NEGATIVE 04/05/2016 0544   KETONESUR NEGATIVE 04/05/2016 0544   PROTEINUR NEGATIVE 04/05/2016 0544   UROBILINOGEN 0.2 10/22/2009 1230   NITRITE NEGATIVE 04/05/2016 0544   LEUKOCYTESUR NEGATIVE 04/05/2016 0544    Microbiology Recent Results (from the past 240 hour(s))  SARS Coronavirus 2 [facility] order, Performed in [facility] hospital lab) Nasopharyngeal Nasopharyngeal Swab     Status: Abnormal   Collection Time: 05/18/19 10:40 PM   Specimen: Nasopharyngeal Swab  Result Value Ref Range Status   SARS Coronavirus 2 POSITIVE (A) NEGATIVE Final    Comment: RESULT CALLED TO, READ BACK BY AND VERIFIED WITH: FARRAR,S RN @0212  ON 05/19/2019 JACKSON,K (NOTE) If result is NEGATIVE SARS-CoV-2 target nucleic acids are NOT DETECTED. The SARS-CoV-2 RNA is generally detectable in upper and lower  respiratory specimens during the acute phase of infection. The lowest  concentration of SARS-CoV-2 viral copies this assay can detect is 250  copies / mL. A negative result does not preclude SARS-CoV-2 infection  and should not be used as the sole basis for treatment or other  patient management decisions.  A negative result may occur with  improper specimen collection / handling, submission of specimen other  than nasopharyngeal swab, presence of viral mutation(s) within the  areas  targeted by this assay, and inadequate number of viral copies  (<250 copies / mL). A negative result must be combined with clinical  observations, patient history, and epidemiological information. If result is POSITIVE SARS-CoV-2 target nucleic acids are DETECTE D. The SARS-CoV-2 RNA is generally detectable in upper and lower  respiratory specimens during the acute phase of infection.  Positive  results are indicative of active infection with SARS-CoV-2.  Clinical  correlation with patient history and other diagnostic information is  necessary to determine patient infection status.  Positive results do  not rule out bacterial infection or co-infection with other viruses. If result is PRESUMPTIVE POSTIVE SARS-CoV-2 nucleic acids MAY BE PRESENT.   A presumptive positive result was obtained on the submitted specimen  and confirmed on repeat testing.  While 2019 novel coronavirus  (SARS-CoV-2) nucleic acids may be present in the submitted sample  additional confirmatory testing may be necessary for epidemiological  and / or clinical management purposes  to differentiate between  SARS-CoV-2 and other Sarbecovirus currently known to infect humans.  If clinically indicated additional testing with an alternate test  methodology (LAB745 3) is advised. The SARS-CoV-2 RNA is generally  detectable in upper and lower respiratory specimens during the acute  phase of infection. The expected result is Negative. Fact Sheet for Patients:  StrictlyIdeas.no Fact Sheet for Healthcare Providers: BankingDealers.co.za This test is not yet approved or cleared by the [place] FDA and has been authorized for detection and/or diagnosis of SARS-CoV-2 by FDA under an Emergency Use Authorization (EUA).  This EUA will remain in effect (meaning this test can be used) for the duration of the COVID-19 declaration under Section 564(b)(1) of the Act, 21 U.S.C. section  360bbb-3(b)(1), unless the authorization is terminated or revoked sooner. Performed at [facility], [place] [address] [place], [place] [address]   Blood Culture (routine x 2)     Status: None   Collection Time: 05/18/19 10:40 PM  Specimen: BLOOD  Result Value Ref Range Status   Specimen Description   Final    BLOOD RIGHT ANTECUBITAL Performed at [place] [address] [place], [place] [address]    Special Requests   Final    BOTTLES DRAWN AEROBIC AND ANAEROBIC Blood Culture results may not be optimal due to an excessive volume of blood received in culture bottles Performed at [place] [address] [place], [place] [address]    Culture   Final    NO GROWTH 5 DAYS Performed at [facility], [place] [address] [place], [place] [address]    Report Status 05/24/2019 FINAL  Final  Blood Culture (routine x 2)     Status: None   Collection Time: 05/18/19 10:45 PM   Specimen: BLOOD  Result Value Ref Range Status   Specimen Description   Final    BLOOD LEFT ANTECUBITAL Performed at [place] [address] [place], [place] [address]    Special Requests   Final    BOTTLES DRAWN AEROBIC AND ANAEROBIC Blood Culture results may not be optimal due to an excessive volume of blood received in culture bottles Performed at [place] [address] [place], [place] [address]    Culture   Final    NO GROWTH 5 DAYS Performed at [facility], [place] [address], [place], [place] [address]    Report Status 05/24/2019 FINAL  Final    Time coordinating discharge: Approximately 40 minutes  Patrecia Pour, MD  [facility] 05/27/2019, 9:36 AM

## 2019-05-27 NOTE — Discharge Instructions (Signed)

## 2019-05-27 NOTE — Plan of Care (Signed)
Written and verbal discharge orders given to patient.  Patient verbalized understanding of orders.  Patient discharged to home via private vehicle.  Problem: Education: Goal: Knowledge of risk factors and measures for prevention of condition will improve Outcome: Adequate for Discharge   Problem: Coping: Goal: Psychosocial and spiritual needs will be supported Outcome: Adequate for Discharge   Problem: Respiratory: Goal: Will maintain a patent airway Outcome: Adequate for Discharge Goal: Complications related to the disease process, condition or treatment will be avoided or minimized Outcome: Adequate for Discharge   Problem: Education: Goal: Knowledge of General Education information will improve Description: Including pain rating scale, medication(s)/side effects and non-pharmacologic comfort measures Outcome: Adequate for Discharge   Problem: Health Behavior/Discharge Planning: Goal: Ability to manage health-related needs will improve Outcome: Adequate for Discharge   Problem: Clinical Measurements: Goal: Ability to maintain clinical measurements within normal limits will improve Outcome: Adequate for Discharge Goal: Will remain free from infection Outcome: Adequate for Discharge Goal: Diagnostic test results will improve Outcome: Adequate for Discharge Goal: Respiratory complications will improve Outcome: Adequate for Discharge Goal: Cardiovascular complication will be avoided Outcome: Adequate for Discharge   Problem: Activity: Goal: Risk for activity intolerance will decrease Outcome: Adequate for Discharge   Problem: Nutrition: Goal: Adequate nutrition will be maintained Outcome: Adequate for Discharge   Problem: Coping: Goal: Level of anxiety will decrease Outcome: Adequate for Discharge   Problem: Elimination: Goal: Will not experience complications related to bowel motility Outcome: Adequate for Discharge Goal: Will not experience complications related to  urinary retention Outcome: Adequate for Discharge   Problem: Pain Managment: Goal: General experience of comfort will improve Outcome: Adequate for Discharge   Problem: Safety: Goal: Ability to remain free from injury will improve Outcome: Adequate for Discharge   Problem: Skin Integrity: Goal: Risk for impaired skin integrity will decrease Outcome: Adequate for Discharge

## 2019-05-28 DIAGNOSIS — U071 COVID-19: Secondary | ICD-10-CM | POA: Diagnosis not present

## 2019-06-02 DIAGNOSIS — U071 COVID-19: Secondary | ICD-10-CM | POA: Diagnosis not present

## 2019-06-02 DIAGNOSIS — Z9981 Dependence on supplemental oxygen: Secondary | ICD-10-CM | POA: Diagnosis not present

## 2019-06-02 DIAGNOSIS — I10 Essential (primary) hypertension: Secondary | ICD-10-CM | POA: Diagnosis not present

## 2019-06-02 DIAGNOSIS — J9601 Acute respiratory failure with hypoxia: Secondary | ICD-10-CM | POA: Diagnosis not present

## 2019-06-02 DIAGNOSIS — E785 Hyperlipidemia, unspecified: Secondary | ICD-10-CM | POA: Diagnosis not present

## 2019-06-02 DIAGNOSIS — F419 Anxiety disorder, unspecified: Secondary | ICD-10-CM | POA: Diagnosis not present

## 2019-06-02 DIAGNOSIS — E039 Hypothyroidism, unspecified: Secondary | ICD-10-CM | POA: Diagnosis not present

## 2019-06-02 DIAGNOSIS — R252 Cramp and spasm: Secondary | ICD-10-CM | POA: Diagnosis not present

## 2019-06-02 DIAGNOSIS — G4700 Insomnia, unspecified: Secondary | ICD-10-CM | POA: Diagnosis not present

## 2019-06-02 DIAGNOSIS — Z85828 Personal history of other malignant neoplasm of skin: Secondary | ICD-10-CM | POA: Diagnosis not present

## 2019-06-02 DIAGNOSIS — Z8619 Personal history of other infectious and parasitic diseases: Secondary | ICD-10-CM | POA: Diagnosis not present

## 2019-06-02 DIAGNOSIS — F329 Major depressive disorder, single episode, unspecified: Secondary | ICD-10-CM | POA: Diagnosis not present

## 2019-06-02 DIAGNOSIS — R5381 Other malaise: Secondary | ICD-10-CM | POA: Diagnosis not present

## 2019-06-02 DIAGNOSIS — R0902 Hypoxemia: Secondary | ICD-10-CM | POA: Diagnosis not present

## 2019-06-02 DIAGNOSIS — E782 Mixed hyperlipidemia: Secondary | ICD-10-CM | POA: Diagnosis not present

## 2019-06-02 DIAGNOSIS — K219 Gastro-esophageal reflux disease without esophagitis: Secondary | ICD-10-CM | POA: Diagnosis not present

## 2019-06-02 DIAGNOSIS — J1289 Other viral pneumonia: Secondary | ICD-10-CM | POA: Diagnosis not present

## 2019-06-02 DIAGNOSIS — M1991 Primary osteoarthritis, unspecified site: Secondary | ICD-10-CM | POA: Diagnosis not present

## 2019-06-13 DIAGNOSIS — I10 Essential (primary) hypertension: Secondary | ICD-10-CM | POA: Diagnosis not present

## 2019-06-13 DIAGNOSIS — J9601 Acute respiratory failure with hypoxia: Secondary | ICD-10-CM | POA: Diagnosis not present

## 2019-06-13 DIAGNOSIS — U071 COVID-19: Secondary | ICD-10-CM | POA: Diagnosis not present

## 2019-06-13 DIAGNOSIS — J1289 Other viral pneumonia: Secondary | ICD-10-CM | POA: Diagnosis not present

## 2019-06-13 DIAGNOSIS — E039 Hypothyroidism, unspecified: Secondary | ICD-10-CM | POA: Diagnosis not present

## 2019-06-13 DIAGNOSIS — K219 Gastro-esophageal reflux disease without esophagitis: Secondary | ICD-10-CM | POA: Diagnosis not present

## 2019-06-17 DIAGNOSIS — U071 COVID-19: Secondary | ICD-10-CM | POA: Diagnosis not present

## 2019-06-17 DIAGNOSIS — K219 Gastro-esophageal reflux disease without esophagitis: Secondary | ICD-10-CM | POA: Diagnosis not present

## 2019-06-17 DIAGNOSIS — I10 Essential (primary) hypertension: Secondary | ICD-10-CM | POA: Diagnosis not present

## 2019-06-17 DIAGNOSIS — J9601 Acute respiratory failure with hypoxia: Secondary | ICD-10-CM | POA: Diagnosis not present

## 2019-06-17 DIAGNOSIS — E039 Hypothyroidism, unspecified: Secondary | ICD-10-CM | POA: Diagnosis not present

## 2019-06-17 DIAGNOSIS — J1289 Other viral pneumonia: Secondary | ICD-10-CM | POA: Diagnosis not present

## 2019-06-18 DIAGNOSIS — Z125 Encounter for screening for malignant neoplasm of prostate: Secondary | ICD-10-CM | POA: Diagnosis not present

## 2019-06-18 DIAGNOSIS — Z23 Encounter for immunization: Secondary | ICD-10-CM | POA: Diagnosis not present

## 2019-06-18 DIAGNOSIS — E782 Mixed hyperlipidemia: Secondary | ICD-10-CM | POA: Diagnosis not present

## 2019-06-18 DIAGNOSIS — E039 Hypothyroidism, unspecified: Secondary | ICD-10-CM | POA: Diagnosis not present

## 2019-06-24 DIAGNOSIS — K219 Gastro-esophageal reflux disease without esophagitis: Secondary | ICD-10-CM | POA: Diagnosis not present

## 2019-06-24 DIAGNOSIS — E039 Hypothyroidism, unspecified: Secondary | ICD-10-CM | POA: Diagnosis not present

## 2019-06-24 DIAGNOSIS — U071 COVID-19: Secondary | ICD-10-CM | POA: Diagnosis not present

## 2019-06-24 DIAGNOSIS — I10 Essential (primary) hypertension: Secondary | ICD-10-CM | POA: Diagnosis not present

## 2019-06-24 DIAGNOSIS — J1289 Other viral pneumonia: Secondary | ICD-10-CM | POA: Diagnosis not present

## 2019-06-24 DIAGNOSIS — J9601 Acute respiratory failure with hypoxia: Secondary | ICD-10-CM | POA: Diagnosis not present

## 2019-06-25 DIAGNOSIS — M545 Low back pain: Secondary | ICD-10-CM | POA: Diagnosis not present

## 2019-06-25 DIAGNOSIS — Z6832 Body mass index (BMI) 32.0-32.9, adult: Secondary | ICD-10-CM | POA: Diagnosis not present

## 2019-06-25 DIAGNOSIS — R252 Cramp and spasm: Secondary | ICD-10-CM | POA: Diagnosis not present

## 2019-06-25 DIAGNOSIS — E669 Obesity, unspecified: Secondary | ICD-10-CM | POA: Diagnosis not present

## 2019-06-25 DIAGNOSIS — M150 Primary generalized (osteo)arthritis: Secondary | ICD-10-CM | POA: Diagnosis not present

## 2019-06-27 ENCOUNTER — Ambulatory Visit
Admission: RE | Admit: 2019-06-27 | Discharge: 2019-06-27 | Disposition: A | Discharge status: Home / Self Care | Payer: Medicare Other | Source: Ambulatory Visit | Attending: Family Medicine | Admitting: Family Medicine

## 2019-06-27 ENCOUNTER — Other Ambulatory Visit: Payer: Self-pay | Admitting: Family Medicine

## 2019-06-27 DIAGNOSIS — R05 Cough: Secondary | ICD-10-CM

## 2019-06-27 DIAGNOSIS — R059 Cough, unspecified: Secondary | ICD-10-CM

## 2019-07-02 ENCOUNTER — Ambulatory Visit (INDEPENDENT_AMBULATORY_CARE_PROVIDER_SITE_OTHER): Payer: Medicare Other | Admitting: Pulmonary Disease

## 2019-07-02 ENCOUNTER — Encounter: Payer: Self-pay | Admitting: Pulmonary Disease

## 2019-07-02 ENCOUNTER — Other Ambulatory Visit: Payer: Self-pay

## 2019-07-02 VITALS — BP 104/70 | HR 95 | Temp 97.0°F | Ht 72.5 in | Wt 244.2 lb

## 2019-07-02 DIAGNOSIS — R0602 Shortness of breath: Secondary | ICD-10-CM | POA: Diagnosis not present

## 2019-07-02 DIAGNOSIS — U071 COVID-19: Secondary | ICD-10-CM

## 2019-07-02 MED ORDER — ALBUTEROL SULFATE HFA 108 (90 BASE) MCG/ACT IN AERS: 2.0000 | INHALATION_SPRAY | Freq: Four times a day (QID) | RESPIRATORY_TRACT | 6 refills | Status: DC | PRN

## 2019-07-02 NOTE — Patient Instructions (Signed)
Prior COVID-19 infection --START Albuterol inhaler as needed for shortness of breath or wheezing. You can use this prior to activity as well. --We will order Pulmonary function test to evaluate your lungs --CONTINUE your supplemental oxygen for goal >88% --We can consider Pulmonary Rehab in the future  Follow-up after PFTs

## 2019-07-02 NOTE — Progress Notes (Signed)
Subjective:   PATIENT ID: Colin [name] GENDER: male DOB: June 02, 1951, MRN: [id number]   HPI  Chief Complaint  Patient presents with  . Consult    september covid positive, pna worsening on chest xray    Reason for Visit: New consult for shortness of breath  [name] is a 68 year old male with recent COVID-19 pneumonia, HTN and GERD who presents as a new consult for shortness of breath.  He was recently hospitalized at [facility] from 8/30-05/27/19 for oxygen requirement secondary to COVID-19 pneumonia. On review of discharge summary, he was treated with steroids and remdesivir. He was discharged once his WBC, CRP, D-dimer and ferritin normalized or declined. Required oxygen at discharge.  Overall, he does not feel great but he able to work. He is only able to do 30 minutes of yard work.  His blood pressure was low at his Rheumatologist for whom he was seeing for DDD. He feels light headed and woozy. He has not been wearing his oxygen as his oxygen levels have been >96%. With activity or coughing fits, it will briefly drop 83%. Does not wear oxygen at night. Currently on levofloxacin x 7 days.  Social History: Never smoker  I have personally reviewed patient's past medical/family/social history, allergies, current medications.  Past Medical History:  Diagnosis Date  . Arthritis   . Bowel trouble    Stool leakage since colonscopy 02/2016  . Cancer ([place])    skin cancer  . Carpal tunnel syndrome, bilateral   . Depression    in teenage years   . GERD (gastroesophageal reflux disease)   . Hyperlipemia   . Hypertension   . Hypothyroidism   . Pneumonia   . Shortness of breath dyspnea    with bending over   . Wears glasses      Family History  Problem Relation Age of Onset  . Breast cancer Mother   . Hypertension Mother   . Heart disease Father   . Lung cancer Father   . Hypertension Father   . Breast cancer Sister      Social History   Occupational  History  . Occupation: IT sales professional: [employer]  Tobacco Use  . Smoking status: Never Smoker  . Smokeless tobacco: Never Used  Substance and Sexual Activity  . Alcohol use: Yes    Comment: ocassionally  . Drug use: No  . Sexual activity: Not on file    No Known Allergies   Outpatient Medications Prior to Visit  Medication Sig Dispense Refill  . cetirizine (ZYRTEC) 10 MG tablet Take 10 mg by mouth daily as needed for allergies.    Marland Kitchen levothyroxine (SYNTHROID) 75 MCG tablet Take 75 mcg by mouth daily before breakfast.    . losartan-hydrochlorothiazide (HYZAAR) 50-12.5 MG per tablet Take 1 tablet by mouth daily.    Marland Kitchen omeprazole (PRILOSEC) 40 MG capsule Take 40 mg by mouth daily.    Marland Kitchen lovastatin (MEVACOR) 40 MG tablet Take 40 mg by mouth every morning.     No facility-administered medications prior to visit.     Review of Systems  Constitutional: Positive for malaise/fatigue. Negative for chills, diaphoresis, fever and weight loss.  HENT: Positive for congestion. Negative for ear pain and sore throat.   Respiratory: Positive for cough, sputum production, shortness of breath and wheezing. Negative for hemoptysis.   Cardiovascular: Positive for leg swelling. Negative for chest pain and palpitations.  Gastrointestinal: Negative for abdominal  pain, heartburn and nausea.  Genitourinary: Negative for frequency.  Musculoskeletal: Positive for joint pain and myalgias.  Skin: Negative for itching and rash.  Neurological: Positive for dizziness. Negative for weakness and headaches.  Endo/Heme/Allergies: Does not bruise/bleed easily.  Psychiatric/Behavioral: Negative for depression. The patient is not nervous/anxious.    Objective:   Vitals:   07/02/19 0929  BP: 104/70  Pulse: 95  Temp: (!) 97 F (36.1 C)  TempSrc: Temporal  SpO2: 95%  Weight: 244 lb 3.2 oz (110.8 kg)  Height: 6' 0.5" (1.842 m)   SpO2: 95 % O2 Device: None (Room air)  Physical Exam:  General: Well-appearing, no acute distress HENT: [place], AT Eyes: EOMI, no scleral icterus Respiratory: Clear to auscultation bilaterally.  No crackles, wheezing or rales Cardiovascular: RRR, -M/R/G, no JVD GI: BS+, soft, nontender Extremities:-Edema,-tenderness Neuro: AAO x4, CNII-XII grossly intact Skin: Intact, no rashes or bruising Psych: Normal mood, normal affect  Data Reviewed:  Imaging: CXR 06/27/19 - Bibasilar airspace opacities, unchanged. Per radiology, slightly progressed compared to prior  PFT: None on file  Labs: CBC    Component Value Date/Time   WBC 9.4 05/27/2019 0155   RBC 4.73 05/27/2019 0155   HGB 14.0 05/27/2019 0155   HCT 42.8 05/27/2019 0155   PLT 258 05/27/2019 0155   MCV 90.5 05/27/2019 0155   MCH 29.6 05/27/2019 0155   MCHC 32.7 05/27/2019 0155   RDW 13.7 05/27/2019 0155   LYMPHSABS 0.5 (L) 05/27/2019 0155   MONOABS 0.5 05/27/2019 0155   EOSABS 0.0 05/27/2019 0155   BASOSABS 0.0 05/27/2019 0155   BMET    Component Value Date/Time   NA 135 05/27/2019 0155   K 4.8 05/27/2019 0155   CL 103 05/27/2019 0155   CO2 23 05/27/2019 0155   GLUCOSE 111 (H) 05/27/2019 0155   BUN 33 (H) 05/27/2019 0155   CREATININE 0.90 05/27/2019 0155   CALCIUM 8.9 05/27/2019 0155   GFRNONAA >60 05/27/2019 0155   GFRAA >60 05/27/2019 0155   Imaging, labs and tests noted above have been reviewed independently by me.    Assessment & Plan:   Discussion: 68 year old male with recent COVID-19 pneumonia, HTN and GERD who presents as a new consult for shortness of breath. In-clinic ambulatory oxygen demonstrates he no longer requires oxygen. I suspect his dyspnea has a significant component of deconditioning. I encouraged regular aerobic exercise with monitoring of his oxygen levels and also encouraged wearing oxygen if his saturations were <88% to allow him to continue activity to help develop endurance. We will evaluate his lung function post-viral infection to determine  any long term effects related to COVID and see if he would benefit from bronchodilator therapy. A trial of PRN BB was given to use for symptoms.  Prior COVID-19 infection --START Albuterol inhaler as needed for shortness of breath or wheezing. You can use this prior to activity as well. --We will order Pulmonary function test to evaluate your lungs --CONTINUE your supplemental oxygen for goal >88% --We can consider Pulmonary Rehab in the future  Health Maintenance Immunization History  Administered Date(s) Administered  . Influenza Split 07/24/2012  . Influenza, High Dose Seasonal PF 06/16/2019   Orders Placed This Encounter  Procedures  . Pulmonary function test    Standing Status:   Future    Standing Expiration Date:   07/01/2020    Order Specific Question:   Where should this test be performed?    Answer:   [facility]  Order Specific Question:   Full PFT: includes the following: basic spirometry, spirometry pre & post bronchodilator, diffusion capacity (DLCO), lung volumes    Answer:   Full PFT   Meds ordered this encounter  Medications  . albuterol (VENTOLIN HFA) 108 (90 Base) MCG/ACT inhaler    Sig: Inhale 2 puffs into the lungs every 6 (six) hours as needed for wheezing or shortness of breath.    Dispense:  6.7 g    Refill:  6    Return after PFTs.  [place], MD [facility] 07/02/2019 10:12 AM  Office Number [phone]

## 2019-07-14 ENCOUNTER — Encounter (HOSPITAL_COMMUNITY): Payer: Medicare Other

## 2019-07-19 ENCOUNTER — Other Ambulatory Visit (HOSPITAL_COMMUNITY)
Admission: RE | Admit: 2019-07-19 | Discharge: 2019-07-19 | Disposition: A | Discharge status: Home / Self Care | Payer: Medicare Other | Source: Ambulatory Visit | Attending: Pulmonary Disease | Admitting: Pulmonary Disease

## 2019-07-19 NOTE — Progress Notes (Signed)
Patient scheduled for covid test prior to 11/3 PFT. Patient tested positive for covid 05/18/19- since less than 90 days will not need to be retested. Appt canceled.

## 2019-07-21 ENCOUNTER — Telehealth: Payer: Self-pay | Admitting: Pulmonary Disease

## 2019-07-21 NOTE — Telephone Encounter (Signed)
Call made to patient, he reports he went to the [place] location and two ladies told him he did not need it his Covid test done because he has been tested in the last 3 months and that if he needed it they could do a rapid test while he was at [facility]. I apologized to the patient and made him aware that no one has updated [place] with this information so we would get back with him.   Call made to Joellen Jersey, she states Remo Lipps who is over Covid she sent out a ALGORITHM that if a patient has been positive in the last 30 days they do not need to be re-tested. She is going to fax over the documentation.   Call made to patient, made aware he can keep his PFT appt for tomorrow. Voiced understanding. Nothing further needed at this time.

## 2019-07-22 ENCOUNTER — Ambulatory Visit (HOSPITAL_COMMUNITY)
Admission: RE | Admit: 2019-07-22 | Discharge: 2019-07-22 | Disposition: A | Discharge status: Home / Self Care | Payer: Medicare Other | Source: Ambulatory Visit | Attending: Pulmonary Disease | Admitting: Pulmonary Disease

## 2019-07-22 ENCOUNTER — Encounter: Payer: Self-pay | Admitting: Pulmonary Disease

## 2019-07-22 ENCOUNTER — Other Ambulatory Visit: Payer: Self-pay

## 2019-07-22 ENCOUNTER — Encounter (HOSPITAL_COMMUNITY): Payer: Medicare Other

## 2019-07-22 ENCOUNTER — Ambulatory Visit (INDEPENDENT_AMBULATORY_CARE_PROVIDER_SITE_OTHER): Payer: Medicare Other | Admitting: Pulmonary Disease

## 2019-07-22 VITALS — BP 112/62 | HR 63 | Temp 98.0°F | Ht 73.0 in | Wt 251.6 lb

## 2019-07-22 DIAGNOSIS — Z8616 Personal history of COVID-19: Secondary | ICD-10-CM

## 2019-07-22 DIAGNOSIS — J984 Other disorders of lung: Secondary | ICD-10-CM

## 2019-07-22 DIAGNOSIS — R0602 Shortness of breath: Secondary | ICD-10-CM | POA: Diagnosis not present

## 2019-07-22 DIAGNOSIS — Z8619 Personal history of other infectious and parasitic diseases: Secondary | ICD-10-CM

## 2019-07-22 LAB — PULMONARY FUNCTION TEST
DL/VA % pred: 88 %
DL/VA: 3.58 ml/min/mmHg/L
DLCO unc % pred: 56 %
DLCO unc: 16.35 ml/min/mmHg
FEF 25-75 Post: 5.79 L/sec
FEF 25-75 Pre: 4.38 L/sec
FEF2575-%Change-Post: 32 %
FEF2575-%Pred-Post: 195 %
FEF2575-%Pred-Pre: 147 %
FEV1-%Change-Post: 8 %
FEV1-%Pred-Post: 79 %
FEV1-%Pred-Pre: 73 %
FEV1-Post: 3.03 L
FEV1-Pre: 2.79 L
FEV1FVC-%Change-Post: 3 %
FEV1FVC-%Pred-Pre: 115 %
FEV6-%Change-Post: 5 %
FEV6-%Pred-Post: 70 %
FEV6-%Pred-Pre: 66 %
FEV6-Post: 3.4 L
FEV6-Pre: 3.21 L
FEV6FVC-%Change-Post: 1 %
FEV6FVC-%Pred-Post: 105 %
FEV6FVC-%Pred-Pre: 103 %
FVC-%Change-Post: 4 %
FVC-%Pred-Post: 66 %
FVC-%Pred-Pre: 63 %
FVC-Post: 3.4 L
FVC-Pre: 3.25 L
Post FEV1/FVC ratio: 89 %
Post FEV6/FVC ratio: 100 %
Pre FEV1/FVC ratio: 86 %
Pre FEV6/FVC Ratio: 99 %
RV % pred: 83 %
RV: 2.12 L
TLC % pred: 71 %
TLC: 5.48 L

## 2019-07-22 MED ORDER — ALBUTEROL SULFATE (2.5 MG/3ML) 0.083% IN NEBU
2.5000 mg | INHALATION_SOLUTION | Freq: Once | RESPIRATORY_TRACT | Status: AC
  Administered 2019-07-22: 2.5 mg via RESPIRATORY_TRACT

## 2019-07-22 NOTE — Progress Notes (Signed)
Subjective:   PATIENT ID: Colin [name] GENDER: male DOB: 08-08-1951, MRN: [id number]   HPI  Chief Complaint  Patient presents with  . Follow-up    f/u SOB    Reason for Visit: Follow-up for COVID-19 pneumonia, PFT  Mr. Angelino Sauder is a 68 year old male with prior COVID-19 pneumonia diagnosed 05/2019 requiring hospitalization, HTN, GERD and DDD who presents for follow-up.  Since our last visit, he has been walking more often and working on his yard. He tried to wear oxygen while biking once. 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 When monitoring his O2 sats, he may desaturate briefly with activity or coughing but this will recover to >90%. Continues to have fatigue and shortness of breath. Denies wheezing or chest pain.  Social History: Never smoker  I have personally reviewed patient's past medical/family/social history/allergies/current medications.  Past Medical History:  Diagnosis Date  . Arthritis   . Bowel trouble    Stool leakage since colonscopy 02/2016  . Cancer ([place])    skin cancer  . Carpal tunnel syndrome, bilateral   . Depression    in teenage years   . GERD (gastroesophageal reflux disease)   . Hyperlipemia   . Hypertension   . Hypothyroidism   . Pneumonia   . Shortness of breath dyspnea    with bending over   . Wears glasses      Family History  Problem Relation Age of Onset  . Breast cancer Mother   . Hypertension Mother   . Heart disease Father   . Lung cancer Father   . Hypertension Father   . Breast cancer Sister      Social History   Occupational History  . Occupation: IT sales professional: [employer]  Tobacco Use  . Smoking status: Never Smoker  . Smokeless tobacco: Never Used  Substance and Sexual Activity  . Alcohol use: Yes    Comment: ocassionally  . Drug use: No  . Sexual activity: Not on file    No Known Allergies    Outpatient Medications Prior to Visit  Medication Sig Dispense Refill  . albuterol (VENTOLIN HFA) 108 (90 Base) MCG/ACT inhaler Inhale 2 puffs into the lungs every 6 (six) hours as needed for wheezing or shortness of breath. 6.7 g 6  . cetirizine (ZYRTEC) 10 MG tablet Take 10 mg by mouth daily as needed for allergies.    Marland Kitchen levothyroxine (SYNTHROID) 75 MCG tablet Take 75 mcg by mouth daily before breakfast.    . losartan-hydrochlorothiazide (HYZAAR) 50-12.5 MG per tablet Take 1 tablet by mouth daily.    Marland Kitchen lovastatin (MEVACOR) 40 MG tablet Take 40 mg by mouth every morning.    Marland Kitchen omeprazole (PRILOSEC) 40 MG capsule Take 40 mg by mouth daily.     No facility-administered medications prior to visit.     Review of Systems  Constitutional: Positive for malaise/fatigue. Negative for chills, diaphoresis, fever and weight loss.  HENT: Negative for congestion.   Respiratory: Positive for cough and shortness of breath. Negative for hemoptysis, sputum production and wheezing.   Cardiovascular: Negative for chest pain, palpitations and leg swelling.  Musculoskeletal: Positive for back pain.  Neurological: Negative for weakness.   Objective:   Vitals:   07/22/19 1435  BP: 112/62  Pulse: 63  Temp: 98 F (36.7 C)  TempSrc: Temporal  SpO2: 97%  Weight: 251 lb  9.6 oz (114.1 kg)  Height: 6\' 1"  (1.854 m)      Physical Exam: General: Well-appearing, no acute distress HENT: [place], AT Eyes: EOMI, no scleral icterus Respiratory: Bibasilar rales Cardiovascular: RRR, -M/R/G, no JVD GI: BS+, soft, nontender Extremities:-Edema,-tenderness Neuro: AAO x4, CNII-XII grossly intact Skin: Intact, no rashes or bruising Psych: Normal mood, normal affect  Data Reviewed:  Imaging: CXR 06/27/19 - Bibasilar airspace opacities, unchanged. Per radiology, slightly progressed compared to prior CXR 08/14/12 - No evidence of infiltrate, effusion or edema  PFT: 07/22/19 FVC 3.40 (66%) FEV1 3.03 (79%) Ratio 86   TLC 71% DLCO 56% Interpretation: Mild restrictive defect with moderately reduced gas exchange  Labs: CBC    Component Value Date/Time   WBC 9.4 05/27/2019 0155   RBC 4.73 05/27/2019 0155   HGB 14.0 05/27/2019 0155   HCT 42.8 05/27/2019 0155   PLT 258 05/27/2019 0155   MCV 90.5 05/27/2019 0155   MCH 29.6 05/27/2019 0155   MCHC 32.7 05/27/2019 0155   RDW 13.7 05/27/2019 0155   LYMPHSABS 0.5 (L) 05/27/2019 0155   MONOABS 0.5 05/27/2019 0155   EOSABS 0.0 05/27/2019 0155   BASOSABS 0.0 05/27/2019 0155   BMET    Component Value Date/Time   NA 135 05/27/2019 0155   K 4.8 05/27/2019 0155   CL 103 05/27/2019 0155   CO2 23 05/27/2019 0155   GLUCOSE 111 (H) 05/27/2019 0155   BUN 33 (H) 05/27/2019 0155   CREATININE 0.90 05/27/2019 0155   CALCIUM 8.9 05/27/2019 0155   GFRNONAA >60 05/27/2019 0155   GFRAA >60 05/27/2019 0155   Imaging, labs and tests noted above have been reviewed independently by me.    Assessment & Plan:   Discussion: 68 year-old male with prior COVID-19 pneumonia who presents for PFT follow-up. PFTs do suggest restrictive defect with moderately reduced gas exchange. His presentation including exertional hypoxemia and progressive infiltrates on CXR makes me concerned about the possibility of fibrosis developing post-viral infection. Also cannot rule out interstitial lung disease that may require additional work-up.  Prior COVID-19 infection Restrictive lung disease We will arrange for CT Chest without contrast to rule out Interstitial Lung Disease Continue regular aerobic exercise. Wear oxygen to maintain saturations >88% You can take albuterol 2 puffs prior to activity or as needed for shortness of breath  We will arrange follow-up telephone call to discuss test results We will also arrange follow-up in-person visit in 3 months to monitor your respiratory status  Health Maintenance Immunization History  Administered Date(s) Administered  . DTaP 02/15/2010   . Influenza Split 07/24/2012  . Influenza, High Dose Seasonal PF 06/16/2019  . Pneumococcal Conjugate-13 04/30/2018  . Pneumococcal Polysaccharide-23 06/18/2019   Orders Placed This Encounter  Procedures  . CT Chest Wo Contrast    Schedule ASAP next available    Standing Status:   Future    Standing Expiration Date:   09/20/2020    Order Specific Question:   ** REASON FOR EXAM (FREE TEXT)    Answer:   Restriuctive Lung Disease    Order Specific Question:   Preferred imaging location?    Answer:   [facility]    Order Specific Question:   Radiology Contrast Protocol - do NOT remove file path    Answer:   [email or web address]   No orders of the defined types were placed in this encounter.   Return in about 3 months (around 10/22/2019).  Christelle Igoe Rodman Pickle, MD [facility] 07/22/2019 8:49  AM  Office Number [phone]

## 2019-07-22 NOTE — Patient Instructions (Addendum)
Restrictive lung disease We will arrange for CT Chest without contrast to rule out Interstitial Lung Disease Continue regular aerobic exercise. Wear oxygen to maintain saturations >88% You can take albuterol 2 puffs prior to activity or as needed for shortness of breath  We will arrange follow-up telephone call to discuss test results We will also arrange follow-up in-person visit in 3 months to monitor your respiratory status

## 2019-07-23 DIAGNOSIS — M150 Primary generalized (osteo)arthritis: Secondary | ICD-10-CM | POA: Diagnosis not present

## 2019-07-23 DIAGNOSIS — M545 Low back pain: Secondary | ICD-10-CM | POA: Diagnosis not present

## 2019-07-23 DIAGNOSIS — R252 Cramp and spasm: Secondary | ICD-10-CM | POA: Diagnosis not present

## 2019-07-27 DIAGNOSIS — J984 Other disorders of lung: Secondary | ICD-10-CM | POA: Insufficient documentation

## 2019-07-27 DIAGNOSIS — Z8616 Personal history of COVID-19: Secondary | ICD-10-CM | POA: Insufficient documentation

## 2019-07-27 DIAGNOSIS — Z8619 Personal history of other infectious and parasitic diseases: Secondary | ICD-10-CM | POA: Insufficient documentation

## 2019-07-28 ENCOUNTER — Ambulatory Visit (HOSPITAL_COMMUNITY)
Admission: RE | Admit: 2019-07-28 | Discharge: 2019-07-28 | Disposition: A | Discharge status: Home / Self Care | Payer: Medicare Other | Source: Ambulatory Visit | Attending: Pulmonary Disease | Admitting: Pulmonary Disease

## 2019-07-28 ENCOUNTER — Other Ambulatory Visit: Payer: Self-pay

## 2019-07-28 ENCOUNTER — Other Ambulatory Visit: Payer: Self-pay | Admitting: Pulmonary Disease

## 2019-07-28 DIAGNOSIS — J849 Interstitial pulmonary disease, unspecified: Secondary | ICD-10-CM

## 2019-07-28 DIAGNOSIS — J1289 Other viral pneumonia: Secondary | ICD-10-CM | POA: Diagnosis not present

## 2019-07-28 DIAGNOSIS — U071 COVID-19: Secondary | ICD-10-CM | POA: Diagnosis not present

## 2019-07-28 NOTE — Progress Notes (Signed)
[name]. Requesting for CT Chest to be HRCT to rule out ILD

## 2019-07-31 ENCOUNTER — Telehealth (INDEPENDENT_AMBULATORY_CARE_PROVIDER_SITE_OTHER): Payer: Medicare Other | Admitting: Pulmonary Disease

## 2019-07-31 ENCOUNTER — Encounter: Payer: Self-pay | Admitting: Pulmonary Disease

## 2019-07-31 DIAGNOSIS — J8410 Pulmonary fibrosis, unspecified: Secondary | ICD-10-CM | POA: Insufficient documentation

## 2019-07-31 DIAGNOSIS — Z114 Encounter for screening for human immunodeficiency virus [HIV]: Secondary | ICD-10-CM | POA: Diagnosis not present

## 2019-07-31 DIAGNOSIS — R509 Fever, unspecified: Secondary | ICD-10-CM

## 2019-07-31 DIAGNOSIS — Z7289 Other problems related to lifestyle: Secondary | ICD-10-CM | POA: Diagnosis not present

## 2019-07-31 DIAGNOSIS — R0602 Shortness of breath: Secondary | ICD-10-CM

## 2019-07-31 DIAGNOSIS — J8489 Other specified interstitial pulmonary diseases: Secondary | ICD-10-CM | POA: Diagnosis not present

## 2019-07-31 MED ORDER — PREDNISONE 50 MG PO TABS: 50.0000 mg | ORAL_TABLET | Freq: Every day | ORAL | 0 refills | Status: DC

## 2019-07-31 MED ORDER — SULFAMETHOXAZOLE-TRIMETHOPRIM 800-160 MG PO TABS: 1.0000 | ORAL_TABLET | Freq: Two times a day (BID) | ORAL | 0 refills | Status: DC

## 2019-07-31 NOTE — Addendum Note (Signed)
Addended by: Suzzanne Cloud E on: 07/31/2019 02:18 PM   Modules accepted: Orders

## 2019-07-31 NOTE — Progress Notes (Signed)
Virtual Visit via Video Note  I connected with Colin [name] on 07/31/19 at 10:00 AM EST by a video enabled telemedicine application and verified that I am speaking with the correct person using two identifiers.  Subjective:   PATIENT ID: Colin [name] GENDER: male DOB: 02-28-1951, MRN: [id number]   HPI  Chief Complaint  Patient presents with   Follow-up    Discuss HRCT results.    Reason for Visit: Follow-up for CT Chest  Colin [name] is a 68 year old male with prior COVID-19 pneumonia 05/2019 requiring hospitalization, HTN, GERD and DDD who presents for follow-up.  Since our last visit, he continues to have good and bad days for his breathing. Overall he has noticed that his oxygen seems to be >90% more often but will still have desaturations <88% on exertion or coughing. Still has fatigue. He has also been attempting to do more work-outs on his bicycle.  Social History: Never smoker  I have personally reviewed patient's past medical/family/social history/allergies/current medications.  Past Medical History:  Diagnosis Date   Arthritis    Bowel trouble    Stool leakage since colonscopy 02/2016   Cancer ([place])    skin cancer   Carpal tunnel syndrome, bilateral    Depression    in teenage years    GERD (gastroesophageal reflux disease)    Hyperlipemia    Hypertension    Hypothyroidism    Pneumonia    Shortness of breath dyspnea    with bending over    Wears glasses      Family History  Problem Relation Age of Onset   Breast cancer Mother    Hypertension Mother    Heart disease Father    Lung cancer Father    Hypertension Father    Breast cancer Sister      Social History   Occupational History   Occupation: IT sales professional: [employer]  Tobacco Use   Smoking status: Never Smoker   Smokeless tobacco: Never Used  Substance and Sexual Activity   Alcohol use: Yes    Comment: ocassionally   Drug use:  No   Sexual activity: Not on file    No Known Allergies   Outpatient Medications Prior to Visit  Medication Sig Dispense Refill   albuterol (VENTOLIN HFA) 108 (90 Base) MCG/ACT inhaler Inhale 2 puffs into the lungs every 6 (six) hours as needed for wheezing or shortness of breath. 6.7 g 6   cetirizine (ZYRTEC) 10 MG tablet Take 10 mg by mouth daily as needed for allergies.     levothyroxine (SYNTHROID) 75 MCG tablet Take 75 mcg by mouth daily before breakfast.     losartan-hydrochlorothiazide (HYZAAR) 50-12.5 MG per tablet Take 1 tablet by mouth daily.     lovastatin (MEVACOR) 40 MG tablet Take 40 mg by mouth every morning. Medication on hold     omeprazole (PRILOSEC) 40 MG capsule Take 40 mg by mouth daily.     No facility-administered medications prior to visit.     Review of Systems  Constitutional: Positive for malaise/fatigue. Negative for chills, diaphoresis, fever and weight loss.  HENT: Negative for congestion.   Respiratory: Positive for cough and shortness of breath. Negative for hemoptysis, sputum production and wheezing.   Cardiovascular: Negative for chest pain, palpitations and leg swelling.  Musculoskeletal: Positive for back pain.  Neurological: Negative for weakness.   Objective:   There were no vitals filed for this visit.  Physical Exam: General: Well-appearing, no acute distress HENT: [place], AT Eyes: EOMI, no scleral icterus Respiratory: No audible distress or tachypnea present Cardiovascular: RRR, -M/R/G, no JVD Neuro: AAO x4, CNII-XII grossly intact Skin: Intact, no rashes or bruising Psych: Anxious mood, normal affect  Data Reviewed:  Imaging: CT Chest 07/28/19 - Mid-to-lower ground glass opacities with interstitial thickening predominantly in lower lobes concerning for NSIP CXR 06/27/19 - Bibasilar airspace opacities, unchanged. Per radiology, slightly progressed compared to prior CXR 08/14/12 - No evidence of infiltrate, effusion or  edema  PFT: 07/22/19 FVC 3.40 (66%) FEV1 3.03 (79%) Ratio 86  TLC 71% DLCO 56% Interpretation: Mild restrictive defect with moderately reduced gas exchange  Labs: CBC    Component Value Date/Time   WBC 9.4 05/27/2019 0155   RBC 4.73 05/27/2019 0155   HGB 14.0 05/27/2019 0155   HCT 42.8 05/27/2019 0155   PLT 258 05/27/2019 0155   MCV 90.5 05/27/2019 0155   MCH 29.6 05/27/2019 0155   MCHC 32.7 05/27/2019 0155   RDW 13.7 05/27/2019 0155   LYMPHSABS 0.5 (L) 05/27/2019 0155   MONOABS 0.5 05/27/2019 0155   EOSABS 0.0 05/27/2019 0155   BASOSABS 0.0 05/27/2019 0155   BMET    Component Value Date/Time   NA 135 05/27/2019 0155   K 4.8 05/27/2019 0155   CL 103 05/27/2019 0155   CO2 23 05/27/2019 0155   GLUCOSE 111 (H) 05/27/2019 0155   BUN 33 (H) 05/27/2019 0155   CREATININE 0.90 05/27/2019 0155   CALCIUM 8.9 05/27/2019 0155   GFRNONAA >60 05/27/2019 0155   GFRAA >60 05/27/2019 0155   Imaging, labs and test noted above have been reviewed independently by me.    Assessment & Plan:   Discussion: 68 year old male with prior COVID-19 pneumonia 05/2019 requiring hospitalization, HTN, GERD and DDD who presents for follow-up of restrictive defect and exertional hypoxemia. 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 We discussed the possible course of ILD, work-up and prognosis though prognosis is difficult to assess at this point. Addressed his and his wife's questions. Offered positive feedback as he does demonstrate overall slow but solid signs of clinical improvement in his oxygenation.   Prior COVID-19 infection Fibrotic NSIP Parenchymal changes seen on CT likely represent sequelae of his recent COVID-19 pneumonia however will obtain initial ILD work-up to rule out other causes of his newly diagnosed ILD. We discussed trial of glucocorticosteroids as he may potentially benefit if there is any cellular component of his NSIP present.  Adverse effects of steroids discussed including bruising, weight gain, fluid retention, Cushingoid features, hypertension, cardiac arrhythmias, osteoporosis, gastric ulcers, increased risk of infection, insomnia and hyperglycemia.  Labs for ILD evaluation ordered. START Prednisone 50 mg daily (0.5 mg/kg). START Trimethoprim-sulfamethoxazole 1 DS daily for Pneumocystis pneumonia prophylaxis  Follow-up in one month Continue regular aerobic exercise.  Wear oxygen to maintain saturations >88% with exertion You can take albuterol 2 puffs prior to activity or as needed for shortness of breath  Health Maintenance Immunization History  Administered Date(s) Administered   DTaP 02/15/2010   Influenza Split 07/24/2012   Influenza, High Dose Seasonal PF 06/16/2019   Pneumococcal Conjugate-13 04/30/2018   Pneumococcal Polysaccharide-23 06/18/2019   Orders Placed This Encounter  Procedures   Antinuclear Antib (ANA)    Standing Status:   Future    Standing Expiration Date:   123XX123   Cyclic citrul peptide antibody, IgG    Standing Status:  Future    Standing Expiration Date:   07/30/2020   HIV antibody (with reflex)    Standing Status:   Future    Standing Expiration Date:   10/31/2019   Myositis Assessr Plus Jo-1 Autoabs    Standing Status:   Future    Standing Expiration Date:   10/31/2019   Rheumatoid Factor    Standing Status:   Future    Standing Expiration Date:   07/30/2020   Meds ordered this encounter  Medications   predniSONE (DELTASONE) 50 MG tablet    Sig: Take 1 tablet (50 mg total) by mouth daily with breakfast.    Dispense:  30 tablet    Refill:  0   sulfamethoxazole-trimethoprim (BACTRIM DS) 800-160 MG tablet    Sig: Take 1 tablet by mouth 2 (two) times daily.    Dispense:  30 tablet    Refill:  0    Return in about 1 month (around 08/30/2019).  [place], MD [facility] 07/31/2019 1:02 PM  Office Number  [phone]

## 2019-07-31 NOTE — Patient Instructions (Signed)
Prior COVID-19 infection Fibrotic NSIP Parenchymal changes seen on CT likely represent sequelae of his recent COVID-19 pneumonia however will obtain initial ILD work-up to rule out other causes of his newly diagnosed ILD. We discussed trial of glucocorticosteroids as he may potentially benefit if there is any cellular component of his NSIP present. Adverse effects of steroids discussed including bruising, weight gain, fluid retention, Cushingoid features, hypertension, cardiac arrhythmias, osteoporosis, gastric ulcers, increased risk of infection, insomnia and hyperglycemia.  Labs for ILD evaluation ordered. START Prednisone 50 mg daily (0.5 mg/kg). START Trimethoprim-sulfamethoxazole 1 DS tablet daily for Pneumocystis pneumonia prophylaxis  Follow-up in one month Continue regular aerobic exercise.  Wear oxygen to maintain saturations >88% with exertion You can take albuterol 2 puffs prior to activity or as needed for shortness of breath  Follow-up in 1 month

## 2019-08-01 NOTE — Telephone Encounter (Signed)
Dr Loanne Drilling, please advise on pt email thanks!  Jake Shark M "Amelia Jo" sent to [facility]  Phone Number: [phone]        [facility] [name],  Thank you again for the time you spent with [name] and I this week!  A couple of follow up questions:  1. 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 (As I recall I think you said we don't know definitively but it's probably not?) Please clarify.  2. Should I be receiving some sort of thing to wear at night to see if my oxygen levels are staying up? I still haven't gotten anything. (Last night when I woke up I realized my nose plug had come out)  3. Any chance I could get a copy of my CT scans?   Appreciatively,  Colin [name]

## 2019-08-06 NOTE — Telephone Encounter (Signed)
[name] - please advise on lab results. Thanks.

## 2019-08-08 LAB — MYOSITIS ASSESSR PLUS JO-1 AUTOABS
EJ Autoabs: NOT DETECTED
Jo-1 Autoabs: <1 AI
Ku Autoabs: NOT DETECTED
Mi-2 Autoabs: NOT DETECTED
OJ Autoabs: NOT DETECTED
PL-12 Autoabs: NOT DETECTED
PL-7 Autoabs: NOT DETECTED
SRP Autoabs: NOT DETECTED

## 2019-08-08 LAB — ANA: Anti Nuclear Antibody (ANA): NEGATIVE

## 2019-08-08 LAB — HIV ANTIBODY (ROUTINE TESTING W REFLEX): HIV 1&2 Ab, 4th Generation: NONREACTIVE

## 2019-08-08 LAB — CYCLIC CITRUL PEPTIDE ANTIBODY, IGG: Cyclic Citrullin Peptide Ab: <16 UNITS

## 2019-08-08 LAB — RHEUMATOID FACTOR: Rheumatoid fact SerPl-aCnc: <14 IU/mL (ref <14)

## 2019-08-18 ENCOUNTER — Other Ambulatory Visit: Payer: Self-pay | Admitting: Pulmonary Disease

## 2019-08-18 MED ORDER — PREDNISONE 50 MG PO TABS: 50.0000 mg | ORAL_TABLET | Freq: Every day | ORAL | 0 refills | Status: DC

## 2019-08-18 NOTE — Telephone Encounter (Signed)
Hi Colin [name],  THe cramping has been for a couple of weeks. It's mainly in my feet/ankles and hands at night. Re: pharmacy, I use [facility] in [place]. Thanks!

## 2019-08-18 NOTE — Telephone Encounter (Signed)
Received the following message from patient:   "Hello Colin [name],   I am glad that you are feeling better! With your cramping, how long as this been going on? Have you noticed this after starting the prednisone and Bactrim combination? I will send this message over to [name] to make sure that she wants to remain on these medications. In the case that she does, which pharmacy would you like to use? "  Sent a message back to patient to ask more questions about the cramping. [name], please advise if you wanted him to remain on the prednisone and Bactrim combination? The last RX was only for 15 days. Thanks!

## 2019-08-19 NOTE — Telephone Encounter (Signed)
Per patient discussion on telephone regarding insomnia with prednisone. I have called pharmacy to reduce dose to prednisone 40 mg daily x 15 days.  Rodman Pickle, M.D. [facility] 08/19/2019 9:45 AM

## 2019-09-02 ENCOUNTER — Encounter: Payer: Self-pay | Admitting: Pulmonary Disease

## 2019-09-02 ENCOUNTER — Other Ambulatory Visit: Payer: Self-pay

## 2019-09-02 ENCOUNTER — Ambulatory Visit (INDEPENDENT_AMBULATORY_CARE_PROVIDER_SITE_OTHER): Payer: Medicare Other

## 2019-09-02 ENCOUNTER — Ambulatory Visit (INDEPENDENT_AMBULATORY_CARE_PROVIDER_SITE_OTHER): Payer: Medicare Other | Admitting: Pulmonary Disease

## 2019-09-02 VITALS — BP 162/90 | HR 62 | Temp 97.1°F | Ht 72.0 in | Wt 260.6 lb

## 2019-09-02 DIAGNOSIS — R0602 Shortness of breath: Secondary | ICD-10-CM

## 2019-09-02 DIAGNOSIS — J8489 Other specified interstitial pulmonary diseases: Secondary | ICD-10-CM

## 2019-09-02 MED ORDER — PREDNISONE 10 MG PO TABS: ORAL_TABLET | ORAL | 0 refills | Status: AC

## 2019-09-02 MED ORDER — SULFAMETHOXAZOLE-TRIMETHOPRIM 800-160 MG PO TABS: ORAL_TABLET | ORAL | 0 refills | Status: DC

## 2019-09-02 MED ORDER — TEMAZEPAM 15 MG PO CAPS: ORAL_CAPSULE | ORAL | 0 refills | Status: DC

## 2019-09-02 NOTE — Patient Instructions (Signed)
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

## 2019-09-02 NOTE — Progress Notes (Signed)
Subjective:   PATIENT ID: [name] GENDER: male DOB: 1950/12/22, MRN: [id number]   HPI  Chief Complaint  Patient presents with  . Follow-up    feels better breathing, insomnia, no cough, oxygen levels are above 90 with activity, 2L HS   Reason for Visit: Follow-up  Mr. Trevante Coburn is a 68 year old male with prior COVID-19 pneumonia 05/2019 requiring hospitalization, HTN, GERD and DDD who presents for follow-up.  He has been compliant on the steroids but does report increased energy and insomnia. He has tried [employer]. He is riding his stationary bicycle during 2-3 times a week for 20 minutes. He wears 2-3 L oxygen and maintaining saturations 98%. Denies cough, wheezing, chest pain or lower extremity edema.  Social History: Never smoker  I have personally reviewed patient's past medical/family/social history/allergies/current medications.  Past Medical History:  Diagnosis Date  . Arthritis   . Bowel trouble    Stool leakage since colonscopy 02/2016  . Cancer ([place])    skin cancer  . Carpal tunnel syndrome, bilateral   . Depression    in teenage years   . GERD (gastroesophageal reflux disease)   . Hyperlipemia   . Hypertension   . Hypothyroidism   . Pneumonia   . Shortness of breath dyspnea    with bending over   . Wears glasses      Family History  Problem Relation Age of Onset  . Breast cancer Mother   . Hypertension Mother   . Heart disease Father   . Lung cancer Father   . Hypertension Father   . Breast cancer Sister      Social History   Occupational History  . Occupation: IT sales professional: [employer]  Tobacco Use  . Smoking status: Never Smoker  . Smokeless tobacco: Never Used  Substance and Sexual Activity  . Alcohol use: Yes    Comment: ocassionally  . Drug use: No  . Sexual activity: Not on file    No Known Allergies   Outpatient Medications Prior to Visit  Medication Sig Dispense Refill  . albuterol  (VENTOLIN HFA) 108 (90 Base) MCG/ACT inhaler Inhale 2 puffs into the lungs every 6 (six) hours as needed for wheezing or shortness of breath. 6.7 g 6  . cetirizine (ZYRTEC) 10 MG tablet Take 10 mg by mouth daily as needed for allergies.    Marland Kitchen levothyroxine (SYNTHROID) 75 MCG tablet Take 75 mcg by mouth daily before breakfast.    . losartan-hydrochlorothiazide (HYZAAR) 50-12.5 MG per tablet Take 1 tablet by mouth daily.    Marland Kitchen lovastatin (MEVACOR) 40 MG tablet Take 40 mg by mouth every morning. Medication on hold    . omeprazole (PRILOSEC) 40 MG capsule Take 40 mg by mouth daily.    . predniSONE (DELTASONE) 50 MG tablet Take 1 tablet (50 mg total) by mouth daily with breakfast. 15 tablet 0   No facility-administered medications prior to visit.    Review of Systems  Constitutional: Positive for malaise/fatigue. Negative for chills, diaphoresis, fever and weight loss.  HENT: Negative for congestion.   Respiratory: Negative for cough, hemoptysis, sputum production, shortness of breath and wheezing.   Cardiovascular: Negative for chest pain, palpitations and leg swelling.   Objective:   Vitals:   09/02/19 1019  BP: (!) 162/90  Pulse: 62  Temp: (!) 97.1 F (36.2 C)  TempSrc: Temporal  SpO2: 98%  Weight: 260 lb 9.6 oz (118.2 kg)  Height: 6' (  1.829 m)      Physical Exam: General: Well-appearing, no acute distress HENT: [employer], AT Eyes: EOMI, no scleral icterus Respiratory: Faint bibasilar crackles, no wheezing or rhonchi Cardiovascular: RRR, -M/R/G, no JVD GI: BS+, soft, nontender Extremities:-Edema,-tenderness Neuro: AAO x4, CNII-XII grossly intact Skin: Intact, no rashes or bruising Psych: Normal mood, normal affect  Data Reviewed:  Imaging: CXR 09/02/19 - Improvement of basilar infiltrates CT Chest 07/28/19 - Mid-to-lower ground glass opacities with interstitial thickening predominantly in lower lobes concerning for NSIP CXR 06/27/19 - Bibasilar airspace opacities, unchanged. Per  radiology, slightly progressed compared to prior CXR 08/14/12 - No evidence of infiltrate, effusion or edema  PFT: 07/22/19 FVC 3.40 (66%) FEV1 3.03 (79%) Ratio 86  TLC 71% DLCO 56% Interpretation: Mild restrictive defect with moderately reduced gas exchange  Labs: CBC    Component Value Date/Time   WBC 9.4 05/27/2019 0155   RBC 4.73 05/27/2019 0155   HGB 14.0 05/27/2019 0155   HCT 42.8 05/27/2019 0155   PLT 258 05/27/2019 0155   MCV 90.5 05/27/2019 0155   MCH 29.6 05/27/2019 0155   MCHC 32.7 05/27/2019 0155   RDW 13.7 05/27/2019 0155   LYMPHSABS 0.5 (L) 05/27/2019 0155   MONOABS 0.5 05/27/2019 0155   EOSABS 0.0 05/27/2019 0155   BASOSABS 0.0 05/27/2019 0155   BMET    Component Value Date/Time   NA 135 05/27/2019 0155   K 4.8 05/27/2019 0155   CL 103 05/27/2019 0155   CO2 23 05/27/2019 0155   GLUCOSE 111 (H) 05/27/2019 0155   BUN 33 (H) 05/27/2019 0155   CREATININE 0.90 05/27/2019 0155   CALCIUM 8.9 05/27/2019 0155   GFRNONAA >60 05/27/2019 0155   GFRAA >60 05/27/2019 0155   Imaging, labs and test noted above have been reviewed independently by me.    Assessment & Plan:   Discussion: 68 year old male with prior COVID-19 pneumonia 05/2019 requiring hospitalization who presents for follow-up of ILD.   Parenchymal changes seen on CT likely represent sequelae of his recent COVID-19 pneumonia. Autoimmune work-up negative. Started on steroids in November 2020 with improved respiratory status though he does report insomnia.  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  Wear oxygen to maintain saturations >88% with exertion You can  take albuterol 2 puffs prior to activity or as needed for shortness of breath  Health Maintenance Immunization History  Administered Date(s) Administered  . DTaP 02/15/2010  . Influenza Split 07/24/2012  . Influenza, High Dose Seasonal PF 06/16/2019  . Pneumococcal Conjugate-13 04/30/2018  . Pneumococcal Polysaccharide-23 06/18/2019   Orders Placed This Encounter  Procedures  . DG Chest 2 View    Standing Status:   Future    Number of Occurrences:   1    Standing Expiration Date:   11/02/2020    Order Specific Question:   Reason for Exam (SYMPTOM  OR DIAGNOSIS REQUIRED)    Answer:   shortness of breath    Order Specific Question:   Preferred imaging location?    Answer:   Internal    Order Specific Question:   Radiology Contrast Protocol - do NOT remove file path    Answer:   \\  [email or web address]  . CT Chest Wo Contrast    First available in January 2021    Standing Status:   Future    Standing Expiration Date:   11/02/2020    Order Specific Question:   Preferred imaging location?    Answer:   [facility] Specific Question:   Radiology Contrast Protocol - do NOT remove file path    Answer:   \\[email or web address]   Meds ordered this encounter  Medications  . sulfamethoxazole-trimethoprim (BACTRIM DS) 800-160 MG tablet    Sig: 1 tab Mon., Wed., Fri.    Dispense:  20 tablet    Refill:  0  . predniSONE (DELTASONE) 10 MG tablet    Sig: Take 3 tablets (30 mg total) by mouth daily with breakfast for 21 days, THEN 2 tablets (20 mg total) daily with breakfast for 21 days.    Dispense:  105 tablet    Refill:  0  . temazepam (RESTORIL) 15 MG capsule    Sig: 1-2 tablets nightly as needed    Dispense:  60 capsule    Refill:  0   Follow-up after CT No follow-ups on file.  Wolfe, MD [facility] 09/02/2019 9:35 AM  Office Number [phone]

## 2019-09-03 ENCOUNTER — Encounter: Payer: Self-pay | Admitting: Cardiology

## 2019-10-01 ENCOUNTER — Ambulatory Visit (HOSPITAL_BASED_OUTPATIENT_CLINIC_OR_DEPARTMENT_OTHER)
Admission: RE | Admit: 2019-10-01 | Discharge: 2019-10-01 | Disposition: A | Discharge status: Home / Self Care | Payer: Medicare Other | Source: Ambulatory Visit | Attending: Pulmonary Disease | Admitting: Pulmonary Disease

## 2019-10-01 ENCOUNTER — Other Ambulatory Visit: Payer: Self-pay

## 2019-10-01 DIAGNOSIS — J8489 Other specified interstitial pulmonary diseases: Secondary | ICD-10-CM | POA: Diagnosis not present

## 2019-10-01 DIAGNOSIS — R0602 Shortness of breath: Secondary | ICD-10-CM | POA: Diagnosis not present

## 2019-10-04 MED ORDER — SULFAMETHOXAZOLE-TRIMETHOPRIM 800-160 MG PO TABS: ORAL_TABLET | ORAL | 0 refills | Status: DC

## 2019-10-08 ENCOUNTER — Other Ambulatory Visit: Payer: Self-pay

## 2019-10-08 ENCOUNTER — Encounter: Payer: Self-pay | Admitting: Pulmonary Disease

## 2019-10-08 ENCOUNTER — Ambulatory Visit (INDEPENDENT_AMBULATORY_CARE_PROVIDER_SITE_OTHER): Payer: Medicare Other | Admitting: Pulmonary Disease

## 2019-10-08 VITALS — BP 124/78 | HR 87 | Temp 98.2°F | Ht 72.5 in | Wt 270.6 lb

## 2019-10-08 DIAGNOSIS — R0602 Shortness of breath: Secondary | ICD-10-CM

## 2019-10-08 DIAGNOSIS — R0902 Hypoxemia: Secondary | ICD-10-CM | POA: Diagnosis not present

## 2019-10-08 DIAGNOSIS — J8489 Other specified interstitial pulmonary diseases: Secondary | ICD-10-CM

## 2019-10-08 MED ORDER — SULFAMETHOXAZOLE-TRIMETHOPRIM 800-160 MG PO TABS: ORAL_TABLET | ORAL | 0 refills | Status: DC

## 2019-10-08 MED ORDER — PREDNISONE 10 MG PO TABS: ORAL_TABLET | ORAL | 0 refills | Status: DC

## 2019-10-08 NOTE — Patient Instructions (Addendum)
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 --REFILL Trimethoprim-sulfamethoxazole 1 DS every M-W-F for Pneumocystis pneumonia prophylaxis  --Order 6MWT at next visit --Plan for repeat CT Chest in June 2021. 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  You can take albuterol 2 puffs prior to activity or as needed for shortness of breath     If you are interested in receiving your Covid-19 vaccine. You can visit [email or web address] and sign up through [facility].  Or if you want to schedule through [facility] Department you can visit Online at [email or web address] or By phone at [phone] (Option 2) from 8:00 am-5:00 pm, until all appointment slots have been filled.

## 2019-10-08 NOTE — Progress Notes (Signed)
Subjective:   PATIENT ID: Colin [name] GENDER: male DOB: 12-30-1950, MRN: [id number]   HPI  Chief Complaint  Patient presents with  . Follow-up    CT follow up , increased shortness of breath with decrease with prednisone   Reason for Visit: Follow-up  Colin [name] is a 69 year old male with prior COVID-19 pneumonia 05/2019 requiring hospitalization, HTN, GERD and DDD who presents for follow-up.  He has been compliant with his steroids. He reports when on the lower doses of 30 and 20 mg prednisone, he has more shortness of breath with exertion and talking. He reports occasional palpitations and tachycardia above 100 on his applewatch that occurs after standing and talking. Every other night he is getting 6-7 hours sleep. He continues to wear oxygen at night. During the day his oxygen is >90%. Has not been riding his bike as often. Denies cough, wheezing, chest pain. Has mild lower extremity edema  Social History: Never smoker  Past Medical History:  Diagnosis Date  . Arthritis   . Bowel trouble    Stool leakage since colonscopy 02/2016  . Cancer ([place])    skin cancer  . Carpal tunnel syndrome, bilateral   . Depression    in teenage years   . GERD (gastroesophageal reflux disease)   . Hyperlipemia   . Hypertension   . Hypothyroidism   . Pneumonia   . Shortness of breath dyspnea    with bending over   . Wears glasses     No Known Allergies   Outpatient Medications Prior to Visit  Medication Sig Dispense Refill  . albuterol (VENTOLIN HFA) 108 (90 Base) MCG/ACT inhaler Inhale 2 puffs into the lungs every 6 (six) hours as needed for wheezing or shortness of breath. 6.7 g 6  . cetirizine (ZYRTEC) 10 MG tablet Take 10 mg by mouth daily as needed for allergies.    Marland Kitchen levothyroxine (SYNTHROID) 75 MCG tablet Take 75 mcg by mouth daily before breakfast.    . losartan-hydrochlorothiazide (HYZAAR) 50-12.5 MG per tablet Take 1 tablet by mouth daily.    Marland Kitchen lovastatin (MEVACOR)  40 MG tablet Take 40 mg by mouth every morning. Medication on hold    . omeprazole (PRILOSEC) 40 MG capsule Take 40 mg by mouth daily.    . predniSONE (DELTASONE) 10 MG tablet Take 3 tablets (30 mg total) by mouth daily with breakfast for 21 days, THEN 2 tablets (20 mg total) daily with breakfast for 21 days. 105 tablet 0  . temazepam (RESTORIL) 15 MG capsule 1-2 tablets nightly as needed 60 capsule 0  . predniSONE (DELTASONE) 50 MG tablet Take 1 tablet (50 mg total) by mouth daily with breakfast. 15 tablet 0  . sulfamethoxazole-trimethoprim (BACTRIM DS) 800-160 MG tablet 1 tab Mon., Wed., Fri. 30 tablet 0   No facility-administered medications prior to visit.    Review of Systems  Constitutional: Negative for chills, diaphoresis, fever, malaise/fatigue and weight loss.  HENT: Negative for congestion.   Respiratory: Positive for shortness of breath. Negative for cough, hemoptysis, sputum production and wheezing.   Cardiovascular: Positive for palpitations. Negative for chest pain and leg swelling.   Objective:   Vitals:   10/08/19 0900  BP: 124/78  Pulse: 87  Temp: 98.2 F (36.8 C)  TempSrc: Temporal  SpO2: 96%  Weight: 270 lb 9.6 oz (122.7 kg)  Height: 6' 0.5" (1.842 m)   SpO2: 96 % O2 Device: None (Room air)  Physical Exam: General: Well-appearing, no  acute distress HENT: [place], AT Eyes: EOMI, no scleral icterus Respiratory: Clear to auscultation bilaterally.  No crackles, wheezing or rales Cardiovascular: RRR, -M/R/G, no JVD GI: BS+, soft, nontender Extremities: Minimal non-pitting lower extremity edema,-tenderness Neuro: AAO x4, CNII-XII grossly intact Skin: Intact, no rashes or bruising Psych: Normal mood, normal affect  Data Reviewed:  Imaging: CXR 09/02/19 - Improvement of basilar infiltrates CT Chest 07/28/19 - Mid-to-lower ground glass opacities with interstitial thickening predominantly in lower lobes concerning for NSIP CXR 06/27/19 - Bibasilar airspace  opacities, unchanged. Per radiology, slightly progressed compared to prior CXR 08/14/12 - No evidence of infiltrate, effusion or edema CT Chest 10/01/19 - Slightly improved ground glass opacities. Persistent interstitial reticular thickening without honeycombing that is overall stable.  PFT: 07/22/19 FVC 3.40 (66%) FEV1 3.03 (79%) Ratio 86  TLC 71% DLCO 56% Interpretation: Mild restrictive defect with moderately reduced gas exchange  Imaging, labs and test noted above have been reviewed independently by me.    Assessment & Plan:   Discussion: 69 year old male with prior COVID-19 pneumonia 05/2019 requiring hospitalization who presents for follow-up of ILD. Parenchymal changes seen on CT likely represent sequelae of his recent COVID-19 pneumonia. Autoimmune work-up negative. Started on steroids in November 2020 with improved respiratory status. When stepping down to prednisone 30 and 20 mg daily dosing he has developed recurrent shortness of breath.   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 --REFILL Trimethoprim-sulfamethoxazole 1 DS every M-W-F for Pneumocystis pneumonia prophylaxis  --Order 6MWT at next visit --Plan for repeat CT Chest in June 2021. 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  You can take albuterol 2 puffs prior to activity or as needed for shortness of breath  Health Maintenance Immunization History    Administered Date(s) Administered  . DTaP 02/15/2010  . Influenza Split 07/24/2012  . Influenza, High Dose Seasonal PF 06/16/2019  . Pneumococcal Conjugate-13 04/30/2018  . Pneumococcal Polysaccharide-23 06/18/2019   Orders Placed This Encounter  Procedures  . 6 minute walk    Standing Status:   Future    Standing Expiration Date:   10/07/2020    Order Specific Question:   Where should this test be performed?    Answer:   other   Meds ordered this encounter  Medications  . predniSONE (DELTASONE) 10 MG tablet    Sig: Alternate 20mg  and 30mg  until next visit    Dispense:  180 tablet    Refill:  0  . sulfamethoxazole-trimethoprim (BACTRIM DS) 800-160 MG tablet    Sig: 1 tab Mon., Wed., Fri.    Dispense:  36 tablet    Refill:  0   Return in about 2 months (around 12/06/2019).  I have spent a total time of 31-minutes on the day of the appointment reviewing prior documentation, coordinating care and discussing medical diagnosis and plan with the patient/family. Imaging, labs and tests included in this note have been reviewed and interpreted independently by me.  Caballo, MD [facility] 10/08/2019 7:43 PM  Office Number [phone]

## 2019-10-09 ENCOUNTER — Encounter: Payer: Self-pay | Admitting: Cardiology

## 2019-10-09 ENCOUNTER — Ambulatory Visit (INDEPENDENT_AMBULATORY_CARE_PROVIDER_SITE_OTHER): Payer: Medicare Other | Admitting: Cardiology

## 2019-10-09 VITALS — BP 130/86 | HR 100 | Ht 72.5 in | Wt 273.0 lb

## 2019-10-09 DIAGNOSIS — R0600 Dyspnea, unspecified: Secondary | ICD-10-CM | POA: Diagnosis not present

## 2019-10-09 DIAGNOSIS — I10 Essential (primary) hypertension: Secondary | ICD-10-CM

## 2019-10-09 DIAGNOSIS — I712 Thoracic aortic aneurysm, without rupture, unspecified: Secondary | ICD-10-CM

## 2019-10-09 DIAGNOSIS — E7800 Pure hypercholesterolemia, unspecified: Secondary | ICD-10-CM

## 2019-10-09 DIAGNOSIS — I2510 Atherosclerotic heart disease of native coronary artery without angina pectoris: Secondary | ICD-10-CM | POA: Diagnosis not present

## 2019-10-09 DIAGNOSIS — R0609 Other forms of dyspnea: Secondary | ICD-10-CM

## 2019-10-09 MED ORDER — ROSUVASTATIN CALCIUM 40 MG PO TABS: 40.0000 mg | ORAL_TABLET | Freq: Every day | ORAL | 3 refills | Status: DC

## 2019-10-09 MED ORDER — ASPIRIN EC 81 MG PO TBEC: 81.0000 mg | DELAYED_RELEASE_TABLET | Freq: Every day | ORAL | 3 refills | Status: DC

## 2019-10-09 NOTE — Progress Notes (Signed)
Colin Forts MD Reason for referral-TAA and CAD  HPI: 69 year old male for evaluation of thoracic aortic aneurysm and CAD at request of Antony Contras MD. Nuclear study 2013 showed no ischemia or infarction and ejection fraction greater than 55%.  Chest CT January 2021 suggest UIP stable, small pulmonary nodules, dilated aortic root measuring 4 cm, aortic atherosclerosis and three-vessel coronary disease.  Patient is seen by pulmonary.  His parenchymal changes seen on CT are felt possibly sequela of recent COVID-19 infection. Laboratories December 2020 showed total cholesterol 303, LDL 192.  Since patient's diagnosis of Covid in August/September he has had dyspnea on exertion.  This improves with prednisone.  There is no orthopnea or PND.  Minimal pedal edema.  He denies chest pain, palpitations or syncope.  Current Outpatient Medications  Medication Sig Dispense Refill  . albuterol (VENTOLIN HFA) 108 (90 Base) MCG/ACT inhaler Inhale 2 puffs into the lungs every 6 (six) hours as needed for wheezing or shortness of breath. 6.7 g 6  . cetirizine (ZYRTEC) 10 MG tablet Take 10 mg by mouth daily as needed for allergies.    Marland Kitchen levothyroxine (SYNTHROID) 75 MCG tablet Take 75 mcg by mouth daily before breakfast.    . losartan-hydrochlorothiazide (HYZAAR) 50-12.5 MG per tablet Take 1 tablet by mouth daily.    Marland Kitchen lovastatin (MEVACOR) 40 MG tablet Take 40 mg by mouth every morning. Medication on hold    . omeprazole (PRILOSEC) 40 MG capsule Take 40 mg by mouth daily.    . predniSONE (DELTASONE) 10 MG tablet Take 3 tablets (30 mg total) by mouth daily with breakfast for 21 days, THEN 2 tablets (20 mg total) daily with breakfast for 21 days. 105 tablet 0  . predniSONE (DELTASONE) 10 MG tablet Alternate 20mg  and 30mg  until next visit 180 tablet 0  . sulfamethoxazole-trimethoprim (BACTRIM DS) 800-160 MG tablet 1 tab Mon., Wed., Fri. 36 tablet 0  . temazepam (RESTORIL) 15 MG capsule 1-2 tablets nightly  as needed 60 capsule 0   No current facility-administered medications for this visit.    No Known Allergies   Past Medical History:  Diagnosis Date  . Arthritis   . Bowel trouble    Stool leakage since colonscopy 02/2016  . Cancer ([place])    skin cancer  . Carpal tunnel syndrome, bilateral   . Depression    in teenage years   . GERD (gastroesophageal reflux disease)   . Hyperlipemia   . Hypertension   . Hypothyroidism   . Interstitial lung disease ([place])   . Pneumonia   . Shortness of breath dyspnea    with bending over   . Wears glasses     Past Surgical History:  Procedure Laterality Date  . APPENDECTOMY    . CARPAL TUNNEL RELEASE  04/23/2012   Procedure: CARPAL TUNNEL RELEASE;  Surgeon: Cammie Sickle., MD;  Location: [facility];  Service: Orthopedics;  Laterality: Left;  . CARPAL TUNNEL RELEASE  06/27/2012   Procedure: CARPAL TUNNEL RELEASE;  Surgeon: Cammie Sickle., MD;  Location: [facility];  Service: Orthopedics;  Laterality: Right;  . CERVICAL DISCECTOMY  2007  . colonscopy     polyps removed / benign  . INCISIONAL HERNIA REPAIR  2011   subcostal from append  . JOINT REPLACEMENT  81,87   lt total hip  . LAPAROSCOPIC APPENDECTOMY  2010   required open repair  . TONSILLECTOMY    . TOTAL HIP ARTHROPLASTY  x3 left hip [phone]  . TOTAL HIP ARTHROPLASTY Right 04/05/2016   Procedure: RIGHT TOTAL HIP ARTHROPLASTY ANTERIOR APPROACH;  Surgeon: Gaynelle Arabian, MD;  Location: [facility];  Service: Orthopedics;  Laterality: Right;    Social History   Socioeconomic History  . Marital status: Married    Spouse name: Not on file  . Number of children: 3  . Years of education: Not on file  . Highest education level: Not on file  Occupational History  . Occupation: IT sales professional: [employer]  Tobacco Use  . Smoking status: Never Smoker  . Smokeless tobacco: Never Used  Substance and Sexual  Activity  . Alcohol use: Yes    Comment: ocassionally  . Drug use: No  . Sexual activity: Not on file  Other Topics Concern  . Not on file  Social History Narrative  . Not on file   Social Determinants of Health   Financial Resource Strain:   . Difficulty of Paying Living Expenses: Not on file  Food Insecurity:   . Worried About Charity fundraiser in the Last Year: Not on file  . Ran Out of Food in the Last Year: Not on file  Transportation Needs:   . Lack of Transportation (Medical): Not on file  . Lack of Transportation (Non-Medical): Not on file  Physical Activity:   . Days of Exercise per Week: Not on file  . Minutes of Exercise per Session: Not on file  Stress:   . Feeling of Stress : Not on file  Social Connections:   . Frequency of Communication with Friends and Family: Not on file  . Frequency of Social Gatherings with Friends and Family: Not on file  . Attends Religious Services: Not on file  . Active Member of Clubs or Organizations: Not on file  . Attends Archivist Meetings: Not on file  . Marital Status: Not on file  Intimate Partner Violence:   . Fear of Current or Ex-Partner: Not on file  . Emotionally Abused: Not on file  . Physically Abused: Not on file  . Sexually Abused: Not on file    Family History  Problem Relation Age of Onset  . Breast cancer Mother   . Hypertension Mother   . Heart disease Father   . Lung cancer Father   . Hypertension Father   . Breast cancer Sister     ROS: Some back pain but no fevers or chills, productive cough, hemoptysis, dysphasia, odynophagia, melena, hematochezia, dysuria, hematuria, rash, seizure activity, orthopnea, PND, pedal edema, claudication. Remaining systems are negative.  Physical Exam:   Blood pressure 130/86, pulse 100, height 6' 0.5" (1.842 m), weight 273 lb (123.8 kg).  General:  Well developed/well nourished in NAD Skin warm/dry Patient not depressed No peripheral  clubbing Back-normal HEENT-normal/normal eyelids Neck supple/normal carotid upstroke bilaterally; no bruits; no JVD; no thyromegaly chest - CTA/ normal expansion CV - RRR/normal S1 and S2; no murmurs, rubs or gallops;  PMI nondisplaced Abdomen -NT/ND, no HSM, no mass, + bowel sounds, no bruit 2+ femoral pulses, no bruits Ext-no edema, chords, 2+ DP Neuro-grossly nonfocal  ECG -May 18, 2019-sinus rhythm with nonspecific ST changes.  Personally reviewed  A/P  1 coronary artery disease-based on CT showing coronary calcification.  We will arrange a [place] nuclear study to screen for ischemia.  Treat with aspirin 81 mg daily and statin.  2 dyspnea-likely secondary to pulmonary disease as outlined in HPI with abnormal chest  CT.  We will arrange a [place] nuclear study to screen for ischemia as a possible contributor.  Schedule echocardiogram to assess LV function.  3 hypertension-blood pressure controlled.  Continue present medications and follow.  4 hyperlipidemia-given documented vascular disease I will discontinue lovastatin and treat with Crestor 40 mg daily.  Check lipids and liver in 12 weeks.  5 dilated aortic root-we will plan repeat CTA January 2022.  Kirk Ruths, MD

## 2019-10-09 NOTE — Patient Instructions (Signed)
Medication Instructions:  STOP TAKING THE LOVASTATIN BEING TAKING ASPIRIN 81MG  DAILY= 1 TABLET DAILY BEGIN TAKING CRESTOR 40MG  DAILY= 1 TABLET DAILY *If you need a refill on your cardiac medications before your next appointment, please call your pharmacy*  Lab Work: FASTING LIPID AND LIVER IN 12 WEEKS If you have labs (blood work) drawn today and your tests are completely normal, you will receive your results only by: Marland Kitchen MyChart Message (if you have MyChart) OR . A paper copy in the mail If you have any lab test that is abnormal or we need to change your treatment, we will call you to review the results.  Testing/Procedures: Your physician has requested that you have an echocardiogram. Echocardiography is a painless test that uses sound waves to create images of your heart. It provides your doctor with information about the size and shape of your heart and how well your heart's chambers and valves are working. This procedure takes approximately one hour. There are no restrictions for this procedure.  [facility] has requested that you have a lexiscan myoview. For further information please visit [email or web address]. Please follow instruction sheet, as given.   [place] January OF 2022  Follow-Up: At [facility], you and your health needs are our priority.  As part of our continuing mission to provide you with exceptional heart care, we have created designated Provider Care Teams.  These Care Teams include your primary Cardiologist (physician) and Advanced Practice Providers (APPs -  Physician Assistants and Nurse Practitioners) who all work together to provide you with the care you need, when you need it.  Your next appointment:   6 month(s)  The format for your next appointment:   In Person  Provider:   Kirk Ruths, MD

## 2019-10-23 ENCOUNTER — Telehealth (HOSPITAL_COMMUNITY): Payer: Self-pay | Admitting: *Deleted

## 2019-10-23 NOTE — Telephone Encounter (Signed)
Left message on voicemail per DPR in reference to upcoming appointment scheduled on 10/27/19 with detailed instructions given per Myocardial Perfusion Study Information Sheet for the test. LM to arrive 15 minutes early, and that it is imperative to arrive on time for appointment to keep from having the test rescheduled. If you need to cancel or reschedule your appointment, please call the office within 24 hours of your appointment. Failure to do so may result in a cancellation of your appointment, and a $50 no show fee. Phone number given for call back for any questions. Colin [name] Jacqueline    

## 2019-10-27 ENCOUNTER — Encounter (HOSPITAL_COMMUNITY): Payer: Self-pay | Admitting: *Deleted

## 2019-10-27 ENCOUNTER — Other Ambulatory Visit: Payer: Self-pay

## 2019-10-27 ENCOUNTER — Ambulatory Visit (HOSPITAL_COMMUNITY): Payer: Medicare Other | Attending: Cardiovascular Disease

## 2019-10-27 ENCOUNTER — Ambulatory Visit (HOSPITAL_BASED_OUTPATIENT_CLINIC_OR_DEPARTMENT_OTHER): Payer: Medicare Other

## 2019-10-27 DIAGNOSIS — I10 Essential (primary) hypertension: Secondary | ICD-10-CM

## 2019-10-27 DIAGNOSIS — E7800 Pure hypercholesterolemia, unspecified: Secondary | ICD-10-CM | POA: Diagnosis not present

## 2019-10-27 DIAGNOSIS — I2510 Atherosclerotic heart disease of native coronary artery without angina pectoris: Secondary | ICD-10-CM | POA: Diagnosis not present

## 2019-10-27 DIAGNOSIS — R0609 Other forms of dyspnea: Secondary | ICD-10-CM

## 2019-10-27 DIAGNOSIS — R0600 Dyspnea, unspecified: Secondary | ICD-10-CM | POA: Diagnosis not present

## 2019-10-27 DIAGNOSIS — I712 Thoracic aortic aneurysm, without rupture, unspecified: Secondary | ICD-10-CM

## 2019-10-27 MED ORDER — TECHNETIUM TC 99M TETROFOSMIN IV KIT
32.5000 | PACK | Freq: Once | INTRAVENOUS | Status: AC | PRN
  Administered 2019-10-27: 32.5 via INTRAVENOUS
  Filled 2019-10-27: qty 33

## 2019-10-28 ENCOUNTER — Ambulatory Visit (HOSPITAL_COMMUNITY): Payer: Medicare Other

## 2019-10-30 ENCOUNTER — Ambulatory Visit (HOSPITAL_COMMUNITY): Payer: Medicare Other | Attending: Cardiology

## 2019-10-30 ENCOUNTER — Other Ambulatory Visit: Payer: Self-pay

## 2019-10-30 DIAGNOSIS — R0600 Dyspnea, unspecified: Secondary | ICD-10-CM | POA: Diagnosis not present

## 2019-10-30 DIAGNOSIS — I2510 Atherosclerotic heart disease of native coronary artery without angina pectoris: Secondary | ICD-10-CM | POA: Diagnosis not present

## 2019-10-30 DIAGNOSIS — I10 Essential (primary) hypertension: Secondary | ICD-10-CM | POA: Diagnosis not present

## 2019-10-30 DIAGNOSIS — I712 Thoracic aortic aneurysm, without rupture: Secondary | ICD-10-CM | POA: Insufficient documentation

## 2019-10-30 DIAGNOSIS — E7800 Pure hypercholesterolemia, unspecified: Secondary | ICD-10-CM | POA: Insufficient documentation

## 2019-10-30 LAB — MYOCARDIAL PERFUSION IMAGING
LV dias vol: 79 mL (ref 62–150)
LV sys vol: 27 mL
Peak HR: 79 {beats}/min
Rest HR: 65 {beats}/min
SDS: 3
SRS: 0
SSS: 3
TID: 0.93

## 2019-10-30 MED ORDER — REGADENOSON 0.4 MG/5ML IV SOLN
0.4000 mg | Freq: Once | INTRAVENOUS | Status: AC
  Administered 2019-10-30: 0.4 mg via INTRAVENOUS

## 2019-10-30 MED ORDER — TECHNETIUM TC 99M TETROFOSMIN IV KIT
32.5000 | PACK | Freq: Once | INTRAVENOUS | Status: AC | PRN
  Administered 2019-10-30: 32.5 via INTRAVENOUS
  Filled 2019-10-30: qty 33

## 2019-11-06 ENCOUNTER — Ambulatory Visit (HOSPITAL_COMMUNITY): Payer: Medicare Other

## 2019-11-12 DIAGNOSIS — E7800 Pure hypercholesterolemia, unspecified: Secondary | ICD-10-CM | POA: Diagnosis not present

## 2019-11-13 DIAGNOSIS — Z23 Encounter for immunization: Secondary | ICD-10-CM | POA: Diagnosis not present

## 2019-11-20 DIAGNOSIS — Z471 Aftercare following joint replacement surgery: Secondary | ICD-10-CM | POA: Diagnosis not present

## 2019-11-20 DIAGNOSIS — Z96641 Presence of right artificial hip joint: Secondary | ICD-10-CM | POA: Diagnosis not present

## 2019-11-20 DIAGNOSIS — Z96642 Presence of left artificial hip joint: Secondary | ICD-10-CM | POA: Diagnosis not present

## 2019-11-20 DIAGNOSIS — Z96643 Presence of artificial hip joint, bilateral: Secondary | ICD-10-CM | POA: Diagnosis not present

## 2019-11-27 DIAGNOSIS — M545 Low back pain: Secondary | ICD-10-CM | POA: Diagnosis not present

## 2019-11-27 DIAGNOSIS — R262 Difficulty in walking, not elsewhere classified: Secondary | ICD-10-CM | POA: Diagnosis not present

## 2019-11-27 DIAGNOSIS — M25559 Pain in unspecified hip: Secondary | ICD-10-CM | POA: Diagnosis not present

## 2019-11-27 DIAGNOSIS — Z96643 Presence of artificial hip joint, bilateral: Secondary | ICD-10-CM | POA: Diagnosis not present

## 2019-12-01 ENCOUNTER — Ambulatory Visit: Payer: Medicare Other | Admitting: Pulmonary Disease

## 2019-12-01 ENCOUNTER — Ambulatory Visit: Payer: Medicare Other

## 2019-12-01 ENCOUNTER — Ambulatory Visit: Payer: Medicare Other | Admitting: Primary Care

## 2019-12-02 DIAGNOSIS — M545 Low back pain: Secondary | ICD-10-CM | POA: Diagnosis not present

## 2019-12-02 DIAGNOSIS — R262 Difficulty in walking, not elsewhere classified: Secondary | ICD-10-CM | POA: Diagnosis not present

## 2019-12-02 DIAGNOSIS — Z96643 Presence of artificial hip joint, bilateral: Secondary | ICD-10-CM | POA: Diagnosis not present

## 2019-12-02 DIAGNOSIS — M25559 Pain in unspecified hip: Secondary | ICD-10-CM | POA: Diagnosis not present

## 2019-12-05 ENCOUNTER — Ambulatory Visit (INDEPENDENT_AMBULATORY_CARE_PROVIDER_SITE_OTHER): Payer: Medicare Other | Admitting: Primary Care

## 2019-12-05 ENCOUNTER — Encounter: Payer: Self-pay | Admitting: Primary Care

## 2019-12-05 ENCOUNTER — Other Ambulatory Visit: Payer: Self-pay

## 2019-12-05 DIAGNOSIS — M545 Low back pain: Secondary | ICD-10-CM | POA: Diagnosis not present

## 2019-12-05 DIAGNOSIS — J8489 Other specified interstitial pulmonary diseases: Secondary | ICD-10-CM | POA: Diagnosis not present

## 2019-12-05 DIAGNOSIS — Z96643 Presence of artificial hip joint, bilateral: Secondary | ICD-10-CM | POA: Diagnosis not present

## 2019-12-05 DIAGNOSIS — R0602 Shortness of breath: Secondary | ICD-10-CM

## 2019-12-05 DIAGNOSIS — R262 Difficulty in walking, not elsewhere classified: Secondary | ICD-10-CM | POA: Diagnosis not present

## 2019-12-05 DIAGNOSIS — M25559 Pain in unspecified hip: Secondary | ICD-10-CM | POA: Diagnosis not present

## 2019-12-05 MED ORDER — PREDNISONE 10 MG PO TABS: ORAL_TABLET | ORAL | 0 refills | Status: DC

## 2019-12-05 NOTE — Progress Notes (Signed)
Six Minute Walk - 12/05/19 1517      Six Minute Walk   Medications taken before test (dose and time)  ASA 81mg , Synthroid 75 mcg, Hyzaar 50-12.5mg , Prilosec 40mg , Prednisone 30mg , Crestor 40mg , Bactrim Ds, all taken approx 0600.    Supplemental oxygen during test?  No    Lap distance in meters   34 meters    Laps Completed  14    Partial lap (in meters)  0 meters    Baseline BP (sitting)  114/68    Baseline Heartrate  87    Baseline Dyspnea (Borg Scale)  0.5    Baseline Fatigue (Borg Scale)  0.5    Baseline SPO2  99 %      End of Test Values    BP (sitting)  148/90    Heartrate  121    Dyspnea (Borg Scale)  3    Fatigue (Borg Scale)  3    SPO2  100 %      2 Minutes Post Walk Values   BP (sitting)  126/82    Heartrate  94    SPO2  98 %    Stopped or paused before six minutes?  No    Other Symptoms at end of exercise:  Hip pain      Interpretation   Distance completed  476 meters    Tech Comments:  pt walked with a cane (noted limb length difference, wearing shoe lift.  pt tolerated walk well.

## 2019-12-05 NOTE — Progress Notes (Signed)
@Patient  ID: Colin [name], male    DOB: [birth year], 69 y.o.   MRN: [id number]  Chief Complaint  Patient presents with  . Follow-up    f/u 6 min walk/NSAIP. Breathing is at his baseline.     Referring provider: Antony Contras, MD  HPI: 69 year old male, never smoke. PMH significant for NSIP, hypertension, GERD. Patient of [name]. Hospitalized with COVID-19 pneumonia in September. Parenchymal changes on CT. Autoimmune work-up negative. He was started on steroids in November 2020, initial improvement with some recurrent shortness of breath with tapper.   12/05/2019 Patient presents today for 2 months follow-up. He is doing well, breathing is mostly stable. He would like to taper off prednisone. He does reports some increased dyspnea when he was originally tapered from 50mg  of prednisone. He has been taking 30mg  and 20mg  every other day for the last 8 weeks and Bactrium DS MWF. He is due for repeat CT chest in June. He has some cough but reports that it is better than it was 2 weeks ago. He has only required his rescue inhaler once in the last several months. He does reports difficulty falling asleep every 4 days or so, states that he will take bendaryl and he is basically to re-charged.   No Known Allergies  Immunization History  Administered Date(s) Administered  . DTaP 02/15/2010  . Influenza Split 07/24/2012  . Influenza, High Dose Seasonal PF 06/16/2019  . Pneumococcal Conjugate-13 04/30/2018  . Pneumococcal Polysaccharide-23 06/18/2019    Past Medical History:  Diagnosis Date  . Arthritis   . Bowel trouble    Stool leakage since colonscopy 02/2016  . Cancer ([place])    skin cancer  . Carpal tunnel syndrome, bilateral   . Depression    in teenage years   . GERD (gastroesophageal reflux disease)   . Hyperlipemia   . Hypertension   . Hypothyroidism   . Interstitial lung disease ([place])   . Pneumonia   . Shortness of breath dyspnea    with bending over   . Wears glasses       Tobacco History: Social History   Tobacco Use  Smoking Status Never Smoker  Smokeless Tobacco Never Used   Counseling given: Not Answered   Outpatient Medications Prior to Visit  Medication Sig Dispense Refill  . albuterol (VENTOLIN HFA) 108 (90 Base) MCG/ACT inhaler Inhale 2 puffs into the lungs every 6 (six) hours as needed for wheezing or shortness of breath. 6.7 g 6  . aspirin EC 81 MG tablet Take 1 tablet (81 mg total) by mouth daily. 90 tablet 3  . cetirizine (ZYRTEC) 10 MG tablet Take 10 mg by mouth daily as needed for allergies.    Marland Kitchen levothyroxine (SYNTHROID) 75 MCG tablet Take 75 mcg by mouth daily before breakfast.    . losartan-hydrochlorothiazide (HYZAAR) 50-12.5 MG per tablet Take 1 tablet by mouth daily.    . Omega-3 Fatty Acids (FISH OIL) 1000 MG CAPS Take by mouth.    Marland Kitchen omeprazole (PRILOSEC) 40 MG capsule Take 40 mg by mouth daily.    . rosuvastatin (CRESTOR) 40 MG tablet Take 1 tablet (40 mg total) by mouth daily. 90 tablet 3  . sulfamethoxazole-trimethoprim (BACTRIM DS) 800-160 MG tablet 1 tab Mon., Wed., Fri. 36 tablet 0  . temazepam (RESTORIL) 15 MG capsule 1-2 tablets nightly as needed 60 capsule 0  . predniSONE (DELTASONE) 10 MG tablet Alternate 20mg  and 30mg  until next visit 180 tablet 0   No facility-administered  medications prior to visit.    Review of Systems  Review of Systems  Constitutional: Negative.   Respiratory: Positive for cough and shortness of breath. Negative for chest tightness and wheezing.   Cardiovascular: Negative.     Physical Exam  BP 122/76 (BP Location: Right Arm, Patient Position: Sitting, Cuff Size: Large)   Pulse 76   Ht 6\' 1"  (1.854 m)   Wt 270 lb (122.5 kg)   SpO2 96% Comment: room air  BMI 35.62 kg/m  Physical Exam Constitutional:      General: He is not in acute distress.    Appearance: Normal appearance. He is not ill-appearing.  HENT:     Head: Normocephalic and atraumatic.     Mouth/Throat:     Mouth:  Mucous membranes are moist.     Pharynx: Oropharynx is clear.  Cardiovascular:     Rate and Rhythm: Normal rate and regular rhythm.  Pulmonary:     Effort: Pulmonary effort is normal. No respiratory distress.     Breath sounds: No wheezing or rhonchi.     Comments: Fine crackles bases. No respiratory distress  Musculoskeletal:        General: Normal range of motion.  Skin:    General: Skin is warm and dry.  Neurological:     General: No focal deficit present.     Mental Status: He is alert and oriented to person, place, and time. Mental status is at baseline.  Psychiatric:        Mood and Affect: Mood normal.        Behavior: Behavior normal.        Thought Content: Thought content normal.        Judgment: Judgment normal.      Lab Results:  CBC    Component Value Date/Time   WBC 9.4 05/27/2019 0155   RBC 4.73 05/27/2019 0155   HGB 14.0 05/27/2019 0155   HCT 42.8 05/27/2019 0155   PLT 258 05/27/2019 0155   MCV 90.5 05/27/2019 0155   MCH 29.6 05/27/2019 0155   MCHC 32.7 05/27/2019 0155   RDW 13.7 05/27/2019 0155   LYMPHSABS 0.5 (L) 05/27/2019 0155   MONOABS 0.5 05/27/2019 0155   EOSABS 0.0 05/27/2019 0155   BASOSABS 0.0 05/27/2019 0155    BMET    Component Value Date/Time   NA 135 05/27/2019 0155   K 4.8 05/27/2019 0155   CL 103 05/27/2019 0155   CO2 23 05/27/2019 0155   GLUCOSE 111 (H) 05/27/2019 0155   BUN 33 (H) 05/27/2019 0155   CREATININE 0.90 05/27/2019 0155   CALCIUM 8.9 05/27/2019 0155   GFRNONAA >60 05/27/2019 0155   GFRAA >60 05/27/2019 0155    BNP No results found for: BNP  ProBNP No results found for: PROBNP  Imaging: No results found.   Assessment & Plan:   NSIP (nonspecific interstitial pneumonia) ([place]) - Breathing is mostly stable; some mild dyspnea on exertion, patient really wants to come off oral steriod  - Alternative 20mg  and 10mg  every other day  - Continue Bactrim MWF - Use albuterol inahler 2 puffs every 6 hours as needed  for shortness of breath - Recommend light cardiovascular exercise 39min 3-4 times a week  - CT chest is due to be repeated in June  - Follow-up: Televisit with Beth or [name] in 2 weeks; sooner if breathing worsens      Martyn Ehrich, NP 12/09/2019

## 2019-12-05 NOTE — Patient Instructions (Signed)
Recommendations: - Alternative 20mg  and 10mg  every other day  - Continue Bactrim MWF - Use albuterol inahler 2 puffs every 6 hours as needed for shortness of breath - Increase physical exercise some- recommend light cardiovascular exercise/walking 9min 3-4 times a week   Follow-up: - Televisit with Beth or [name] in 2 weeks; sooner if breathing worsens  - CT chest is due to be repeated in June (already ordered but no date scheduled yet)

## 2019-12-09 DIAGNOSIS — R262 Difficulty in walking, not elsewhere classified: Secondary | ICD-10-CM | POA: Diagnosis not present

## 2019-12-09 DIAGNOSIS — M545 Low back pain: Secondary | ICD-10-CM | POA: Diagnosis not present

## 2019-12-09 DIAGNOSIS — Z96643 Presence of artificial hip joint, bilateral: Secondary | ICD-10-CM | POA: Diagnosis not present

## 2019-12-09 DIAGNOSIS — M25559 Pain in unspecified hip: Secondary | ICD-10-CM | POA: Diagnosis not present

## 2019-12-09 NOTE — Assessment & Plan Note (Signed)
-   Breathing is mostly stable; some mild dyspnea on exertion, patient really wants to come off oral steriod  - Alternative 20mg  and 10mg  every other day  - Continue Bactrim MWF - Use albuterol inahler 2 puffs every 6 hours as needed for shortness of breath - Recommend light cardiovascular exercise 36min 3-4 times a week  - CT chest is due to be repeated in June  - Follow-up: Televisit with Beth or [name] in 2 weeks; sooner if breathing worsens

## 2019-12-11 DIAGNOSIS — R262 Difficulty in walking, not elsewhere classified: Secondary | ICD-10-CM | POA: Diagnosis not present

## 2019-12-11 DIAGNOSIS — Z23 Encounter for immunization: Secondary | ICD-10-CM | POA: Diagnosis not present

## 2019-12-11 DIAGNOSIS — Z96643 Presence of artificial hip joint, bilateral: Secondary | ICD-10-CM | POA: Diagnosis not present

## 2019-12-11 DIAGNOSIS — M25559 Pain in unspecified hip: Secondary | ICD-10-CM | POA: Diagnosis not present

## 2019-12-11 DIAGNOSIS — M545 Low back pain: Secondary | ICD-10-CM | POA: Diagnosis not present

## 2019-12-16 DIAGNOSIS — Z96643 Presence of artificial hip joint, bilateral: Secondary | ICD-10-CM | POA: Diagnosis not present

## 2019-12-16 DIAGNOSIS — R262 Difficulty in walking, not elsewhere classified: Secondary | ICD-10-CM | POA: Diagnosis not present

## 2019-12-16 DIAGNOSIS — M545 Low back pain: Secondary | ICD-10-CM | POA: Diagnosis not present

## 2019-12-16 DIAGNOSIS — M25559 Pain in unspecified hip: Secondary | ICD-10-CM | POA: Diagnosis not present

## 2019-12-23 DIAGNOSIS — R262 Difficulty in walking, not elsewhere classified: Secondary | ICD-10-CM | POA: Diagnosis not present

## 2019-12-23 DIAGNOSIS — Z96643 Presence of artificial hip joint, bilateral: Secondary | ICD-10-CM | POA: Diagnosis not present

## 2019-12-23 DIAGNOSIS — M545 Low back pain: Secondary | ICD-10-CM | POA: Diagnosis not present

## 2019-12-23 DIAGNOSIS — M25559 Pain in unspecified hip: Secondary | ICD-10-CM | POA: Diagnosis not present

## 2019-12-26 DIAGNOSIS — R262 Difficulty in walking, not elsewhere classified: Secondary | ICD-10-CM | POA: Diagnosis not present

## 2019-12-26 DIAGNOSIS — M25559 Pain in unspecified hip: Secondary | ICD-10-CM | POA: Diagnosis not present

## 2019-12-26 DIAGNOSIS — M545 Low back pain: Secondary | ICD-10-CM | POA: Diagnosis not present

## 2019-12-26 DIAGNOSIS — Z96643 Presence of artificial hip joint, bilateral: Secondary | ICD-10-CM | POA: Diagnosis not present

## 2019-12-26 NOTE — Telephone Encounter (Signed)
Called and spoke with patient. He is now scheduled to see Beth on 4/15 at 9:45am. Nothing further needed at this time.

## 2019-12-27 ENCOUNTER — Other Ambulatory Visit: Payer: Self-pay | Admitting: Cardiology

## 2019-12-27 DIAGNOSIS — R0609 Other forms of dyspnea: Secondary | ICD-10-CM

## 2019-12-27 DIAGNOSIS — I712 Thoracic aortic aneurysm, without rupture, unspecified: Secondary | ICD-10-CM

## 2019-12-27 DIAGNOSIS — R0600 Dyspnea, unspecified: Secondary | ICD-10-CM

## 2019-12-27 DIAGNOSIS — I10 Essential (primary) hypertension: Secondary | ICD-10-CM

## 2019-12-27 DIAGNOSIS — I2510 Atherosclerotic heart disease of native coronary artery without angina pectoris: Secondary | ICD-10-CM

## 2019-12-27 DIAGNOSIS — E7800 Pure hypercholesterolemia, unspecified: Secondary | ICD-10-CM

## 2019-12-29 ENCOUNTER — Telehealth: Payer: Self-pay | Admitting: *Deleted

## 2019-12-29 DIAGNOSIS — Z5181 Encounter for therapeutic drug level monitoring: Secondary | ICD-10-CM

## 2019-12-29 DIAGNOSIS — E7800 Pure hypercholesterolemia, unspecified: Secondary | ICD-10-CM

## 2019-12-29 DIAGNOSIS — I10 Essential (primary) hypertension: Secondary | ICD-10-CM

## 2019-12-29 MED ORDER — ROSUVASTATIN CALCIUM 20 MG PO TABS: 20.0000 mg | ORAL_TABLET | Freq: Every day | ORAL | 1 refills | Status: DC

## 2019-12-29 NOTE — Telephone Encounter (Signed)
Advised patient, verbalized understanding  mychart message below    December 27, 2019 Lelon Perla, MD to Cristopher Estimable, RN      9:43 AM Decrease crestor to 20 mg daily, follow for side effects, lipids and liver 12 weeks  Kirk Ruths  December 26, 2019     2:09 PM Thompson Grayer, RN routed this conversation to Lelon Perla, MD . Cristopher Estimable, RN  [name] "[facility]" to Lelon Perla, MD    12:10 PM Dr. Stanford Breed / Hermenia Bers, so I'll plan to see him in July. In the meanwhile, I was wondering what the chances were of getting a lower dose or different Rx for treating my cholesterol? My recent lab work looked good that I had done with [facility] (Dr. Antony Contras) and I'm having numerous side effects, such as itching, cramping, more back and hip arthritic pain than usual, and I am wondering if the 40mg  of Rosuvastatin in combination with the Prednisone may be causing some of these symptoms? Please advise. Thank you, Ruthann Cancer

## 2019-12-30 DIAGNOSIS — R7309 Other abnormal glucose: Secondary | ICD-10-CM | POA: Diagnosis not present

## 2019-12-31 DIAGNOSIS — M25559 Pain in unspecified hip: Secondary | ICD-10-CM | POA: Diagnosis not present

## 2019-12-31 DIAGNOSIS — M545 Low back pain: Secondary | ICD-10-CM | POA: Diagnosis not present

## 2019-12-31 DIAGNOSIS — Z96643 Presence of artificial hip joint, bilateral: Secondary | ICD-10-CM | POA: Diagnosis not present

## 2019-12-31 DIAGNOSIS — R262 Difficulty in walking, not elsewhere classified: Secondary | ICD-10-CM | POA: Diagnosis not present

## 2020-01-01 ENCOUNTER — Other Ambulatory Visit: Payer: Self-pay

## 2020-01-01 ENCOUNTER — Encounter: Payer: Self-pay | Admitting: Primary Care

## 2020-01-01 ENCOUNTER — Ambulatory Visit (INDEPENDENT_AMBULATORY_CARE_PROVIDER_SITE_OTHER): Payer: Medicare Other | Admitting: Primary Care

## 2020-01-01 VITALS — BP 142/70 | HR 57 | Temp 97.4°F | Ht 72.5 in | Wt 276.0 lb

## 2020-01-01 DIAGNOSIS — J8489 Other specified interstitial pulmonary diseases: Secondary | ICD-10-CM

## 2020-01-01 DIAGNOSIS — Z96643 Presence of artificial hip joint, bilateral: Secondary | ICD-10-CM | POA: Diagnosis not present

## 2020-01-01 DIAGNOSIS — Z471 Aftercare following joint replacement surgery: Secondary | ICD-10-CM | POA: Diagnosis not present

## 2020-01-01 NOTE — Patient Instructions (Addendum)
Plan: Decrease prednisone to 5mg  daily (stay on this done until CT chest if possible, if you notice a change in your breathing please go back to 10mg  daily)  Orders: CT chest wo contrast in June (our patient care coordination will call you one moth before to set up date)   Pulmonary Fibrosis  Pulmonary fibrosis is a type of lung disease that causes scarring. Over time, the scar tissue builds up in the air sacs of your lungs (alveoli). This makes it hard for you to breathe. Less oxygen can get into your blood. Scarring from pulmonary fibrosis gets worse over time. This damage is permanent and may lead to other serious health problems. What are the causes? There are many different causes of pulmonary fibrosis. Sometimes the cause is not known. This is called idiopathic pulmonary fibrosis. Other causes include:  Exposure to chemicals and substances found in agricultural, farm, Architect, or factory work. These include mold, asbestos, silica, metal dusts, and toxic fumes.  Sarcoidosis. In this disease, areas of inflammatory cells (granulomas) form and most often affect the lungs.  Autoimmune diseases. These include diseases such as rheumatoid arthritis, systemic sclerosis, or connective tissue disease.  Taking certain medicines. These include drugs used in radiation therapy or used to treat seizures, heart problems, and some infections. What increases the risk? You are more likely to develop this condition if:  You have a family history of the disease.  You are older. The condition is more common in older adults.  You have a history of smoking.  You have a job that exposes you to certain chemicals.  You have gastroesophageal reflux disease (GERD). What are the signs or symptoms? Symptoms of this condition include:  Difficulty breathing that gets worse with activity.  Shortness of breath (dyspnea).  Dry, hacking cough.  Rapid, shallow breathing during exercise or while at  rest.  Bluish skin and lips.  Loss of appetite.  Weakness.  Weight loss and fatigue.  Rounded and enlarged fingertips (clubbing). How is this diagnosed? This condition may be diagnosed based on:  Your symptoms and medical history.  A physical exam. You may also have tests, including:  A test that involves looking inside your lungs with an instrument (bronchoscopy).  Imaging studies of your lungs and heart.  Tests to measure how well you are breathing (pulmonary function tests).  Blood tests.  Tests to see how well your lungs work while you are walking (pulmonary stress test).  A procedure to remove a lung tissue sample to look at it under a microscope (biopsy). How is this treated? There is no cure for pulmonary fibrosis. Treatment focuses on managing symptoms and preventing scarring from getting worse. This may include:  Medicines, such as: ? Steroids to prevent permanent lung changes. ? Medicines to suppress your body's defense system (immune system). ? Medicines to help with lung function by reducing inflammation or scarring.  Ongoing monitoring with X-rays and lab work.  Oxygen therapy.  Pulmonary rehabilitation.  Surgery. In some cases, a lung transplant is possible. Follow these instructions at home:     Medicines  Take over-the-counter and prescription medicines only as told by your health care provider.  Keep your vaccinations up to date as recommended by your health care provider. General instructions  Do not use any products that contain nicotine or tobacco, such as cigarettes and e-cigarettes. If you need help quitting, ask your health care provider.  Get regular exercise, but do not overexert yourself. Ask your health care provider  to suggest some activities that are safe for you to do. ? If you have physical limitations, you may get exercise by walking, using a stationary bike, or doing chair exercises. ? Ask your health care provider about  using oxygen while exercising.  If you are exposed to chemicals and substances at work, make sure that you wear a mask or respirator at all times.  Join a pulmonary rehabilitation program or a support group for people with pulmonary fibrosis.  Eat small meals often so you do not get too full. Overeating can make breathing trouble worse.  Maintain a healthy weight. Lose weight if you need to.  Do breathing exercises as directed by your health care provider.  Keep all follow-up visits as told by your health care provider. This is important. Contact a health care provider if you:  Have symptoms that do not get better with medicines.  Are not able to be as active as usual.  Have trouble taking a deep breath.  Have a fever or chills.  Have blue lips or skin.  Have clubbing of your fingers. Get help right away if you:  Have a sudden worsening of your symptoms.  Have chest pain.  Cough up mucus that is dark in color.  Have a lot of headaches.  Get very confused or sleepy. Summary  Pulmonary fibrosis is a type of lung disease that causes scar tissue to build up in the air sacs of your lungs (alveoli) over time. Less oxygen can get into your blood. This makes it hard for you to breathe.  Scarring from pulmonary fibrosis gets worse over time. This damage is permanent and may lead to other serious health problems.  You are more likely to develop this condition if you have a family history of the condition or a job that exposes you to certain chemicals.  There is no cure for pulmonary fibrosis. Treatment focuses on managing symptoms and preventing scarring from getting worse. This information is not intended to replace advice given to you by your health care provider. Make sure you discuss any questions you have with your health care provider. Document Revised: 10/10/2017 Document Reviewed: 10/10/2017 Elsevier Patient Education  2020 [employer].

## 2020-01-01 NOTE — Progress Notes (Signed)
@Patient  ID: Colin [name], male    DOB: 08-19-1951, 69 y.o.   MRN: [id number]  Chief Complaint  Patient presents with  . Follow-up    Sob-same,no cough or wheeze, Wants to discuss decreasing Prednisone    Referring provider: Antony Contras, MD  HPI: 69 year old male, never smoke. PMH significant for NSIP, hypertension, GERD. Patient of [name]. Hospitalized with COVID-19 pneumonia in September. Parenchymal changes on CT. Autoimmune work-up negative. He was started on steroids in November 2020, initial improvement with some recurrent shortness of breath with tapper.   Previous [facility] pulmonary encounter: 12/05/2019 Patient presents today for 2 months follow-up. He is doing well, breathing is mostly stable. He would like to taper off prednisone. He does reports some increased dyspnea when he was originally tapered from 50mg  of prednisone. He has been taking 30mg  and 20mg  every other day for the last 8 weeks and Bactrium DS MWF. He is due for repeat CT chest in June. He has some cough but reports that it is better than it was 2 weeks ago. He has only required his rescue inhaler once in the last several months. He does reports difficulty falling asleep every 4 days or so, states that he will take bendaryl and he is basically to re-charged.   01/01/2020 Patient presents today for 1 month follow-up/ He is doing well. His breathing is unchanged. He has not noticed a significant difference with prednisone taper. He would like to come off prednisone if possible. He is currently taking prednisone 20mg  alternating with 10mg  daily. He was having difficulty sleeping with higher steroid dose, this has improved. He reports rash, back pain and decreased sex drive while on prednisone. He continues Bactrim MWF. He has not needed to use albuterol rescue inhaler in a long times. He is not as physically active as he would like d/t chronic hip pain, he is going to physical therapy. Uses a cane to walk.   No Known  Allergies  Immunization History  Administered Date(s) Administered  . DTaP 02/15/2010  . Influenza Split 07/24/2012  . Influenza, High Dose Seasonal PF 06/16/2019  . PFIZER SARS-COV-2 Vaccination 12/11/2019  . Pneumococcal Conjugate-13 04/30/2018  . Pneumococcal Polysaccharide-23 06/18/2019    Past Medical History:  Diagnosis Date  . Arthritis   . Bowel trouble    Stool leakage since colonscopy 02/2016  . Cancer ([place])    skin cancer  . Carpal tunnel syndrome, bilateral   . Depression    in teenage years   . GERD (gastroesophageal reflux disease)   . Hyperlipemia   . Hypertension   . Hypothyroidism   . Interstitial lung disease ([place])   . Pneumonia   . Shortness of breath dyspnea    with bending over   . Wears glasses     Tobacco History: Social History   Tobacco Use  Smoking Status Never Smoker  Smokeless Tobacco Never Used   Counseling given: Not Answered   Outpatient Medications Prior to Visit  Medication Sig Dispense Refill  . albuterol (VENTOLIN HFA) 108 (90 Base) MCG/ACT inhaler Inhale 2 puffs into the lungs every 6 (six) hours as needed for wheezing or shortness of breath. 6.7 g 6  . aspirin EC 81 MG tablet Take 1 tablet (81 mg total) by mouth daily. 90 tablet 3  . cetirizine (ZYRTEC) 10 MG tablet Take 10 mg by mouth daily as needed for allergies.    Marland Kitchen levothyroxine (SYNTHROID) 75 MCG tablet Take 75 mcg by mouth daily  before breakfast.    . losartan-hydrochlorothiazide (HYZAAR) 50-12.5 MG per tablet Take 1 tablet by mouth daily.    . Omega-3 Fatty Acids (FISH OIL) 1000 MG CAPS Take by mouth.    Marland Kitchen omeprazole (PRILOSEC) 40 MG capsule Take 40 mg by mouth daily.    . predniSONE (DELTASONE) 10 MG tablet Alternate 20mg  and 10mg  every other day until next visit 60 tablet 0  . rosuvastatin (CRESTOR) 20 MG tablet Take 1 tablet (20 mg total) by mouth daily. (Patient taking differently: Take 20 mg by mouth daily. Take 1/2 tablet daily) 90 tablet 1  .  sulfamethoxazole-trimethoprim (BACTRIM DS) 800-160 MG tablet 1 tab Mon., Wed., Fri. 36 tablet 0  . temazepam (RESTORIL) 15 MG capsule 1-2 tablets nightly as needed 60 capsule 0  . atorvastatin (LIPITOR) 80 MG tablet Please specify directions, refills and quantity (Patient not taking: Reported on 01/01/2020) 90 tablet 0   No facility-administered medications prior to visit.    Review of Systems  Review of Systems  Respiratory: Negative for cough and wheezing.        Dyspnea  Musculoskeletal: Positive for back pain.       Hip pain  Skin: Positive for rash.    Physical Exam  BP (!) 142/70 (BP Location: Left Arm, Cuff Size: Large)   Pulse (!) 57   Temp (!) 97.4 F (36.3 C) (Temporal)   Ht 6' 0.5" (1.842 m)   Wt 276 lb (125.2 kg)   SpO2 97%   BMI 36.92 kg/m  Physical Exam Constitutional:      Appearance: Normal appearance.  HENT:     Head: Normocephalic and atraumatic.     Mouth/Throat:     Mouth: Mucous membranes are moist.     Pharynx: Oropharynx is clear.  Cardiovascular:     Rate and Rhythm: Normal rate and regular rhythm.     Comments: Trace BLE edema Pulmonary:     Effort: Pulmonary effort is normal.     Breath sounds: Rales present.     Comments: Fine crackles to bases Musculoskeletal:     Comments: Ambulates with cane  Neurological:     General: No focal deficit present.     Mental Status: He is alert and oriented to person, place, and time. Mental status is at baseline.  Psychiatric:        Mood and Affect: Mood normal.        Behavior: Behavior normal.        Thought Content: Thought content normal.        Judgment: Judgment normal.      Lab Results:  CBC    Component Value Date/Time   WBC 9.4 05/27/2019 0155   RBC 4.73 05/27/2019 0155   HGB 14.0 05/27/2019 0155   HCT 42.8 05/27/2019 0155   PLT 258 05/27/2019 0155   MCV 90.5 05/27/2019 0155   MCH 29.6 05/27/2019 0155   MCHC 32.7 05/27/2019 0155   RDW 13.7 05/27/2019 0155   LYMPHSABS 0.5 (L)  05/27/2019 0155   MONOABS 0.5 05/27/2019 0155   EOSABS 0.0 05/27/2019 0155   BASOSABS 0.0 05/27/2019 0155    BMET    Component Value Date/Time   NA 135 05/27/2019 0155   K 4.8 05/27/2019 0155   CL 103 05/27/2019 0155   CO2 23 05/27/2019 0155   GLUCOSE 111 (H) 05/27/2019 0155   BUN 33 (H) 05/27/2019 0155   CREATININE 0.90 05/27/2019 0155   CALCIUM 8.9 05/27/2019 0155   GFRNONAA >60  05/27/2019 0155   GFRAA >60 05/27/2019 0155    BNP No results found for: BNP  ProBNP No results found for: PROBNP  Imaging: No results found.   Assessment & Plan:   NSIP (nonspecific interstitial pneumonia) ([place]) - Prior COVID-19 pneumonia September 2020. Started on Steroids in November. Initial improvement with some recurrent shortness of breath with prednisone taper. His goal is to come of steriods.  - 01/01/2020 Remains stable; no increase in dyspnea with most recent prednisone taper on 3/19 - Decreased prednisone to 5mg  daily (if shortness of breath worsen increase back to 10mg ) - Continue Bactrim MWF; prn albuterol 2 puffs every 4-6 hours for shortness of breath - Encourage light cardiovascular activity 3 days a week  - Needs repeat CT chest wo contrast in June 2021   FU after imaging in 2 months   Martyn Ehrich, NP 01/01/2020

## 2020-01-01 NOTE — Assessment & Plan Note (Addendum)
-   Prior COVID-19 pneumonia September 2020. Started on Steroids in November. Initial improvement with some recurrent shortness of breath with prednisone taper. His goal is to come of steriods.  - 01/01/2020 Remains stable; no increase in dyspnea with most recent prednisone taper on 3/19 - Decreased prednisone to 5mg  daily (if shortness of breath worsen increase back to 10mg ) - Continue Bactrim MWF; prn albuterol 2 puffs every 4-6 hours for shortness of breath - Encourage light cardiovascular activity 3 days a week  - Needs repeat CT chest wo contrast in June 2021

## 2020-01-02 DIAGNOSIS — R262 Difficulty in walking, not elsewhere classified: Secondary | ICD-10-CM | POA: Diagnosis not present

## 2020-01-02 DIAGNOSIS — Z96643 Presence of artificial hip joint, bilateral: Secondary | ICD-10-CM | POA: Diagnosis not present

## 2020-01-02 DIAGNOSIS — M25559 Pain in unspecified hip: Secondary | ICD-10-CM | POA: Diagnosis not present

## 2020-01-02 DIAGNOSIS — M545 Low back pain: Secondary | ICD-10-CM | POA: Diagnosis not present

## 2020-01-05 DIAGNOSIS — R262 Difficulty in walking, not elsewhere classified: Secondary | ICD-10-CM | POA: Diagnosis not present

## 2020-01-05 DIAGNOSIS — Z96643 Presence of artificial hip joint, bilateral: Secondary | ICD-10-CM | POA: Diagnosis not present

## 2020-01-05 DIAGNOSIS — M545 Low back pain: Secondary | ICD-10-CM | POA: Diagnosis not present

## 2020-01-05 DIAGNOSIS — M25559 Pain in unspecified hip: Secondary | ICD-10-CM | POA: Diagnosis not present

## 2020-01-07 DIAGNOSIS — M25559 Pain in unspecified hip: Secondary | ICD-10-CM | POA: Diagnosis not present

## 2020-01-07 DIAGNOSIS — M545 Low back pain: Secondary | ICD-10-CM | POA: Diagnosis not present

## 2020-01-07 DIAGNOSIS — R262 Difficulty in walking, not elsewhere classified: Secondary | ICD-10-CM | POA: Diagnosis not present

## 2020-01-07 DIAGNOSIS — Z96643 Presence of artificial hip joint, bilateral: Secondary | ICD-10-CM | POA: Diagnosis not present

## 2020-01-09 DIAGNOSIS — M25559 Pain in unspecified hip: Secondary | ICD-10-CM | POA: Diagnosis not present

## 2020-01-09 DIAGNOSIS — Z96643 Presence of artificial hip joint, bilateral: Secondary | ICD-10-CM | POA: Diagnosis not present

## 2020-01-09 DIAGNOSIS — R262 Difficulty in walking, not elsewhere classified: Secondary | ICD-10-CM | POA: Diagnosis not present

## 2020-01-09 DIAGNOSIS — M545 Low back pain: Secondary | ICD-10-CM | POA: Diagnosis not present

## 2020-01-12 DIAGNOSIS — Z96643 Presence of artificial hip joint, bilateral: Secondary | ICD-10-CM | POA: Diagnosis not present

## 2020-01-12 DIAGNOSIS — R262 Difficulty in walking, not elsewhere classified: Secondary | ICD-10-CM | POA: Diagnosis not present

## 2020-01-12 DIAGNOSIS — M25559 Pain in unspecified hip: Secondary | ICD-10-CM | POA: Diagnosis not present

## 2020-01-12 DIAGNOSIS — M545 Low back pain: Secondary | ICD-10-CM | POA: Diagnosis not present

## 2020-01-14 DIAGNOSIS — M545 Low back pain: Secondary | ICD-10-CM | POA: Diagnosis not present

## 2020-01-14 DIAGNOSIS — Z96643 Presence of artificial hip joint, bilateral: Secondary | ICD-10-CM | POA: Diagnosis not present

## 2020-01-14 DIAGNOSIS — M25559 Pain in unspecified hip: Secondary | ICD-10-CM | POA: Diagnosis not present

## 2020-01-14 DIAGNOSIS — R262 Difficulty in walking, not elsewhere classified: Secondary | ICD-10-CM | POA: Diagnosis not present

## 2020-01-15 DIAGNOSIS — M25511 Pain in right shoulder: Secondary | ICD-10-CM | POA: Diagnosis not present

## 2020-01-16 DIAGNOSIS — Z96643 Presence of artificial hip joint, bilateral: Secondary | ICD-10-CM | POA: Diagnosis not present

## 2020-01-16 DIAGNOSIS — R262 Difficulty in walking, not elsewhere classified: Secondary | ICD-10-CM | POA: Diagnosis not present

## 2020-01-16 DIAGNOSIS — M25559 Pain in unspecified hip: Secondary | ICD-10-CM | POA: Diagnosis not present

## 2020-01-16 DIAGNOSIS — M545 Low back pain: Secondary | ICD-10-CM | POA: Diagnosis not present

## 2020-01-19 DIAGNOSIS — M545 Low back pain: Secondary | ICD-10-CM | POA: Diagnosis not present

## 2020-01-19 DIAGNOSIS — Z96643 Presence of artificial hip joint, bilateral: Secondary | ICD-10-CM | POA: Diagnosis not present

## 2020-01-19 DIAGNOSIS — M25559 Pain in unspecified hip: Secondary | ICD-10-CM | POA: Diagnosis not present

## 2020-01-19 DIAGNOSIS — R262 Difficulty in walking, not elsewhere classified: Secondary | ICD-10-CM | POA: Diagnosis not present

## 2020-01-20 DIAGNOSIS — M25511 Pain in right shoulder: Secondary | ICD-10-CM | POA: Diagnosis not present

## 2020-01-21 DIAGNOSIS — M75121 Complete rotator cuff tear or rupture of right shoulder, not specified as traumatic: Secondary | ICD-10-CM | POA: Diagnosis not present

## 2020-01-21 DIAGNOSIS — M19019 Primary osteoarthritis, unspecified shoulder: Secondary | ICD-10-CM | POA: Diagnosis not present

## 2020-01-21 DIAGNOSIS — R262 Difficulty in walking, not elsewhere classified: Secondary | ICD-10-CM | POA: Diagnosis not present

## 2020-01-21 DIAGNOSIS — M545 Low back pain: Secondary | ICD-10-CM | POA: Diagnosis not present

## 2020-01-21 DIAGNOSIS — Z96643 Presence of artificial hip joint, bilateral: Secondary | ICD-10-CM | POA: Diagnosis not present

## 2020-01-21 DIAGNOSIS — M25559 Pain in unspecified hip: Secondary | ICD-10-CM | POA: Diagnosis not present

## 2020-01-21 DIAGNOSIS — M19011 Primary osteoarthritis, right shoulder: Secondary | ICD-10-CM | POA: Diagnosis not present

## 2020-01-23 DIAGNOSIS — R262 Difficulty in walking, not elsewhere classified: Secondary | ICD-10-CM | POA: Diagnosis not present

## 2020-01-23 DIAGNOSIS — M545 Low back pain: Secondary | ICD-10-CM | POA: Diagnosis not present

## 2020-01-23 DIAGNOSIS — Z96643 Presence of artificial hip joint, bilateral: Secondary | ICD-10-CM | POA: Diagnosis not present

## 2020-01-23 DIAGNOSIS — M25559 Pain in unspecified hip: Secondary | ICD-10-CM | POA: Diagnosis not present

## 2020-01-26 DIAGNOSIS — Z96643 Presence of artificial hip joint, bilateral: Secondary | ICD-10-CM | POA: Diagnosis not present

## 2020-01-26 DIAGNOSIS — M545 Low back pain: Secondary | ICD-10-CM | POA: Diagnosis not present

## 2020-01-26 DIAGNOSIS — M25559 Pain in unspecified hip: Secondary | ICD-10-CM | POA: Diagnosis not present

## 2020-01-26 DIAGNOSIS — R262 Difficulty in walking, not elsewhere classified: Secondary | ICD-10-CM | POA: Diagnosis not present

## 2020-01-28 DIAGNOSIS — R262 Difficulty in walking, not elsewhere classified: Secondary | ICD-10-CM | POA: Diagnosis not present

## 2020-01-28 DIAGNOSIS — M25559 Pain in unspecified hip: Secondary | ICD-10-CM | POA: Diagnosis not present

## 2020-01-28 DIAGNOSIS — Z96643 Presence of artificial hip joint, bilateral: Secondary | ICD-10-CM | POA: Diagnosis not present

## 2020-01-28 DIAGNOSIS — M545 Low back pain: Secondary | ICD-10-CM | POA: Diagnosis not present

## 2020-02-01 IMAGING — CR DG CHEST 2V
2 series · 2 of 2 positions shown · non-contrast
Comparison: 05/18/2019

CLINICAL DATA: Cough for 6 weeks.

EXAM:
CHEST - 2 VIEW

[w chest pa]
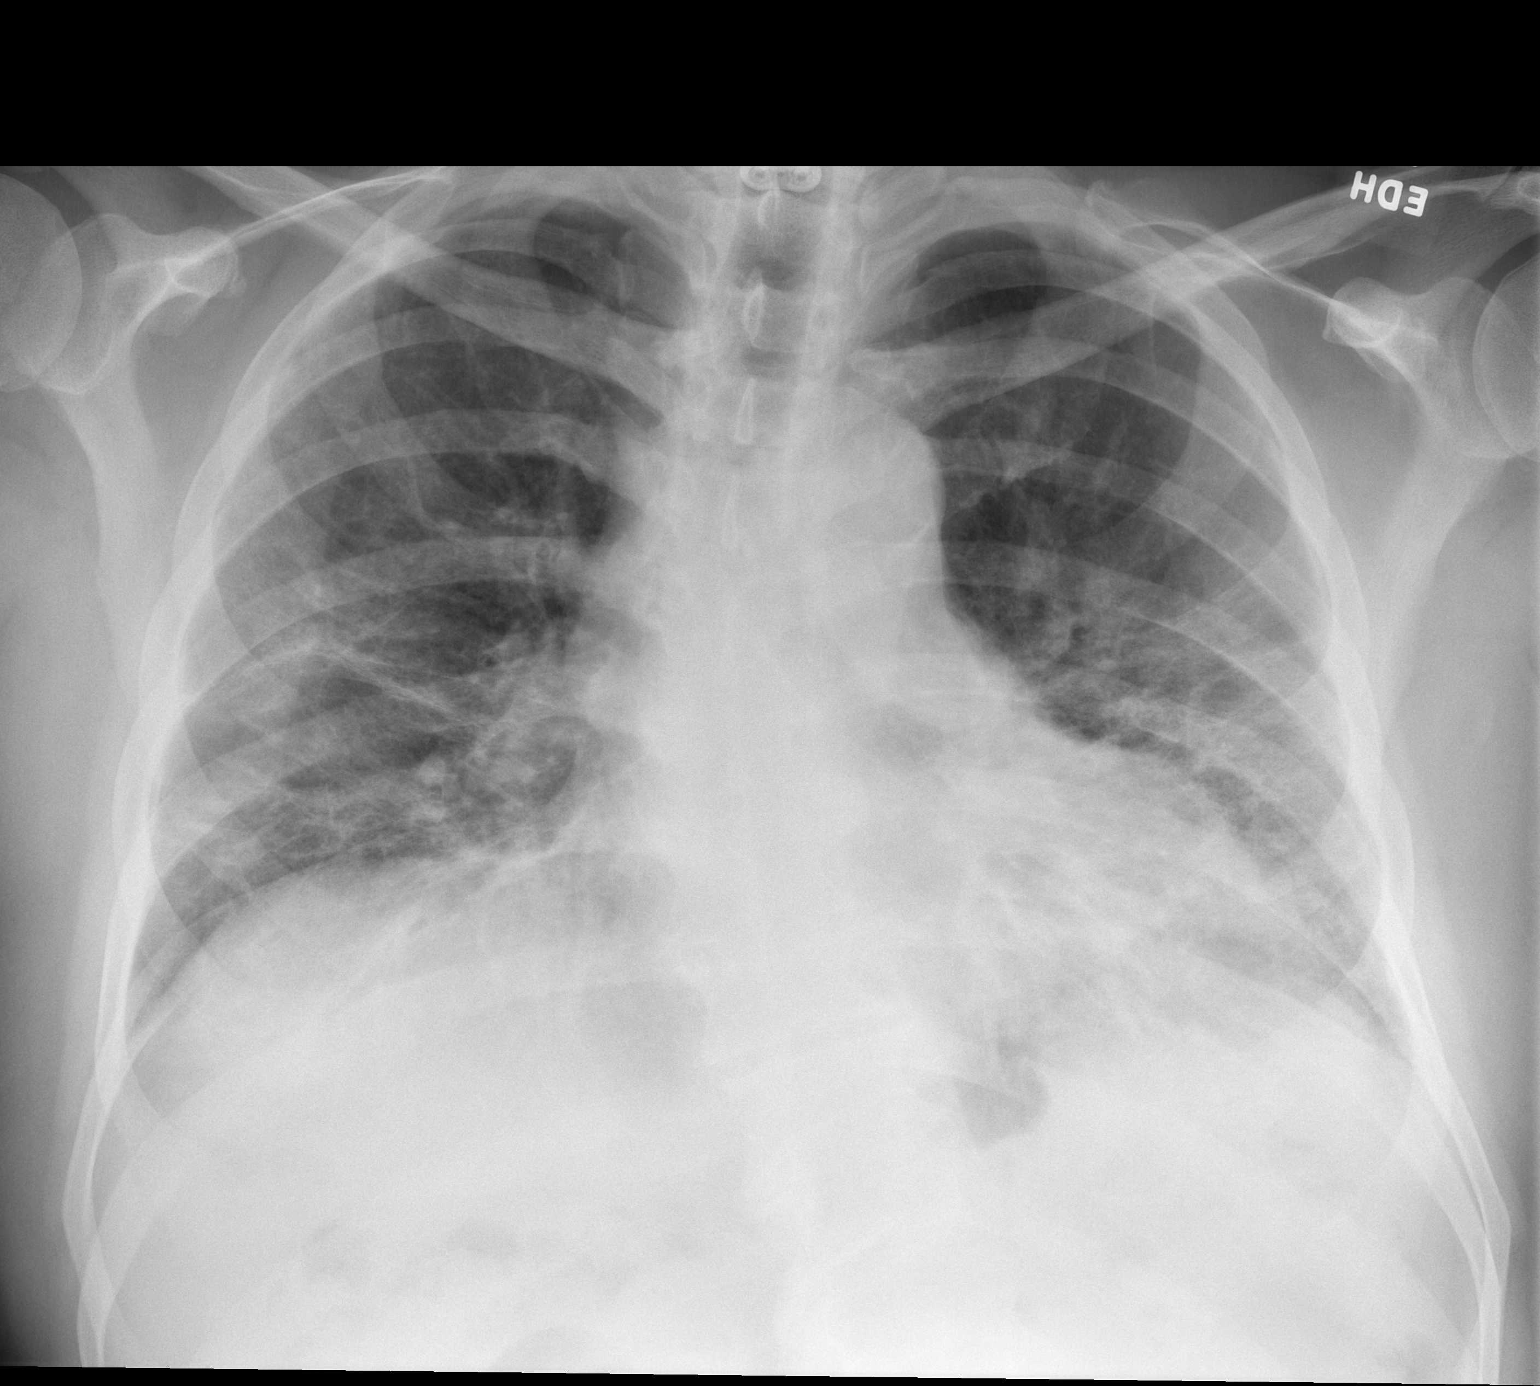

[w chest lat]
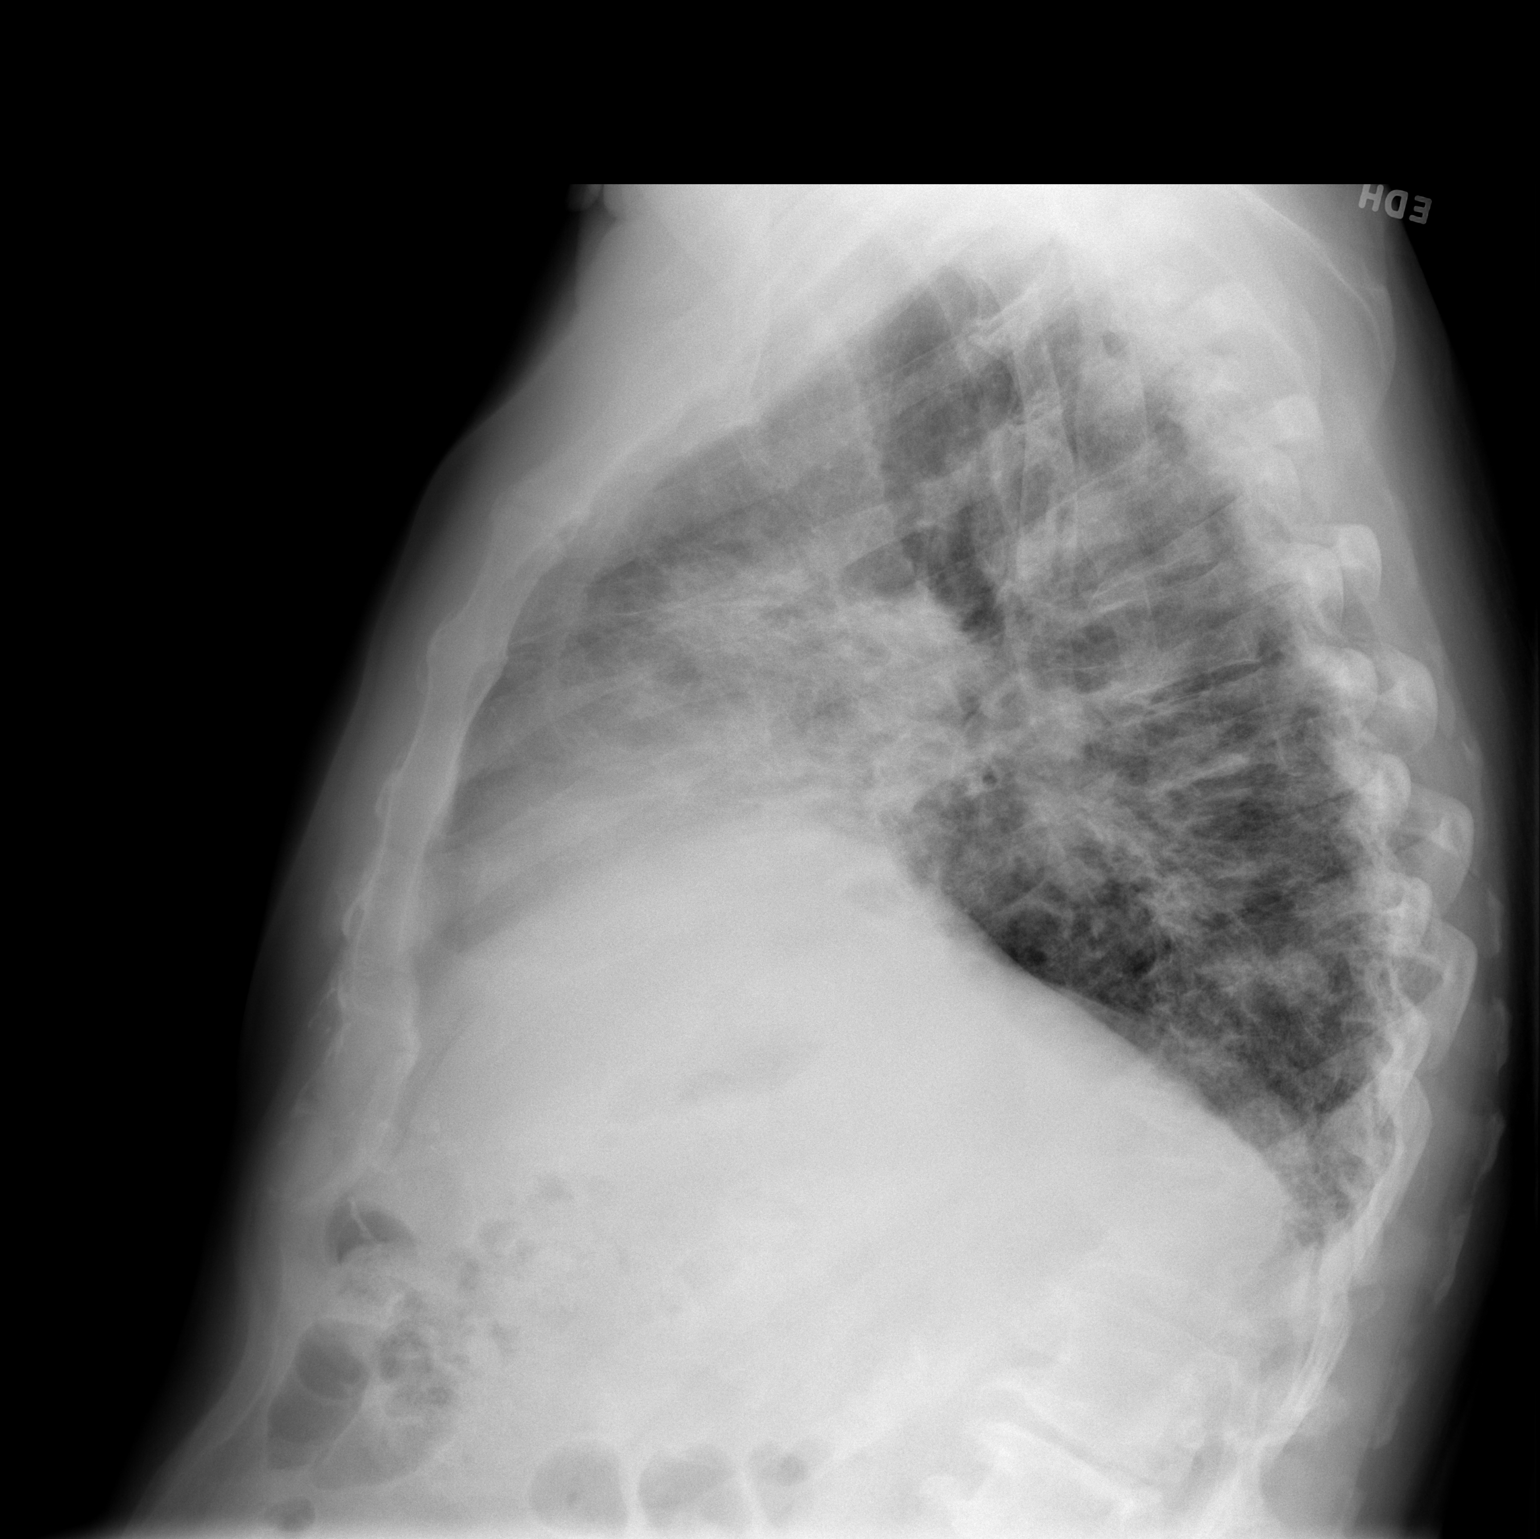

[2 of 2 positions shown; findings below may reference images not displayed]

FINDINGS: Stable cardiomediastinal contours. Lung volumes are low. Diffuse
multifocal airspace opacities throughout both lungs, slightly
progressed from prior. No pleural effusion. No pneumothorax.
IMPRESSION: Findings of multifocal pneumonia, progressed from prior.

## 2020-02-02 DIAGNOSIS — Z96643 Presence of artificial hip joint, bilateral: Secondary | ICD-10-CM | POA: Diagnosis not present

## 2020-02-02 DIAGNOSIS — M545 Low back pain: Secondary | ICD-10-CM | POA: Diagnosis not present

## 2020-02-02 DIAGNOSIS — R262 Difficulty in walking, not elsewhere classified: Secondary | ICD-10-CM | POA: Diagnosis not present

## 2020-02-02 DIAGNOSIS — M25559 Pain in unspecified hip: Secondary | ICD-10-CM | POA: Diagnosis not present

## 2020-02-04 DIAGNOSIS — M25559 Pain in unspecified hip: Secondary | ICD-10-CM | POA: Diagnosis not present

## 2020-02-04 DIAGNOSIS — Z96643 Presence of artificial hip joint, bilateral: Secondary | ICD-10-CM | POA: Diagnosis not present

## 2020-02-04 DIAGNOSIS — R262 Difficulty in walking, not elsewhere classified: Secondary | ICD-10-CM | POA: Diagnosis not present

## 2020-02-04 DIAGNOSIS — M545 Low back pain: Secondary | ICD-10-CM | POA: Diagnosis not present

## 2020-02-09 ENCOUNTER — Telehealth: Payer: Self-pay | Admitting: Adult Health

## 2020-02-09 ENCOUNTER — Encounter: Payer: Self-pay | Admitting: Adult Health

## 2020-02-09 DIAGNOSIS — M545 Low back pain: Secondary | ICD-10-CM | POA: Diagnosis not present

## 2020-02-09 DIAGNOSIS — R262 Difficulty in walking, not elsewhere classified: Secondary | ICD-10-CM | POA: Diagnosis not present

## 2020-02-09 DIAGNOSIS — M25559 Pain in unspecified hip: Secondary | ICD-10-CM | POA: Diagnosis not present

## 2020-02-09 DIAGNOSIS — Z96643 Presence of artificial hip joint, bilateral: Secondary | ICD-10-CM | POA: Diagnosis not present

## 2020-02-09 NOTE — Telephone Encounter (Signed)
Spoke with Caryl Pina, aware of recs.  Nothing further needed at this time- will close encounter.

## 2020-02-09 NOTE — Telephone Encounter (Signed)
02/09/2020  I have reviewed patient's chart.  The last appointment was in April/2021 with EW NP.  At that point time patient was felt to be stable.  Based on this assessment I will document the information listed below:  Peri-operative Assessment of Pulmonary Risk for Non-Thoracic Surgery:  ForMr. Defer, high risk of perioperative pulmonary complications is increased by:  Age greater than 36 years  ILD - NSIP   S/P Covid 19 - Sept/2020  February/2021 echocardiogram shows estimated right  ventricle systolic pressure of 36  Respiratory complications generally occur in 1% of ASA Class I patients, 5% of ASA Class II and 10% of ASA Class III-IV patients These complications rarely result in mortality and iclude postoperative pneumonia, atelectasis, pulmonary embolism, ARDS and increased time requiring postoperative mechanical ventilation.  Overall, I recommend proceeding with the surgery if the risk for respiratory complications are outweighed by the potential benefits. This will need to be discussed between the patient and surgeon.  To reduce risks of respiratory complications, I recommend: --Pre- and post-operative incentive spirometry performed frequently while awake --Avoiding use of pancuronium during anesthesia.    If patient symptoms worsen or his breathing worsens he needs to schedule follow-up with our office.  Wyn Quaker, FNP

## 2020-02-09 NOTE — Telephone Encounter (Signed)
Spoke with Caryl Pina at [facility], states that pt is scheduled for rotator cuff repair under general anesthesia with inner scalene block on 01/13/20.  Pt is having sx at [facility] instead of at the surgical center d/t his NSIP, status of post covid to make sure supplemental O2 can be delivered post-op if necessary.   Caryl Pina wants to make sure that we (pulmonary) are ok with this, and we are ok with him proceeding with this sx since he has not had a CT chest since 09/2019.  Pt is scheduled to have a follow up chest CT next month.    Routing to APP d/t the time sensitivity of this information.  Aaron Edelman please advise if ok for pt to proceed with sx at [facility] as planned.

## 2020-02-09 NOTE — Telephone Encounter (Signed)
Pt calling back about this-- pt would like a call back as soon as possible. Pt wants to know if there are risks to doing surgery in hospital. Pt reports "not having any problems"  Please call pt back at [phone] anytime today.

## 2020-02-10 ENCOUNTER — Other Ambulatory Visit (HOSPITAL_COMMUNITY)
Admission: RE | Admit: 2020-02-10 | Discharge: 2020-02-10 | Disposition: A | Discharge status: Home / Self Care | Payer: Medicare Other | Source: Ambulatory Visit | Attending: Specialist | Admitting: Specialist

## 2020-02-10 ENCOUNTER — Telehealth: Payer: Self-pay | Admitting: Pulmonary Disease

## 2020-02-10 DIAGNOSIS — Z20822 Contact with and (suspected) exposure to covid-19: Secondary | ICD-10-CM | POA: Diagnosis not present

## 2020-02-10 DIAGNOSIS — Z01812 Encounter for preprocedural laboratory examination: Secondary | ICD-10-CM | POA: Diagnosis not present

## 2020-02-10 LAB — SARS CORONAVIRUS 2 (TAT 6-24 HRS): SARS Coronavirus 2: NEGATIVE

## 2020-02-10 MED ORDER — ROSUVASTATIN CALCIUM 20 MG PO TABS: 20.0000 mg | ORAL_TABLET | Freq: Every day | ORAL | 1 refills | Status: AC

## 2020-02-10 NOTE — Progress Notes (Signed)
PCP - Antony Contras, MD  Cardiologist -   Pulmonology Clearance from Wyn Quaker, NP dated 02-09-20  Chest x-ray - 09-02-19 EKG -  Stress Test - 10-30-19  ECHO - 10-30-19 Cardiac Cath -   Sleep Study -  CPAP -   Fasting Blood Sugar -  Checks Blood Sugar _____ times a day  Blood Thinner Instructions: Aspirin Instructions: 81 mg ASA Last Dose:  Anesthesia review:   Patient denies shortness of breath, fever, cough and chest pain at PAT appointment   Patient verbalized understanding of instructions that were given to them at the PAT appointment. Patient was also instructed that they will need to review over the PAT instructions again at home before surgery.

## 2020-02-10 NOTE — Telephone Encounter (Signed)
Spoke with patient. He wanted to know if he had been cleared for surgery. I advised him that we spoke with [facility] based on the note from 4/15 that he had been cleared. He verbalized understanding.   He also wanted to know if he could go ahead and get his CT scan scheduled for next month. Advised him I would let the PCCs know he was ready for scheduling. He verbalized understanding.   PCCs, can you all help with the CT scan? Thanks!

## 2020-02-10 NOTE — Patient Instructions (Addendum)
DUE TO COVID-19 ONLY ONE VISITOR IS ALLOWED TO COME WITH YOU AND STAY IN THE WAITING ROOM ONLY DURING PRE OP AND PROCEDURE DAY OF SURGERY. THE 1 VISITOR MAY VISIT WITH YOU AFTER SURGERY IN YOUR PRIVATE ROOM DURING VISITING HOURS ONLY!  YOU NEED TO HAVE A COVID 19 TEST O 02-11-20. PLEASE CONTINUE THE QUARANTINE INSTRUCTIONS AS OUTLINED IN YOUR HANDOUT.                Colin [name]  02/10/2020   Your procedure is scheduled on: 02-12-20   Report to [facility] Main  Entrance    Report to Admitting at 9:45 AM    Call this number if you have problems the morning of surgery [phone]    Remember:  AFTER MIDNIGHT THE NIGHT PRIOR TO SURGERY. NOTHING BY MOUTH EXCEPT CLEAR LIQUIDS UNTIL 9:15 AM . PLEASE FINISH ENSURE DRINK PER SURGEON ORDER  WHICH NEEDS TO BE COMPLETED AT 9:15 AM .   CLEAR LIQUID DIET   Foods Allowed                                                                     Foods Excluded  Coffee and tea, regular and decaf                             liquids that you cannot  Plain Jell-O any favor except red or purple                                           see through such as: Fruit ices (not with fruit pulp)                                     milk, soups, orange juice  Iced Popsicles                                    All solid food Carbonated beverages, regular and diet                                    Cranberry, grape and apple juices Sports drinks like Gatorade Lightly seasoned clear broth or consume(fat free) Sugar, honey syrup  _____________________________________________________________________       Take these medicines the morning of surgery with A SIP OF WATER: Levothyroxine (Synthroid), Omeprazole (Prilosec), and Rosuvastatin (Crestor)   BRUSH YOUR TEETH MORNING OF SURGERY AND RINSE YOUR MOUTH OUT, NO CHEWING GUM CANDY OR MINTS.                                 You may not have any metal on your body including hair pins and               piercings     Do not wear jewelry, cologne,  lotions, powders or deodorant                          Men may shave face and neck.   Do not bring valuables to the hospital. Colin [name].  Contacts, dentures or bridgework may not be worn into surgery.  Leave suitcase in the car. After surgery it may be brought to your room.     Name and phone number of your driver:  Special Instructions: N/A              Please read over the following fact sheets you were given: _____________________________________________________________________             [facility] - Preparing for Surgery Before surgery, you can play an important role.  Because skin is not sterile, your skin needs to be as free of germs as possible.  You can reduce the number of germs on your skin by washing with CHG (chlorahexidine gluconate) soap before surgery.  CHG is an antiseptic cleaner which kills germs and bonds with the skin to continue killing germs even after washing. Please DO NOT use if you have an allergy to CHG or antibacterial soaps.  If your skin becomes reddened/irritated stop using the CHG and inform your nurse when you arrive at Short Stay. Do not shave (including legs and underarms) for at least 48 hours prior to the first CHG shower.  You may shave your face/neck. Please follow these instructions carefully:  1.  Shower with CHG Soap the night before surgery and the  morning of Surgery.  2.  If you choose to wash your hair, wash your hair first as usual with your  normal  shampoo.  3.  After you shampoo, rinse your hair and body thoroughly to remove the  shampoo.                           4.  Use CHG as you would any other liquid soap.  You can apply chg directly  to the skin and wash                       Gently with a scrungie or clean washcloth.  5.  Apply the CHG Soap to your body ONLY FROM THE NECK DOWN.   Do not use on face/ open                           Wound  or open sores. Avoid contact with eyes, ears mouth and genitals (private parts).                       Wash face,  Genitals (private parts) with your normal soap.             6.  Wash thoroughly, paying special attention to the area where your surgery  will be performed.  7.  Thoroughly rinse your body with warm water from the neck down.  8.  DO NOT shower/wash with your normal soap after using and rinsing off  the CHG Soap.                9.  Pat yourself dry with a clean towel.  10.  Wear clean pajamas.            11.  Place clean sheets on your bed the night of your first shower and do not  sleep with pets. Day of Surgery : Do not apply any lotions/deodorants the morning of surgery.  Please wear clean clothes to the hospital/surgery center.  FAILURE TO FOLLOW THESE INSTRUCTIONS MAY RESULT IN THE CANCELLATION OF YOUR SURGERY PATIENT SIGNATURE_________________________________  NURSE SIGNATURE__________________________________  ________________________________________________________________________   Colin [name]  An incentive spirometer is a tool that can help keep your lungs clear and active. This tool measures how well you are filling your lungs with each breath. Taking long deep breaths may help reverse or decrease the chance of developing breathing (pulmonary) problems (especially infection) following:  A long period of time when you are unable to move or be active. BEFORE THE PROCEDURE   If the spirometer includes an indicator to show your best effort, your nurse or respiratory therapist will set it to a desired goal.  If possible, sit up straight or lean slightly forward. Try not to slouch.  Hold the incentive spirometer in an upright position. INSTRUCTIONS FOR USE  1. Sit on the edge of your bed if possible, or sit up as far as you can in bed or on a chair. 2. Hold the incentive spirometer in an upright position. 3. Breathe out normally. 4. Place the  mouthpiece in your mouth and seal your lips tightly around it. 5. Breathe in slowly and as deeply as possible, raising the piston or the ball toward the top of the column. 6. Hold your breath for 3-5 seconds or for as long as possible. Allow the piston or ball to fall to the bottom of the column. 7. Remove the mouthpiece from your mouth and breathe out normally. 8. Rest for a few seconds and repeat Steps 1 through 7 at least 10 times every 1-2 hours when you are awake. Take your time and take a few normal breaths between deep breaths. 9. The spirometer may include an indicator to show your best effort. Use the indicator as a goal to work toward during each repetition. 10. After each set of 10 deep breaths, practice coughing to be sure your lungs are clear. If you have an incision (the cut made at the time of surgery), support your incision when coughing by placing a pillow or rolled up towels firmly against it. Once you are able to get out of bed, walk around indoors and cough well. You may stop using the incentive spirometer when instructed by your caregiver.  RISKS AND COMPLICATIONS  Take your time so you do not get dizzy or light-headed.  If you are in pain, you may need to take or ask for pain medication before doing incentive spirometry. It is harder to take a deep breath if you are having pain. AFTER USE  Rest and breathe slowly and easily.  It can be helpful to keep track of a log of your progress. Your caregiver can provide you with a simple table to help with this. If you are using the spirometer at home, follow these instructions: [place] IF:   You are having difficultly using the spirometer.  You have trouble using the spirometer as often as instructed.  Your pain medication is not giving enough relief while using the spirometer.  You develop fever of 100.5 F (38.1 C) or higher. SEEK IMMEDIATE MEDICAL CARE IF:   You cough up bloody sputum  that had not been present  before.  You develop fever of 102 F (38.9 C) or greater.  You develop worsening pain at or near the incision site. MAKE SURE YOU:   Understand these instructions.  Will watch your condition.  Will get help right away if you are not doing well or get worse. Document Released: 01/15/2007 Document Revised: 11/27/2011 Document Reviewed: 03/18/2007 ExitCare Patient Information 2014 [place].   ________________________________________________________________________   Incentive Spirometer  An incentive spirometer is a tool that can help keep your lungs clear and active. This tool measures how well you are filling your lungs with each breath. Taking long deep breaths may help reverse or decrease the chance of developing breathing (pulmonary) problems (especially infection) following:  A long period of time when you are unable to move or be active. BEFORE THE PROCEDURE   If the spirometer includes an indicator to show your best effort, your nurse or respiratory therapist will set it to a desired goal.  If possible, sit up straight or lean slightly forward. Try not to slouch.  Hold the incentive spirometer in an upright position. INSTRUCTIONS FOR USE  11. Sit on the edge of your bed if possible, or sit up as far as you can in bed or on a chair. 12. Hold the incentive spirometer in an upright position. 13. Breathe out normally. 14. Place the mouthpiece in your mouth and seal your lips tightly around it. 15. Breathe in slowly and as deeply as possible, raising the piston or the ball toward the top of the column. 16. Hold your breath for 3-5 seconds or for as long as possible. Allow the piston or ball to fall to the bottom of the column. 17. Remove the mouthpiece from your mouth and breathe out normally. 18. Rest for a few seconds and repeat Steps 1 through 7 at least 10 times every 1-2 hours when you are awake. Take your time and take a few normal breaths between deep  breaths. 19. The spirometer may include an indicator to show your best effort. Use the indicator as a goal to work toward during each repetition. 20. After each set of 10 deep breaths, practice coughing to be sure your lungs are clear. If you have an incision (the cut made at the time of surgery), support your incision when coughing by placing a pillow or rolled up towels firmly against it. Once you are able to get out of bed, walk around indoors and cough well. You may stop using the incentive spirometer when instructed by your caregiver.  RISKS AND COMPLICATIONS  Take your time so you do not get dizzy or light-headed.  If you are in pain, you may need to take or ask for pain medication before doing incentive spirometry. It is harder to take a deep breath if you are having pain. AFTER USE  Rest and breathe slowly and easily.  It can be helpful to keep track of a log of your progress. Your caregiver can provide you with a simple table to help with this. If you are using the spirometer at home, follow these instructions: [place] IF:   You are having difficultly using the spirometer.  You have trouble using the spirometer as often as instructed.  Your pain medication is not giving enough relief while using the spirometer.  You develop fever of 100.5 F (38.1 C) or higher. SEEK IMMEDIATE MEDICAL CARE IF:   You cough up bloody sputum that had not been present  before.  You develop fever of 102 F (38.9 C) or greater.  You develop worsening pain at or near the incision site. MAKE SURE YOU:   Understand these instructions.  Will watch your condition.  Will get help right away if you are not doing well or get worse. Document Released: 01/15/2007 Document Revised: 11/27/2011 Document Reviewed: 03/18/2007 ExitCare Patient Information 2014 ExitCare, [place].   ________________________________________________________________________  WHAT IS A BLOOD TRANSFUSION? Blood  Transfusion Information  A transfusion is the replacement of blood or some of its parts. Blood is made up of multiple cells which provide different functions.  Red blood cells carry oxygen and are used for blood loss replacement.  White blood cells fight against infection.  Platelets control bleeding.  Plasma helps clot blood.  Other blood products are available for specialized needs, such as hemophilia or other clotting disorders. BEFORE THE TRANSFUSION  Who gives blood for transfusions?   Healthy volunteers who are fully evaluated to make sure their blood is safe. This is blood bank blood. Transfusion therapy is the safest it has ever been in the practice of medicine. Before blood is taken from a donor, a complete history is taken to make sure that person has no history of diseases nor engages in risky social behavior (examples are intravenous drug use or sexual activity with multiple partners). The donor's travel history is screened to minimize risk of transmitting infections, such as malaria. The donated blood is tested for signs of infectious diseases, such as HIV and hepatitis. The blood is then tested to be sure it is compatible with you in order to minimize the chance of a transfusion reaction. If you or a relative donates blood, this is often done in anticipation of surgery and is not appropriate for emergency situations. It takes many days to process the donated blood. RISKS AND COMPLICATIONS Although transfusion therapy is very safe and saves many lives, the main dangers of transfusion include:   Getting an infectious disease.  Developing a transfusion reaction. This is an allergic reaction to something in the blood you were given. Every precaution is taken to prevent this. The decision to have a blood transfusion has been considered carefully by your caregiver before blood is given. Blood is not given unless the benefits outweigh the risks. AFTER THE TRANSFUSION  Right after  receiving a blood transfusion, you will usually feel much better and more energetic. This is especially true if your red blood cells have gotten low (anemic). The transfusion raises the level of the red blood cells which carry oxygen, and this usually causes an energy increase.  The nurse administering the transfusion will monitor you carefully for complications. HOME CARE INSTRUCTIONS  No special instructions are needed after a transfusion. You may find your energy is better. Speak with your caregiver about any limitations on activity for underlying diseases you may have. SEEK MEDICAL CARE IF:   Your condition is not improving after your transfusion.  You develop redness or irritation at the intravenous (IV) site. SEEK IMMEDIATE MEDICAL CARE IF:  Any of the following symptoms occur over the next 12 hours:  Shaking chills.  You have a temperature by mouth above 102 F (38.9 C), not controlled by medicine.  Chest, back, or muscle pain.  People around you feel you are not acting correctly or are confused.  Shortness of breath or difficulty breathing.  Dizziness and fainting.  You get a rash or develop hives.  You have a decrease in urine output.  Your  urine turns a dark color or changes to pink, red, or brown. Any of the following symptoms occur over the next 10 days:  You have a temperature by mouth above 102 F (38.9 C), not controlled by medicine.  Shortness of breath.  Weakness after normal activity.  The white part of the eye turns yellow (jaundice).  You have a decrease in the amount of urine or are urinating less often.  Your urine turns a dark color or changes to pink, red, or brown. Document Released: 09/01/2000 Document Revised: 11/27/2011 Document Reviewed: 04/20/2008 [facility] Patient Information 2014 [place], [place].  _______________________________________________________________________

## 2020-02-10 NOTE — Telephone Encounter (Signed)
Working on this one.  

## 2020-02-10 NOTE — Progress Notes (Signed)
Please place surgery orders. Pt scheduled for PAT appt tomorrow.

## 2020-02-11 ENCOUNTER — Other Ambulatory Visit: Payer: Self-pay

## 2020-02-11 ENCOUNTER — Encounter (HOSPITAL_COMMUNITY)
Admission: RE | Admit: 2020-02-11 | Discharge: 2020-02-11 | Disposition: A | Discharge status: Home / Self Care | Payer: Medicare Other | Source: Ambulatory Visit | Attending: Specialist | Admitting: Specialist

## 2020-02-11 ENCOUNTER — Encounter (HOSPITAL_COMMUNITY): Payer: Self-pay

## 2020-02-11 DIAGNOSIS — R262 Difficulty in walking, not elsewhere classified: Secondary | ICD-10-CM | POA: Diagnosis not present

## 2020-02-11 DIAGNOSIS — M25559 Pain in unspecified hip: Secondary | ICD-10-CM | POA: Diagnosis not present

## 2020-02-11 DIAGNOSIS — M545 Low back pain: Secondary | ICD-10-CM | POA: Diagnosis not present

## 2020-02-11 DIAGNOSIS — Z01812 Encounter for preprocedural laboratory examination: Secondary | ICD-10-CM | POA: Diagnosis not present

## 2020-02-11 DIAGNOSIS — Z96643 Presence of artificial hip joint, bilateral: Secondary | ICD-10-CM | POA: Diagnosis not present

## 2020-02-11 LAB — BASIC METABOLIC PANEL
Anion gap: 11 (ref 5–15)
BUN: 21 mg/dL (ref 8–23)
CO2: 24 mmol/L (ref 22–32)
Calcium: 9.5 mg/dL (ref 8.9–10.3)
Chloride: 105 mmol/L (ref 98–111)
Creatinine, Ser: 1.01 mg/dL (ref 0.61–1.24)
GFR calc Af Amer: >60 mL/min (ref >60)
GFR calc non Af Amer: >60 mL/min (ref >60)
Glucose, Bld: 114 mg/dL — ABNORMAL HIGH (ref 70–99)
Potassium: 4.4 mmol/L (ref 3.5–5.1)
Sodium: 140 mmol/L (ref 135–145)

## 2020-02-11 LAB — CBC
HCT: 41.9 % (ref 39.0–52.0)
Hemoglobin: 13.4 g/dL (ref 13.0–17.0)
MCH: 30.7 pg (ref 26.0–34.0)
MCHC: 32 g/dL (ref 30.0–36.0)
MCV: 95.9 fL (ref 80.0–100.0)
Platelets: 201 10*3/uL (ref 150–400)
RBC: 4.37 MIL/uL (ref 4.22–5.81)
RDW: 13.1 % (ref 11.5–15.5)
WBC: 8.2 10*3/uL (ref 4.0–10.5)
nRBC: 0 % (ref 0.0–0.2)

## 2020-02-11 NOTE — Telephone Encounter (Signed)
Sched for 6/14 @ 11, check in by 10:45, no prep @ [facility].  Gave appt info to pt who verbalized understanding.  Nothing further needed at this time.

## 2020-02-11 NOTE — H&P (Signed)
Colin [name] is an 69 y.o. male.   Chief Complaint: right shoulder pain HPI:  This is a very pleasant right-hand dominant 69 year old male who unfortunately suffered a fall landing on his right shoulder causing a rotator cuff tear. He was walking out of the client's house when he tripped d fell at the end of April.  He has since then got an MRI that showed a rotator cuff tear in the right shoulder. He has 0 out of 10 pain with rest, 10 out of 10 pain with any movement. He has been in his sling since the injury. He takes ibuprofen on a regular basis for other issues.  Past Medical History:  Diagnosis Date  . Arthritis   . Bowel trouble    Stool leakage since colonscopy 02/2016  . Cancer ([place])    skin cancer  . Carpal tunnel syndrome, bilateral   . Depression    in teenage years   . GERD (gastroesophageal reflux disease)   . Hyperlipemia   . Hypertension   . Hypothyroidism   . Interstitial lung disease ([place])   . Pneumonia   . Shortness of breath dyspnea    with bending over   . Wears glasses     Past Surgical History:  Procedure Laterality Date  . APPENDECTOMY    . CARPAL TUNNEL RELEASE  04/23/2012   Procedure: CARPAL TUNNEL RELEASE;  Surgeon: Cammie Sickle., MD;  Location: [facility];  Service: Orthopedics;  Laterality: Left;  . CARPAL TUNNEL RELEASE  06/27/2012   Procedure: CARPAL TUNNEL RELEASE;  Surgeon: Cammie Sickle., MD;  Location: [facility];  Service: Orthopedics;  Laterality: Right;  . CERVICAL DISCECTOMY  2007  . colonscopy     polyps removed / benign  . HERNIA REPAIR    . INCISIONAL HERNIA REPAIR  2011   subcostal from append  . JOINT REPLACEMENT  81,87   lt total hip  . LAPAROSCOPIC APPENDECTOMY  2010   required open repair  . TONSILLECTOMY    . TOTAL HIP ARTHROPLASTY     x3 left hip [phone]  . TOTAL HIP ARTHROPLASTY Right 04/05/2016   Procedure: RIGHT TOTAL HIP ARTHROPLASTY ANTERIOR APPROACH;  Surgeon: Gaynelle Arabian, MD;  Location: [facility];  Service: Orthopedics;  Laterality: Right;    Family History  Problem Relation Age of Onset  . Breast cancer Mother   . Hypertension Mother   . Heart disease Father   . Lung cancer Father   . Hypertension Father   . Breast cancer Sister    Social History:  reports that he has never smoked. He has never used smokeless tobacco. He reports current alcohol use. He reports that he does not use drugs.  Allergies: No Known Allergies  No medications prior to admission.    Results for orders placed or performed during the hospital encounter of 02/11/20 (from the past 48 hour(s))  Basic metabolic panel     Status: Abnormal   Collection Time: 02/11/20 11:35 AM  Result Value Ref Range   Sodium 140 135 - 145 mmol/L   Potassium 4.4 3.5 - 5.1 mmol/L   Chloride 105 98 - 111 mmol/L   CO2 24 22 - 32 mmol/L   Glucose, Bld 114 (H) 70 - 99 mg/dL    Comment: Glucose reference range applies only to samples taken after fasting for at least 8 hours.   BUN 21 8 - 23 mg/dL   Creatinine, Ser 1.01 0.61 -  1.24 mg/dL   Calcium 9.5 8.9 - 10.3 mg/dL   GFR calc non Af Amer >60 >60 mL/min   GFR calc Af Amer >60 >60 mL/min   Anion gap 11 5 - 15    Comment: Performed at [facility], [place] [address], [place], [place] [address]  CBC     Status: None   Collection Time: 02/11/20 11:35 AM  Result Value Ref Range   WBC 8.2 4.0 - 10.5 K/uL   RBC 4.37 4.22 - 5.81 MIL/uL   Hemoglobin 13.4 13.0 - 17.0 g/dL   HCT 41.9 39.0 - 52.0 %   MCV 95.9 80.0 - 100.0 fL   MCH 30.7 26.0 - 34.0 pg   MCHC 32.0 30.0 - 36.0 g/dL   RDW 13.1 11.5 - 15.5 %   Platelets 201 150 - 400 K/uL   nRBC 0.0 0.0 - 0.2 %    Comment: Performed at [facility], [place] [address] [place], [place] [address]   No results found.  Review of Systems  All other systems reviewed and are negative.   There were no vitals taken for this visit. Physical Exam  Constitutional: He  appears well-developed and well-nourished. No distress.  Musculoskeletal:     Comments: On exam of his right shoulder no skin changes or effusions noted. He has no tenderness with palpation throughout the entirety of the right shoulder. He has maybe 5-10 of active forward flexion and/or abduction with the shoulder. I can passively forward flex and passively abduct him through full range of motion with no pain. Full range of motion of his right elbow. He is neurovascularly intact in right upper extremity.  Skin: He is not diaphoretic.     Assessment/Plan Right shoulder rotator cuff tear: Patient has a massive  Rotator cuff tear of the right shoulder.  We talked to him about surgical versus nonsurgical management in the office.  He wants to proceed with surgical management.  Risks and benefits of surgery were discussed with patient in the office.  Patient is set up for a right shoulder arthroscopy with a subacromial decompression, distal clavicle resection, biceps debridement and a rotator cuff repair.  Drue Novel, PA 02/11/2020, 12:40 PM

## 2020-02-12 ENCOUNTER — Encounter (HOSPITAL_COMMUNITY)
Admission: RE | Disposition: A | Discharge status: Home / Self Care | Payer: Self-pay | Source: Home / Self Care | Attending: Specialist

## 2020-02-12 ENCOUNTER — Ambulatory Visit (HOSPITAL_COMMUNITY): Payer: Medicare Other | Admitting: Certified Registered Nurse Anesthetist

## 2020-02-12 ENCOUNTER — Other Ambulatory Visit: Payer: Self-pay

## 2020-02-12 ENCOUNTER — Observation Stay (HOSPITAL_COMMUNITY)
Admission: RE | Admit: 2020-02-12 | Discharge: 2020-02-13 | Disposition: A | Discharge status: Home / Self Care | Payer: Medicare Other | Attending: Specialist | Admitting: Specialist

## 2020-02-12 ENCOUNTER — Encounter (HOSPITAL_COMMUNITY): Payer: Self-pay | Admitting: Specialist

## 2020-02-12 DIAGNOSIS — M7551 Bursitis of right shoulder: Secondary | ICD-10-CM | POA: Diagnosis not present

## 2020-02-12 DIAGNOSIS — W010XXA Fall on same level from slipping, tripping and stumbling without subsequent striking against object, initial encounter: Secondary | ICD-10-CM | POA: Insufficient documentation

## 2020-02-12 DIAGNOSIS — X58XXXA Exposure to other specified factors, initial encounter: Secondary | ICD-10-CM | POA: Diagnosis not present

## 2020-02-12 DIAGNOSIS — E669 Obesity, unspecified: Secondary | ICD-10-CM | POA: Diagnosis not present

## 2020-02-12 DIAGNOSIS — E785 Hyperlipidemia, unspecified: Secondary | ICD-10-CM | POA: Insufficient documentation

## 2020-02-12 DIAGNOSIS — Z6835 Body mass index (BMI) 35.0-35.9, adult: Secondary | ICD-10-CM | POA: Diagnosis not present

## 2020-02-12 DIAGNOSIS — S46011A Strain of muscle(s) and tendon(s) of the rotator cuff of right shoulder, initial encounter: Secondary | ICD-10-CM | POA: Diagnosis not present

## 2020-02-12 DIAGNOSIS — K219 Gastro-esophageal reflux disease without esophagitis: Secondary | ICD-10-CM | POA: Diagnosis not present

## 2020-02-12 DIAGNOSIS — S46111A Strain of muscle, fascia and tendon of long head of biceps, right arm, initial encounter: Secondary | ICD-10-CM | POA: Diagnosis not present

## 2020-02-12 DIAGNOSIS — E039 Hypothyroidism, unspecified: Secondary | ICD-10-CM | POA: Diagnosis not present

## 2020-02-12 DIAGNOSIS — S43431A Superior glenoid labrum lesion of right shoulder, initial encounter: Secondary | ICD-10-CM | POA: Diagnosis not present

## 2020-02-12 DIAGNOSIS — J849 Interstitial pulmonary disease, unspecified: Secondary | ICD-10-CM | POA: Insufficient documentation

## 2020-02-12 DIAGNOSIS — M75101 Unspecified rotator cuff tear or rupture of right shoulder, not specified as traumatic: Secondary | ICD-10-CM | POA: Diagnosis present

## 2020-02-12 DIAGNOSIS — G8918 Other acute postprocedural pain: Secondary | ICD-10-CM | POA: Diagnosis not present

## 2020-02-12 DIAGNOSIS — M19011 Primary osteoarthritis, right shoulder: Secondary | ICD-10-CM | POA: Insufficient documentation

## 2020-02-12 DIAGNOSIS — M75121 Complete rotator cuff tear or rupture of right shoulder, not specified as traumatic: Secondary | ICD-10-CM | POA: Diagnosis not present

## 2020-02-12 DIAGNOSIS — I10 Essential (primary) hypertension: Secondary | ICD-10-CM | POA: Diagnosis not present

## 2020-02-12 DIAGNOSIS — Z96643 Presence of artificial hip joint, bilateral: Secondary | ICD-10-CM | POA: Diagnosis not present

## 2020-02-12 HISTORY — PX: SHOULDER ARTHROSCOPY WITH ROTATOR CUFF REPAIR: SHX5685

## 2020-02-12 SURGERY — ARTHROSCOPY, SHOULDER, WITH ROTATOR CUFF REPAIR
Anesthesia: General | Laterality: Right

## 2020-02-12 MED ORDER — ACETAMINOPHEN 500 MG PO TABS
500.0000 mg | ORAL_TABLET | Freq: Four times a day (QID) | ORAL | Status: DC
  Administered 2020-02-12 – 2020-02-13 (×2): 500 mg via ORAL
  Filled 2020-02-12 (×2): qty 1

## 2020-02-12 MED ORDER — ONDANSETRON HCL 4 MG/2ML IJ SOLN
INTRAMUSCULAR | Status: AC
  Filled 2020-02-12: qty 2

## 2020-02-12 MED ORDER — PHENYLEPHRINE HCL-NACL 10-0.9 MG/250ML-% IV SOLN
INTRAVENOUS | Status: DC | PRN
  Administered 2020-02-12: 15 ug/min via INTRAVENOUS

## 2020-02-12 MED ORDER — METHOCARBAMOL 500 MG IVPB - SIMPLE MED
500.0000 mg | Freq: Four times a day (QID) | INTRAVENOUS | Status: DC | PRN
  Filled 2020-02-12: qty 50

## 2020-02-12 MED ORDER — ACETAMINOPHEN 325 MG PO TABS: 325.0000 mg | ORAL_TABLET | Freq: Four times a day (QID) | ORAL | Status: DC | PRN

## 2020-02-12 MED ORDER — MORPHINE SULFATE (PF) 4 MG/ML IV SOLN: 0.5000 mg | INTRAVENOUS | Status: DC | PRN

## 2020-02-12 MED ORDER — SODIUM CHLORIDE 0.9% FLUSH
INTRAVENOUS | Status: DC | PRN
  Administered 2020-02-12: 20 mL via INTRAVENOUS

## 2020-02-12 MED ORDER — SENNOSIDES-DOCUSATE SODIUM 8.6-50 MG PO TABS: 1.0000 | ORAL_TABLET | Freq: Every evening | ORAL | Status: DC | PRN

## 2020-02-12 MED ORDER — SUGAMMADEX SODIUM 200 MG/2ML IV SOLN
INTRAVENOUS | Status: DC | PRN
  Administered 2020-02-12: 250 mg via INTRAVENOUS

## 2020-02-12 MED ORDER — ROCURONIUM BROMIDE 10 MG/ML (PF) SYRINGE
PREFILLED_SYRINGE | INTRAVENOUS | Status: DC | PRN
  Administered 2020-02-12: 60 mg via INTRAVENOUS

## 2020-02-12 MED ORDER — OXYCODONE HCL 5 MG PO TABS: 5.0000 mg | ORAL_TABLET | ORAL | 0 refills | Status: AC | PRN

## 2020-02-12 MED ORDER — LIDOCAINE 2% (20 MG/ML) 5 ML SYRINGE
INTRAMUSCULAR | Status: DC | PRN
  Administered 2020-02-12: 40 mg via INTRAVENOUS

## 2020-02-12 MED ORDER — ONDANSETRON HCL 4 MG/2ML IJ SOLN
INTRAMUSCULAR | Status: DC | PRN
  Administered 2020-02-12: 4 mg via INTRAVENOUS

## 2020-02-12 MED ORDER — CHLORHEXIDINE GLUCONATE 0.12 % MT SOLN
15.0000 mL | Freq: Once | OROMUCOSAL | Status: AC
  Administered 2020-02-12: 15 mL via OROMUCOSAL
  Filled 2020-02-12: qty 15

## 2020-02-12 MED ORDER — LOSARTAN POTASSIUM-HCTZ 50-12.5 MG PO TABS: 1.0000 | ORAL_TABLET | Freq: Every day | ORAL | Status: DC

## 2020-02-12 MED ORDER — TRAMADOL HCL 50 MG PO TABS
50.0000 mg | ORAL_TABLET | Freq: Four times a day (QID) | ORAL | Status: DC
  Administered 2020-02-12 – 2020-02-13 (×2): 50 mg via ORAL
  Filled 2020-02-12 (×2): qty 1

## 2020-02-12 MED ORDER — PRAVASTATIN SODIUM 10 MG PO TABS: 10.0000 mg | ORAL_TABLET | Freq: Every day | ORAL | Status: DC

## 2020-02-12 MED ORDER — ONDANSETRON HCL 4 MG/2ML IJ SOLN: 4.0000 mg | Freq: Once | INTRAMUSCULAR | Status: DC | PRN

## 2020-02-12 MED ORDER — ONDANSETRON HCL 4 MG/2ML IJ SOLN: 4.0000 mg | Freq: Four times a day (QID) | INTRAMUSCULAR | Status: DC | PRN

## 2020-02-12 MED ORDER — FENTANYL CITRATE (PF) 100 MCG/2ML IJ SOLN
25.0000 ug | Freq: Once | INTRAMUSCULAR | Status: AC
  Administered 2020-02-12: 100 ug via INTRAVENOUS
  Filled 2020-02-12: qty 2

## 2020-02-12 MED ORDER — LEVOTHYROXINE SODIUM 75 MCG PO TABS
75.0000 ug | ORAL_TABLET | Freq: Every day | ORAL | Status: DC
  Administered 2020-02-12: 75 ug via ORAL
  Filled 2020-02-12: qty 1

## 2020-02-12 MED ORDER — METHOCARBAMOL 500 MG PO TABS: 500.0000 mg | ORAL_TABLET | Freq: Four times a day (QID) | ORAL | Status: DC | PRN

## 2020-02-12 MED ORDER — ROSUVASTATIN CALCIUM 20 MG PO TABS: 20.0000 mg | ORAL_TABLET | Freq: Every day | ORAL | Status: DC

## 2020-02-12 MED ORDER — ORAL CARE MOUTH RINSE: 15.0000 mL | Freq: Once | OROMUCOSAL | Status: AC

## 2020-02-12 MED ORDER — ACETAMINOPHEN 500 MG PO TABS
1000.0000 mg | ORAL_TABLET | Freq: Once | ORAL | Status: AC
  Administered 2020-02-12: 1000 mg via ORAL
  Filled 2020-02-12: qty 2

## 2020-02-12 MED ORDER — PHENYLEPHRINE HCL (PRESSORS) 10 MG/ML IV SOLN
INTRAVENOUS | Status: AC
  Filled 2020-02-12: qty 1

## 2020-02-12 MED ORDER — METHOCARBAMOL 500 MG PO TABS: 500.0000 mg | ORAL_TABLET | Freq: Four times a day (QID) | ORAL | 0 refills | Status: DC

## 2020-02-12 MED ORDER — HYDROCHLOROTHIAZIDE 12.5 MG PO CAPS
12.5000 mg | ORAL_CAPSULE | Freq: Every day | ORAL | Status: DC
  Administered 2020-02-13: 12.5 mg via ORAL
  Filled 2020-02-12: qty 1

## 2020-02-12 MED ORDER — EPINEPHRINE PF 1 MG/ML IJ SOLN
INTRAMUSCULAR | Status: AC
  Filled 2020-02-12: qty 1

## 2020-02-12 MED ORDER — MIDAZOLAM HCL 2 MG/2ML IJ SOLN
1.0000 mg | Freq: Once | INTRAMUSCULAR | Status: AC
  Administered 2020-02-12: 1 mg via INTRAVENOUS
  Filled 2020-02-12: qty 2

## 2020-02-12 MED ORDER — LACTATED RINGERS IV SOLN: INTRAVENOUS | Status: DC

## 2020-02-12 MED ORDER — DEXTROSE 5 % IV SOLN
3.0000 g | INTRAVENOUS | Status: AC
  Administered 2020-02-12: 3 g via INTRAVENOUS
  Filled 2020-02-12: qty 3

## 2020-02-12 MED ORDER — PROPOFOL 10 MG/ML IV BOLUS
INTRAVENOUS | Status: AC
  Filled 2020-02-12: qty 20

## 2020-02-12 MED ORDER — PANTOPRAZOLE SODIUM 40 MG PO TBEC
40.0000 mg | DELAYED_RELEASE_TABLET | Freq: Every day | ORAL | Status: DC
  Administered 2020-02-13: 40 mg via ORAL
  Filled 2020-02-12 (×2): qty 1

## 2020-02-12 MED ORDER — LOSARTAN POTASSIUM 50 MG PO TABS
50.0000 mg | ORAL_TABLET | Freq: Every day | ORAL | Status: DC
  Administered 2020-02-13: 50 mg via ORAL
  Filled 2020-02-12: qty 1

## 2020-02-12 MED ORDER — FENTANYL CITRATE (PF) 100 MCG/2ML IJ SOLN
INTRAMUSCULAR | Status: DC | PRN
  Administered 2020-02-12: 100 ug via INTRAVENOUS

## 2020-02-12 MED ORDER — LIDOCAINE 2% (20 MG/ML) 5 ML SYRINGE
INTRAMUSCULAR | Status: AC
  Filled 2020-02-12: qty 5

## 2020-02-12 MED ORDER — CEFAZOLIN SODIUM-DEXTROSE 1-4 GM/50ML-% IV SOLN
1.0000 g | Freq: Three times a day (TID) | INTRAVENOUS | Status: DC
  Administered 2020-02-12 – 2020-02-13 (×2): 1 g via INTRAVENOUS
  Filled 2020-02-12 (×5): qty 50

## 2020-02-12 MED ORDER — SODIUM CHLORIDE (PF) 0.9 % IJ SOLN
INTRAMUSCULAR | Status: AC
  Filled 2020-02-12: qty 50

## 2020-02-12 MED ORDER — DEXAMETHASONE SODIUM PHOSPHATE 10 MG/ML IJ SOLN
INTRAMUSCULAR | Status: DC | PRN
  Administered 2020-02-12: 10 mg via INTRAVENOUS

## 2020-02-12 MED ORDER — OXYCODONE HCL 5 MG PO TABS: 5.0000 mg | ORAL_TABLET | Freq: Once | ORAL | Status: DC | PRN

## 2020-02-12 MED ORDER — PROPOFOL 10 MG/ML IV BOLUS
INTRAVENOUS | Status: DC | PRN
  Administered 2020-02-12: 170 mg via INTRAVENOUS

## 2020-02-12 MED ORDER — ONDANSETRON HCL 4 MG PO TABS: 4.0000 mg | ORAL_TABLET | Freq: Every day | ORAL | 1 refills | Status: DC | PRN

## 2020-02-12 MED ORDER — HYDROCODONE-ACETAMINOPHEN 7.5-325 MG PO TABS: 1.0000 | ORAL_TABLET | ORAL | Status: DC | PRN

## 2020-02-12 MED ORDER — DEXAMETHASONE SODIUM PHOSPHATE 10 MG/ML IJ SOLN
INTRAMUSCULAR | Status: AC
  Filled 2020-02-12: qty 1

## 2020-02-12 MED ORDER — BUPIVACAINE HCL (PF) 0.25 % IJ SOLN
INTRAMUSCULAR | Status: AC
  Filled 2020-02-12: qty 30

## 2020-02-12 MED ORDER — ROSUVASTATIN CALCIUM 20 MG PO TABS
20.0000 mg | ORAL_TABLET | Freq: Every day | ORAL | Status: DC
  Administered 2020-02-13: 20 mg via ORAL
  Filled 2020-02-12: qty 1

## 2020-02-12 MED ORDER — BUPIVACAINE LIPOSOME 1.3 % IJ SUSP
INTRAMUSCULAR | Status: DC | PRN
  Administered 2020-02-12: 10 mL via PERINEURAL

## 2020-02-12 MED ORDER — HYDROMORPHONE HCL 1 MG/ML IJ SOLN: 0.2500 mg | INTRAMUSCULAR | Status: DC | PRN

## 2020-02-12 MED ORDER — ALBUTEROL SULFATE (2.5 MG/3ML) 0.083% IN NEBU: 3.0000 mL | INHALATION_SOLUTION | Freq: Four times a day (QID) | RESPIRATORY_TRACT | Status: DC | PRN

## 2020-02-12 MED ORDER — HYDROCODONE-ACETAMINOPHEN 5-325 MG PO TABS: 1.0000 | ORAL_TABLET | ORAL | Status: DC | PRN

## 2020-02-12 MED ORDER — FENTANYL CITRATE (PF) 100 MCG/2ML IJ SOLN
INTRAMUSCULAR | Status: AC
  Filled 2020-02-12: qty 2

## 2020-02-12 MED ORDER — BUPIVACAINE-EPINEPHRINE (PF) 0.5% -1:200000 IJ SOLN
INTRAMUSCULAR | Status: DC | PRN
Start: 2020-02-12 — End: 2020-02-12
  Administered 2020-02-12: 15 mL via PERINEURAL

## 2020-02-12 MED ORDER — OXYCODONE HCL 5 MG/5ML PO SOLN: 5.0000 mg | Freq: Once | ORAL | Status: DC | PRN

## 2020-02-12 MED ORDER — SODIUM CHLORIDE 0.9 % IR SOLN
Status: DC | PRN
  Administered 2020-02-12: 21000 mL

## 2020-02-12 MED ORDER — EPINEPHRINE 1 MG/10ML IJ SOSY
PREFILLED_SYRINGE | INTRAMUSCULAR | Status: DC | PRN
  Administered 2020-02-12: 0.2 mg

## 2020-02-12 MED ORDER — ZOLPIDEM TARTRATE 5 MG PO TABS: 5.0000 mg | ORAL_TABLET | Freq: Every evening | ORAL | Status: DC | PRN

## 2020-02-12 MED ORDER — ONDANSETRON HCL 4 MG PO TABS: 4.0000 mg | ORAL_TABLET | Freq: Four times a day (QID) | ORAL | Status: DC | PRN

## 2020-02-12 MED ORDER — SUGAMMADEX SODIUM 500 MG/5ML IV SOLN
INTRAVENOUS | Status: AC
  Filled 2020-02-12: qty 5

## 2020-02-12 MED ORDER — ASPIRIN EC 81 MG PO TBEC
81.0000 mg | DELAYED_RELEASE_TABLET | Freq: Every day | ORAL | Status: DC
  Administered 2020-02-13: 81 mg via ORAL
  Filled 2020-02-12: qty 1

## 2020-02-12 SURGICAL SUPPLY — 61 items
ANCHOR PEEK 4.75X19.1 SWLK C (Anchor) ×10 IMPLANT
BLADE EXCALIBUR 4.0X13 (MISCELLANEOUS) ×2 IMPLANT
BLADE SURG 15 STRL LF DISP TIS (BLADE) IMPLANT
BLADE SURG 15 STRL SS (BLADE)
BLADE SURG SZ11 CARB STEEL (BLADE) ×2 IMPLANT
BURR OVAL 8 FLU 5.0X13 (MISCELLANEOUS) ×2 IMPLANT
CANISTER SUCT 3000ML PPV (MISCELLANEOUS) IMPLANT
CANNULA 5.75X7 CRYSTAL CLEAR (CANNULA) IMPLANT
CANNULA 5.75X71 LONG (CANNULA) IMPLANT
CANNULA TWIST IN 8.25X7CM (CANNULA) ×2 IMPLANT
COVER WAND RF STERILE (DRAPES) IMPLANT
DRAPE ORTHO SPLIT 77X108 STRL (DRAPES) ×4
DRAPE SHEET LG 3/4 BI-LAMINATE (DRAPES) ×2 IMPLANT
DRAPE STERI 35X30 U-POUCH (DRAPES) ×2 IMPLANT
DRAPE SURG ORHT 6 SPLT 77X108 (DRAPES) ×2 IMPLANT
DRAPE U-SHAPE 47X51 STRL (DRAPES) ×2 IMPLANT
DRSG PAD ABDOMINAL 8X10 ST (GAUZE/BANDAGES/DRESSINGS) ×4 IMPLANT
DURAPREP 26ML APPLICATOR (WOUND CARE) ×2 IMPLANT
DW OUTFLOW CASSETTE/TUBE SET (MISCELLANEOUS) ×2 IMPLANT
ELECT REM PT RETURN 15FT ADLT (MISCELLANEOUS) IMPLANT
EXCALIBUR 3.8MM X 13CM (MISCELLANEOUS) ×2 IMPLANT
FIBER TAPE 2MM (SUTURE) IMPLANT
FIBERSTICK 2 (SUTURE) IMPLANT
GAUZE SPONGE 4X4 12PLY STRL (GAUZE/BANDAGES/DRESSINGS) ×2 IMPLANT
GAUZE XEROFORM 1X8 LF (GAUZE/BANDAGES/DRESSINGS) ×2 IMPLANT
GLOVE BIO SURGEON STRL SZ8 (GLOVE) ×2 IMPLANT
GLOVE BIOGEL PI IND STRL 7.5 (GLOVE) ×1 IMPLANT
GLOVE BIOGEL PI INDICATOR 7.5 (GLOVE) ×1
GLOVE INDICATOR 8.0 STRL GRN (GLOVE) ×2 IMPLANT
GLOVE SURG SS PI 7.0 STRL IVOR (GLOVE) ×2 IMPLANT
GOWN STRL REUS W/TWL XL LVL3 (GOWN DISPOSABLE) ×4 IMPLANT
KIT BASIN (CUSTOM PROCEDURE TRAY) ×2 IMPLANT
KIT TURNOVER KIT A (KITS) IMPLANT
LASSO 90 CVE QUICKPAS (DISPOSABLE) ×2 IMPLANT
MANIFOLD NEPTUNE II (INSTRUMENTS) IMPLANT
NEEDLE 1/2 CIR CATGUT .05X1.09 (NEEDLE) IMPLANT
NEEDLE SCORPION MULTI FIRE (NEEDLE) ×2 IMPLANT
PACK ARTHROSCOPY WL (CUSTOM PROCEDURE TRAY) ×2 IMPLANT
PENCIL SMOKE EVACUATOR (MISCELLANEOUS) IMPLANT
PORT APPOLLO RF 90DEGREE MULTI (SURGICAL WAND) ×2 IMPLANT
PROTECTOR NERVE ULNAR (MISCELLANEOUS) IMPLANT
SLEEVE ARM SUSPENSION SYSTEM (MISCELLANEOUS) ×2 IMPLANT
SLING ULTRA II AB L (ORTHOPEDIC SUPPLIES) IMPLANT
SLING ULTRA II AB S (ORTHOPEDIC SUPPLIES) IMPLANT
SPONGE LAP 4X18 RFD (DISPOSABLE) IMPLANT
SUCTION FRAZIER HANDLE 10FR (MISCELLANEOUS)
SUCTION TUBE FRAZIER 10FR DISP (MISCELLANEOUS) IMPLANT
SUT ETHILON 3 0 PS 1 (SUTURE) ×2 IMPLANT
SUT PDS AB 1 CT1 27 (SUTURE) IMPLANT
SUT TIGER TAPE 7 IN WHITE (SUTURE) IMPLANT
SUT VIC AB 0 CT1 36 (SUTURE) IMPLANT
SUT VIC AB 2-0 CT1 27 (SUTURE)
SUT VIC AB 2-0 CT1 TAPERPNT 27 (SUTURE) IMPLANT
SUTURE TAPE 1.3 40 TPR END (SUTURE) IMPLANT
SUTURETAPE 1.3 40 TPR END (SUTURE)
SYR CONTROL 10ML LL (SYRINGE) ×2 IMPLANT
TOWEL OR 17X26 10 PK STRL BLUE (TOWEL DISPOSABLE) ×2 IMPLANT
TUBING ARTHROSCOPY IRRIG 16FT (MISCELLANEOUS) ×2 IMPLANT
TUBING CONNECTING 10 (TUBING) ×4 IMPLANT
WATER STERILE IRR 500ML POUR (IV SOLUTION) ×2 IMPLANT
YANKAUER SUCT BULB TIP NO VENT (SUCTIONS) IMPLANT

## 2020-02-12 NOTE — Anesthesia Postprocedure Evaluation (Signed)
Anesthesia Post Note  Patient: Colin [name]  Procedure(s) Performed: SHOULDER ARTHROSCOPY WITH ROTATOR CUFF REPAIR with subacromial decompression distal clavicle resection biceps tenotomy labral debridement (Right )     Patient location during evaluation: PACU Anesthesia Type: General Level of consciousness: awake and alert and oriented Pain management: pain level controlled Vital Signs Assessment: post-procedure vital signs reviewed and stable Respiratory status: spontaneous breathing, nonlabored ventilation and respiratory function stable Cardiovascular status: blood pressure returned to baseline Postop Assessment: no apparent nausea or vomiting Anesthetic complications: no    Last Vitals:  Vitals:   02/12/20 1615 02/12/20 1630  BP: 119/79 134/77  Pulse: (!) 55 60  Resp: 12 12  Temp:  (!) 36.3 C  SpO2: 97% 97%    Last Pain:  Vitals:   02/12/20 1615  TempSrc:   PainSc: 0-No pain                 Brennan Bailey

## 2020-02-12 NOTE — Anesthesia Preprocedure Evaluation (Addendum)
Anesthesia Evaluation  Patient identified by MRN, date of birth, ID band Patient awake    Reviewed: Allergy & Precautions, NPO status , Patient's Chart, lab work & pertinent test results  History of Anesthesia Complications Negative for: history of anesthetic complications  Airway Mallampati: II  TM Distance: >3 FB Neck ROM: Full    Dental no notable dental hx. (+) Dental Advisory Given   Pulmonary shortness of breath and with exertion,  Interstitial lung disease   breath sounds clear to auscultation       Cardiovascular hypertension, Pt. on medications (-) angina Rhythm:Regular Rate:Normal  '13 Myoview: normal with EF >55%  Last echo 10/27/19: 1. Left ventricular ejection fraction, by estimation, is 55 to 60%. The  left ventricle has normal function. The average left ventricular global  longitudinal strain is -17.8 %.  2. Right ventricular systolic function is normal. There is mildly  elevated pulmonary artery systolic pressure.  3. The aortic valve is tricuspid.    Neuro/Psych S/p C spine surgery 2007 negative psych ROS   GI/Hepatic Neg liver ROS, GERD  Medicated and Controlled,  Endo/Other  Hypothyroidism Obesity BMI 35  Renal/GU negative Renal ROS  negative genitourinary   Musculoskeletal  (+) Arthritis , Osteoarthritis,  Right shoulder rotator cuff tear    Abdominal (+) + obese,   Peds  Hematology negative hematology ROS (+)   Anesthesia Other Findings   Reproductive/Obstetrics negative OB ROS                           Anesthesia Physical Anesthesia Plan  ASA: III  Anesthesia Plan: General   Post-op Pain Management:  Regional for Post-op pain   Induction: Intravenous  PONV Risk Score and Plan: 2 and Ondansetron, Dexamethasone, Treatment may vary due to age or medical condition and Midazolam  Airway Management Planned: Oral ETT  Additional Equipment: None  Intra-op  Plan:   Post-operative Plan: Extubation in OR  Informed Consent: I have reviewed the patients History and Physical, chart, labs and discussed the procedure including the risks, benefits and alternatives for the proposed anesthesia with the patient or authorized representative who has indicated his/her understanding and acceptance.     Dental advisory given  Plan Discussed with: CRNA  Anesthesia Plan Comments:        Anesthesia Quick Evaluation

## 2020-02-12 NOTE — Interval H&P Note (Signed)
History and Physical Interval Note:  02/12/2020 3:09 PM  Colin [name] THAYER YAZDANI  has presented today for surgery, with the diagnosis of Right shoulder rotator cuff tear.  The various methods of treatment have been discussed with the patient and family. After consideration of risks, benefits and other options for treatment, the patient has consented to  Procedure(s) with comments: SHOULDER ARTHROSCOPY WITH ROTATOR CUFF REPAIR with subacromial decompression distal clavicle resection biceps tenotomy labral debridement (Right) - interscalene block as a surgical intervention.  The patient's history has been reviewed, patient examined, no change in status, stable for surgery.  I have reviewed the patient's chart and labs.  Questions were answered to the patient's satisfaction.     Ansley Mangiapane ANDREW

## 2020-02-12 NOTE — H&P (View-Only) (Signed)
AssistedDr. Birdie Sons with right, ultrasound guided, adductor canal block. Side rails up, monitors on throughout procedure. See vital signs in flow sheet. Tolerated Procedure well.

## 2020-02-12 NOTE — Anesthesia Procedure Notes (Signed)
Procedure Name: Intubation Date/Time: 02/12/2020 12:58 PM Performed by: Montel Clock, CRNA Pre-anesthesia Checklist: Patient identified, Emergency Drugs available, Suction available, Patient being monitored and Timeout performed Patient Re-evaluated:Patient Re-evaluated prior to induction Oxygen Delivery Method: Circle system utilized Preoxygenation: Pre-oxygenation with 100% oxygen Induction Type: IV induction Ventilation: Mask ventilation without difficulty Laryngoscope Size: Mac and 4 Grade View: Grade II Tube type: Oral Tube size: 7.5 mm Number of attempts: 1 Airway Equipment and Method: Stylet Placement Confirmation: ETT inserted through vocal cords under direct vision,  positive ETCO2 and breath sounds checked- equal and bilateral Secured at: 22 cm Tube secured with: Tape Dental Injury: Teeth and Oropharynx as per pre-operative assessment  Comments: MAC 4 grade 2 view with downward laryngeal pressure. ETT easily passed.

## 2020-02-12 NOTE — Transfer of Care (Signed)
Immediate Anesthesia Transfer of Care Note  Patient: Colin [name]  Procedure(s) Performed: SHOULDER ARTHROSCOPY WITH ROTATOR CUFF REPAIR with subacromial decompression distal clavicle resection biceps tenotomy labral debridement (Right )  Patient Location: PACU  Anesthesia Type:General  Level of Consciousness: awake, alert  and oriented  Airway & Oxygen Therapy: Patient Spontanous Breathing and Patient connected to face mask oxygen  Post-op Assessment: Report given to RN and Post -op Vital signs reviewed and stable  Post vital signs: Reviewed and stable  Last Vitals:  Vitals Value Taken Time  BP 154/90 02/12/20 1523  Temp    Pulse 59 02/12/20 1524  Resp    SpO2 100 % 02/12/20 1524  Vitals shown include unvalidated device data.  Last Pain:  Vitals:   02/12/20 1013  TempSrc: Oral  PainSc: 0-No pain      Patients Stated Pain Goal: 3 ([phone])  Complications: No apparent anesthesia complications

## 2020-02-12 NOTE — Progress Notes (Signed)
AssistedDr. Birdie Sons with right, ultrasound guided, adductor canal block. Side rails up, monitors on throughout procedure. See vital signs in flow sheet. Tolerated Procedure well.

## 2020-02-12 NOTE — Anesthesia Procedure Notes (Signed)
Anesthesia Regional Block: Interscalene brachial plexus block   Pre-Anesthetic Checklist: ,, timeout performed, Correct Patient, Correct Site, Correct Laterality, Correct Procedure, Correct Position, site marked, Risks and benefits discussed, pre-op evaluation,  At surgeon's request and post-op pain management  Laterality: Right  Prep: Maximum Sterile Barrier Precautions used, chloraprep       Needles:  Injection technique: Single-shot  Needle Type: Echogenic Stimulator Needle     Needle Length: 9cm  Needle Gauge: 22     Additional Needles:   Procedures:,,,, ultrasound used (permanent image in chart),,,,  Narrative:  Start time: 02/12/2020 11:42 AM End time: 02/12/2020 11:45 AM Injection made incrementally with aspirations every 5 mL.  Performed by: Personally  Anesthesiologist: Brennan Bailey, MD  Additional Notes: Risks, benefits, and alternative discussed. Patient gave consent for procedure. Patient prepped and draped in sterile fashion. Sedation administered, patient remains easily responsive to voice. Relevant anatomy identified with ultrasound guidance. Local anesthetic given in 5cc increments with no signs or symptoms of intravascular injection. No pain or paraesthesias with injection. Patient monitored throughout procedure with signs of LAST or immediate complications. Tolerated well. Ultrasound image placed in chart.  Tawny Asal, MD

## 2020-02-12 NOTE — Discharge Instructions (Signed)
-  Please keep dressings on and dry for the next 3 days. Okay to remove them on day 3 and shower. Do not scrub at incision sites. Pat dry with a clean towel and place bandages over incision sites daily. -Leave immobilizer on until follow up appointment. Okay to remove 2-3 times a day and do gentle ROM of wrist and elbow.  -Start PT in 1 week -Prescriptions have been sent to your pharmacy ([facility] [address] in [place]) -It is also recommend that you take additional 100 mg of Tylenol alternating with 800 mg of Ibuprofen q 4-6 hrs and use the pain medication for break through severe pain.  -Follow up in the office in 2 weeks for suture removal.

## 2020-02-13 ENCOUNTER — Encounter: Payer: Self-pay | Admitting: *Deleted

## 2020-02-13 DIAGNOSIS — J849 Interstitial pulmonary disease, unspecified: Secondary | ICD-10-CM | POA: Diagnosis not present

## 2020-02-13 DIAGNOSIS — S46111A Strain of muscle, fascia and tendon of long head of biceps, right arm, initial encounter: Secondary | ICD-10-CM | POA: Diagnosis not present

## 2020-02-13 DIAGNOSIS — M19011 Primary osteoarthritis, right shoulder: Secondary | ICD-10-CM | POA: Diagnosis not present

## 2020-02-13 DIAGNOSIS — S46011A Strain of muscle(s) and tendon(s) of the rotator cuff of right shoulder, initial encounter: Secondary | ICD-10-CM | POA: Diagnosis not present

## 2020-02-13 DIAGNOSIS — K219 Gastro-esophageal reflux disease without esophagitis: Secondary | ICD-10-CM | POA: Diagnosis not present

## 2020-02-13 DIAGNOSIS — S43431A Superior glenoid labrum lesion of right shoulder, initial encounter: Secondary | ICD-10-CM | POA: Diagnosis not present

## 2020-02-13 NOTE — Op Note (Signed)
Preop diagnosis right shoulder large rotator cuff tear biceps tear AC joint arthritis Postop diagnosis right shoulder large retracted rotator cuff tear supraspinatus and infraspinatus subluxed and partial biceps tear superior labral tear AC joint arthritis Procedure #1 right shoulder arthroscopic rotator cuff repair #2 arthroscopic biceps tenotomy debridement #3 arthroscopic subacromial decompression #4 arthroscopic distal clavicle resection Surgeon Hart Robinsons, MD Assistant Jason Coop, PA-C Anesthesia interscalene block general Estimated blood loss minimal Drains none Complications none   Operative details patient was encountered in the holding area correct side identified marked signed appropriately chart reviewed interscalene block administered IV antibiotics were given within 1 hour of the surgical incision time.  Taken to the operating room placed under general anesthesia gently turned into a left lateral decubitus position meticulously position padded and bumped.  Prepped with chlor prep and draped in sterile fashion.  Utilized the [employer].  Appropriate longitudinal traction was applied without excessive traction.  Timeout confirmed posterior portal was created arthroscope placed into the glenohumeral joint.  Biceps was found to be significantly partially torn and subluxed medially tenotomy was performed biceps reduced to the intertubercular groove.  Superior labral tear identified debrided with a shaver and cautery.  Subacromial region revealed a thick subacromial bursa lateral portal was established neurovascular structures were protected subacromial subdeltoid bursectomy was performed.  Cautery was utilized to release the periosteum CA ligament utilizing cutting block technique anterior inferior and lateral acromioplasty was performed converting to a flat acromial morphology as the subacromial space had previously been some significantly compromised.  AC joint cannot  be osteoarthritic with underlying subclavicular spur excessive Interpore was then utilized for a distal clavicle resection approximately 5 to 8 mm making sure to leave the superior acromioclavicular ligaments, capsule intact the clavicles palpated no instability was detected.  Hemostasis obtained.  Supra and infraspinatus was identified there was noted significant rotator cuff tendinopathy without be of significant tissue remaining for repair.  Mobilized both intra-articular and extra-articular.  Soft tissues taken off the greater tuberosity with cautery with cautery and smoothed down with a bur vertebroplasty.  Small puncture was made superiorly Arthrex peek anchor was placed in the greater tuberosity of the supraspinatus repair of the puncture wound was made another peek anchor from Arthrex were placed into the greater tuberosity for the infraspinatus at this point time 8 limbs of suture were then placed through the rotator cuff at the appropriate level utilizing Xbox technique alternate sutures removed were taken from the anterior and posterior sutures placed into another swivel lock arm was abducted and to the greater tuberosity posteriorly and then anteriorly next block configuration this reduced the rotator cuff back to the greater tuberosity to bleeding bone without excessive tension.  Also make sure not to over mobilize the rotator cuff to prevent traction neuropraxia of the suprascapular nerve.  Satisfactory repair was indicated hemostasis obtained arthroscopic was removed taken out of traction.  Portals were closed with 4 nylon suture sterile dressing was applied placed into a short abduction sling turned supine awakened taken from the operating room to the PACU in stable condition.  To help with patient positioning prepping draping technical surgical assistant throughout entire case suture management wound closure application of dressing and sling Ms. Leanne Hoss, PA-C assistance was needed throughout  this entire procedure.

## 2020-02-13 NOTE — Progress Notes (Signed)
Subjective: 1 Day Post-Op Procedure(s) (LRB): SHOULDER ARTHROSCOPY WITH ROTATOR CUFF REPAIR with subacromial decompression distal clavicle resection biceps tenotomy labral debridement (Right) Patient reports pain as 1 on 0-10 scale.  Patient doing well, no complaints overnight Denies CP, SOB, N/V  Objective: Vital signs in last 24 hours: Temp:  [97.4 F (36.3 C)-99.1 F (37.3 C)] 98.1 F (36.7 C) (05/28 0523) Pulse Rate:  [50-98] 86 (05/28 0523) Resp:  [9-23] 17 (05/28 0205) BP: (117-177)/(76-103) 139/95 (05/28 0523) SpO2:  [96 %-100 %] 96 % (05/28 0523) Weight:  [121.6 kg] 121.6 kg (05/27 2140)  Intake/Output from previous day: 05/27 0701 - 05/28 0700 In: 2634.9 [P.O.:958; I.V.:1526.9; IV Piggyback:150] Out: 20 [Blood:20] Intake/Output this shift: No intake/output data recorded.  Recent Labs    02/11/20 1135  HGB 13.4   Recent Labs    02/11/20 1135  WBC 8.2  RBC 4.37  HCT 41.9  PLT 201   Recent Labs    02/11/20 1135  NA 140  K 4.4  CL 105  CO2 24  BUN 21  CREATININE 1.01  GLUCOSE 114*  CALCIUM 9.5   No results for input(s): LABPT, INR in the last 72 hours.  Neurologically intact Neurovascular intact Sensation intact distally Intact pulses distally Incision: dressing C/D/I arm in sling, still numb from block   Assessment/Plan: 1 Day Post-Op Procedure(s) (LRB): SHOULDER ARTHROSCOPY WITH ROTATOR CUFF REPAIR with subacromial decompression distal clavicle resection biceps tenotomy labral debridement (Right) -Discharge home this am -Meds have already been sent to pharmacy -Will follow up in the office in 2 weeks -Dressings okay to come off on Sunday and replaced with bandaids    Drue Novel, PA-C [facility] 02/13/2020, 7:34 AM

## 2020-02-18 NOTE — Discharge Summary (Signed)
Physician Discharge Summary  Patient ID: Colin [name] MRN: [id number] DOB/AGE: 01-23-1951 69 y.o.  Admit date: 02/12/2020 Discharge date: 02/18/2020  Admission Diagnoses: Right rotator cuff tear   Discharge Diagnoses:  Active Problems:   Right rotator cuff tear   Discharged Condition: stable  Hospital Course: patient was admitted on 5/27 for a right shoulder arthroscopy, subacromial decompression, distal clavicle resection, biceps debridement and rotator cuff. Patient tolerated surgery very well and was sent to PACU in stable condition.  Due to pre-existing heart condition he was sent to telemetry for overnight observation.  Did well overnight with no events.  Postop day 1 no complaints in the a.m.  He was sent home in stable condition.  He was sent home with antibiotics, pain medic, muscle relaxer.  He will follow up in the office in 2 weeks.  Needs to remain in the immobilizer until then.  Able to remove bandages at 3 days status post surgery..\  Consults: None  Significant Diagnostic Studies: none  Treatments: IV hydration, antibiotics: Ancef and analgesia: acetaminophen and oxycodone  Discharge Exam: Blood pressure (!) 139/95, pulse 86, temperature 98.1 F (36.7 C), temperature source Oral, resp. rate 17, height 6\' 1"  (1.854 m), weight 121.6 kg, SpO2 96 %. General appearance: alert, cooperative, appears stated age and no distress Extremities: extremities normal, atraumatic, no cyanosis or edema Pulses: 2+ and symmetric Skin: Skin color, texture, turgor normal. No rashes or lesions Neurologic: Alert and oriented X 3, normal strength and tone. Normal symmetric reflexes. Normal coordination and gait  Disposition: Discharge disposition: 01-Home or Self Care       Discharge Instructions    Call MD / Call 911   Complete by: As directed    If you experience chest pain or shortness of breath, CALL 911 and be transported to the hospital emergency room.  If you develope a fever  above 101 F, pus (white drainage) or increased drainage or redness at the wound, or calf pain, call your surgeon's office.   Constipation Prevention   Complete by: As directed    Drink plenty of fluids.  Prune juice may be helpful.  You may use a stool softener, such as Colace (over the counter) 100 mg twice a day.  Use MiraLax (over the counter) for constipation as needed.   Diet - low sodium heart healthy   Complete by: As directed    Discharge instructions   Complete by: As directed    -Please keep dressings on and dry for the next 3 days. Okay to remove them on day 3 and shower. Do not scrub at incision sites. Pat dry with a clean towel and place bandages over incision sites daily. -Leave immobilizer on until follow up appointment. Okay to remove 2-3 times a day and do gentle ROM of wrist and elbow.  -Start PT in 1 week -Prescriptions have been sent to your pharmacy [facility] [address] [place], [place] -It is also recommend that you take additional 100 mg of Tylenol alternating with 800 mg of Ibuprofen q 4-6 hrs and use the pain medication for break through severe pain.  -Follow up in the office in 2 weeks for suture removal.   Increase activity slowly as tolerated   Complete by: As directed    Remain in sling     Allergies as of 02/13/2020   No Known Allergies     Medication List    TAKE these medications   acetaminophen 500 MG tablet Commonly known as: TYLENOL Take 1,000  mg by mouth every 6 (six) hours as needed for mild pain or moderate pain.   albuterol 108 (90 Base) MCG/ACT inhaler Commonly known as: VENTOLIN HFA Inhale 2 puffs into the lungs every 6 (six) hours as needed for wheezing or shortness of breath.   aspirin EC 81 MG tablet Take 1 tablet (81 mg total) by mouth daily.   Fish Oil 1000 MG Caps Take 1,000 mg by mouth daily.   ibuprofen 200 MG tablet Commonly known as: ADVIL Take 400 mg by mouth every 6 (six) hours as needed for moderate pain.   levothyroxine 75 MCG  tablet Commonly known as: SYNTHROID Take 75 mcg by mouth at bedtime.   losartan-hydrochlorothiazide 50-12.5 MG tablet Commonly known as: HYZAAR Take 1 tablet by mouth daily.   lovastatin 40 MG tablet Commonly known as: MEVACOR Take 40 mg by mouth daily.   methocarbamol 500 MG tablet Commonly known as: Robaxin Take 1 tablet (500 mg total) by mouth 4 (four) times daily.   omeprazole 40 MG capsule Commonly known as: PRILOSEC Take 40 mg by mouth daily.   ondansetron 4 MG tablet Commonly known as: Zofran Take 1 tablet (4 mg total) by mouth daily as needed for nausea or vomiting.   oxyCODONE 5 MG immediate release tablet Commonly known as: Roxicodone Take 1 tablet (5 mg total) by mouth every 4 (four) hours as needed for up to 7 days.   predniSONE 10 MG tablet Commonly known as: DELTASONE Alternate 20mg  and 10mg  every other day until next visit What changed:   how much to take  how to take this  when to take this  additional instructions   rosuvastatin 10 MG tablet Commonly known as: CRESTOR Take 10 mg by mouth daily.   rosuvastatin 20 MG tablet Commonly known as: CRESTOR Take 1 tablet (20 mg total) by mouth daily.   sulfamethoxazole-trimethoprim 800-160 MG tablet Commonly known as: BACTRIM DS 1 tab Mon., Wed., Fri. What changed:   how much to take  how to take this  when to take this  additional instructions        Signed: Drue Novel 02/18/2020, 8:10 AM

## 2020-02-23 DIAGNOSIS — G629 Polyneuropathy, unspecified: Secondary | ICD-10-CM | POA: Diagnosis not present

## 2020-02-23 DIAGNOSIS — L814 Other melanin hyperpigmentation: Secondary | ICD-10-CM | POA: Diagnosis not present

## 2020-02-23 DIAGNOSIS — L570 Actinic keratosis: Secondary | ICD-10-CM | POA: Diagnosis not present

## 2020-02-23 DIAGNOSIS — R609 Edema, unspecified: Secondary | ICD-10-CM | POA: Diagnosis not present

## 2020-02-23 DIAGNOSIS — L821 Other seborrheic keratosis: Secondary | ICD-10-CM | POA: Diagnosis not present

## 2020-02-23 DIAGNOSIS — R600 Localized edema: Secondary | ICD-10-CM | POA: Diagnosis not present

## 2020-02-23 DIAGNOSIS — J8489 Other specified interstitial pulmonary diseases: Secondary | ICD-10-CM | POA: Diagnosis not present

## 2020-02-23 DIAGNOSIS — D2372 Other benign neoplasm of skin of left lower limb, including hip: Secondary | ICD-10-CM | POA: Diagnosis not present

## 2020-02-25 DIAGNOSIS — Z4789 Encounter for other orthopedic aftercare: Secondary | ICD-10-CM | POA: Diagnosis not present

## 2020-02-26 DIAGNOSIS — M25511 Pain in right shoulder: Secondary | ICD-10-CM | POA: Diagnosis not present

## 2020-02-26 DIAGNOSIS — Z4789 Encounter for other orthopedic aftercare: Secondary | ICD-10-CM | POA: Diagnosis not present

## 2020-03-01 ENCOUNTER — Ambulatory Visit (HOSPITAL_BASED_OUTPATIENT_CLINIC_OR_DEPARTMENT_OTHER)
Admission: RE | Admit: 2020-03-01 | Discharge: 2020-03-01 | Disposition: A | Discharge status: Home / Self Care | Payer: Medicare Other | Source: Ambulatory Visit | Attending: Primary Care | Admitting: Primary Care

## 2020-03-01 ENCOUNTER — Other Ambulatory Visit: Payer: Self-pay

## 2020-03-01 DIAGNOSIS — J8489 Other specified interstitial pulmonary diseases: Secondary | ICD-10-CM

## 2020-03-01 DIAGNOSIS — R0602 Shortness of breath: Secondary | ICD-10-CM | POA: Diagnosis not present

## 2020-03-02 DIAGNOSIS — Z4789 Encounter for other orthopedic aftercare: Secondary | ICD-10-CM | POA: Diagnosis not present

## 2020-03-02 DIAGNOSIS — M25511 Pain in right shoulder: Secondary | ICD-10-CM | POA: Diagnosis not present

## 2020-03-02 NOTE — Telephone Encounter (Signed)
Sent message back to triage  Should have echocardiogram ordered if he hasnt had one

## 2020-03-02 NOTE — Progress Notes (Signed)
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 Pulmonary nodules are also stable largest measuring 30mm, favored to reflect subpleural lymph nodes. No acute process. Evidence of coronary artery disease and pulmonary hypertension.   If he hasn't have an echocardiogram in the last 2 years we should order one.

## 2020-03-03 ENCOUNTER — Telehealth: Payer: Self-pay | Admitting: Primary Care

## 2020-03-03 NOTE — Telephone Encounter (Signed)
Received response from [facility], sent response to patient.

## 2020-03-15 ENCOUNTER — Other Ambulatory Visit: Payer: Self-pay

## 2020-03-15 ENCOUNTER — Ambulatory Visit (INDEPENDENT_AMBULATORY_CARE_PROVIDER_SITE_OTHER): Payer: Medicare Other | Admitting: Primary Care

## 2020-03-15 ENCOUNTER — Encounter: Payer: Self-pay | Admitting: Primary Care

## 2020-03-15 VITALS — BP 112/68 | HR 77 | Temp 98.1°F | Ht 73.0 in | Wt 271.4 lb

## 2020-03-15 DIAGNOSIS — J8489 Other specified interstitial pulmonary diseases: Secondary | ICD-10-CM

## 2020-03-15 DIAGNOSIS — G4734 Idiopathic sleep related nonobstructive alveolar hypoventilation: Secondary | ICD-10-CM

## 2020-03-15 DIAGNOSIS — M7989 Other specified soft tissue disorders: Secondary | ICD-10-CM | POA: Diagnosis not present

## 2020-03-15 DIAGNOSIS — M75101 Unspecified rotator cuff tear or rupture of right shoulder, not specified as traumatic: Secondary | ICD-10-CM

## 2020-03-15 DIAGNOSIS — M1611 Unilateral primary osteoarthritis, right hip: Secondary | ICD-10-CM

## 2020-03-15 DIAGNOSIS — R202 Paresthesia of skin: Secondary | ICD-10-CM | POA: Insufficient documentation

## 2020-03-15 NOTE — Assessment & Plan Note (Deleted)
-   Echocardiogram in February 2020 showed normal LV function  - Primary care did labs and BNP was reported normal

## 2020-03-15 NOTE — Assessment & Plan Note (Signed)
-   Secondary to fall; s/p right rotator cuff repair

## 2020-03-15 NOTE — Assessment & Plan Note (Signed)
-   He has more inactive to back/hip pain - RF and ANA negative with rheumatology  - He is following with Orthopedics and has an MRI scheduled

## 2020-03-15 NOTE — Patient Instructions (Addendum)
  Pulmonary fibrosis d/t Covid: - Taper prednisone 5mg  every other day x 1 week; then trial off steriod ( if shortness of breath returns notify office) - Stop Bactrim   Orders: ONO on room air (if normal we can discontinue oxygen)  Follow-up: - 6-8 weeks with [name] or sooner if needed - can be telephone visit or in office

## 2020-03-15 NOTE — Assessment & Plan Note (Addendum)
He is doing well, no re-occurrence for shortness of breath. 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 CT chest in June 2021 showed stable lung fibrosis.   Plan: - Taper prednisone 5mg  every other day x 1 week; then stop (patient to notify office if shortness of breath worsens off steriod) - Stop Bactrim  - Needs repeat PFTs in November 2021 - FU in 6-8 weeks with [name] or sooner if needed

## 2020-03-15 NOTE — Progress Notes (Signed)
@Patient  ID: Colin [name], male    DOB: December 30, 1950, 69 y.o.   MRN: [id number]  Chief Complaint  Patient presents with  . Follow-up    Wheezing in mornig sometimes     Referring provider: Antony Contras, MD  HPI:  69 year old male, never smoke. PMH significant for NSIP, hypertension, GERD. Patient of [name]. Hospitalized with COVID-19 pneumonia in September. Parenchymal changes on CT. Autoimmune work-up negative. He was started on steroids in November 2020, initial improvement with some recurrent shortness of breath with tapper.   Previous [facility] pulmonary encounter: 12/05/2019 Patient presents today for 2 months follow-up. He is doing well, breathing is mostly stable. He would like to taper off prednisone. He does reports some increased dyspnea when he was originally tapered from 50mg  of prednisone. He has been taking 30mg  and 20mg  every other day for the last 8 weeks and Bactrium DS MWF. He is due for repeat CT chest in June. He has some cough but reports that it is better than it was 2 weeks ago. He has only required his rescue inhaler once in the last several months. He does reports difficulty falling asleep every 4 days or so, states that he will take bendaryl and he is basically to re-charged.   01/01/2020 Patient presents today for 1 month follow-up/ He is doing well. His breathing is unchanged. He has not noticed a significant difference with prednisone taper. He would like to come off prednisone if possible. He is currently taking prednisone 20mg  alternating with 10mg  daily. He was having difficulty sleeping with higher steroid dose, this has improved. He reports rash, back pain and decreased sex drive while on prednisone. He continues Bactrim MWF. He has not needed to use albuterol rescue inhaler in a long times. He is not as physically active as he would like d/t chronic hip pain, he is going to physical therapy. Uses a cane to walk.   03/15/2020- Intriem hx Patient presents today  for 2 month follow-up/ oxygen check. Hx Covid pneumonia in September 2020. He was started or oral steroids in November. Initially had some improvement but experienced recurrence in shortness of breath with taper. Goal is to come off steroids completely. Prednisone tapered to 5mg  daily and continues Bactrim MWF. Repeat CT chest in June showed stable fibrosis of the lungs compared to January and likely d/t covid-19. No acute process, evidence of CAD and pulmonary HTN. He had an echocardiogram with Dr. Stanford Breed in February which showed normal LV function, mildly elevated pulmonary artery systolic pressure [id number]. He has been inactive d/t hip pain. He had surgery 4 weeks ago on right rotator cuff. He is scheduled for MRI ordered by orthopedics.  He has been seen by Rheumatology in the past for back pain. RF and ANA labs negative in November 2020. He reports back and bilateral hip pain. Neuropathy in both legs, worse in right. He is still wearing 2L oxygen at night, he does not feel like he needs it. Denies snoring or interruptions in his sleep. He has a follow up with Cardiology in late August. He has an apt with Neurology in August as well.   No Known Allergies  Immunization History  Administered Date(s) Administered  . DTaP 02/15/2010  . Influenza Split 07/24/2012  . Influenza, High Dose Seasonal PF 06/16/2019  . PFIZER SARS-COV-2 Vaccination 11/13/2019, 12/11/2019  . Pneumococcal Conjugate-13 04/30/2018  . Pneumococcal Polysaccharide-23 06/18/2019    Past Medical History:  Diagnosis Date  . Arthritis   .  Bowel trouble    Stool leakage since colonscopy 02/2016  . Cancer ([place])    skin cancer  . Carpal tunnel syndrome, bilateral   . Depression    in teenage years   . GERD (gastroesophageal reflux disease)   . Hyperlipemia   . Hypertension   . Hypothyroidism   . Interstitial lung disease ([place])   . Pneumonia   . Shortness of breath dyspnea    with bending over   . Wears glasses      Tobacco History: Social History   Tobacco Use  Smoking Status Never Smoker  Smokeless Tobacco Never Used   Counseling given: Not Answered   Outpatient Medications Prior to Visit  Medication Sig Dispense Refill  . acetaminophen (TYLENOL) 500 MG tablet Take 1,000 mg by mouth every 6 (six) hours as needed for mild pain or moderate pain.    Marland Kitchen albuterol (VENTOLIN HFA) 108 (90 Base) MCG/ACT inhaler Inhale 2 puffs into the lungs every 6 (six) hours as needed for wheezing or shortness of breath. 6.7 g 6  . aspirin EC 81 MG tablet Take 1 tablet (81 mg total) by mouth daily. 90 tablet 3  . levothyroxine (SYNTHROID) 75 MCG tablet Take 75 mcg by mouth at bedtime.     Marland Kitchen losartan-hydrochlorothiazide (HYZAAR) 50-12.5 MG per tablet Take 1 tablet by mouth daily.    . methocarbamol (ROBAXIN) 500 MG tablet Take 1 tablet (500 mg total) by mouth 4 (four) times daily. 30 tablet 0  . Omega-3 Fatty Acids (FISH OIL) 1000 MG CAPS Take 1,000 mg by mouth daily.     Marland Kitchen omeprazole (PRILOSEC) 40 MG capsule Take 40 mg by mouth daily.    . ondansetron (ZOFRAN) 4 MG tablet Take 1 tablet (4 mg total) by mouth daily as needed for nausea or vomiting. 30 tablet 1  . rosuvastatin (CRESTOR) 10 MG tablet Take 10 mg by mouth daily.    . rosuvastatin (CRESTOR) 20 MG tablet Take 1 tablet (20 mg total) by mouth daily. 90 tablet 1  . predniSONE (DELTASONE) 10 MG tablet Alternate 20mg  and 10mg  every other day until next visit (Patient taking differently: Take 5 mg by mouth daily. ) 60 tablet 0  . sulfamethoxazole-trimethoprim (BACTRIM DS) 800-160 MG tablet 1 tab Mon., Wed., Fri. (Patient taking differently: Take 1 tablet by mouth every Monday, Wednesday, and Friday. ) 36 tablet 0  . ibuprofen (ADVIL) 200 MG tablet Take 400 mg by mouth every 6 (six) hours as needed for moderate pain.     Marland Kitchen lovastatin (MEVACOR) 40 MG tablet Take 40 mg by mouth daily.      No facility-administered medications prior to visit.   Review of  Systems  Review of Systems  Respiratory: Negative for cough, shortness of breath and wheezing.   Musculoskeletal: Positive for arthralgias and back pain.   Physical Exam  BP 112/68 (BP Location: Left Arm, Cuff Size: Normal)   Pulse 77   Temp 98.1 F (36.7 C) (Oral)   Ht 6\' 1"  (1.854 m)   Wt 271 lb 6.4 oz (123.1 kg)   SpO2 96%   BMI 35.81 kg/m  Physical Exam Constitutional:      Appearance: Normal appearance.  HENT:     Head: Normocephalic and atraumatic.     Mouth/Throat:     Mouth: Mucous membranes are moist.     Pharynx: Oropharynx is clear.  Cardiovascular:     Rate and Rhythm: Normal rate and regular rhythm.     Comments: +Trace-1BLE  edema Pulmonary:     Effort: Pulmonary effort is normal.     Comments: Fine crackles to bilateral bases Musculoskeletal:     Comments: Right arm sling secondary to rotator cuff surgery; RLE 4/5 strength  Skin:    General: Skin is warm and dry.  Neurological:     General: No focal deficit present.     Mental Status: He is alert and oriented to person, place, and time. Mental status is at baseline.  Psychiatric:        Mood and Affect: Mood normal.        Behavior: Behavior normal.        Thought Content: Thought content normal.        Judgment: Judgment normal.      Lab Results:  CBC    Component Value Date/Time   WBC 8.2 02/11/2020 1135   RBC 4.37 02/11/2020 1135   HGB 13.4 02/11/2020 1135   HCT 41.9 02/11/2020 1135   PLT 201 02/11/2020 1135   MCV 95.9 02/11/2020 1135   MCH 30.7 02/11/2020 1135   MCHC 32.0 02/11/2020 1135   RDW 13.1 02/11/2020 1135   LYMPHSABS 0.5 (L) 05/27/2019 0155   MONOABS 0.5 05/27/2019 0155   EOSABS 0.0 05/27/2019 0155   BASOSABS 0.0 05/27/2019 0155    BMET    Component Value Date/Time   NA 140 02/11/2020 1135   K 4.4 02/11/2020 1135   CL 105 02/11/2020 1135   CO2 24 02/11/2020 1135   GLUCOSE 114 (H) 02/11/2020 1135   BUN 21 02/11/2020 1135   CREATININE 1.01 02/11/2020 1135   CALCIUM  9.5 02/11/2020 1135   GFRNONAA >60 02/11/2020 1135   GFRAA >60 02/11/2020 1135    BNP No results found for: BNP  ProBNP No results found for: PROBNP  Imaging: CT Chest Wo Contrast  Result Date: 03/01/2020 CLINICAL DATA:  COVID in September. Shortness of breath on exertion since. EXAM: CT CHEST WITHOUT CONTRAST TECHNIQUE: Multidetector CT imaging of the chest was performed following the standard protocol without IV contrast. COMPARISON:  1321 10/01/2019 FINDINGS: Cardiovascular: Aortic atherosclerosis. Normal heart size, without pericardial effusion. Multivessel coronary artery atherosclerosis. Pulmonary artery enlargement, outflow tract 3.2 cm Mediastinum/Nodes: No mediastinal or definite hilar adenopathy, given limitations of unenhanced CT. Tiny hiatal hernia. Lungs/Pleura: No pleural fluid. Similar appearance of slightly lower lung predominant areas of scattered subpleural ground-glass opacity, cylindrical bronchiectasis, mild architectural distortion, and subpleural bronchiolectasis. No superimposed acute consolidation. Scattered pulmonary nodules on the order of 5 mm and less are subpleural predominant, favored to represent subpleural lymph nodes. Upper Abdomen: Normal imaged portions of the liver, spleen, pancreas, gallbladder, adrenal glands, kidneys. Musculoskeletal: Lower cervical spine fixation. Moderate thoracic spondylosis. Convex right thoracic spine curvature. IMPRESSION: 1. 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 No evidence of superimposed acute process. 2. Coronary artery atherosclerosis. Aortic Atherosclerosis (ICD10-I70.0). 3. Pulmonary nodules are similar, favored to represent subpleural lymph nodes. Per consensus criteria, follow-up in November of 2021 can be considered if patient is high risk. This recommendation follows the consensus statement: Guidelines for Management of Incidental  Pulmonary Nodules Detected on CT Images:From the Fleischner Society 2017; published online before print (10.1148/radiol.1761607371). 4. Pulmonary artery enlargement suggests pulmonary arterial hypertension. Electronically Signed   By: Abigail Miyamoto M.D.   On: 03/01/2020 16:13     Assessment & Plan:   NSIP (nonspecific interstitial pneumonia) ([place]) He is doing well, no re-occurrence for shortness of breath. He  is tolerating 5mg  oral prednisone once daily for post-covid fibrosis. CT chest in June 2021 showed stable lung fibrosis.   Plan: - Taper prednisone 5mg  every other day x 1 week; then stop (patient to notify office if shortness of breath worsens off steriod) - Stop Bactrim  - Needs repeat PFTs in November 2021 - FU in 6-8 weeks with [name] or sooner if needed  OA (osteoarthritis) of hip - He has more inactive to back/hip pain - RF and ANA negative with rheumatology  - He is following with Orthopedics and has an MRI scheduled   Right rotator cuff tear - Secondary to fall; s/p right rotator cuff repair  Nocturnal hypoxia - No reports of snoring and interrupted sleep - Continues 2L oxygen at night time only - Check ONO on RA, if normal can discontinue oxygen   Bilateral leg paresthesia - He is having MRI spine with Orthopedics in July  - Patient has an apt with Neurology in August  Swelling of lower extremity - Echocardiogram in February 2020 showed normal LV function  - Primary care did labs and BNP was reported normal    Martyn Ehrich, NP 03/15/2020

## 2020-03-15 NOTE — Assessment & Plan Note (Addendum)
-   He is having MRI spine with Orthopedics in July  - Patient has an apt with Neurology in August

## 2020-03-15 NOTE — Assessment & Plan Note (Signed)
-   No reports of snoring and interrupted sleep - Continues 2L oxygen at night time only - Check ONO on RA, if normal can discontinue oxygen

## 2020-03-15 NOTE — Assessment & Plan Note (Signed)
-   Echocardiogram in February 2020 showed normal LV function  - Primary care did labs and BNP was reported normal

## 2020-03-18 DIAGNOSIS — G459 Transient cerebral ischemic attack, unspecified: Secondary | ICD-10-CM

## 2020-03-18 DIAGNOSIS — I639 Cerebral infarction, unspecified: Secondary | ICD-10-CM

## 2020-03-18 HISTORY — DX: Transient cerebral ischemic attack, unspecified: G45.9

## 2020-03-18 HISTORY — DX: Cerebral infarction, unspecified: I63.9

## 2020-03-23 ENCOUNTER — Emergency Department (HOSPITAL_COMMUNITY): Payer: Medicare Other

## 2020-03-23 ENCOUNTER — Other Ambulatory Visit: Payer: Self-pay

## 2020-03-23 ENCOUNTER — Inpatient Hospital Stay (HOSPITAL_COMMUNITY)
Admission: EM | Admit: 2020-03-23 | Discharge: 2020-03-25 | DRG: 042 | Disposition: A | Discharge status: Home / Self Care | Payer: Medicare Other | Attending: Internal Medicine | Admitting: Internal Medicine

## 2020-03-23 ENCOUNTER — Encounter (HOSPITAL_COMMUNITY): Payer: Self-pay | Admitting: Emergency Medicine

## 2020-03-23 DIAGNOSIS — I6503 Occlusion and stenosis of bilateral vertebral arteries: Secondary | ICD-10-CM | POA: Diagnosis not present

## 2020-03-23 DIAGNOSIS — I6523 Occlusion and stenosis of bilateral carotid arteries: Secondary | ICD-10-CM | POA: Diagnosis present

## 2020-03-23 DIAGNOSIS — Z20822 Contact with and (suspected) exposure to covid-19: Secondary | ICD-10-CM | POA: Diagnosis present

## 2020-03-23 DIAGNOSIS — G459 Transient cerebral ischemic attack, unspecified: Principal | ICD-10-CM | POA: Diagnosis present

## 2020-03-23 DIAGNOSIS — I10 Essential (primary) hypertension: Secondary | ICD-10-CM | POA: Diagnosis not present

## 2020-03-23 DIAGNOSIS — Z79899 Other long term (current) drug therapy: Secondary | ICD-10-CM

## 2020-03-23 DIAGNOSIS — I639 Cerebral infarction, unspecified: Secondary | ICD-10-CM | POA: Diagnosis present

## 2020-03-23 DIAGNOSIS — R531 Weakness: Secondary | ICD-10-CM | POA: Diagnosis not present

## 2020-03-23 DIAGNOSIS — E039 Hypothyroidism, unspecified: Secondary | ICD-10-CM | POA: Diagnosis not present

## 2020-03-23 DIAGNOSIS — Z8249 Family history of ischemic heart disease and other diseases of the circulatory system: Secondary | ICD-10-CM

## 2020-03-23 DIAGNOSIS — Z801 Family history of malignant neoplasm of trachea, bronchus and lung: Secondary | ICD-10-CM

## 2020-03-23 DIAGNOSIS — Z7982 Long term (current) use of aspirin: Secondary | ICD-10-CM

## 2020-03-23 DIAGNOSIS — E785 Hyperlipidemia, unspecified: Secondary | ICD-10-CM | POA: Diagnosis present

## 2020-03-23 DIAGNOSIS — Z6836 Body mass index (BMI) 36.0-36.9, adult: Secondary | ICD-10-CM

## 2020-03-23 DIAGNOSIS — E7800 Pure hypercholesterolemia, unspecified: Secondary | ICD-10-CM | POA: Diagnosis not present

## 2020-03-23 DIAGNOSIS — R29700 NIHSS score 0: Secondary | ICD-10-CM | POA: Diagnosis present

## 2020-03-23 DIAGNOSIS — Z79890 Hormone replacement therapy (postmenopausal): Secondary | ICD-10-CM

## 2020-03-23 DIAGNOSIS — Z96643 Presence of artificial hip joint, bilateral: Secondary | ICD-10-CM | POA: Diagnosis present

## 2020-03-23 DIAGNOSIS — Z803 Family history of malignant neoplasm of breast: Secondary | ICD-10-CM

## 2020-03-23 DIAGNOSIS — I959 Hypotension, unspecified: Secondary | ICD-10-CM | POA: Diagnosis not present

## 2020-03-23 DIAGNOSIS — E669 Obesity, unspecified: Secondary | ICD-10-CM | POA: Diagnosis present

## 2020-03-23 DIAGNOSIS — R29810 Facial weakness: Secondary | ICD-10-CM | POA: Diagnosis not present

## 2020-03-23 LAB — COMPREHENSIVE METABOLIC PANEL
ALT: 33 U/L (ref 0–44)
AST: 45 U/L — ABNORMAL HIGH (ref 15–41)
Albumin: 3.8 g/dL (ref 3.5–5.0)
Alkaline Phosphatase: 44 U/L (ref 38–126)
Anion gap: 13 (ref 5–15)
BUN: 19 mg/dL (ref 8–23)
CO2: 22 mmol/L (ref 22–32)
Calcium: 9.4 mg/dL (ref 8.9–10.3)
Chloride: 107 mmol/L (ref 98–111)
Creatinine, Ser: 1.11 mg/dL (ref 0.61–1.24)
GFR calc Af Amer: >60 mL/min (ref >60)
GFR calc non Af Amer: >60 mL/min (ref >60)
Glucose, Bld: 123 mg/dL — ABNORMAL HIGH (ref 70–99)
Potassium: 3.7 mmol/L (ref 3.5–5.1)
Sodium: 142 mmol/L (ref 135–145)
Total Bilirubin: 0.7 mg/dL (ref 0.3–1.2)
Total Protein: 6.9 g/dL (ref 6.5–8.1)

## 2020-03-23 LAB — CBC
HCT: 42.3 % (ref 39.0–52.0)
Hemoglobin: 13.4 g/dL (ref 13.0–17.0)
MCH: 30.5 pg (ref 26.0–34.0)
MCHC: 31.7 g/dL (ref 30.0–36.0)
MCV: 96.4 fL (ref 80.0–100.0)
Platelets: 194 10*3/uL (ref 150–400)
RBC: 4.39 MIL/uL (ref 4.22–5.81)
RDW: 13.1 % (ref 11.5–15.5)
WBC: 7 10*3/uL (ref 4.0–10.5)
nRBC: 0 % (ref 0.0–0.2)

## 2020-03-23 LAB — DIFFERENTIAL
Abs Immature Granulocytes: 0.03 10*3/uL (ref 0.00–0.07)
Basophils Absolute: 0.1 10*3/uL (ref 0.0–0.1)
Basophils Relative: 1 %
Eosinophils Absolute: 0.3 10*3/uL (ref 0.0–0.5)
Eosinophils Relative: 4 %
Immature Granulocytes: 0 %
Lymphocytes Relative: 23 %
Lymphs Abs: 1.6 10*3/uL (ref 0.7–4.0)
Monocytes Absolute: 0.6 10*3/uL (ref 0.1–1.0)
Monocytes Relative: 8 %
Neutro Abs: 4.5 10*3/uL (ref 1.7–7.7)
Neutrophils Relative %: 64 %

## 2020-03-23 LAB — PROTIME-INR
INR: 1.1 (ref 0.8–1.2)
Prothrombin Time: 13.3 seconds (ref 11.4–15.2)

## 2020-03-23 LAB — I-STAT CHEM 8, ED
BUN: 21 mg/dL (ref 8–23)
Calcium, Ion: 1.21 mmol/L (ref 1.15–1.40)
Chloride: 106 mmol/L (ref 98–111)
Creatinine, Ser: 1.1 mg/dL (ref 0.61–1.24)
Glucose, Bld: 123 mg/dL — ABNORMAL HIGH (ref 70–99)
HCT: 39 % (ref 39.0–52.0)
Hemoglobin: 13.3 g/dL (ref 13.0–17.0)
Potassium: 3.5 mmol/L (ref 3.5–5.1)
Sodium: 141 mmol/L (ref 135–145)
TCO2: 25 mmol/L (ref 22–32)

## 2020-03-23 LAB — CBG MONITORING, ED: Glucose-Capillary: 88 mg/dL (ref 70–99)

## 2020-03-23 LAB — SARS CORONAVIRUS 2 BY RT PCR (HOSPITAL ORDER, PERFORMED IN ~~LOC~~ HOSPITAL LAB): SARS Coronavirus 2: NEGATIVE

## 2020-03-23 LAB — APTT: aPTT: 32 seconds (ref 24–36)

## 2020-03-23 MED ORDER — SODIUM CHLORIDE 0.9% FLUSH
3.0000 mL | Freq: Two times a day (BID) | INTRAVENOUS | Status: DC
  Administered 2020-03-24 – 2020-03-25 (×3): 3 mL via INTRAVENOUS

## 2020-03-23 MED ORDER — LORAZEPAM 2 MG/ML IJ SOLN
1.0000 mg | Freq: Once | INTRAMUSCULAR | Status: AC
  Administered 2020-03-23: 1 mg via INTRAVENOUS
  Filled 2020-03-23: qty 1

## 2020-03-23 MED ORDER — SODIUM CHLORIDE 0.9% FLUSH
3.0000 mL | Freq: Once | INTRAVENOUS | Status: DC
Start: 2020-03-23 — End: 2020-03-25

## 2020-03-23 MED ORDER — LEVOTHYROXINE SODIUM 75 MCG PO TABS
75.0000 ug | ORAL_TABLET | Freq: Every day | ORAL | Status: DC
  Administered 2020-03-24 – 2020-03-25 (×2): 75 ug via ORAL
  Filled 2020-03-23 (×2): qty 1

## 2020-03-23 MED ORDER — CLOPIDOGREL BISULFATE 75 MG PO TABS
75.0000 mg | ORAL_TABLET | Freq: Once | ORAL | Status: AC
  Administered 2020-03-23: 75 mg via ORAL
  Filled 2020-03-23: qty 1

## 2020-03-23 MED ORDER — ASPIRIN 81 MG PO CHEW
324.0000 mg | CHEWABLE_TABLET | Freq: Once | ORAL | Status: AC
  Administered 2020-03-23: 324 mg via ORAL
  Filled 2020-03-23: qty 4

## 2020-03-23 MED ORDER — CLOPIDOGREL BISULFATE 75 MG PO TABS
75.0000 mg | ORAL_TABLET | Freq: Every day | ORAL | Status: DC
  Administered 2020-03-24: 75 mg via ORAL
  Filled 2020-03-23: qty 1

## 2020-03-23 MED ORDER — ROSUVASTATIN CALCIUM 20 MG PO TABS
20.0000 mg | ORAL_TABLET | Freq: Every day | ORAL | Status: DC
  Administered 2020-03-24 – 2020-03-25 (×2): 20 mg via ORAL
  Filled 2020-03-23 (×3): qty 1

## 2020-03-23 MED ORDER — ACETAMINOPHEN 500 MG PO TABS: 1000.0000 mg | ORAL_TABLET | Freq: Four times a day (QID) | ORAL | Status: DC | PRN

## 2020-03-23 MED ORDER — ENOXAPARIN SODIUM 40 MG/0.4ML ~~LOC~~ SOLN
40.0000 mg | Freq: Every day | SUBCUTANEOUS | Status: DC
  Administered 2020-03-24: 40 mg via SUBCUTANEOUS
  Filled 2020-03-23: qty 0.4

## 2020-03-23 MED ORDER — IOHEXOL 350 MG/ML SOLN
75.0000 mL | Freq: Once | INTRAVENOUS | Status: AC | PRN
  Administered 2020-03-23: 75 mL via INTRAVENOUS

## 2020-03-23 MED ORDER — ASPIRIN EC 81 MG PO TBEC
81.0000 mg | DELAYED_RELEASE_TABLET | Freq: Every day | ORAL | Status: DC
  Administered 2020-03-24: 81 mg via ORAL
  Filled 2020-03-23: qty 1

## 2020-03-23 NOTE — ED Notes (Signed)
Pt has noted weakness to right lower extremity, pt reports his primary orthopedic ordered MRI on extremity, pt has not received results for study at this time. Pt ambulates with assistive device (cane), and reports unsteadiness at times without assistance of ambulatory device.

## 2020-03-23 NOTE — ED Notes (Signed)
Family left pt belongings at beside.

## 2020-03-23 NOTE — ED Notes (Signed)
Pt reports having new onset of weakness describing of paralyzes to left upper extremity lasting about 3 minutes. Pt reports during this episode inability to control muscle movement with spasms. Pt reports similar milder episode about 6 months but denies seeking treatment. Pt denies of hx of TIA/CVA. Pt reports having a recent rotator cuff surgery to right shoulder and reports currently attending rehab for upper right extremity. As result unable to lift right upper extremity without assistance, this extremity weakness reflects in NIH assessment.

## 2020-03-23 NOTE — ED Triage Notes (Addendum)
Pt BIB [facility], c/o left arm weakness and numbness that started suddenly today, lasting 3 minutes and resolved. Pt has baseline right arm weakness due to recent rotator cuff surgery. Pt denies headache, chest pain/shortness of breath.

## 2020-03-23 NOTE — ED Provider Notes (Signed)
[facility] EMERGENCY DEPARTMENT Provider Note   CSN: [id number] Arrival date & time: 03/23/20  1552     History Chief Complaint  Patient presents with  . Extremity Weakness    Colin [name] is a 69 y.o. male.  HPI   Pt developed acute numbness and weakness in his left hand and the entire left arm that started this afternoon.  It lasted for about 3 minutes.  He could not really move the arm properly.  It also felt numb but that was mostly in the hand.  No trouble with speech. Maybe a little bit of blurred vision.  Some dizziness.  No sx since that time.    Past Medical History:  Diagnosis Date  . Arthritis   . Bowel trouble    Stool leakage since colonscopy 02/2016  . Cancer ([place])    skin cancer  . Carpal tunnel syndrome, bilateral   . Depression    in teenage years   . GERD (gastroesophageal reflux disease)   . Hyperlipemia   . Hypertension   . Hypothyroidism   . Interstitial lung disease ([place])   . Pneumonia   . Shortness of breath dyspnea    with bending over   . Wears glasses     Patient Active Problem List   Diagnosis Date Noted  . Swelling of lower extremity 03/15/2020  . Nocturnal hypoxia 03/15/2020  . Bilateral leg paresthesia 03/15/2020  . Right rotator cuff tear 02/12/2020  . NSIP (nonspecific interstitial pneumonia) ([place]) 07/31/2019  . History of 2019 novel coronavirus disease (COVID-19) 07/27/2019  . Hypothyroidism 05/19/2019  . GERD (gastroesophageal reflux disease) 05/19/2019  . Hyperlipidemia 05/19/2019  . OA (osteoarthritis) of hip 04/05/2016  . Dyspnea 04/19/2012  . Hypertension     Past Surgical History:  Procedure Laterality Date  . APPENDECTOMY    . CARPAL TUNNEL RELEASE  04/23/2012   Procedure: CARPAL TUNNEL RELEASE;  Surgeon: Cammie Sickle., MD;  Location: [facility];  Service: Orthopedics;  Laterality: Left;  . CARPAL TUNNEL RELEASE  06/27/2012   Procedure: CARPAL TUNNEL RELEASE;  Surgeon: Cammie Sickle., MD;  Location: [facility];  Service: Orthopedics;  Laterality: Right;  . CERVICAL DISCECTOMY  2007  . colonscopy     polyps removed / benign  . HERNIA REPAIR    . INCISIONAL HERNIA REPAIR  2011   subcostal from append  . JOINT REPLACEMENT  81,87   lt total hip  . LAPAROSCOPIC APPENDECTOMY  2010   required open repair  . SHOULDER ARTHROSCOPY WITH ROTATOR CUFF REPAIR Right 02/12/2020   Procedure: SHOULDER ARTHROSCOPY WITH ROTATOR CUFF REPAIR with subacromial decompression distal clavicle resection biceps tenotomy labral debridement;  Surgeon: Sydnee Cabal, MD;  Location: [facility];  Service: Orthopedics;  Laterality: Right;  interscalene block  . TONSILLECTOMY    . TOTAL HIP ARTHROPLASTY     x3 left hip [id number]  . TOTAL HIP ARTHROPLASTY Right 04/05/2016   Procedure: RIGHT TOTAL HIP ARTHROPLASTY ANTERIOR APPROACH;  Surgeon: Gaynelle Arabian, MD;  Location: [facility];  Service: Orthopedics;  Laterality: Right;       Family History  Problem Relation Age of Onset  . Breast cancer Mother   . Hypertension Mother   . Heart disease Father   . Lung cancer Father   . Hypertension Father   . Breast cancer Sister     Social History   Tobacco Use  . Smoking status: Never Smoker  . Smokeless  tobacco: Never Used  Vaping Use  . Vaping Use: Never used  Substance Use Topics  . Alcohol use: Yes    Comment: ocassionally  . Drug use: No    Home Medications Prior to Admission medications   Medication Sig Start Date End Date Taking? Authorizing Provider  acetaminophen (TYLENOL) 500 MG tablet Take 1,000 mg by mouth every 6 (six) hours as needed for mild pain or moderate pain.    [provider]  albuterol (VENTOLIN HFA) 108 (90 Base) MCG/ACT inhaler Inhale 2 puffs into the lungs every 6 (six) hours as needed for wheezing or shortness of breath. 07/02/19   Margaretha Seeds, MD  aspirin EC 81 MG tablet Take 1 tablet (81 mg total) by mouth daily. 10/09/19    Lelon Perla, MD  ibuprofen (ADVIL) 200 MG tablet Take 400 mg by mouth every 6 (six) hours as needed for moderate pain.     [provider]  levothyroxine (SYNTHROID) 75 MCG tablet Take 75 mcg by mouth at bedtime.     [provider]  losartan-hydrochlorothiazide (HYZAAR) 50-12.5 MG per tablet Take 1 tablet by mouth daily.    [provider]  lovastatin (MEVACOR) 40 MG tablet Take 40 mg by mouth daily.     [provider]  methocarbamol (ROBAXIN) 500 MG tablet Take 1 tablet (500 mg total) by mouth 4 (four) times daily. 02/12/20   Haus, Cordelia Pen, PA  Omega-3 Fatty Acids (FISH OIL) 1000 MG CAPS Take 1,000 mg by mouth daily.     [provider]  omeprazole (PRILOSEC) 40 MG capsule Take 40 mg by mouth daily.    [provider]  ondansetron (ZOFRAN) 4 MG tablet Take 1 tablet (4 mg total) by mouth daily as needed for nausea or vomiting. 02/12/20 02/11/21  Elizabeth Sauer R, PA  rosuvastatin (CRESTOR) 10 MG tablet Take 10 mg by mouth daily.    [provider]  rosuvastatin (CRESTOR) 20 MG tablet Take 1 tablet (20 mg total) by mouth daily. 02/10/20   Lelon Perla, MD    Allergies    Patient has no known allergies.  Review of Systems   Review of Systems  Musculoskeletal:       Leg swelling  Neurological: Positive for numbness (in toes and feet scheduled for neurology appointment).  All other systems reviewed and are negative.   Physical Exam Updated Vital Signs BP 108/84 (BP Location: Right Arm)   Pulse 66   Temp 98.2 F (36.8 C) (Oral)   Resp 16   SpO2 99%   Physical Exam Vitals and nursing note reviewed.  Constitutional:      General: He is not in acute distress.    Appearance: He is well-developed.  HENT:     Head: Normocephalic and atraumatic.     Right Ear: External ear normal.     Left Ear: External ear normal.  Eyes:     General: No scleral icterus.       Right eye: No discharge.        Left eye: No  discharge.     Conjunctiva/sclera: Conjunctivae normal.  Neck:     Trachea: No tracheal deviation.  Cardiovascular:     Rate and Rhythm: Normal rate and regular rhythm.  Pulmonary:     Effort: Pulmonary effort is normal. No respiratory distress.     Breath sounds: Normal breath sounds. No stridor. No wheezing or rales.  Abdominal:     General: Bowel sounds are  normal. There is no distension.     Palpations: Abdomen is soft.     Tenderness: There is no abdominal tenderness. There is no guarding or rebound.  Musculoskeletal:        General: No tenderness.     Cervical back: Neck supple.  Skin:    General: Skin is warm and dry.     Findings: No rash.  Neurological:     Mental Status: He is alert and oriented to person, place, and time.     Cranial Nerves: No cranial nerve deficit (No facial droop, extraocular movements intact, tongue midline ).     Sensory: No sensory deficit.     Motor: No abnormal muscle tone or seizure activity.     Coordination: Coordination normal.     Comments: Unable to lift right arm off bed completely (prior rotator cuff surgery),unable to hold both right off bed for 5 seconds, sensation intact in all extremities, no visual field cuts, no left or right sided neglect, normal finger-nose exam bilaterally, no nystagmus noted      ED Results / Procedures / Treatments   Labs (all labs ordered are listed, but only abnormal results are displayed) Labs Reviewed  COMPREHENSIVE METABOLIC PANEL - Abnormal; Notable for the following components:      Result Value   Glucose, Bld 123 (*)    AST 45 (*)    All other components within normal limits  I-STAT CHEM 8, ED - Abnormal; Notable for the following components:   Glucose, Bld 123 (*)    All other components within normal limits  SARS CORONAVIRUS 2 BY RT PCR (HOSPITAL ORDER, [facility])  PROTIME-INR  APTT  CBC  DIFFERENTIAL  CBG MONITORING, ED    EKG EKG Interpretation   Date/Time:  Tuesday March 23 2020 15:56:15 EDT Ventricular Rate:  80 PR Interval:  162 QRS Duration: 86 QT Interval:  356 QTC Calculation: 410 R Axis:   -20 Text Interpretation: Normal sinus rhythm Nonspecific T wave abnormality Abnormal ECG No significant change since last tracing Confirmed by Dorie Rank [phone]) on 03/23/2020 7:46:14 PM   Radiology CT HEAD WO CONTRAST  Result Date: 03/23/2020 CLINICAL DATA:  Neuro deficit, acute, stroke suspected Possible stroke Patient reports left arm weakness and numbness, onset today. Symptoms lasted 3 minutes and now resolved. EXAM: CT HEAD WITHOUT CONTRAST TECHNIQUE: Contiguous axial images were obtained from the base of the skull through the vertex without intravenous contrast. COMPARISON:  None. FINDINGS: Brain: Age related atrophy. No intracranial hemorrhage, mass effect, or midline shift. No hydrocephalus. The basilar cisterns are patent. No evidence of territorial infarct or acute ischemia. No extra-axial or intracranial fluid collection. Vascular: Atherosclerosis of skullbase vasculature without hyperdense vessel or abnormal calcification. Skull: No fracture or focal lesion. Sinuses/Orbits: Paranasal sinuses and mastoid air cells are clear. The visualized orbits are unremarkable. Other: None. IMPRESSION: Unremarkable head CT for age. Electronically Signed   By: Keith Rake M.D.   On: 03/23/2020 17:44    Procedures Procedures (including critical care time)  Medications Ordered in ED Medications  sodium chloride flush (NS) 0.9 % injection 3 mL (has no administration in time range)  LORazepam (ATIVAN) injection 1 mg (has no administration in time range)  aspirin chewable tablet 324 mg (has no administration in time range)  clopidogrel (PLAVIX) tablet 75 mg (has no administration in time range)    ED Course  I have reviewed the triage vital signs and the nursing notes.  Pertinent  labs & imaging results that were available during my care of  the patient were reviewed by me and considered in my medical decision making (see chart for details).  Clinical Course as of Mar 23 2042  Tue Mar 23, 2020  2040 Labs reviewed.  No significant abnormalities.   [JK]  2041 Normal  Comprehensive metabolic panel(!) [JK]  7510 Normal  CBC [JK]  2041 Normal  Protime-INR [JK]  2042 No acute findings noted on head CT.   [JK]    Clinical Course User Index [JK] Dorie Rank, MD   MDM Rules/Calculators/A&P                          Patient presented with transient unilateral arm weakness.  Symptoms concerning for the possibility of TIA, occult stroke.  Patient does have some neurologic findings at this time but seem to affect more his right side rather than the left side there was affected today.  He does have some chronic deficits associated with radiculopathy as well as prior rotator cuff surgery.  This accounts for the right-sided symptoms.  Patient's ED work-up does not show any acute findings on head CT however symptoms are concerning.  Discussed case with Dr. Malen Gauze.  Recommends admission.  Start aspirin and Plavix.  He will consult on the patient.  Proceed with TIA work-up.  I will consult the medical service for further treatment admission. Final Clinical Impression(s) / ED Diagnoses Final diagnoses:  TIA (transient ischemic attack)      Dorie Rank, MD 03/23/20 2043

## 2020-03-23 NOTE — H&P (Signed)
History and Physical    PLEASE NOTE THAT DRAGON DICTATION SOFTWARE WAS USED IN THE CONSTRUCTION OF THIS NOTE.   Colin [name] TKP:[id number] DOB: Aug 05, 1951 DOA: 03/23/2020  PCP: Antony Contras, MD Patient coming from: home   I have personally briefly reviewed patient's old medical records in [place]  Chief Complaint: left arm weakness  HPI: Colin [name] is a 69 y.o. male with medical history significant for HTN, HLD, who is admitted to [facility] on 03/23/2020 with suspected TIA after presenting from home to [facility] Emergency Department complaining of left arm weakness.   The patient reports sudden onset of left upper weakness around 1430 this afternoon (03/23/20), with concominant numbness, which he believes was limited to the left hand. Reports that these symptoms lasted approximately 3-4 minutes before spontaneous resolution and no ensuing return of these symptoms. He reports chronic right upper extremity weakness in the setting of rotator cuff pathology, and denies any acute worsening of this RUE weakness. Reports chronic bilateral lower extremity paresthesias in the b/l feet, without any acute worsening thereof. Otherwise, the patient denies any acute focal weakness, paresthesias, numbness, dysphagia, dizziness, vertigo, nausea, vomiting, change in vision, blurry vision, diplopia, word finding difficulties, or headache. Denies facial droop or slurring of speech. Denies any associated chest pain, shortness of breath, palpitations, diaphoresis, presyncope, or syncope.  Denies any previous history of stroke or TIA. In terms of modifiable risk factors, the patient acknowledges a history of hypertension and hyperlipidemia, but denies any known h/o diabetes, atrial fibrillation, or OSA. Reports that he is lifelong non-smoker.  Reports good compliance with her outpatient anti-hypertensive medications as well as his home statin. He reports that he is on a daily baby aspirin,  with most recent dose occurring on morning of 03/23/20. Otherwise, on no additional antiplatelet or anticoagulation medications at this time.   Denies any subjective fever, chills, rigors, or generalized myalgias. Denies any recent, neck stiffness, rhinitis, rhinorrhea, sore throat, sob, cough, abdominal pain, diarrhea, or rash. Denies dysuria, gross hematuria, or change in urinary urgency/frequency.  Denies any recent chest pain, diaphoresis, or palpitations.    ED Course:  Vital signs in the ED were notable for the following: Tmax 98.9; HR 66-77; BP 124/81 - 134/86; RR 15-16; O2 98-100% on RA.   Labs were notable for the following: CMP notable for the following: sodium 142, bicarbonate 22, creatinine 1.11; glucose 123. CBC notable for wbc of 7. INR 1.1. Routine screening nasopharyneal COVID PCR checked in ED this evening, with result currently pending.   Non-contrast CT head showed no evidence of acute intracranial process, including no evidence of acute intracranial hemmorhage or acute ischemic infarct. EKG shows NSR with HR 80, normal intervals, and no evidence of T wave or ST changes.   Case was discussed with on-call neurologist, Dr. Malen Gauze, who suspects TIA based upon the above presentation, and confirms that patient is not a candidate for TPA given interval complete resolution of presenting suspected acute neurologic deficit. Dr. Malen Gauze recommends overnight admission to the hospitalist service for further work-up and management of suspected TIA, including checking MRI of the brain, CTA of head/neck, as well as for evaluation of potential modifiable CVA risk factors. He also recommends dual anti-platelet therapy with asa and plavix and plans to evaluate the patient in person.   While in the ED, the following were administered: ASA 324 mg po x1, plavix 75 mg PO x 1, and Ativan 1 mg IV x 1.  Review of Systems: As per HPI otherwise 10 point review of systems negative.   Past Medical  History:  Diagnosis Date  . Arthritis   . Bowel trouble    Stool leakage since colonscopy 02/2016  . Cancer ([place])    skin cancer  . Carpal tunnel syndrome, bilateral   . Depression    in teenage years   . GERD (gastroesophageal reflux disease)   . Hyperlipemia   . Hypertension   . Hypothyroidism   . Interstitial lung disease ([place])   . Pneumonia   . Shortness of breath dyspnea    with bending over   . Wears glasses     Past Surgical History:  Procedure Laterality Date  . APPENDECTOMY    . CARPAL TUNNEL RELEASE  04/23/2012   Procedure: CARPAL TUNNEL RELEASE;  Surgeon: Cammie Sickle., MD;  Location: [place];  Service: Orthopedics;  Laterality: Left;  . CARPAL TUNNEL RELEASE  06/27/2012   Procedure: CARPAL TUNNEL RELEASE;  Surgeon: Cammie Sickle., MD;  Location: [place];  Service: Orthopedics;  Laterality: Right;  . CERVICAL DISCECTOMY  2007  . colonscopy     polyps removed / benign  . HERNIA REPAIR    . INCISIONAL HERNIA REPAIR  2011   subcostal from append  . JOINT REPLACEMENT  81,87   lt total hip  . LAPAROSCOPIC APPENDECTOMY  2010   required open repair  . SHOULDER ARTHROSCOPY WITH ROTATOR CUFF REPAIR Right 02/12/2020   Procedure: SHOULDER ARTHROSCOPY WITH ROTATOR CUFF REPAIR with subacromial decompression distal clavicle resection biceps tenotomy labral debridement;  Surgeon: Sydnee Cabal, MD;  Location: [facility];  Service: Orthopedics;  Laterality: Right;  interscalene block  . TONSILLECTOMY    . TOTAL HIP ARTHROPLASTY     x3 left hip [phone]  . TOTAL HIP ARTHROPLASTY Right 04/05/2016   Procedure: RIGHT TOTAL HIP ARTHROPLASTY ANTERIOR APPROACH;  Surgeon: Gaynelle Arabian, MD;  Location: [facility];  Service: Orthopedics;  Laterality: Right;    Social History:  reports that he has never smoked. He has never used smokeless tobacco. He reports current alcohol use. He reports that he does not use drugs.   No Known  Allergies  Family History  Problem Relation Age of Onset  . Breast cancer Mother   . Hypertension Mother   . Heart disease Father   . Lung cancer Father   . Hypertension Father   . Breast cancer Sister     Prior to Admission medications   Medication Sig Start Date End Date Taking? Authorizing Provider  acetaminophen (TYLENOL) 500 MG tablet Take 1,000 mg by mouth every 6 (six) hours as needed for mild pain or moderate pain.    [provider]  albuterol (VENTOLIN HFA) 108 (90 Base) MCG/ACT inhaler Inhale 2 puffs into the lungs every 6 (six) hours as needed for wheezing or shortness of breath. 07/02/19   Margaretha Seeds, MD  aspirin EC 81 MG tablet Take 1 tablet (81 mg total) by mouth daily. 10/09/19   Lelon Perla, MD  ibuprofen (ADVIL) 200 MG tablet Take 400 mg by mouth every 6 (six) hours as needed for moderate pain.     [provider]  levothyroxine (SYNTHROID) 75 MCG tablet Take 75 mcg by mouth at bedtime.     [provider]  losartan-hydrochlorothiazide (HYZAAR) 50-12.5 MG per tablet Take 1 tablet by mouth daily.    [provider]  lovastatin (MEVACOR) 40 MG tablet Take  40 mg by mouth daily.     [provider]  methocarbamol (ROBAXIN) 500 MG tablet Take 1 tablet (500 mg total) by mouth 4 (four) times daily. 02/12/20   Haus, Cordelia Pen, PA  Omega-3 Fatty Acids (FISH OIL) 1000 MG CAPS Take 1,000 mg by mouth daily.     [provider]  omeprazole (PRILOSEC) 40 MG capsule Take 40 mg by mouth daily.    [provider]  ondansetron (ZOFRAN) 4 MG tablet Take 1 tablet (4 mg total) by mouth daily as needed for nausea or vomiting. 02/12/20 02/11/21  Elizabeth Sauer R, PA  rosuvastatin (CRESTOR) 10 MG tablet Take 10 mg by mouth daily.    [provider]  rosuvastatin (CRESTOR) 20 MG tablet Take 1 tablet (20 mg total) by mouth daily. 02/10/20   Lelon Perla, MD     Objective    Physical Exam: Vitals:   03/23/20  1557 03/23/20 1655 03/23/20 1731 03/23/20 1812  BP: (!) 128/110 124/81 134/86 108/84  Pulse: 77 72 70 66  Resp: 15 15 16 16   Temp: 98.3 F (36.8 C) 98.9 F (37.2 C) 98.6 F (37 C) 98.2 F (36.8 C)  TempSrc: Oral Oral Oral Oral  SpO2: 100% 98% 99% 99%    General: appears to be stated age; alert, oriented Skin: warm, dry, no rash Head:  AT/[place] Eyes:  PEARL b/l, EOMI Mouth:  Oral mucosa membranes appear moist, normal dentition Neck: supple; trachea midline Heart:  RRR; did not appreciate any M/R/G Lungs: CTAB, did not appreciate any wheezes, rales, or rhonchi Abdomen: + BS; soft, ND, NT Vascular: 2+ pedal pulses b/l; 2+ radial pulses b/l Extremities: trace edema in b/l LE's; no muscle wasting Neuro: 5/5 strength of the proximal and distal flexors and extensors of the lower extremities bilaterally; 4/5 strength of the proximal and distal flexors and extensors of the RUE; 5/5 strength of the proximal and distal flexors and extensors of the LUE; sensation intact in upper extremities, b/l; cranial nerves II through XII grossly intact; no pronator drift; no evidence sugge2+ DTR's in upper and lower extremities bilaterally.    Labs on Admission: I have personally reviewed following labs and imaging studies  CBC: Recent Labs  Lab 03/23/20 1617 03/23/20 1652  WBC 7.0  --   NEUTROABS 4.5  --   HGB 13.4 13.3  HCT 42.3 39.0  MCV 96.4  --   PLT 194  --    Basic Metabolic Panel: Recent Labs  Lab 03/23/20 1617 03/23/20 1652  NA 142 141  K 3.7 3.5  CL 107 106  CO2 22  --   GLUCOSE 123* 123*  BUN 19 21  CREATININE 1.11 1.10  CALCIUM 9.4  --    GFR: Estimated Creatinine Clearance: 89.6 mL/min (by C-G formula based on SCr of 1.1 mg/dL). Liver Function Tests: Recent Labs  Lab 03/23/20 1617  AST 45*  ALT 33  ALKPHOS 44  BILITOT 0.7  PROT 6.9  ALBUMIN 3.8   No results for input(s): LIPASE, AMYLASE in the last 168 hours. No results for input(s): AMMONIA in the last 168  hours. Coagulation Profile: Recent Labs  Lab 03/23/20 1617  INR 1.1   Cardiac Enzymes: No results for input(s): CKTOTAL, CKMB, CKMBINDEX, TROPONINI in the last 168 hours. BNP (last 3 results) No results for input(s): PROBNP in the last 8760 hours. HbA1C: No results for input(s): HGBA1C in the last 72 hours. CBG: No results for input(s): GLUCAP in the last 168  hours. Lipid Profile: No results for input(s): CHOL, HDL, LDLCALC, TRIG, CHOLHDL, LDLDIRECT in the last 72 hours. Thyroid Function Tests: No results for input(s): TSH, T4TOTAL, FREET4, T3FREE, THYROIDAB in the last 72 hours. Anemia Panel: No results for input(s): VITAMINB12, FOLATE, FERRITIN, TIBC, IRON, RETICCTPCT in the last 72 hours. Urine analysis:    Component Value Date/Time   COLORURINE YELLOW 04/05/2016 [place] 04/05/2016 0544   LABSPEC 1.016 04/05/2016 0544   PHURINE 5.5 04/05/2016 0544   GLUCOSEU NEGATIVE 04/05/2016 0544   HGBUR NEGATIVE 04/05/2016 0544   BILIRUBINUR NEGATIVE 04/05/2016 0544   KETONESUR NEGATIVE 04/05/2016 0544   PROTEINUR NEGATIVE 04/05/2016 0544   UROBILINOGEN 0.2 10/22/2009 1230   NITRITE NEGATIVE 04/05/2016 0544   LEUKOCYTESUR NEGATIVE 04/05/2016 0544    Radiological Exams on Admission: CT HEAD WO CONTRAST  Result Date: 03/23/2020 CLINICAL DATA:  Neuro deficit, acute, stroke suspected Possible stroke Patient reports left arm weakness and numbness, onset today. Symptoms lasted 3 minutes and now resolved. EXAM: CT HEAD WITHOUT CONTRAST TECHNIQUE: Contiguous axial images were obtained from the base of the skull through the vertex without intravenous contrast. COMPARISON:  None. FINDINGS: Brain: Age related atrophy. No intracranial hemorrhage, mass effect, or midline shift. No hydrocephalus. The basilar cisterns are patent. No evidence of territorial infarct or acute ischemia. No extra-axial or intracranial fluid collection. Vascular: Atherosclerosis of skullbase vasculature  without hyperdense vessel or abnormal calcification. Skull: No fracture or focal lesion. Sinuses/Orbits: Paranasal sinuses and mastoid air cells are clear. The visualized orbits are unremarkable. Other: None. IMPRESSION: Unremarkable head CT for age. Electronically Signed   By: Keith Rake M.D.   On: 03/23/2020 17:44     EKG: Independently reviewed, with result as described above.    Assessment/Plan   Colin [name] is a 69 y.o. male with medical history significant for HTN, HLD, who is admitted to [facility] on 03/23/2020 with suspected TIA after presenting from home to [facility] Emergency Department complaining of left arm weakness.    Principal Problem:   TIA (transient ischemic attack) Active Problems:   Hypertension   Hypothyroidism   Hyperlipidemia   #) TIA: diagnosis suspected on basis of transient, self-limited acute LUE weakness and left hand numbness around 1430 on 03/23/20, which has subsequently resolved without recurrence, and in the absence of any additional evidence of acute focal neurologic deficit, while presenting CT head showed no evidence of acute intracranial process, including no evidence of acute intracranial hemorrhage. Case discussed with on-call neurologist, Dr. Malen Gauze, who suspects TIA based upon the above presentation, and confirms that patient is not a candidate for TPA given interval complete resolution of presenting suspected acute neurologic deficit. Dr. Malen Gauze recommends overnight admission to the hospitalist service for further work-up and management of suspected TIA, including checking MRI of the brain, CTA of head/neck, as well as for evaluation of potential modifiable CVA risk factors. As such, recommendations by the telestroke neurologist also include checking A1c, lipid panel, echocardiogram with bubble study, and overnight telemetry monitoring. He also recommends dual anti-platelet therapy with asa and plavix, with first doses of such  administered in the ED this evening. This is relative to patient's baseline anti-platelet therapy of a daly baby aspirin, as above.   The patient possesses multiple modifiable CVA risk factors including a history of  hypertension, hyperlipidemia. No known h/o tobacco abuse, OSA, or paroxsymal atrial fibrillation.  EKG in ED today showed NSR, as above.     Plan: Nursing  bedside swallow evaluation x 1 now, and will not initiate oral medications or diet until the patient has passed this. Head of the bed at 30 degrees. Neuro checks per protocol. VS per protocol. Will allow for permissive hypertension for 24-48 hours following onset of acute focal neurologic deficits, Monitor on telemetry, including monitoring for atrial fibrillation as modifiable risk factor for acute ischemic CVA. Check lipid panel in morning. Check hemoglobin A1c. PT/OT consults have been ordered to occur in the morning. TTE with bubble study has been ordered for the morning to evaluate for intracardiac thrombus, septal wall aneurysm, or septal wall defect. MRI brain and CTA head/neck, per neurology recommendations. Dual-anti-platelet therapy with ASA and plavix, per neuro recs. Continue home rosuvastatin, with next dose now. Dr. Malen Gauze to formally consult on patient.      #) Essential hypertension: outpatient anti-hypertensive regimen includes losartan/hctz. Presenting systolic BP's in the range of 120's to 130's mmHg. In setting of suspected presenting TIA, will observe 24-48 hours of permissive hypertension, as further detailed above.   Plan: holding home anti-hypertensive meds in observance of permissive htn, as above. Close monitoring of BP via stroke protocol.      #) Hyperlipidemia: on rosuvastatin as outpatient.   Plan: check lipid panel as component of TIA work-up, as above. Continue home statin.      #) Acquired hypothyroidism: on synthroid as outpatient.   Plan: continue home synthroid and check TSH.     DVT  prophylaxis: Lovenox 40 mg sq Qdaily.  Code Status: Full code Family Communication: none Disposition Plan: Per Rounding Team Consults called: case discussed with Dr. Malen Gauze, as above.   Admission status: observation;     PLEASE NOTE THAT DRAGON DICTATION SOFTWARE WAS USED IN THE CONSTRUCTION OF THIS NOTE.   Rhetta Mura DO [facility] Pager [phone] From [place]  03/23/2020, 8:14 PM

## 2020-03-23 NOTE — ED Notes (Signed)
Pt to MRI

## 2020-03-23 NOTE — ED Notes (Signed)
Patient transported to CT 

## 2020-03-24 ENCOUNTER — Observation Stay (HOSPITAL_COMMUNITY): Payer: Medicare Other

## 2020-03-24 DIAGNOSIS — G459 Transient cerebral ischemic attack, unspecified: Principal | ICD-10-CM

## 2020-03-24 DIAGNOSIS — I639 Cerebral infarction, unspecified: Secondary | ICD-10-CM | POA: Diagnosis not present

## 2020-03-24 DIAGNOSIS — I6389 Other cerebral infarction: Secondary | ICD-10-CM

## 2020-03-24 DIAGNOSIS — Z20822 Contact with and (suspected) exposure to covid-19: Secondary | ICD-10-CM | POA: Diagnosis present

## 2020-03-24 DIAGNOSIS — Z79899 Other long term (current) drug therapy: Secondary | ICD-10-CM | POA: Diagnosis not present

## 2020-03-24 DIAGNOSIS — I10 Essential (primary) hypertension: Secondary | ICD-10-CM | POA: Diagnosis present

## 2020-03-24 DIAGNOSIS — E038 Other specified hypothyroidism: Secondary | ICD-10-CM | POA: Diagnosis not present

## 2020-03-24 DIAGNOSIS — E7800 Pure hypercholesterolemia, unspecified: Secondary | ICD-10-CM

## 2020-03-24 DIAGNOSIS — E669 Obesity, unspecified: Secondary | ICD-10-CM | POA: Diagnosis present

## 2020-03-24 DIAGNOSIS — Z7982 Long term (current) use of aspirin: Secondary | ICD-10-CM | POA: Diagnosis not present

## 2020-03-24 DIAGNOSIS — E785 Hyperlipidemia, unspecified: Secondary | ICD-10-CM | POA: Diagnosis present

## 2020-03-24 DIAGNOSIS — Z801 Family history of malignant neoplasm of trachea, bronchus and lung: Secondary | ICD-10-CM | POA: Diagnosis not present

## 2020-03-24 DIAGNOSIS — R29700 NIHSS score 0: Secondary | ICD-10-CM | POA: Diagnosis present

## 2020-03-24 DIAGNOSIS — Z96643 Presence of artificial hip joint, bilateral: Secondary | ICD-10-CM | POA: Diagnosis present

## 2020-03-24 DIAGNOSIS — Z79890 Hormone replacement therapy (postmenopausal): Secondary | ICD-10-CM | POA: Diagnosis not present

## 2020-03-24 DIAGNOSIS — Z803 Family history of malignant neoplasm of breast: Secondary | ICD-10-CM | POA: Diagnosis not present

## 2020-03-24 DIAGNOSIS — Z6836 Body mass index (BMI) 36.0-36.9, adult: Secondary | ICD-10-CM | POA: Diagnosis not present

## 2020-03-24 DIAGNOSIS — E039 Hypothyroidism, unspecified: Secondary | ICD-10-CM | POA: Diagnosis present

## 2020-03-24 DIAGNOSIS — I6523 Occlusion and stenosis of bilateral carotid arteries: Secondary | ICD-10-CM | POA: Diagnosis present

## 2020-03-24 DIAGNOSIS — Z8249 Family history of ischemic heart disease and other diseases of the circulatory system: Secondary | ICD-10-CM | POA: Diagnosis not present

## 2020-03-24 LAB — MAGNESIUM: Magnesium: 1.9 mg/dL (ref 1.7–2.4)

## 2020-03-24 LAB — CBC
HCT: 38 % — ABNORMAL LOW (ref 39.0–52.0)
Hemoglobin: 12.3 g/dL — ABNORMAL LOW (ref 13.0–17.0)
MCH: 30.3 pg (ref 26.0–34.0)
MCHC: 32.4 g/dL (ref 30.0–36.0)
MCV: 93.6 fL (ref 80.0–100.0)
Platelets: 185 10*3/uL (ref 150–400)
RBC: 4.06 MIL/uL — ABNORMAL LOW (ref 4.22–5.81)
RDW: 13.1 % (ref 11.5–15.5)
WBC: 6 10*3/uL (ref 4.0–10.5)
nRBC: 0 % (ref 0.0–0.2)

## 2020-03-24 LAB — BASIC METABOLIC PANEL
Anion gap: 9 (ref 5–15)
BUN: 18 mg/dL (ref 8–23)
CO2: 25 mmol/L (ref 22–32)
Calcium: 9.1 mg/dL (ref 8.9–10.3)
Chloride: 108 mmol/L (ref 98–111)
Creatinine, Ser: 0.87 mg/dL (ref 0.61–1.24)
GFR calc Af Amer: >60 mL/min (ref >60)
GFR calc non Af Amer: >60 mL/min (ref >60)
Glucose, Bld: 132 mg/dL — ABNORMAL HIGH (ref 70–99)
Potassium: 3.6 mmol/L (ref 3.5–5.1)
Sodium: 142 mmol/L (ref 135–145)

## 2020-03-24 LAB — HEMOGLOBIN A1C
Hgb A1c MFr Bld: 5.8 % — ABNORMAL HIGH (ref 4.8–5.6)
Mean Plasma Glucose: 119.76 mg/dL

## 2020-03-24 LAB — LIPID PANEL
Cholesterol: 139 mg/dL (ref 0–200)
HDL: 34 mg/dL — ABNORMAL LOW (ref >40)
LDL Cholesterol: 50 mg/dL (ref 0–99)
Total CHOL/HDL Ratio: 4.1 RATIO
Triglycerides: 277 mg/dL — ABNORMAL HIGH (ref <150)
VLDL: 55 mg/dL — ABNORMAL HIGH (ref 0–40)

## 2020-03-24 LAB — URIC ACID: Uric Acid, Serum: 7.5 mg/dL (ref 3.7–8.6)

## 2020-03-24 LAB — LACTATE DEHYDROGENASE: LDH: 229 U/L — ABNORMAL HIGH (ref 98–192)

## 2020-03-24 LAB — PHOSPHORUS: Phosphorus: 3.8 mg/dL (ref 2.5–4.6)

## 2020-03-24 LAB — TSH: TSH: 3.342 u[IU]/mL (ref 0.350–4.500)

## 2020-03-24 LAB — GLUCOSE, CAPILLARY: Glucose-Capillary: 137 mg/dL — ABNORMAL HIGH (ref 70–99)

## 2020-03-24 MED ORDER — STUDY - AXIOMATIC STUDY - CLOPIDOGREL 75MG (PI-SETHI)
75.0000 mg | ORAL_TABLET | Freq: Every day | ORAL | Status: DC
  Administered 2020-03-25: 75 mg via ORAL
  Filled 2020-03-24 (×2): qty 75

## 2020-03-24 MED ORDER — LORAZEPAM 2 MG/ML IJ SOLN
2.0000 mg | Freq: Once | INTRAMUSCULAR | Status: AC
  Administered 2020-03-24: 3 mg via INTRAVENOUS
  Filled 2020-03-24: qty 2

## 2020-03-24 MED ORDER — STUDY - AXIOMATIC STUDY - ASPIRIN 100 MG (PI-SETHI)
100.0000 mg | ORAL_TABLET | Freq: Every day | ORAL | Status: DC
  Administered 2020-03-24 – 2020-03-25 (×2): 100 mg via ORAL
  Filled 2020-03-24 (×4): qty 100

## 2020-03-24 MED ORDER — STUDY - AXIOMATIC STUDY - CLOPIDOGREL 75MG (PI-SETHI)
300.0000 mg | ORAL_TABLET | Freq: Once | ORAL | Status: AC
  Administered 2020-03-24: 300 mg via ORAL
  Filled 2020-03-24 (×2): qty 300

## 2020-03-24 MED ORDER — STUDY - AXIOMATIC STUDY - BMS986177 OR PLACEBO (PI-SETHI)
2.0000 | ORAL_CAPSULE | Freq: Two times a day (BID) | ORAL | Status: DC
  Administered 2020-03-24 – 2020-03-25 (×2): 2 via ORAL
  Filled 2020-03-24 (×5): qty 2

## 2020-03-24 NOTE — Progress Notes (Signed)
  Echocardiogram 2D Echocardiogram has been performed.  Colin [name] 03/24/2020, 11:35 AM

## 2020-03-24 NOTE — Progress Notes (Signed)
STROKE TEAM PROGRESS NOTE   INTERVAL HISTORY I have personally reviewed history of presenting illness with the patient, electronic medical records and imaging films in PACS.  If presented with transient episode of left upper extremity weakness and numbness yesterday lasting 5 minutes.  MRI scan shows no acute infarct.  CT angiogram shows no significant large vessel stenosis or occlusion.  LDL cholesterol 50 mg percent and hemoglobin A1c is 5.8.  Echocardiogram is pending.  Patient used to take aspirin prior to admission.  He states is done well since admission and had no recurrent stroke or TIA symptoms.  Vitals:   03/24/20 0614 03/24/20 0719 03/24/20 0800 03/24/20 0857  BP: (!) 147/97 (!) 142/103 (!) 146/78 (!) 143/86  Pulse: 70 69 64 62  Resp: 14 18 11 18   Temp:    98.7 F (37.1 C)  TempSrc:    Oral  SpO2: 99% 99% 97% 98%   CBC:  Recent Labs  Lab 03/23/20 1617 03/23/20 1617 03/23/20 1652 03/24/20 0425  WBC 7.0  --   --  6.0  NEUTROABS 4.5  --   --   --   HGB 13.4   < > 13.3 12.3*  HCT 42.3   < > 39.0 38.0*  MCV 96.4  --   --  93.6  PLT 194  --   --  185   < > = values in this interval not displayed.   Basic Metabolic Panel:  Recent Labs  Lab 03/23/20 1617 03/23/20 1617 03/23/20 1652 03/24/20 0425  NA 142   < > 141 142  K 3.7   < > 3.5 3.6  CL 107   < > 106 108  CO2 22  --   --  25  GLUCOSE 123*   < > 123* 132*  BUN 19   < > 21 18  CREATININE 1.11   < > 1.10 0.87  CALCIUM 9.4  --   --  9.1  MG  --   --   --  1.9   < > = values in this interval not displayed.   Lipid Panel:  Recent Labs  Lab 03/24/20 0425  CHOL 139  TRIG 277*  HDL 34*  CHOLHDL 4.1  VLDL 55*  LDLCALC 50   HgbA1c:  Recent Labs  Lab 03/24/20 0425  HGBA1C 5.8*   Urine Drug Screen: No results for input(s): LABOPIA, COCAINSCRNUR, LABBENZ, AMPHETMU, THCU, LABBARB in the last 168 hours.  Alcohol Level No results for input(s): ETH in the last 168 hours.  IMAGING past 24 hours CT Angio Head  W or Wo Contrast  Result Date: 03/23/2020 CLINICAL DATA:  New onset left upper extremity paralysis EXAM: CT ANGIOGRAPHY HEAD AND NECK TECHNIQUE: Multidetector CT imaging of the head and neck was performed using the standard protocol during bolus administration of intravenous contrast. Multiplanar CT image reconstructions and MIPs were obtained to evaluate the vascular anatomy. Carotid stenosis measurements (when applicable) are obtained utilizing NASCET criteria, using the distal internal carotid diameter as the denominator. CONTRAST:  60mL OMNIPAQUE IOHEXOL 350 MG/ML SOLN COMPARISON:  None. FINDINGS: CTA NECK FINDINGS SKELETON: There is no bony spinal canal stenosis. No lytic or blastic lesion. OTHER NECK: Normal pharynx, larynx and major salivary glands. No cervical lymphadenopathy. Unremarkable thyroid gland. UPPER CHEST: No pneumothorax or pleural effusion. No nodules or masses. AORTIC ARCH: There is mild calcific atherosclerosis of the aortic arch. There is no aneurysm, dissection or hemodynamically significant stenosis of the visualized portion of the aorta.  Conventional 3 vessel aortic branching pattern. The visualized proximal subclavian arteries are widely patent. RIGHT CAROTID SYSTEM: No dissection, occlusion or aneurysm. There is low density atherosclerosis extending into the proximal ICA, resulting in less than 50% stenosis. LEFT CAROTID SYSTEM: Normal without aneurysm, dissection or stenosis. VERTEBRAL ARTERIES: Left dominant configuration. Both origins are clearly patent. There is no dissection, occlusion or flow-limiting stenosis to the skull base (V1-V3 segments). CTA HEAD FINDINGS POSTERIOR CIRCULATION: --Vertebral arteries: There is moderate atherosclerotic calcification both V4 segments, right greater left. Mild-to-moderate stenosis bilaterally. --Inferior cerebellar arteries: Normal. --Basilar artery: Normal. --Superior cerebellar arteries: Normal. --Posterior cerebral arteries (PCA): Normal.  ANTERIOR CIRCULATION: --Intracranial internal carotid arteries: Normal. --Anterior cerebral arteries (ACA): Normal. Both A1 segments are present. Patent anterior communicating artery (a-comm). --Middle cerebral arteries (MCA): Normal. VENOUS SINUSES: As permitted by contrast timing, patent. ANATOMIC VARIANTS: None Review of the MIP images confirms the above findings. IMPRESSION: 1. No emergent large vessel occlusion or high-grade stenosis of the intracranial arteries. 2. Bilateral proximal internal carotid artery atherosclerosis without hemodynamically significant stenosis by NASCET criteria. 3. Moderate bilateral vertebral artery V4 segment atherosclerotic calcification with mild-to-moderate stenosis bilaterally. 4. Aortic Atherosclerosis (ICD10-I70.0). Electronically Signed   By: Ulyses Jarred M.D.   On: 03/23/2020 23:11   CT HEAD WO CONTRAST  Result Date: 03/23/2020 CLINICAL DATA:  Neuro deficit, acute, stroke suspected Possible stroke Patient reports left arm weakness and numbness, onset today. Symptoms lasted 3 minutes and now resolved. EXAM: CT HEAD WITHOUT CONTRAST TECHNIQUE: Contiguous axial images were obtained from the base of the skull through the vertex without intravenous contrast. COMPARISON:  None. FINDINGS: Brain: Age related atrophy. No intracranial hemorrhage, mass effect, or midline shift. No hydrocephalus. The basilar cisterns are patent. No evidence of territorial infarct or acute ischemia. No extra-axial or intracranial fluid collection. Vascular: Atherosclerosis of skullbase vasculature without hyperdense vessel or abnormal calcification. Skull: No fracture or focal lesion. Sinuses/Orbits: Paranasal sinuses and mastoid air cells are clear. The visualized orbits are unremarkable. Other: None. IMPRESSION: Unremarkable head CT for age. Electronically Signed   By: Keith Rake M.D.   On: 03/23/2020 17:44   CT Angio Neck W and/or Wo Contrast  Result Date: 03/23/2020 CLINICAL DATA:  New  onset left upper extremity paralysis EXAM: CT ANGIOGRAPHY HEAD AND NECK TECHNIQUE: Multidetector CT imaging of the head and neck was performed using the standard protocol during bolus administration of intravenous contrast. Multiplanar CT image reconstructions and MIPs were obtained to evaluate the vascular anatomy. Carotid stenosis measurements (when applicable) are obtained utilizing NASCET criteria, using the distal internal carotid diameter as the denominator. CONTRAST:  43mL OMNIPAQUE IOHEXOL 350 MG/ML SOLN COMPARISON:  None. FINDINGS: CTA NECK FINDINGS SKELETON: There is no bony spinal canal stenosis. No lytic or blastic lesion. OTHER NECK: Normal pharynx, larynx and major salivary glands. No cervical lymphadenopathy. Unremarkable thyroid gland. UPPER CHEST: No pneumothorax or pleural effusion. No nodules or masses. AORTIC ARCH: There is mild calcific atherosclerosis of the aortic arch. There is no aneurysm, dissection or hemodynamically significant stenosis of the visualized portion of the aorta. Conventional 3 vessel aortic branching pattern. The visualized proximal subclavian arteries are widely patent. RIGHT CAROTID SYSTEM: No dissection, occlusion or aneurysm. There is low density atherosclerosis extending into the proximal ICA, resulting in less than 50% stenosis. LEFT CAROTID SYSTEM: Normal without aneurysm, dissection or stenosis. VERTEBRAL ARTERIES: Left dominant configuration. Both origins are clearly patent. There is no dissection, occlusion or flow-limiting stenosis to the skull base (V1-V3 segments). CTA HEAD  FINDINGS POSTERIOR CIRCULATION: --Vertebral arteries: There is moderate atherosclerotic calcification both V4 segments, right greater left. Mild-to-moderate stenosis bilaterally. --Inferior cerebellar arteries: Normal. --Basilar artery: Normal. --Superior cerebellar arteries: Normal. --Posterior cerebral arteries (PCA): Normal. ANTERIOR CIRCULATION: --Intracranial internal carotid arteries:  Normal. --Anterior cerebral arteries (ACA): Normal. Both A1 segments are present. Patent anterior communicating artery (a-comm). --Middle cerebral arteries (MCA): Normal. VENOUS SINUSES: As permitted by contrast timing, patent. ANATOMIC VARIANTS: None Review of the MIP images confirms the above findings. IMPRESSION: 1. No emergent large vessel occlusion or high-grade stenosis of the intracranial arteries. 2. Bilateral proximal internal carotid artery atherosclerosis without hemodynamically significant stenosis by NASCET criteria. 3. Moderate bilateral vertebral artery V4 segment atherosclerotic calcification with mild-to-moderate stenosis bilaterally. 4. Aortic Atherosclerosis (ICD10-I70.0). Electronically Signed   By: Ulyses Jarred M.D.   On: 03/23/2020 23:11   MR BRAIN WO CONTRAST  Result Date: 03/23/2020 CLINICAL DATA:  Transient ischemic attack.  Negative head CT. EXAM: MRI HEAD WITHOUT CONTRAST TECHNIQUE: Multiplanar, multiecho pulse sequences of the brain and surrounding structures were obtained without intravenous contrast. COMPARISON:  Head CT same day FINDINGS: Brain: Diffusion imaging does not show any acute or subacute infarction. No focal abnormality affects the brainstem or cerebellum. Cerebral hemispheres are normal except for a single white matter focus in the deep white matter of the right frontoparietal junction consistent with an old small vessel infarction. No mass lesion, hemorrhage, hydrocephalus or extra-axial collection. Vascular: Major vessels at the base of the brain show flow. Skull and upper cervical spine: Negative Sinuses/Orbits: Clear/normal Other: None IMPRESSION: No acute finding. 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 Electronically Signed   By: Nelson Chimes M.D.   On: 03/23/2020 21:22   ECHOCARDIOGRAM COMPLETE BUBBLE STUDY  Result Date: 03/24/2020    ECHOCARDIOGRAM REPORT   Patient Name:   DELDRICK LINCH [name]  Date of Exam: 03/24/2020 Medical Rec #:  [id number]      Height:       73.0 in Accession #:    [id number]     Weight:       271.4 lb Date of Birth:  Oct 25, 1950       BSA:          2.450 m Patient Age:    4 years       BP:           143/86 mmHg Patient Gender: M              HR:           62 bpm. Exam Location:  Inpatient Procedure: 2D Echo Indications:    TIA  History:        Patient has prior history of Echocardiogram examinations, most                 recent 10/27/2019. Risk Factors:Hypertension and Dyslipidemia.  Sonographer:    Jannett Celestine RDCS (AE) Referring Phys: [id number] [place]  1. Left ventricular ejection fraction, by estimation, is 60 to 65%. The left ventricle has normal function. The left ventricle has no regional wall motion abnormalities. Left ventricular diastolic parameters were normal.  2. Right ventricular systolic function is normal. The right ventricular size is normal.  3. Negative bubble study for right to left shunt.  4. The mitral valve is normal in structure. No evidence of mitral valve regurgitation. No evidence of mitral stenosis.  5. Calcified non coronary cusp. The aortic valve was  not well visualized. Aortic valve regurgitation is not visualized. Mild to moderate aortic valve sclerosis/calcification is present, without any evidence of aortic stenosis.  6. The inferior vena cava is normal in size with greater than 50% respiratory variability, suggesting right atrial pressure of 3 mmHg. FINDINGS  Left Ventricle: Left ventricular ejection fraction, by estimation, is 60 to 65%. The left ventricle has normal function. The left ventricle has no regional wall motion abnormalities. The left ventricular internal cavity size was normal in size. There is  no left ventricular hypertrophy. Left ventricular diastolic parameters were normal. Right Ventricle: The right ventricular size is normal. No increase in right ventricular wall thickness. Right ventricular systolic function is  normal. Left Atrium: Left atrial size was normal in size. Right Atrium: Right atrial size was normal in size. Pericardium: There is no evidence of pericardial effusion. Mitral Valve: The mitral valve is normal in structure. Normal mobility of the mitral valve leaflets. No evidence of mitral valve regurgitation. No evidence of mitral valve stenosis. Tricuspid Valve: The tricuspid valve is normal in structure. Tricuspid valve regurgitation is not demonstrated. No evidence of tricuspid stenosis. Aortic Valve: Calcified non coronary cusp. The aortic valve was not well visualized. Aortic valve regurgitation is not visualized. Mild to moderate aortic valve sclerosis/calcification is present, without any evidence of aortic stenosis. Pulmonic Valve: The pulmonic valve was normal in structure. Pulmonic valve regurgitation is not visualized. No evidence of pulmonic stenosis. Aorta: The aortic root is normal in size and structure. Venous: The inferior vena cava is normal in size with greater than 50% respiratory variability, suggesting right atrial pressure of 3 mmHg. IAS/Shunts: No atrial level shunt detected by color flow Doppler.  LEFT VENTRICLE PLAX 2D LVIDd:         5.69 cm  Diastology LVIDs:         3.14 cm  LV e' lateral:   10.70 cm/s LV PW:         0.87 cm  LV E/e' lateral: 5.9 LV IVS:        0.83 cm  LV e' medial:    7.29 cm/s LVOT diam:     2.20 cm  LV E/e' medial:  8.7 LV SV:         58 LV SV Index:   24 LVOT Area:     3.80 cm  RIGHT VENTRICLE RV S prime:     12.70 cm/s LEFT ATRIUM             Index LA diam:        3.70 cm 1.51 cm/m LA Vol (A2C):   32.6 ml 13.31 ml/m LA Vol (A4C):   60.7 ml 24.78 ml/m LA Biplane Vol: 45.5 ml 18.57 ml/m  AORTIC VALVE LVOT Vmax:   64.60 cm/s LVOT Vmean:  46.100 cm/s LVOT VTI:    0.152 m  AORTA Ao Root diam: 2.60 cm MITRAL VALVE MV Area (PHT): 2.11 cm    SHUNTS MV Decel Time: 359 msec    Systemic VTI:  0.15 m MV E velocity: 63.40 cm/s  Systemic Diam: 2.20 cm MV A velocity: 30.40  cm/s MV E/A ratio:  2.09 Jenkins Rouge MD Electronically signed by Jenkins Rouge MD Signature Date/Time: 03/24/2020/11:55:05 AM    Final     PHYSICAL EXAM Pleasant mildly obese middle-aged Caucasian male not in distress.  Right shoulder movements are limited due to rotator cuff surgery and pain . Afebrile. Head is nontraumatic. Neck is supple without bruit.    Cardiac exam  no murmur or gallop. Lungs are clear to auscultation. Distal pulses are well felt. Neurological Exam ;  Awake  Alert oriented x 3. Normal speech and language.eye movements full without nystagmus.fundi were not visualized. Vision acuity and fields appear normal. Hearing is normal. Palatal movements are normal. Face symmetric. Tongue midline. Normal strength, tone, reflexes and coordination.  Right shoulder movements limited due to pain normal sensation. Gait deferred. NIH stroke scale 0.  Premorbid modified Rankin scale 1 due to shoulder pain ASSESSMENT/PLAN Colin [name] is a 69 y.o. male with history of HTN, HLD, ovesity presenting with LUE weakness and numbness.   R brain  Subcortical TIA  CT head Unremarkable   MRI  No acute abnormality. 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  CTA head & neck no LVO. B proximal ICA atherosclerosis. 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 Aortic atherosclerosis.   2D Echo w/ bubble EF 60-65%. No source of embolus   LDL 50  HgbA1c 5.8  VTE prophylaxis - Lovenox 40 mg sq daily   aspirin 81 mg daily prior to admission, now on aspirin 81 mg daily and clopidogrel 75 mg daily. Continue DAPT x 3 weeks then plavix alone - pt is considering BMS trial. [facility] will follow up with him.  Therapy recommendations:  No OT  Disposition:  Anticipate return home  Hypertension  Stable . BP goal normotensive  Hyperlipidemia  Home meds:  crestor 20 + fish oil, Crestor resumed in hospital  LDL 50, goal < 70  Continue statin +  fish oil at discharge  Other Stroke Risk Factors  Advanced age  Obesity, There is no height or weight on file to calculate BMI., recommend weight loss, diet and exercise as appropriate   Other Active Problems  Hypothyroidism   Hospital day # 0  He presented with transient left upper extremity weakness and numbness likely due to right brain subcortical TIA from small vessel disease.  Recommend aspirin Plavix for 3 weeks followed by aspirin alone and aggressive risk factor modification.  Check echocardiogram results.  Patient may benefit with possible participation in BMS Axiomatic stroke study.  He was given information to review and decide. D/w Dr Velia Meyer.  Greater than 50% time during this 35-minute visit was spent on counseling and coordination of care about his TIA and discussion about risk for recurrent stroke and stroke prevention strategies and answering questions Antony Contras, MD To contact Stroke Continuity provider, please refer to [email or web address] After hours, contact General Neurology

## 2020-03-24 NOTE — ED Notes (Signed)
Attempted report x1. 

## 2020-03-24 NOTE — Progress Notes (Signed)
PROGRESS NOTE    Colin [name]  GGY:[id number] DOB: 1950-10-12 DOA: 03/23/2020 PCP: Antony Contras, MD     Brief Narrative:  Colin [name] is a 69 y.o. WM PMHx HTN, HLD, who is admitted to [facility] on 03/23/2020 with suspected TIA after presenting from home to [facility] Emergency Department complaining of left arm weakness.   The patient reports sudden onset of left upper weakness around 1430 this afternoon (03/23/20), with concominant numbness, which he believes was limited to the left hand. Reports that these symptoms lasted approximately 3-4 minutes before spontaneous resolution and no ensuing return of these symptoms. He reports chronic right upper extremity weakness in the setting of rotator cuff pathology, and denies any acute worsening of this RUE weakness. Reports chronic bilateral lower extremity paresthesias in the b/l feet, without any acute worsening thereof. Otherwise, the patient denies any acute focal weakness, paresthesias, numbness, dysphagia, dizziness, vertigo, nausea, vomiting, change in vision, blurry vision, diplopia, word finding difficulties, or headache. Denies facial droop or slurring of speech. Denies any associated chest pain, shortness of breath, palpitations, diaphoresis, presyncope, or syncope.  Denies any previous history of stroke or TIA. In terms of modifiable risk factors, the patient acknowledges a history of hypertension and hyperlipidemia, but denies any known h/o diabetes, atrial fibrillation, or OSA. Reports that he is lifelong non-smoker.  Reports good compliance with her outpatient anti-hypertensive medications as well as his home statin. He reports that he is on a daily baby aspirin, with most recent dose occurring on morning of 03/23/20. Otherwise, on no additional antiplatelet or anticoagulation medications at this time.   Denies any subjective fever, chills, rigors, or generalized myalgias. Denies any recent, neck stiffness, rhinitis,  rhinorrhea, sore throat, sob, cough, abdominal pain, diarrhea, or rash. Denies dysuria, gross hematuria, or change in urinary urgency/frequency.  Denies any recent chest pain, diaphoresis, or palpitations.   Subjective: A/O x4, negative CP, negative abdominal pain, negative headache.  Negative tremor   Assessment & Plan: Covid vaccination; positive vaccination   Principal Problem:   TIA (transient ischemic attack) Active Problems:   Hypertension   Hypothyroidism   Hyperlipidemia   #) TIA: diagnosis suspected on basis of transient, self-limited acute LUE weakness and left hand numbness around 1430 on 03/23/20, which has subsequently resolved without recurrence, and in the absence of any additional evidence of acute focal neurologic deficit, while presenting CT head showed no evidence of acute intracranial process, including no evidence of acute intracranial hemorrhage. Case discussed with on-call neurologist, Dr. Malen [name], who suspects TIA based upon the above presentation, and confirms that patient is not a candidate for TPA given interval complete resolution of presenting suspected acute neurologic deficit. Dr. Malen [name] recommends overnight admission to the hospitalist service for further work-up and management of suspected TIA, including checking MRI of the brain, CTA of head/neck, as well as for evaluation of potential modifiable CVA risk factors. As such, recommendations by the telestroke neurologist also include checking A1c, lipid panel, echocardiogram with bubble study, and overnight telemetry monitoring. He also recommends dual anti-platelet therapy with asa and plavix, with first doses of such administered in the ED this evening. This is relative to patient's baseline anti-platelet therapy of a daly baby aspirin, as above.  -Agreed to participate in BMS trial.  New type of anticoagulant trial being run by stroke team.  The patient possesses multiple modifiable CVA risk factors including a  history of  hypertension, hyperlipidemia. No known h/o tobacco abuse, OSA, or paroxsymal  atrial fibrillation.  EKG in ED today showed NSR, as above.   Essential HTN -All home BP medication on hold allow permissive HTN.  #) Hyperlipidemia: on rosuvastatin as outpatient.   #) Acquired hypothyroidism: on synthroid as outpatient.  -Synthroid 75 mcg daily      DVT prophylaxis: SCD Code Status: Full Family Communication: 7/7 wife at bedside for discussion of plan of care Status is: Inpatient    Dispo: The patient is from: Home              Anticipated d/c is to: Home              Anticipated d/c date is: Per stroke team; participating in study              Patient currently stable      Consultants:  Stroke team  Procedures/Significant Events:    I have personally reviewed and interpreted all radiology studies and my findings are as above.  VENTILATOR SETTINGS:    Cultures 7/7 SARS coronavirus negative   Antimicrobials:    Devices    LINES / TUBES:      Continuous Infusions:   Objective: Vitals:   03/24/20 0428 03/24/20 0614 03/24/20 0719 03/24/20 0800  BP: (!) 115/92 (!) 147/97 (!) 142/103 (!) 146/78  Pulse: (!) 59 70 69 64  Resp: 17 14 18 11   Temp:      TempSrc:      SpO2: 99% 99% 99% 97%   No intake or output data in the 24 hours ending 03/24/20 0820 There were no vitals filed for this visit.  Examination:  General: A/O x4, No acute respiratory distress Eyes: negative scleral hemorrhage, negative anisocoria, negative icterus ENT: Negative Runny nose, negative gingival bleeding, Neck:  Negative scars, masses, torticollis, lymphadenopathy, JVD Lungs: Clear to auscultation bilaterally without wheezes or crackles Cardiovascular: Regular rate and rhythm without murmur gallop or rub normal S1 and S2 Abdomen: negative abdominal pain, nondistended, positive soft, bowel sounds, no rebound, no ascites, no appreciable mass Extremities: No  significant cyanosis, clubbing, or edema bilateral lower extremities Skin: Negative rashes, lesions, ulcers Psychiatric:  Negative depression, negative anxiety, negative fatigue, negative mania  Central nervous system:  Cranial nerves II through XII intact, tongue/uvula midline, all extremities muscle strength 5/5, sensation intact throughout,  negative dysarthria, negative expressive aphasia, negative receptive aphasia.  .     Data Reviewed: Care during the described time interval was provided by me .  I have reviewed this patient's available data, including medical history, events of note, physical examination, and all test results as part of my evaluation.  CBC: Recent Labs  Lab 03/23/20 1617 03/23/20 1652 03/24/20 0425  WBC 7.0  --  6.0  NEUTROABS 4.5  --   --   HGB 13.4 13.3 12.3*  HCT 42.3 39.0 38.0*  MCV 96.4  --  93.6  PLT 194  --  902   Basic Metabolic Panel: Recent Labs  Lab 03/23/20 1617 03/23/20 1652 03/24/20 0425  NA 142 141 142  K 3.7 3.5 3.6  CL 107 106 108  CO2 22  --  25  GLUCOSE 123* 123* 132*  BUN 19 21 18   CREATININE 1.11 1.10 0.87  CALCIUM 9.4  --  9.1  MG  --   --  1.9   GFR: Estimated Creatinine Clearance: 113.3 mL/min (by C-G formula based on SCr of 0.87 mg/dL). Liver Function Tests: Recent Labs  Lab 03/23/20 1617  AST 45*  ALT 33  ALKPHOS 44  BILITOT 0.7  PROT 6.9  ALBUMIN 3.8   No results for input(s): LIPASE, AMYLASE in the last 168 hours. No results for input(s): AMMONIA in the last 168 hours. Coagulation Profile: Recent Labs  Lab 03/23/20 1617  INR 1.1   Cardiac Enzymes: No results for input(s): CKTOTAL, CKMB, CKMBINDEX, TROPONINI in the last 168 hours. BNP (last 3 results) No results for input(s): PROBNP in the last 8760 hours. HbA1C: Recent Labs    03/24/20 0425  HGBA1C 5.8*   CBG: Recent Labs  Lab 03/23/20 2324  GLUCAP 83   Lipid Profile: Recent Labs    03/24/20 0425  CHOL 139  HDL 34*  LDLCALC 50   TRIG 277*  CHOLHDL 4.1   Thyroid Function Tests: Recent Labs    03/24/20 0425  TSH 3.342   Anemia Panel: No results for input(s): VITAMINB12, FOLATE, FERRITIN, TIBC, IRON, RETICCTPCT in the last 72 hours. Sepsis Labs: No results for input(s): PROCALCITON, LATICACIDVEN in the last 168 hours.  Recent Results (from the past 240 hour(s))  SARS Coronavirus 2 by RT PCR (hospital order, performed in [facility] hospital lab) Nasopharyngeal Nasopharyngeal Swab     Status: None   Collection Time: 03/23/20  9:40 PM   Specimen: Nasopharyngeal Swab  Result Value Ref Range Status   SARS Coronavirus 2 NEGATIVE NEGATIVE Final    Comment: (NOTE) SARS-CoV-2 target nucleic acids are NOT DETECTED.  The SARS-CoV-2 RNA is generally detectable in upper and lower respiratory specimens during the acute phase of infection. The lowest concentration of SARS-CoV-2 viral copies this assay can detect is 250 copies / mL. A negative result does not preclude SARS-CoV-2 infection and should not be used as the sole basis for treatment or other patient management decisions.  A negative result may occur with improper specimen collection / handling, submission of specimen other than nasopharyngeal swab, presence of viral mutation(s) within the areas targeted by this assay, and inadequate number of viral copies (<250 copies / mL). A negative result must be combined with clinical observations, patient history, and epidemiological information.  Fact Sheet for Patients:   StrictlyIdeas.no  Fact Sheet for Healthcare Providers: BankingDealers.co.za  This test is not yet approved or  cleared by the [place] FDA and has been authorized for detection and/or diagnosis of SARS-CoV-2 by FDA under an Emergency Use Authorization (EUA).  This EUA will remain in effect (meaning this test can be used) for the duration of the COVID-19 declaration under Section 564(b)(1) of  the Act, 21 U.S.C. section 360bbb-3(b)(1), unless the authorization is terminated or revoked sooner.  Performed at [facility], [address], [place], [place] [address]          Radiology Studies: CT Angio Head W or Wo Contrast  Result Date: 03/23/2020 CLINICAL DATA:  New onset left upper extremity paralysis EXAM: CT ANGIOGRAPHY HEAD AND NECK TECHNIQUE: Multidetector CT imaging of the head and neck was performed using the standard protocol during bolus administration of intravenous contrast. Multiplanar CT image reconstructions and MIPs were obtained to evaluate the vascular anatomy. Carotid stenosis measurements (when applicable) are obtained utilizing NASCET criteria, using the distal internal carotid diameter as the denominator. CONTRAST:  60mL OMNIPAQUE IOHEXOL 350 MG/ML SOLN COMPARISON:  None. FINDINGS: CTA NECK FINDINGS SKELETON: There is no bony spinal canal stenosis. No lytic or blastic lesion. OTHER NECK: Normal pharynx, larynx and major salivary glands. No cervical lymphadenopathy. Unremarkable thyroid gland. UPPER CHEST: No pneumothorax or pleural effusion. No nodules  or masses. AORTIC ARCH: There is mild calcific atherosclerosis of the aortic arch. There is no aneurysm, dissection or hemodynamically significant stenosis of the visualized portion of the aorta. Conventional 3 vessel aortic branching pattern. The visualized proximal subclavian arteries are widely patent. RIGHT CAROTID SYSTEM: No dissection, occlusion or aneurysm. There is low density atherosclerosis extending into the proximal ICA, resulting in less than 50% stenosis. LEFT CAROTID SYSTEM: Normal without aneurysm, dissection or stenosis. VERTEBRAL ARTERIES: Left dominant configuration. Both origins are clearly patent. There is no dissection, occlusion or flow-limiting stenosis to the skull base (V1-V3 segments). CTA HEAD FINDINGS POSTERIOR CIRCULATION: --Vertebral arteries: There is moderate atherosclerotic  calcification both V4 segments, right greater left. Mild-to-moderate stenosis bilaterally. --Inferior cerebellar arteries: Normal. --Basilar artery: Normal. --Superior cerebellar arteries: Normal. --Posterior cerebral arteries (PCA): Normal. ANTERIOR CIRCULATION: --Intracranial internal carotid arteries: Normal. --Anterior cerebral arteries (ACA): Normal. Both A1 segments are present. Patent anterior communicating artery (a-comm). --Middle cerebral arteries (MCA): Normal. VENOUS SINUSES: As permitted by contrast timing, patent. ANATOMIC VARIANTS: None Review of the MIP images confirms the above findings. IMPRESSION: 1. No emergent large vessel occlusion or high-grade stenosis of the intracranial arteries. 2. Bilateral proximal internal carotid artery atherosclerosis without hemodynamically significant stenosis by NASCET criteria. 3. Moderate bilateral vertebral artery V4 segment atherosclerotic calcification with mild-to-moderate stenosis bilaterally. 4. Aortic Atherosclerosis (ICD10-I70.0). Electronically Signed   By: Ulyses Jarred M.D.   On: 03/23/2020 23:11   CT HEAD WO CONTRAST  Result Date: 03/23/2020 CLINICAL DATA:  Neuro deficit, acute, stroke suspected Possible stroke Patient reports left arm weakness and numbness, onset today. Symptoms lasted 3 minutes and now resolved. EXAM: CT HEAD WITHOUT CONTRAST TECHNIQUE: Contiguous axial images were obtained from the base of the skull through the vertex without intravenous contrast. COMPARISON:  None. FINDINGS: Brain: Age related atrophy. No intracranial hemorrhage, mass effect, or midline shift. No hydrocephalus. The basilar cisterns are patent. No evidence of territorial infarct or acute ischemia. No extra-axial or intracranial fluid collection. Vascular: Atherosclerosis of skullbase vasculature without hyperdense vessel or abnormal calcification. Skull: No fracture or focal lesion. Sinuses/Orbits: Paranasal sinuses and mastoid air cells are clear. The  visualized orbits are unremarkable. Other: None. IMPRESSION: Unremarkable head CT for age. Electronically Signed   By: Keith Rake M.D.   On: 03/23/2020 17:44   CT Angio Neck W and/or Wo Contrast  Result Date: 03/23/2020 CLINICAL DATA:  New onset left upper extremity paralysis EXAM: CT ANGIOGRAPHY HEAD AND NECK TECHNIQUE: Multidetector CT imaging of the head and neck was performed using the standard protocol during bolus administration of intravenous contrast. Multiplanar CT image reconstructions and MIPs were obtained to evaluate the vascular anatomy. Carotid stenosis measurements (when applicable) are obtained utilizing NASCET criteria, using the distal internal carotid diameter as the denominator. CONTRAST:  60mL OMNIPAQUE IOHEXOL 350 MG/ML SOLN COMPARISON:  None. FINDINGS: CTA NECK FINDINGS SKELETON: There is no bony spinal canal stenosis. No lytic or blastic lesion. OTHER NECK: Normal pharynx, larynx and major salivary glands. No cervical lymphadenopathy. Unremarkable thyroid gland. UPPER CHEST: No pneumothorax or pleural effusion. No nodules or masses. AORTIC ARCH: There is mild calcific atherosclerosis of the aortic arch. There is no aneurysm, dissection or hemodynamically significant stenosis of the visualized portion of the aorta. Conventional 3 vessel aortic branching pattern. The visualized proximal subclavian arteries are widely patent. RIGHT CAROTID SYSTEM: No dissection, occlusion or aneurysm. There is low density atherosclerosis extending into the proximal ICA, resulting in less than 50% stenosis. LEFT CAROTID SYSTEM: Normal without aneurysm,  dissection or stenosis. VERTEBRAL ARTERIES: Left dominant configuration. Both origins are clearly patent. There is no dissection, occlusion or flow-limiting stenosis to the skull base (V1-V3 segments). CTA HEAD FINDINGS POSTERIOR CIRCULATION: --Vertebral arteries: There is moderate atherosclerotic calcification both V4 segments, right greater left.  Mild-to-moderate stenosis bilaterally. --Inferior cerebellar arteries: Normal. --Basilar artery: Normal. --Superior cerebellar arteries: Normal. --Posterior cerebral arteries (PCA): Normal. ANTERIOR CIRCULATION: --Intracranial internal carotid arteries: Normal. --Anterior cerebral arteries (ACA): Normal. Both A1 segments are present. Patent anterior communicating artery (a-comm). --Middle cerebral arteries (MCA): Normal. VENOUS SINUSES: As permitted by contrast timing, patent. ANATOMIC VARIANTS: None Review of the MIP images confirms the above findings. IMPRESSION: 1. No emergent large vessel occlusion or high-grade stenosis of the intracranial arteries. 2. Bilateral proximal internal carotid artery atherosclerosis without hemodynamically significant stenosis by NASCET criteria. 3. Moderate bilateral vertebral artery V4 segment atherosclerotic calcification with mild-to-moderate stenosis bilaterally. 4. Aortic Atherosclerosis (ICD10-I70.0). Electronically Signed   By: Ulyses Jarred M.D.   On: 03/23/2020 23:11   MR BRAIN WO CONTRAST  Result Date: 03/23/2020 CLINICAL DATA:  Transient ischemic attack.  Negative head CT. EXAM: MRI HEAD WITHOUT CONTRAST TECHNIQUE: Multiplanar, multiecho pulse sequences of the brain and surrounding structures were obtained without intravenous contrast. COMPARISON:  Head CT same day FINDINGS: Brain: Diffusion imaging does not show any acute or subacute infarction. No focal abnormality affects the brainstem or cerebellum. Cerebral hemispheres are normal except for a single white matter focus in the deep white matter of the right frontoparietal junction consistent with an old small vessel infarction. No mass lesion, hemorrhage, hydrocephalus or extra-axial collection. Vascular: Major vessels at the base of the brain show flow. Skull and upper cervical spine: Negative Sinuses/Orbits: Clear/normal Other: None IMPRESSION: No acute finding. 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 Electronically Signed   By: Nelson Chimes M.D.   On: 03/23/2020 21:22        Scheduled Meds: . aspirin EC  81 mg Oral Daily  . clopidogrel  75 mg Oral Daily  . enoxaparin (LOVENOX) injection  40 mg Subcutaneous Daily  . levothyroxine  75 mcg Oral Daily  . rosuvastatin  20 mg Oral Daily  . sodium chloride flush  3 mL Intravenous Once  . sodium chloride flush  3 mL Intravenous Q12H   Continuous Infusions:   LOS: 0 days    Time spent:40 min    WOODS, Geraldo Docker, MD [facility] Pager [phone]  If 7PM-7AM, please contact night-coverage [email or web address] Password [email or web address] 03/24/2020, 8:20 AM

## 2020-03-24 NOTE — ED Notes (Signed)
Breakfast ordered 

## 2020-03-24 NOTE — Consult Note (Signed)
Requesting Physician: Dr. Charlsie Quest    Chief Complaint: Left hand weakness  History obtained from: Patient and Chart   HPI:                                                                                                                                       Colin [name] is a 69 y.o. male with past medical history significant for hypertension, hyperlipidemia, obesity presents to emergency department on 7/6 with sudden onset left upper extremity weakness.  Patient states that around 2:30 PM patient had sudden onset numbness of his left arm followed by difficulty controlling it.  Symptoms lasted approximately 3 to 4 minutes completely resolved.  Denies any jerking movements, slurred speech, headache, facial droop.  In the emergency department patient underwent CT head which was unremarkable.  MRI brain was also performed which is negative for acute stroke.  Neurology was consulted for further recommendations.    Date last known well: 03/23/2020 Time last known well: 2:30 PM tPA Given: No, symptoms resolved NIHSS: 0 Baseline MRS 0   Past Medical History:  Diagnosis Date  . Arthritis   . Bowel trouble    Stool leakage since colonscopy 02/2016  . Cancer ([place])    skin cancer  . Carpal tunnel syndrome, bilateral   . Depression    in teenage years   . GERD (gastroesophageal reflux disease)   . Hyperlipemia   . Hypertension   . Hypothyroidism   . Interstitial lung disease ([place])   . Pneumonia   . Shortness of breath dyspnea    with bending over   . Wears glasses     Past Surgical History:  Procedure Laterality Date  . APPENDECTOMY    . CARPAL TUNNEL RELEASE  04/23/2012   Procedure: CARPAL TUNNEL RELEASE;  Surgeon: Cammie Sickle., MD;  Location: [facility];  Service: Orthopedics;  Laterality: Left;  . CARPAL TUNNEL RELEASE  06/27/2012   Procedure: CARPAL TUNNEL RELEASE;  Surgeon: Cammie Sickle., MD;  Location: [facility];  Service: Orthopedics;   Laterality: Right;  . CERVICAL DISCECTOMY  2007  . colonscopy     polyps removed / benign  . HERNIA REPAIR    . INCISIONAL HERNIA REPAIR  2011   subcostal from append  . JOINT REPLACEMENT  81,87   lt total hip  . LAPAROSCOPIC APPENDECTOMY  2010   required open repair  . SHOULDER ARTHROSCOPY WITH ROTATOR CUFF REPAIR Right 02/12/2020   Procedure: SHOULDER ARTHROSCOPY WITH ROTATOR CUFF REPAIR with subacromial decompression distal clavicle resection biceps tenotomy labral debridement;  Surgeon: Sydnee Cabal, MD;  Location: [facility];  Service: Orthopedics;  Laterality: Right;  interscalene block  . TONSILLECTOMY    . TOTAL HIP ARTHROPLASTY     x3 left hip [phone]  . TOTAL HIP ARTHROPLASTY Right 04/05/2016   Procedure: RIGHT TOTAL HIP ARTHROPLASTY ANTERIOR APPROACH;  Surgeon:  Gaynelle Arabian, MD;  Location: [facility];  Service: Orthopedics;  Laterality: Right;    Family History  Problem Relation Age of Onset  . Breast cancer Mother   . Hypertension Mother   . Heart disease Father   . Lung cancer Father   . Hypertension Father   . Breast cancer Sister    Social History:  reports that he has never smoked. He has never used smokeless tobacco. He reports current alcohol use. He reports that he does not use drugs.  Allergies: No Known Allergies  Medications:                                                                                                                        I reviewed home medications   ROS:                                                                                                                                     14 systems reviewed and negative except above    Examination:                                                                                                      General: Appears well-developed  Psych: Affect appropriate to situation Eyes: No scleral injection HENT: No OP obstrucion Head: Normocephalic.  Cardiovascular: Normal rate and regular  rhythm.  Respiratory: Effort normal and breath sounds normal to anterior ascultation GI: Soft.  No distension. There is no tenderness.  Skin: WDI    Neurological Examination Mental Status: Alert, oriented, thought content appropriate.  Speech fluent without evidence of aphasia. Able to follow 3 step commands without difficulty. Cranial Nerves: II: Visual fields grossly normal,  III,IV, VI: ptosis not present, extra-ocular motions intact bilaterally, pupils equal, round, reactive to light and accommodation V,VII: smile symmetric, facial light touch sensation normal bilaterally VIII: hearing normal bilaterally IX,X: uvula rises symmetrically XI: bilateral shoulder shrug XII: midline tongue extension Motor: Right : Upper extremity   5/5  Left:     Upper extremity   5/5  Lower extremity   5/5     Lower extremity   5/5 Tone and bulk:normal tone throughout; no atrophy noted Sensory: Pinprick and light touch intact throughout, bilaterally Deep Tendon Reflexes: 2+ and symmetric throughout Plantars: Right: downgoing   Left: downgoing Cerebellar: normal finger-to-nose, normal rapid alternating movements and normal heel-to-shin test Gait: normal gait and station     Lab Results: Basic Metabolic Panel: Recent Labs  Lab 03/23/20 1617 03/23/20 1652  NA 142 141  K 3.7 3.5  CL 107 106  CO2 22  --   GLUCOSE 123* 123*  BUN 19 21  CREATININE 1.11 1.10  CALCIUM 9.4  --     CBC: Recent Labs  Lab 03/23/20 1617 03/23/20 1652  WBC 7.0  --   NEUTROABS 4.5  --   HGB 13.4 13.3  HCT 42.3 39.0  MCV 96.4  --   PLT 194  --     Coagulation Studies: Recent Labs    03/23/20 1617  LABPROT 13.3  INR 1.1    Imaging: CT Angio Head W or Wo Contrast  Result Date: 03/23/2020 CLINICAL DATA:  New onset left upper extremity paralysis EXAM: CT ANGIOGRAPHY HEAD AND NECK TECHNIQUE: Multidetector CT imaging of the head and neck was performed using the standard protocol during bolus  administration of intravenous contrast. Multiplanar CT image reconstructions and MIPs were obtained to evaluate the vascular anatomy. Carotid stenosis measurements (when applicable) are obtained utilizing NASCET criteria, using the distal internal carotid diameter as the denominator. CONTRAST:  26mL OMNIPAQUE IOHEXOL 350 MG/ML SOLN COMPARISON:  None. FINDINGS: CTA NECK FINDINGS SKELETON: There is no bony spinal canal stenosis. No lytic or blastic lesion. OTHER NECK: Normal pharynx, larynx and major salivary glands. No cervical lymphadenopathy. Unremarkable thyroid gland. UPPER CHEST: No pneumothorax or pleural effusion. No nodules or masses. AORTIC ARCH: There is mild calcific atherosclerosis of the aortic arch. There is no aneurysm, dissection or hemodynamically significant stenosis of the visualized portion of the aorta. Conventional 3 vessel aortic branching pattern. The visualized proximal subclavian arteries are widely patent. RIGHT CAROTID SYSTEM: No dissection, occlusion or aneurysm. There is low density atherosclerosis extending into the proximal ICA, resulting in less than 50% stenosis. LEFT CAROTID SYSTEM: Normal without aneurysm, dissection or stenosis. VERTEBRAL ARTERIES: Left dominant configuration. Both origins are clearly patent. There is no dissection, occlusion or flow-limiting stenosis to the skull base (V1-V3 segments). CTA HEAD FINDINGS POSTERIOR CIRCULATION: --Vertebral arteries: There is moderate atherosclerotic calcification both V4 segments, right greater left. Mild-to-moderate stenosis bilaterally. --Inferior cerebellar arteries: Normal. --Basilar artery: Normal. --Superior cerebellar arteries: Normal. --Posterior cerebral arteries (PCA): Normal. ANTERIOR CIRCULATION: --Intracranial internal carotid arteries: Normal. --Anterior cerebral arteries (ACA): Normal. Both A1 segments are present. Patent anterior communicating artery (a-comm). --Middle cerebral arteries (MCA): Normal. VENOUS  SINUSES: As permitted by contrast timing, patent. ANATOMIC VARIANTS: None Review of the MIP images confirms the above findings. IMPRESSION: 1. No emergent large vessel occlusion or high-grade stenosis of the intracranial arteries. 2. Bilateral proximal internal carotid artery atherosclerosis without hemodynamically significant stenosis by NASCET criteria. 3. Moderate bilateral vertebral artery V4 segment atherosclerotic calcification with mild-to-moderate stenosis bilaterally. 4. Aortic Atherosclerosis (ICD10-I70.0). Electronically Signed   By: Ulyses Jarred M.D.   On: 03/23/2020 23:11   CT HEAD WO CONTRAST  Result Date: 03/23/2020 CLINICAL DATA:  Neuro deficit, acute, stroke suspected Possible stroke Patient reports left arm weakness and numbness, onset today. Symptoms lasted 3 minutes and  now resolved. EXAM: CT HEAD WITHOUT CONTRAST TECHNIQUE: Contiguous axial images were obtained from the base of the skull through the vertex without intravenous contrast. COMPARISON:  None. FINDINGS: Brain: Age related atrophy. No intracranial hemorrhage, mass effect, or midline shift. No hydrocephalus. The basilar cisterns are patent. No evidence of territorial infarct or acute ischemia. No extra-axial or intracranial fluid collection. Vascular: Atherosclerosis of skullbase vasculature without hyperdense vessel or abnormal calcification. Skull: No fracture or focal lesion. Sinuses/Orbits: Paranasal sinuses and mastoid air cells are clear. The visualized orbits are unremarkable. Other: None. IMPRESSION: Unremarkable head CT for age. Electronically Signed   By: Keith Rake M.D.   On: 03/23/2020 17:44   CT Angio Neck W and/or Wo Contrast  Result Date: 03/23/2020 CLINICAL DATA:  New onset left upper extremity paralysis EXAM: CT ANGIOGRAPHY HEAD AND NECK TECHNIQUE: Multidetector CT imaging of the head and neck was performed using the standard protocol during bolus administration of intravenous contrast. Multiplanar CT image  reconstructions and MIPs were obtained to evaluate the vascular anatomy. Carotid stenosis measurements (when applicable) are obtained utilizing NASCET criteria, using the distal internal carotid diameter as the denominator. CONTRAST:  70mL OMNIPAQUE IOHEXOL 350 MG/ML SOLN COMPARISON:  None. FINDINGS: CTA NECK FINDINGS SKELETON: There is no bony spinal canal stenosis. No lytic or blastic lesion. OTHER NECK: Normal pharynx, larynx and major salivary glands. No cervical lymphadenopathy. Unremarkable thyroid gland. UPPER CHEST: No pneumothorax or pleural effusion. No nodules or masses. AORTIC ARCH: There is mild calcific atherosclerosis of the aortic arch. There is no aneurysm, dissection or hemodynamically significant stenosis of the visualized portion of the aorta. Conventional 3 vessel aortic branching pattern. The visualized proximal subclavian arteries are widely patent. RIGHT CAROTID SYSTEM: No dissection, occlusion or aneurysm. There is low density atherosclerosis extending into the proximal ICA, resulting in less than 50% stenosis. LEFT CAROTID SYSTEM: Normal without aneurysm, dissection or stenosis. VERTEBRAL ARTERIES: Left dominant configuration. Both origins are clearly patent. There is no dissection, occlusion or flow-limiting stenosis to the skull base (V1-V3 segments). CTA HEAD FINDINGS POSTERIOR CIRCULATION: --Vertebral arteries: There is moderate atherosclerotic calcification both V4 segments, right greater left. Mild-to-moderate stenosis bilaterally. --Inferior cerebellar arteries: Normal. --Basilar artery: Normal. --Superior cerebellar arteries: Normal. --Posterior cerebral arteries (PCA): Normal. ANTERIOR CIRCULATION: --Intracranial internal carotid arteries: Normal. --Anterior cerebral arteries (ACA): Normal. Both A1 segments are present. Patent anterior communicating artery (a-comm). --Middle cerebral arteries (MCA): Normal. VENOUS SINUSES: As permitted by contrast timing, patent. ANATOMIC VARIANTS:  None Review of the MIP images confirms the above findings. IMPRESSION: 1. No emergent large vessel occlusion or high-grade stenosis of the intracranial arteries. 2. Bilateral proximal internal carotid artery atherosclerosis without hemodynamically significant stenosis by NASCET criteria. 3. Moderate bilateral vertebral artery V4 segment atherosclerotic calcification with mild-to-moderate stenosis bilaterally. 4. Aortic Atherosclerosis (ICD10-I70.0). Electronically Signed   By: Ulyses Jarred M.D.   On: 03/23/2020 23:11   MR BRAIN WO CONTRAST  Result Date: 03/23/2020 CLINICAL DATA:  Transient ischemic attack.  Negative head CT. EXAM: MRI HEAD WITHOUT CONTRAST TECHNIQUE: Multiplanar, multiecho pulse sequences of the brain and surrounding structures were obtained without intravenous contrast. COMPARISON:  Head CT same day FINDINGS: Brain: Diffusion imaging does not show any acute or subacute infarction. No focal abnormality affects the brainstem or cerebellum. Cerebral hemispheres are normal except for a single white matter focus in the deep white matter of the right frontoparietal junction consistent with an old small vessel infarction. No mass lesion, hemorrhage, hydrocephalus or extra-axial collection. Vascular: Major vessels at  the base of the brain show flow. Skull and upper cervical spine: Negative Sinuses/Orbits: Clear/normal Other: None IMPRESSION: No acute finding. 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 Electronically Signed   By: Nelson Chimes M.D.   On: 03/23/2020 21:22     ASSESSMENT AND PLAN  69 y.o. male with past medical history significant for hypertension, hyperlipidemia presents to emergency department on 7/6 with sudden onset left upper extremity weakness.  Patient underwent MRI brain which was negative for stroke.  CTA shows bilateral carotid stenosis, less than 50% on both sides.  Patient also with bilateral vertebral  stenosis.  Transient ischemic attack  Recommend # MRI of the brain without contrast #MRA Head and neck  #Transthoracic Echo  # Start patient on ASA 81 mg and Plavix 75 mg daily #Start or continue Atorvastatin 80 mg/other high intensity statin # BP goal: permissive HTN upto 220/120 mmHg  # Telemetry monitoring # Frequent neuro checks # NPO until passes stroke swallow screen  Please page stroke NP  Or  PA  Or MD from 8am -4 pm  as this patient from this time will be  followed by the stroke.   You can look them up on [email or web address]  Password [facility]    Enrica Corliss [facility] Pager Number [phone]

## 2020-03-24 NOTE — Evaluation (Addendum)
Occupational Therapy Evaluation Patient Details Name: KEYLAN COSTABILE MRN: [id number] DOB: [birth year] Today's Date: 03/24/2020    History of Present Illness 69 y.o male presenting with LUE weakness. MRI negative for acute findings. PMH includes HTN, HLD, hx of COVID-19, R RC repair.   Clinical Impression   PTA pt living with spouse, still working and very independent. He reports going to rehab currently for RUE rotator cuff rehab and for his lower back. He uses a cane at baseline due to R leg discrepancy. At time of eval, pt presents back to baseline for BADLs. Able to complete all mobility without LOB or safety concerns. No residual LUE deficits noted. OT will sign off, thank you.    Follow Up Recommendations  No OT follow up    Equipment Recommendations  None recommended by OT    Recommendations for Other Services       Precautions / Restrictions Precautions Precautions: None Precaution Comments: L leg shorter at baseline; still RUE precautions from [facility] repair 6 weeks ago Restrictions Weight Bearing Restrictions: No Other Position/Activity Restrictions: still wears sling but not present on eval      Mobility Bed Mobility Overal bed mobility: Independent                Transfers Overall transfer level: Modified independent Equipment used: Straight cane                  Balance Overall balance assessment: Modified Independent                                         ADL either performed or assessed with clinical judgement   ADL Overall ADL's : Modified independent;At baseline                                       General ADL Comments: Pt is at mod I for BADL. Able to stand at sink for grooming and complete household level of functional mobility without external assisance. Uses cane from baseline     Vision Baseline Vision/History: Wears glasses Wears Glasses: At all times Patient Visual Report: No change from baseline        Perception     Praxis      Pertinent Vitals/Pain Pain Assessment: No/denies pain     Hand Dominance Right   Extremity/Trunk Assessment Upper Extremity Assessment Upper Extremity Assessment: RUE deficits/detail;LUE deficits/detail RUE Deficits / Details: RC repair 6 weeks ago, still maintains NWB LUE Deficits / Details: WFL; no residual deficits noted   Lower Extremity Assessment Lower Extremity Assessment: LLE deficits/detail LLE Deficits / Details: leg length discrepency at baseline       Communication Communication Communication: No difficulties   Cognition Arousal/Alertness: Awake/alert Behavior During Therapy: WFL for tasks assessed/performed Overall Cognitive Status: Within Functional Limits for tasks assessed                                     General Comments       Exercises     Shoulder Instructions      Home Living Family/patient expects to be discharged to:: Private residence Living Arrangements: Spouse/significant other Available Help at Discharge: Family;Available 24 hours/day Type of Home: House Home Access: Stairs to  enter Entrance Stairs-Number of Steps: 5   Home Layout: One level     Bathroom Shower/Tub: Occupational psychologist: [place]: Environmental consultant - 2 wheels;Cane - single point          Prior Functioning/Environment Level of Independence: Independent with assistive device(s)        Comments: works from home; insurance; uses cane in community sometimes due to hip/back pain. Currently recieivng therapy for R RC repair and back pain        OT Problem List: Decreased activity tolerance      OT Treatment/Interventions:      OT Goals(Current goals can be found in the care plan section) Acute Rehab OT Goals Patient Stated Goal: return home OT Goal Formulation: All assessment and education complete, DC therapy  OT Frequency:     Barriers to D/C:            Co-evaluation               AM-PAC OT "6 Clicks" Daily Activity     Outcome Measure Help from another person eating meals?: None Help from another person taking care of personal grooming?: None Help from another person toileting, which includes using toliet, bedpan, or urinal?: None Help from another person bathing (including washing, rinsing, drying)?: None Help from another person to put on and taking off regular upper body clothing?: None Help from another person to put on and taking off regular lower body clothing?: None 6 Click Score: 24   End of Session Equipment Utilized During Treatment: Other (comment) (cane) Nurse Communication: Mobility status  Activity Tolerance: Patient tolerated treatment well Patient left: in chair;with call bell/phone within reach  OT Visit Diagnosis: Other abnormalities of gait and mobility (R26.89)                Time: 6578-4696 OT Time Calculation (min): 19 min Charges:  OT General Charges $OT Visit: 1 Visit OT Evaluation $OT Eval Low Complexity: 1 Low  Zenovia Jarred, MSOT, OTR/L Acute Rehabilitation Services [facility] Office Number: [phone] Pager: [phone]  Zenovia Jarred 03/24/2020, 12:26 PM

## 2020-03-24 NOTE — TOC Initial Note (Signed)
Transition of Care [facility]) - Initial/Assessment Note    Patient Details  Name: Colin [name] MRN: [id number] Date of Birth: August 26, 1951  Transition of Care [facility]) CM/SW Contact:    Colin Favre, RN Phone Number: 03/24/2020, 3:25 PM  Clinical Narrative:                  Patient from home with wife. Has cane. Goes to OP Therapy in [facility]  Expected Discharge Plan: Home/Self Care Barriers to Discharge: Continued Medical Work up   Patient Goals and CMS Choice Patient states their goals for this hospitalization and ongoing recovery are:: to return to home CMS Medicare.gov Compare Post Acute Care list provided to:: Patient Choice offered to / list presented to : NA  Expected Discharge Plan and Services Expected Discharge Plan: Home/Self Care       Living arrangements for the past 2 months: Single Family Home                 DME Arranged: N/A                    Prior Living Arrangements/Services Living arrangements for the past 2 months: Single Family Home Lives with:: Spouse Patient language and need for interpreter reviewed:: Yes Do you feel safe going back to the place where you live?: Yes      Need for Family Participation in Patient Care: Yes (Comment) Care giver support system in place?: Yes (comment) Current home services: DME (cane) Criminal Activity/Legal Involvement Pertinent to Current Situation/Hospitalization: No - Comment as needed  Activities of Daily Living      Permission Sought/Granted   Permission granted to share information with : Yes, Verbal Permission Granted  Share Information with NAME: wife, Colin [name]           Emotional Assessment Appearance:: Appears stated age Attitude/Demeanor/Rapport: Engaged Affect (typically observed): Accepting Orientation: : Oriented to Self, Oriented to Place, Oriented to  Time, Oriented to Situation Alcohol / Substance Use: Not Applicable Psych Involvement: No (comment)  Admission diagnosis:  TIA  (transient ischemic attack) [G45.9] Patient Active Problem List   Diagnosis Date Noted  . TIA (transient ischemic attack) 03/23/2020  . Swelling of lower extremity 03/15/2020  . Nocturnal hypoxia 03/15/2020  . Bilateral leg paresthesia 03/15/2020  . Right rotator cuff tear 02/12/2020  . NSIP (nonspecific interstitial pneumonia) ([place]) 07/31/2019  . History of 2019 novel coronavirus disease (COVID-19) 07/27/2019  . Hypothyroidism 05/19/2019  . GERD (gastroesophageal reflux disease) 05/19/2019  . Hyperlipidemia 05/19/2019  . OA (osteoarthritis) of hip 04/05/2016  . Dyspnea 04/19/2012  . Hypertension    PCP:  Colin Contras, MD Pharmacy:   [id number] - [place], [place] - [address] [place] [address] Phone: [phone] Fax: [phone]     Social Determinants of Health (SDOH) Interventions    Readmission Risk Interventions No flowsheet data found.

## 2020-03-24 NOTE — Progress Notes (Signed)
PT Cancellation Note  Patient Details Name: Colin [name] MRN: [id number] DOB: [birth year]   Cancelled Treatment:    Reason Eval/Treat Not Completed: Patient at procedure or test/unavailable.  Having Echocardiogram, will reattempt at another time.   Ramond Dial 03/24/2020, 11:13 AM   Mee Hives, PT MS Acute Rehab Dept. Number: [place]

## 2020-03-24 NOTE — ED Notes (Signed)
Attempted report x 2 
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
## 2020-03-25 ENCOUNTER — Encounter (HOSPITAL_COMMUNITY)
Admission: EM | Disposition: A | Discharge status: Home / Self Care | Payer: Self-pay | Source: Home / Self Care | Attending: Internal Medicine

## 2020-03-25 DIAGNOSIS — E038 Other specified hypothyroidism: Secondary | ICD-10-CM

## 2020-03-25 DIAGNOSIS — I10 Essential (primary) hypertension: Secondary | ICD-10-CM

## 2020-03-25 DIAGNOSIS — I639 Cerebral infarction, unspecified: Secondary | ICD-10-CM

## 2020-03-25 HISTORY — PX: LOOP RECORDER INSERTION: EP1214

## 2020-03-25 LAB — PHOSPHORUS: Phosphorus: 4.3 mg/dL (ref 2.5–4.6)

## 2020-03-25 LAB — CBC
HCT: 37.9 % — ABNORMAL LOW (ref 39.0–52.0)
Hemoglobin: 12.2 g/dL — ABNORMAL LOW (ref 13.0–17.0)
MCH: 30 pg (ref 26.0–34.0)
MCHC: 32.2 g/dL (ref 30.0–36.0)
MCV: 93.1 fL (ref 80.0–100.0)
Platelets: 182 10*3/uL (ref 150–400)
RBC: 4.07 MIL/uL — ABNORMAL LOW (ref 4.22–5.81)
RDW: 13.1 % (ref 11.5–15.5)
WBC: 5.6 10*3/uL (ref 4.0–10.5)
nRBC: 0 % (ref 0.0–0.2)

## 2020-03-25 LAB — COMPREHENSIVE METABOLIC PANEL
ALT: 27 U/L (ref 0–44)
AST: 36 U/L (ref 15–41)
Albumin: 3.2 g/dL — ABNORMAL LOW (ref 3.5–5.0)
Alkaline Phosphatase: 37 U/L — ABNORMAL LOW (ref 38–126)
Anion gap: 10 (ref 5–15)
BUN: 17 mg/dL (ref 8–23)
CO2: 24 mmol/L (ref 22–32)
Calcium: 9.6 mg/dL (ref 8.9–10.3)
Chloride: 105 mmol/L (ref 98–111)
Creatinine, Ser: 0.99 mg/dL (ref 0.61–1.24)
GFR calc Af Amer: >60 mL/min (ref >60)
GFR calc non Af Amer: >60 mL/min (ref >60)
Glucose, Bld: 116 mg/dL — ABNORMAL HIGH (ref 70–99)
Potassium: 3.9 mmol/L (ref 3.5–5.1)
Sodium: 139 mmol/L (ref 135–145)
Total Bilirubin: 0.8 mg/dL (ref 0.3–1.2)
Total Protein: 6.1 g/dL — ABNORMAL LOW (ref 6.5–8.1)

## 2020-03-25 LAB — MAGNESIUM: Magnesium: 2 mg/dL (ref 1.7–2.4)

## 2020-03-25 SURGERY — LOOP RECORDER INSERTION

## 2020-03-25 MED ORDER — LIDOCAINE-EPINEPHRINE 1 %-1:100000 IJ SOLN
INTRAMUSCULAR | Status: DC | PRN
  Administered 2020-03-25: 30 mL

## 2020-03-25 MED ORDER — LIDOCAINE-EPINEPHRINE 1 %-1:100000 IJ SOLN
INTRAMUSCULAR | Status: AC
  Filled 2020-03-25: qty 1

## 2020-03-25 SURGICAL SUPPLY — 2 items
MONITOR REVEAL LINQ II (Prosthesis & Implant Heart) ×1 IMPLANT
PACK LOOP INSERTION (CUSTOM PROCEDURE TRAY) ×2 IMPLANT

## 2020-03-25 NOTE — Progress Notes (Signed)
RN gave pt and his wife discharge instructions and they stated understanding. Research medications were picked up from pharmacy and given to the patient Colin [name] with [employer] team at bedside speaking with patient about loop recorder and will discharge after

## 2020-03-25 NOTE — Progress Notes (Signed)
STROKE TEAM PROGRESS NOTE   INTERVAL HISTORY Patient is doing well.  He has had no recurrent TIA or strokes.  He is participating in the BMS stroke study and study specific MRI does show tiny right frontal motor infarct. which actually was also noticeable on the previous MRI but had not been reported.  Vitals:   03/25/20 0034 03/25/20 0441 03/25/20 0807 03/25/20 1100  BP: 134/86 110/78 117/81 (!) 145/94  Pulse: 82 69 70 72  Resp: 16 16 16 16   Temp: 98 F (36.7 C) 97.6 F (36.4 C) (!) 97.4 F (36.3 C) 98 F (36.7 C)  TempSrc: Oral Oral Oral Oral  SpO2: 93% 100% 95% 95%  Weight:  125.8 kg     CBC:  Recent Labs  Lab 03/23/20 1617 03/23/20 1652 03/24/20 0425 03/25/20 0309  WBC 7.0   < > 6.0 5.6  NEUTROABS 4.5  --   --   --   HGB 13.4   < > 12.3* 12.2*  HCT 42.3   < > 38.0* 37.9*  MCV 96.4   < > 93.6 93.1  PLT 194   < > 185 182   < > = values in this interval not displayed.   Basic Metabolic Panel:  Recent Labs  Lab 03/24/20 0425 03/24/20 1444 03/25/20 0309  NA 142  --  139  K 3.6  --  3.9  CL 108  --  105  CO2 25  --  24  GLUCOSE 132*  --  116*  BUN 18  --  17  CREATININE 0.87  --  0.99  CALCIUM 9.1  --  9.6  MG 1.9  --  2.0  PHOS  --  3.8 4.3   Lipid Panel:  Recent Labs  Lab 03/24/20 0425  CHOL 139  TRIG 277*  HDL 34*  CHOLHDL 4.1  VLDL 55*  LDLCALC 50   HgbA1c:  Recent Labs  Lab 03/24/20 0425  HGBA1C 5.8*   Urine Drug Screen: No results for input(s): LABOPIA, COCAINSCRNUR, LABBENZ, AMPHETMU, THCU, LABBARB in the last 168 hours.  Alcohol Level No results for input(s): ETH in the last 168 hours.  IMAGING past 24 hours MR BRAIN WO CONTRAST  Result Date: 03/24/2020 CLINICAL DATA:  Stroke, follow-up; BMS study EXAM: MRI HEAD WITHOUT CONTRAST TECHNIQUE: Multiplanar, multiecho pulse sequences of the brain and surrounding structures were obtained without intravenous contrast. COMPARISON:  03/23/2020 FINDINGS: Brain: There is a punctate focus of reduced  diffusion involving the dorsal cortex of the right precentral gyrus just lateral to the hand motor region. This is present on the prior study in retrospect. No evidence of intracranial hemorrhage. Ventricles and sulci are within normal limits in size and configuration. There is a chronic small vessel infarct of the right centrum semiovale. Additional minimal foci of T2 hyperintensity in the supratentorial white matter are nonspecific but may reflect minor chronic microvascular ischemic changes. Vascular: Major vessel flow voids at the skull base are preserved. Skull and upper cervical spine: Normal marrow signal is preserved. Sinuses/Orbits: Paranasal sinuses are aerated. No acute orbital finding. Other: Sella is unremarkable.  Mastoid air cells are clear. IMPRESSION: Punctate acute infarct of the right precentral gyrus also present on the prior study. Electronically Signed   By: Macy Mis M.D.   On: 03/24/2020 18:48    PHYSICAL EXAM Pleasant mildly obese middle-aged Caucasian male not in distress.  Right shoulder movements are limited due to rotator cuff surgery and pain . Afebrile. Head is nontraumatic. Neck  is supple without bruit.    Cardiac exam no murmur or gallop. Lungs are clear to auscultation. Distal pulses are well felt. Neurological Exam ;  Awake  Alert oriented x 3. Normal speech and language.eye movements full without nystagmus.fundi were not visualized. Vision acuity and fields appear normal. Hearing is normal. Palatal movements are normal. Face symmetric. Tongue midline. Normal strength, tone, reflexes and coordination.  Right shoulder movements limited due to pain normal sensation. Gait deferred. NIH stroke scale 0.  Premorbid modified Rankin scale 1 due to shoulder pain ASSESSMENT/PLAN Mr. KENDRIK MCSHAN is a 69 y.o. male with history of HTN, HLD, ovesity presenting with LUE weakness and numbness.   R brain  subcortical TIA on presentation but MRI scan in retrospect shows tiny  right frontal cortical infarct  CT head Unremarkable   MRI  No acute abnormality. 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  CTA head & neck no LVO. B proximal ICA atherosclerosis. 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 Aortic atherosclerosis.   2D Echo w/ bubble EF 60-65%. No source of embolus   LDL 50  HgbA1c 5.8  VTE prophylaxis - Lovenox 40 mg sq daily   aspirin 81 mg daily prior to admission, now on aspirin 81 mg daily and clopidogrel 75 mg daily. Continue DAPT x 3 weeks then plavix alone - pt is considering BMS trial. [facility] will follow up with him.  Therapy recommendations:  No OT  Disposition:  Anticipate return home  Hypertension  Stable . BP goal normotensive  Hyperlipidemia  Home meds:  crestor 20 + fish oil, Crestor resumed in hospital  LDL 50, goal < 70  Continue statin + fish oil at discharge  Other Stroke Risk Factors  Advanced age  Obesity, Body mass index is 36.59 kg/m., recommend weight loss, diet and exercise as appropriate   Other Active Problems  Hypothyroidism   Hospital day # 1  He presented with transient left upper extremity weakness and numbness likely due to right frontal tiny embolic infarct cryptogenic etiology.He is participating in the BMS Axiomatic stroke study.  Recommend continue BMS stroke study medications for stroke prevention and loop recorder insertion upon discharge for paroxysmal A. fib.  D/w Dr Sherral Hammers.  Follow-up as an outpatient in the stroke research clinic as per stroke study protocol.  Greater than 50% time during this 25-minute visit was spent on counseling and coordination of care about his TIA and discussion about risk for recurrent stroke and stroke prevention strategies and answering questions Antony Contras, MD To contact Stroke Continuity provider, please refer to [email or web address] After hours, contact General Neurology

## 2020-03-25 NOTE — Discharge Summary (Signed)
Physician Discharge Summary  KEROLOS NEHME KGY:[id number] DOB: 11-Jan-1951 DOA: 03/23/2020  PCP: Antony Contras, MD  Admit date: 03/23/2020 Discharge date: 03/25/2020  Time spent: 30 minutes  Recommendations for Outpatient Follow-up:  Covid vaccination; positive vaccination  TIA: -Diagnosis suspected on basis of transient, self-limited acute LUE weakness and left hand numbnessaround 1430 on 03/23/20, which hassubsequently resolved without recurrence,  -CT head showedno evidence of acute intracranial process, including no evidence of acute intracranial hemorrhage. -Neurologist Dr. Malen Gauze, at presentation confirmed that patient is not a candidate for TPA given interval complete resolution of presenting suspected acute neurologic deficit.  -Agreed to participate in BMS trial.  New type of anticoagulant trial being run by stroke team.  Medication will be provided by stroke team. -Patient will be discharged without DPAT given that he is on BMS trial -Follow-up with [facility] in 4 weeks.  Stroke clinic will contact you to set up appointment time.  Essential HTN -All home BP medication on hold allow permissive HTN. -Patient's BP still WNL without BP medication.  PCP to decide when to restart BP medication. -Schedule follow-up with Dr. Antony Contras in 2 weeks, HTN, HLD, hypothyroidism, TIA -Follow-up with [facility] 7/22 @0  900  HLD -Rosuvastatin 20 mg daily -7/7 LDL= 50.  Patient at goal of LDL<70  Acquired Hypothyroidism: .  -Synthroid 75 mcg daily      Discharge Diagnoses:  Principal Problem:   Cryptogenic stroke [facility]) Active Problems:   Hypertension   Hypothyroidism   Hyperlipidemia   Discharge Condition: Stable  Diet recommendation: Heart healthy  Filed Weights   03/25/20 0441  Weight: 125.8 kg    History of present illness:  69 y.o.WM PMHx HTN, HLD,who is admitted to [facility] on 7/6/2021with suspected [place] presenting from  home to [facility] complaining ofleft arm weakness.  The patient reports sudden onsetof left upper weakness around 1430 this afternoon (03/23/20), with concominant numbness, which he believes was limited to the left hand. Reports that these symptoms lasted approximately 3-4 minutes before spontaneous resolution and no ensuing return of these symptoms. He reports chronic right upper extremity weakness in the setting of rotator cuff pathology, and denies any acute worsening of this RUE weakness. Reports chronic bilateral lower extremity paresthesias in the b/l feet, without any acute worsening thereof. Otherwise, the patient denies any acute focal weakness,paresthesias, numbness, dysphagia, dizziness, vertigo, nausea, vomiting, change in vision, blurry vision, diplopia, word finding difficulties, or headache. Denies facial droop or slurring of speech.Denies any associated chest pain, shortness of breath, palpitations, diaphoresis, presyncope, or syncope.  Denies any previous history of strokeor TIA. In terms of modifiable risk factors,the patient acknowledges a history ofhypertension andhyperlipidemia, but denies any known h/odiabetes,atrial fibrillation, or OSA.Reports that he is lifelong non-smoker.Reports good compliance with her outpatient anti-hypertensive medications as well as his home statin. He reports that he is on a daily baby aspirin, with most recent dose occurring on morning of 03/23/20. Otherwise, onno additionalantiplatelet or anticoagulation medications atthistime.  Hospital Course:  See above  Procedures: 7/8 Loop recorder placed  Consultations: Stroke team EP Cardiology   Cultures  7/7 SARS coronavirus negative    Discharge Exam: Vitals:   03/25/20 0034 03/25/20 0441 03/25/20 0807 03/25/20 1100  BP: 134/86 110/78 117/81 (!) 145/94  Pulse: 82 69 70 72  Resp: 16 16 16 16   Temp: 98 F (36.7 C) 97.6 F (36.4 C) (!) 97.4 F (36.3 C)  98 F (36.7 C)  TempSrc: Oral Oral Oral  Oral  SpO2: 93% 100% 95% 95%  Weight:  125.8 kg      General: A/O x4, No acute respiratory distress Eyes: negative scleral hemorrhage, negative anisocoria, negative icterus ENT: Negative Runny nose, negative gingival bleeding, Neck:  Negative scars, masses, torticollis, lymphadenopathy, JVD Lungs: Clear to auscultation bilaterally without wheezes or crackles Cardiovascular: Regular rate and rhythm without murmur gallop or rub normal S1 and S2 Abdomen: negative abdominal pain, nondistended, positive soft, bowel sounds, no rebound, no ascites, no appreciable mass Extremities: No significant cyanosis, clubbing, or edema bilateral lower extremities Skin: Negative rashes, lesions, ulcers Psychiatric:  Negative depression, negative anxiety, negative fatigue, negative mania  Central nervous system:  Cranial nerves II through XII intact, tongue/uvula midline, all extremities muscle strength 5/5, sensation intact throughout,  negative dysarthria, negative expressive aphasia, negative receptive aphasia.   Discharge Instructions  Discharge Instructions    Ambulatory referral to Neurology   Complete by: As directed    Follow up in stroke clinic at [facility] with Frann Rider, NP in about 4 weeks. If not available, consider Dr. Antony Contras, Dr. Bess Harvest, or Dr. Sarina Ill.     Allergies as of 03/25/2020   No Known Allergies     Medication List    STOP taking these medications   aspirin EC 81 MG tablet   losartan-hydrochlorothiazide 50-12.5 MG tablet Commonly known as: HYZAAR   lovastatin 40 MG tablet Commonly known as: MEVACOR   methocarbamol 500 MG tablet Commonly known as: Robaxin   ondansetron 4 MG tablet Commonly known as: Zofran     TAKE these medications   acetaminophen 500 MG tablet Commonly known as: TYLENOL Take 1,000 mg by mouth every 6 (six) hours as needed for mild pain or moderate pain.    albuterol 108 (90 Base) MCG/ACT inhaler Commonly known as: VENTOLIN HFA Inhale 2 puffs into the lungs every 6 (six) hours as needed for wheezing or shortness of breath.   Fish Oil 1000 MG Caps Take 1,000 mg by mouth daily.   ibuprofen 200 MG tablet Commonly known as: ADVIL Take 400 mg by mouth every 6 (six) hours as needed for moderate pain.   levothyroxine 75 MCG tablet Commonly known as: SYNTHROID Take 75 mcg by mouth daily.   omeprazole 40 MG capsule Commonly known as: PRILOSEC Take 40 mg by mouth daily.   rosuvastatin 20 MG tablet Commonly known as: CRESTOR Take 1 tablet (20 mg total) by mouth daily.      No Known Allergies  Follow-up Information    [facility] Follow up in 4 week(s).   Specialty: Neurology Why: stroke clinic. office will call with appt date and time.  Contact information: [facility] Follow up on 04/08/2020.   Why: at 0900 for post loop recorder wound check Contact information: [place] [address] [phone]       Antony Contras, MD. Schedule an appointment as soon as possible for a visit in 2 week(s).   Specialty: Family Medicine Why: Schedule follow-up with Dr. Antony Contras in 2 weeks, HTN, HLD, hypothyroidism, TIA Contact information: [place], [place] [address] [phone]                The results of significant diagnostics from this hospitalization (including imaging, microbiology, ancillary and laboratory) are listed below for reference.  Significant Diagnostic Studies: CT Angio Head W or Wo Contrast  Result Date: 03/23/2020 CLINICAL DATA:  New onset left upper extremity paralysis EXAM: CT ANGIOGRAPHY HEAD AND NECK TECHNIQUE: Multidetector CT imaging of the head and neck was performed using the standard  protocol during bolus administration of intravenous contrast. Multiplanar CT image reconstructions and MIPs were obtained to evaluate the vascular anatomy. Carotid stenosis measurements (when applicable) are obtained utilizing NASCET criteria, using the distal internal carotid diameter as the denominator. CONTRAST:  29mL OMNIPAQUE IOHEXOL 350 MG/ML SOLN COMPARISON:  None. FINDINGS: CTA NECK FINDINGS SKELETON: There is no bony spinal canal stenosis. No lytic or blastic lesion. OTHER NECK: Normal pharynx, larynx and major salivary glands. No cervical lymphadenopathy. Unremarkable thyroid gland. UPPER CHEST: No pneumothorax or pleural effusion. No nodules or masses. AORTIC ARCH: There is mild calcific atherosclerosis of the aortic arch. There is no aneurysm, dissection or hemodynamically significant stenosis of the visualized portion of the aorta. Conventional 3 vessel aortic branching pattern. The visualized proximal subclavian arteries are widely patent. RIGHT CAROTID SYSTEM: No dissection, occlusion or aneurysm. There is low density atherosclerosis extending into the proximal ICA, resulting in less than 50% stenosis. LEFT CAROTID SYSTEM: Normal without aneurysm, dissection or stenosis. VERTEBRAL ARTERIES: Left dominant configuration. Both origins are clearly patent. There is no dissection, occlusion or flow-limiting stenosis to the skull base (V1-V3 segments). CTA HEAD FINDINGS POSTERIOR CIRCULATION: --Vertebral arteries: There is moderate atherosclerotic calcification both V4 segments, right greater left. Mild-to-moderate stenosis bilaterally. --Inferior cerebellar arteries: Normal. --Basilar artery: Normal. --Superior cerebellar arteries: Normal. --Posterior cerebral arteries (PCA): Normal. ANTERIOR CIRCULATION: --Intracranial internal carotid arteries: Normal. --Anterior cerebral arteries (ACA): Normal. Both A1 segments are present. Patent anterior communicating artery (a-comm). --Middle cerebral arteries (MCA):  Normal. VENOUS SINUSES: As permitted by contrast timing, patent. ANATOMIC VARIANTS: None Review of the MIP images confirms the above findings. IMPRESSION: 1. No emergent large vessel occlusion or high-grade stenosis of the intracranial arteries. 2. Bilateral proximal internal carotid artery atherosclerosis without hemodynamically significant stenosis by NASCET criteria. 3. Moderate bilateral vertebral artery V4 segment atherosclerotic calcification with mild-to-moderate stenosis bilaterally. 4. Aortic Atherosclerosis (ICD10-I70.0). Electronically Signed   By: Ulyses Jarred M.D.   On: 03/23/2020 23:11   CT HEAD WO CONTRAST  Result Date: 03/23/2020 CLINICAL DATA:  Neuro deficit, acute, stroke suspected Possible stroke Patient reports left arm weakness and numbness, onset today. Symptoms lasted 3 minutes and now resolved. EXAM: CT HEAD WITHOUT CONTRAST TECHNIQUE: Contiguous axial images were obtained from the base of the skull through the vertex without intravenous contrast. COMPARISON:  None. FINDINGS: Brain: Age related atrophy. No intracranial hemorrhage, mass effect, or midline shift. No hydrocephalus. The basilar cisterns are patent. No evidence of territorial infarct or acute ischemia. No extra-axial or intracranial fluid collection. Vascular: Atherosclerosis of skullbase vasculature without hyperdense vessel or abnormal calcification. Skull: No fracture or focal lesion. Sinuses/Orbits: Paranasal sinuses and mastoid air cells are clear. The visualized orbits are unremarkable. Other: None. IMPRESSION: Unremarkable head CT for age. Electronically Signed   By: Keith Rake M.D.   On: 03/23/2020 17:44   CT Angio Neck W and/or Wo Contrast  Result Date: 03/23/2020 CLINICAL DATA:  New onset left upper extremity paralysis EXAM: CT ANGIOGRAPHY HEAD AND NECK TECHNIQUE: Multidetector CT imaging of the head and neck was performed using the standard protocol during bolus administration of intravenous contrast.  Multiplanar CT image reconstructions and MIPs were obtained to evaluate the vascular anatomy. Carotid stenosis measurements (when applicable) are obtained utilizing NASCET  criteria, using the distal internal carotid diameter as the denominator. CONTRAST:  72mL OMNIPAQUE IOHEXOL 350 MG/ML SOLN COMPARISON:  None. FINDINGS: CTA NECK FINDINGS SKELETON: There is no bony spinal canal stenosis. No lytic or blastic lesion. OTHER NECK: Normal pharynx, larynx and major salivary glands. No cervical lymphadenopathy. Unremarkable thyroid gland. UPPER CHEST: No pneumothorax or pleural effusion. No nodules or masses. AORTIC ARCH: There is mild calcific atherosclerosis of the aortic arch. There is no aneurysm, dissection or hemodynamically significant stenosis of the visualized portion of the aorta. Conventional 3 vessel aortic branching pattern. The visualized proximal subclavian arteries are widely patent. RIGHT CAROTID SYSTEM: No dissection, occlusion or aneurysm. There is low density atherosclerosis extending into the proximal ICA, resulting in less than 50% stenosis. LEFT CAROTID SYSTEM: Normal without aneurysm, dissection or stenosis. VERTEBRAL ARTERIES: Left dominant configuration. Both origins are clearly patent. There is no dissection, occlusion or flow-limiting stenosis to the skull base (V1-V3 segments). CTA HEAD FINDINGS POSTERIOR CIRCULATION: --Vertebral arteries: There is moderate atherosclerotic calcification both V4 segments, right greater left. Mild-to-moderate stenosis bilaterally. --Inferior cerebellar arteries: Normal. --Basilar artery: Normal. --Superior cerebellar arteries: Normal. --Posterior cerebral arteries (PCA): Normal. ANTERIOR CIRCULATION: --Intracranial internal carotid arteries: Normal. --Anterior cerebral arteries (ACA): Normal. Both A1 segments are present. Patent anterior communicating artery (a-comm). --Middle cerebral arteries (MCA): Normal. VENOUS SINUSES: As permitted by contrast timing,  patent. ANATOMIC VARIANTS: None Review of the MIP images confirms the above findings. IMPRESSION: 1. No emergent large vessel occlusion or high-grade stenosis of the intracranial arteries. 2. Bilateral proximal internal carotid artery atherosclerosis without hemodynamically significant stenosis by NASCET criteria. 3. Moderate bilateral vertebral artery V4 segment atherosclerotic calcification with mild-to-moderate stenosis bilaterally. 4. Aortic Atherosclerosis (ICD10-I70.0). Electronically Signed   By: Ulyses Jarred M.D.   On: 03/23/2020 23:11   CT Chest Wo Contrast  Result Date: 03/01/2020 CLINICAL DATA:  COVID in September. Shortness of breath on exertion since. EXAM: CT CHEST WITHOUT CONTRAST TECHNIQUE: Multidetector CT imaging of the chest was performed following the standard protocol without IV contrast. COMPARISON:  1321 10/01/2019 FINDINGS: Cardiovascular: Aortic atherosclerosis. Normal heart size, without pericardial effusion. Multivessel coronary artery atherosclerosis. Pulmonary artery enlargement, outflow tract 3.2 cm Mediastinum/Nodes: No mediastinal or definite hilar adenopathy, given limitations of unenhanced CT. Tiny hiatal hernia. Lungs/Pleura: No pleural fluid. Similar appearance of slightly lower lung predominant areas of scattered subpleural ground-glass opacity, cylindrical bronchiectasis, mild architectural distortion, and subpleural bronchiolectasis. No superimposed acute consolidation. Scattered pulmonary nodules on the order of 5 mm and less are subpleural predominant, favored to represent subpleural lymph nodes. Upper Abdomen: Normal imaged portions of the liver, spleen, pancreas, gallbladder, adrenal glands, kidneys. Musculoskeletal: Lower cervical spine fixation. Moderate thoracic spondylosis. Convex right thoracic spine curvature. IMPRESSION: 1. 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 No  evidence of superimposed acute process. 2. Coronary artery atherosclerosis. Aortic Atherosclerosis (ICD10-I70.0). 3. Pulmonary nodules are similar, favored to represent subpleural lymph nodes. Per consensus criteria, follow-up in November of 2021 can be considered if patient is high risk. This recommendation follows the consensus statement: Guidelines for Management of Incidental Pulmonary Nodules Detected on CT Images:From the Fleischner Society 2017; published online before print (10.1148/radiol.2992426834). 4. Pulmonary artery enlargement suggests pulmonary arterial hypertension. Electronically Signed   By: Abigail Miyamoto M.D.   On: 03/01/2020 16:13   MR BRAIN WO CONTRAST  Result Date: 03/24/2020 CLINICAL DATA:  Stroke, follow-up; BMS study EXAM: MRI HEAD WITHOUT CONTRAST TECHNIQUE: Multiplanar, multiecho pulse sequences of  the brain and surrounding structures were obtained without intravenous contrast. COMPARISON:  03/23/2020 FINDINGS: Brain: There is a punctate focus of reduced diffusion involving the dorsal cortex of the right precentral gyrus just lateral to the hand motor region. This is present on the prior study in retrospect. No evidence of intracranial hemorrhage. Ventricles and sulci are within normal limits in size and configuration. There is a chronic small vessel infarct of the right centrum semiovale. Additional minimal foci of T2 hyperintensity in the supratentorial white matter are nonspecific but may reflect minor chronic microvascular ischemic changes. Vascular: Major vessel flow voids at the skull base are preserved. Skull and upper cervical spine: Normal marrow signal is preserved. Sinuses/Orbits: Paranasal sinuses are aerated. No acute orbital finding. Other: Sella is unremarkable.  Mastoid air cells are clear. IMPRESSION: Punctate acute infarct of the right precentral gyrus also present on the prior study. Electronically Signed   By: Macy Mis M.D.   On: 03/24/2020 18:48   MR BRAIN  WO CONTRAST  Result Date: 03/23/2020 CLINICAL DATA:  Transient ischemic attack.  Negative head CT. EXAM: MRI HEAD WITHOUT CONTRAST TECHNIQUE: Multiplanar, multiecho pulse sequences of the brain and surrounding structures were obtained without intravenous contrast. COMPARISON:  Head CT same day FINDINGS: Brain: Diffusion imaging does not show any acute or subacute infarction. No focal abnormality affects the brainstem or cerebellum. Cerebral hemispheres are normal except for a single white matter focus in the deep white matter of the right frontoparietal junction consistent with an old small vessel infarction. No mass lesion, hemorrhage, hydrocephalus or extra-axial collection. Vascular: Major vessels at the base of the brain show flow. Skull and upper cervical spine: Negative Sinuses/Orbits: Clear/normal Other: None IMPRESSION: No acute finding. 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 Electronically Signed   By: Nelson Chimes M.D.   On: 03/23/2020 21:22   EP PPM/ICD IMPLANT  Result Date: 03/25/2020 SURGEON:  Thompson Grayer, MD   PREPROCEDURE DIAGNOSIS:  Cryptogenic Stroke   POSTPROCEDURE DIAGNOSIS:  Cryptogenic Stroke    PROCEDURES:  1. Implantable loop recorder implantation   INTRODUCTION:  TYBERIUS RYNER is a 69 y.o. male with a history of unexplained stroke who presents today for implantable loop implantation.  The patient has had a cryptogenic stroke.  Despite an extensive workup by neurology, no reversible causes have been identified.  he has worn telemetry during which he did not have arrhythmias.  There is significant concern for possible atrial fibrillation as the cause for the patients stroke.  The patient therefore presents today for implantable loop implantation.   DESCRIPTION OF PROCEDURE:  Informed written consent was obtained.  The patient required no sedation for the procedure today.  The patients left chest was prepped and  draped. Mapping over the patient's chest was performed to identify the appropriate ILR site.  This area was found to be the left  parasternal region over the 3rd-4th intercostal space.  The skin overlying this region was infiltrated with lidocaine for local analgesia.  A 0.5-cm incision was made at the implant site.  A subcutaneous ILR pocket was fashioned using a combination of sharp and blunt dissection.  A Medtronic Reveal Linq model [id number] implantable loop recorder was then placed into the pocket R waves were very prominent and measured > 0.2 mV. EBL<1 ml.  Steri- Strips and a sterile dressing were then applied.  There were no early apparent complications.   CONCLUSIONS:  1. Successful implantation of  a Medtronic Reveal LINQ implantable loop recorder for cryptogenic stroke  2. No early apparent complications. Thompson Grayer MD, [facility] 03/25/2020 3:38 PM   ECHOCARDIOGRAM COMPLETE BUBBLE STUDY  Result Date: 03/24/2020    ECHOCARDIOGRAM REPORT   Patient Name:   JAYTHAN HINELY Date of Exam: 03/24/2020 Medical Rec #:  [id number]      Height:       73.0 in Accession #:    [id number]     Weight:       271.4 lb Date of Birth:  01-11-1951       BSA:          2.450 m Patient Age:    23 years       BP:           143/86 mmHg Patient Gender: M              HR:           62 bpm. Exam Location:  Inpatient Procedure: 2D Echo Indications:    TIA  History:        Patient has prior history of Echocardiogram examinations, most                 recent 10/27/2019. Risk Factors:Hypertension and Dyslipidemia.  Sonographer:    Jannett Celestine RDCS (AE) Referring Phys: [id number] [place]  1. Left ventricular ejection fraction, by estimation, is 60 to 65%. The left ventricle has normal function. The left ventricle has no regional wall motion abnormalities. Left ventricular diastolic parameters were normal.  2. Right ventricular systolic function is normal. The right ventricular size is normal.  3. Negative bubble study for right to  left shunt.  4. The mitral valve is normal in structure. No evidence of mitral valve regurgitation. No evidence of mitral stenosis.  5. Calcified non coronary cusp. The aortic valve was not well visualized. Aortic valve regurgitation is not visualized. Mild to moderate aortic valve sclerosis/calcification is present, without any evidence of aortic stenosis.  6. The inferior vena cava is normal in size with greater than 50% respiratory variability, suggesting right atrial pressure of 3 mmHg. FINDINGS  Left Ventricle: Left ventricular ejection fraction, by estimation, is 60 to 65%. The left ventricle has normal function. The left ventricle has no regional wall motion abnormalities. The left ventricular internal cavity size was normal in size. There is  no left ventricular hypertrophy. Left ventricular diastolic parameters were normal. Right Ventricle: The right ventricular size is normal. No increase in right ventricular wall thickness. Right ventricular systolic function is normal. Left Atrium: Left atrial size was normal in size. Right Atrium: Right atrial size was normal in size. Pericardium: There is no evidence of pericardial effusion. Mitral Valve: The mitral valve is normal in structure. Normal mobility of the mitral valve leaflets. No evidence of mitral valve regurgitation. No evidence of mitral valve stenosis. Tricuspid Valve: The tricuspid valve is normal in structure. Tricuspid valve regurgitation is not demonstrated. No evidence of tricuspid stenosis. Aortic Valve: Calcified non coronary cusp. The aortic valve was not well visualized. Aortic valve regurgitation is not visualized. Mild to moderate aortic valve sclerosis/calcification is present, without any evidence of aortic stenosis. Pulmonic Valve: The pulmonic valve was normal in structure. Pulmonic valve regurgitation is not visualized. No evidence of pulmonic stenosis. Aorta: The aortic root is normal in size and structure. Venous: The inferior vena  cava is normal in size with greater than 50% respiratory variability, suggesting right atrial pressure of 3  mmHg. IAS/Shunts: No atrial level shunt detected by color flow Doppler.  LEFT VENTRICLE PLAX 2D LVIDd:         5.69 cm  Diastology LVIDs:         3.14 cm  LV e' lateral:   10.70 cm/s LV PW:         0.87 cm  LV E/e' lateral: 5.9 LV IVS:        0.83 cm  LV e' medial:    7.29 cm/s LVOT diam:     2.20 cm  LV E/e' medial:  8.7 LV SV:         58 LV SV Index:   24 LVOT Area:     3.80 cm  RIGHT VENTRICLE RV S prime:     12.70 cm/s LEFT ATRIUM             Index LA diam:        3.70 cm 1.51 cm/m LA Vol (A2C):   32.6 ml 13.31 ml/m LA Vol (A4C):   60.7 ml 24.78 ml/m LA Biplane Vol: 45.5 ml 18.57 ml/m  AORTIC VALVE LVOT Vmax:   64.60 cm/s LVOT Vmean:  46.100 cm/s LVOT VTI:    0.152 m  AORTA Ao Root diam: 2.60 cm MITRAL VALVE MV Area (PHT): 2.11 cm    SHUNTS MV Decel Time: 359 msec    Systemic VTI:  0.15 m MV E velocity: 63.40 cm/s  Systemic Diam: 2.20 cm MV A velocity: 30.40 cm/s MV E/A ratio:  2.09 Jenkins Rouge MD Electronically signed by Jenkins Rouge MD Signature Date/Time: 03/24/2020/11:55:05 AM    Final     Microbiology: Recent Results (from the past 240 hour(s))  SARS Coronavirus 2 by RT PCR (hospital order, performed in [place] hospital lab) Nasopharyngeal Nasopharyngeal Swab     Status: None   Collection Time: 03/23/20  9:40 PM   Specimen: Nasopharyngeal Swab  Result Value Ref Range Status   SARS Coronavirus 2 NEGATIVE NEGATIVE Final    Comment: (NOTE) SARS-CoV-2 target nucleic acids are NOT DETECTED.  The SARS-CoV-2 RNA is generally detectable in upper and lower respiratory specimens during the acute phase of infection. The lowest concentration of SARS-CoV-2 viral copies this assay can detect is 250 copies / mL. A negative result does not preclude SARS-CoV-2 infection and should not be used as the sole basis for treatment or other patient management decisions.  A negative result may occur  with improper specimen collection / handling, submission of specimen other than nasopharyngeal swab, presence of viral mutation(s) within the areas targeted by this assay, and inadequate number of viral copies (<250 copies / mL). A negative result must be combined with clinical observations, patient history, and epidemiological information.  Fact Sheet for Patients:   StrictlyIdeas.no  Fact Sheet for Healthcare Providers: BankingDealers.co.za  This test is not yet approved or  cleared by the [place] FDA and has been authorized for detection and/or diagnosis of SARS-CoV-2 by FDA under an Emergency Use Authorization (EUA).  This EUA will remain in effect (meaning this test can be used) for the duration of the COVID-19 declaration under Section 564(b)(1) of the Act, 21 U.S.C. section 360bbb-3(b)(1), unless the authorization is terminated or revoked sooner.  Performed at [facility], [place] [address], [place], [place] [address]      Labs: Basic Metabolic Panel: Recent Labs  Lab 03/23/20 1617 03/23/20 1652 03/24/20 0425 03/24/20 1444 03/25/20 0309  NA 142 141 142  --  139  K 3.7 3.5 3.6  --  3.9  CL 107 106 108  --  105  CO2 22  --  25  --  24  GLUCOSE 123* 123* 132*  --  116*  BUN 19 21 18   --  17  CREATININE 1.11 1.10 0.87  --  0.99  CALCIUM 9.4  --  9.1  --  9.6  MG  --   --  1.9  --  2.0  PHOS  --   --   --  3.8 4.3   Liver Function Tests: Recent Labs  Lab 03/23/20 1617 03/25/20 0309  AST 45* 36  ALT 33 27  ALKPHOS 44 37*  BILITOT 0.7 0.8  PROT 6.9 6.1*  ALBUMIN 3.8 3.2*   No results for input(s): LIPASE, AMYLASE in the last 168 hours. No results for input(s): AMMONIA in the last 168 hours. CBC: Recent Labs  Lab 03/23/20 1617 03/23/20 1652 03/24/20 0425 03/25/20 0309  WBC 7.0  --  6.0 5.6  NEUTROABS 4.5  --   --   --   HGB 13.4 13.3 12.3* 12.2*  HCT 42.3 39.0 38.0* 37.9*  MCV 96.4  --  93.6  93.1  PLT 194  --  185 182   Cardiac Enzymes: No results for input(s): CKTOTAL, CKMB, CKMBINDEX, TROPONINI in the last 168 hours. BNP: BNP (last 3 results) No results for input(s): BNP in the last 8760 hours.  ProBNP (last 3 results) No results for input(s): PROBNP in the last 8760 hours.  CBG: Recent Labs  Lab 03/23/20 2324 03/24/20 2044  GLUCAP 88 137*       Signed:  Dia Crawford, MD [facility] [phone] pager

## 2020-03-25 NOTE — H&P (View-Only) (Signed)
ELECTROPHYSIOLOGY CONSULT NOTE  Patient ID: Colin [name] MRN: [id number], DOB/AGE: September 13, 1951   Admit date: 03/23/2020 Date of Consult: 03/25/2020  Primary Physician: Antony Contras, MD Primary Cardiologist: No primary care provider on file.  Primary Electrophysiologist: New to Dr. Rayann Heman Reason for Consultation: Cryptogenic stroke; recommendations regarding Implantable Loop Recorder Insurance: Medicare  History of Present Illness EP has been asked to evaluate [name] for placement of an implantable loop recorder to monitor for atrial fibrillation by Dr Leonie Man.  The patient was admitted on 03/23/2020 with transient LUE weakness and numbness.  Imaging demonstrated R brain subcortical small vessel disease.  They have undergone workup for stroke including echocardiogram and CTA Head and Neck.  The patient has been monitored on telemetry which has demonstrated sinus rhythm with no arrhythmias.  Inpatient stroke work-up will not require a TEE per Neurology.   Repeat MRI 7/7 showed acute punctate infarct of the right precentral gyrus that was present, but not noted on initial study.   Echocardiogram this admission demonstrated EF 60-65% and negative bubble study.  Lab work is reviewed.  Prior to admission, the patient denies chest pain, shortness of breath, dizziness, palpitations, or syncope.  They are recovering from their stroke with plans to return home  at discharge.  Past Medical History:  Diagnosis Date  . Arthritis   . Bowel trouble    Stool leakage since colonscopy 02/2016  . Cancer ([place])    skin cancer  . Carpal tunnel syndrome, bilateral   . Depression    in teenage years   . GERD (gastroesophageal reflux disease)   . Hyperlipemia   . Hypertension   . Hypothyroidism   . Interstitial lung disease ([place])   . Pneumonia   . Shortness of breath dyspnea    with bending over   . Wears glasses      Surgical History:  Past Surgical History:  Procedure Laterality Date  .  APPENDECTOMY    . CARPAL TUNNEL RELEASE  04/23/2012   Procedure: CARPAL TUNNEL RELEASE;  Surgeon: Cammie Sickle., MD;  Location: [facility];  Service: Orthopedics;  Laterality: Left;  . CARPAL TUNNEL RELEASE  06/27/2012   Procedure: CARPAL TUNNEL RELEASE;  Surgeon: Cammie Sickle., MD;  Location: [facility];  Service: Orthopedics;  Laterality: Right;  . CERVICAL DISCECTOMY  2007  . colonscopy     polyps removed / benign  . HERNIA REPAIR    . INCISIONAL HERNIA REPAIR  2011   subcostal from append  . JOINT REPLACEMENT  81,87   lt total hip  . LAPAROSCOPIC APPENDECTOMY  2010   required open repair  . SHOULDER ARTHROSCOPY WITH ROTATOR CUFF REPAIR Right 02/12/2020   Procedure: SHOULDER ARTHROSCOPY WITH ROTATOR CUFF REPAIR with subacromial decompression distal clavicle resection biceps tenotomy labral debridement;  Surgeon: Sydnee Cabal, MD;  Location: [facility];  Service: Orthopedics;  Laterality: Right;  interscalene block  . TONSILLECTOMY    . TOTAL HIP ARTHROPLASTY     x3 left hip [phone]  . TOTAL HIP ARTHROPLASTY Right 04/05/2016   Procedure: RIGHT TOTAL HIP ARTHROPLASTY ANTERIOR APPROACH;  Surgeon: Gaynelle Arabian, MD;  Location: [facility];  Service: Orthopedics;  Laterality: Right;     Medications Prior to Admission  Medication Sig Dispense Refill Last Dose  . acetaminophen (TYLENOL) 500 MG tablet Take 1,000 mg by mouth every 6 (six) hours as needed for mild pain or moderate pain.   Past Week at Unknown time  .  albuterol (VENTOLIN HFA) 108 (90 Base) MCG/ACT inhaler Inhale 2 puffs into the lungs every 6 (six) hours as needed for wheezing or shortness of breath. 6.7 g 6 Past Month at Unknown time  . aspirin EC 81 MG tablet Take 1 tablet (81 mg total) by mouth daily. 90 tablet 3 03/23/2020 at Unknown time  . levothyroxine (SYNTHROID) 75 MCG tablet Take 75 mcg by mouth daily.    03/23/2020 at Unknown time  . losartan-hydrochlorothiazide (HYZAAR) 50-12.5 MG  per tablet Take 1 tablet by mouth daily.   03/23/2020 at Unknown time  . Omega-3 Fatty Acids (FISH OIL) 1000 MG CAPS Take 1,000 mg by mouth daily.    03/23/2020 at Unknown time  . omeprazole (PRILOSEC) 40 MG capsule Take 40 mg by mouth daily.   03/23/2020 at Unknown time  . rosuvastatin (CRESTOR) 20 MG tablet Take 1 tablet (20 mg total) by mouth daily. 90 tablet 1 03/23/2020 at Unknown time  . ibuprofen (ADVIL) 200 MG tablet Take 400 mg by mouth every 6 (six) hours as needed for moderate pain.      Marland Kitchen lovastatin (MEVACOR) 40 MG tablet Take 40 mg by mouth daily.      . methocarbamol (ROBAXIN) 500 MG tablet Take 1 tablet (500 mg total) by mouth 4 (four) times daily. (Patient not taking: Reported on 03/23/2020) 30 tablet 0 Not Taking at Unknown time  . ondansetron (ZOFRAN) 4 MG tablet Take 1 tablet (4 mg total) by mouth daily as needed for nausea or vomiting. (Patient not taking: Reported on 03/23/2020) 30 tablet 1 Not Taking at Unknown time    Inpatient Medications:  . levothyroxine  75 mcg Oral Daily  . rosuvastatin  20 mg Oral Daily  . sodium chloride flush  3 mL Intravenous Once  . sodium chloride flush  3 mL Intravenous Q12H  . STUDY - AXIOMATIC - aspirin 100mg  (PI - Sethi)  100 mg Oral QAC breakfast  . STUDY - AXIOMATIC - [id number] or placebo (PI - Sethi)  2 capsule Oral BID  . STUDY - AXIOMATIC - clopidogrel 75 mg (PI - Sethi)  75 mg Oral Daily    Allergies: No Known Allergies  Social History   Socioeconomic History  . Marital status: Married    Spouse name: Not on file  . Number of children: 3  . Years of education: Not on file  . Highest education level: Not on file  Occupational History  . Occupation: IT sales professional: [employer]  Tobacco Use  . Smoking status: Never Smoker  . Smokeless tobacco: Never Used  Vaping Use  . Vaping Use: Never used  Substance and Sexual Activity  . Alcohol use: Yes    Comment: ocassionally  . Drug use: No  . Sexual activity:  Not on file  Other Topics Concern  . Not on file  Social History Narrative  . Not on file   Social Determinants of Health   Financial Resource Strain:   . Difficulty of Paying Living Expenses:   Food Insecurity:   . Worried About Charity fundraiser in the Last Year:   . Arboriculturist in the Last Year:   Transportation Needs:   . Film/video editor (Medical):   Marland Kitchen Lack of Transportation (Non-Medical):   Physical Activity:   . Days of Exercise per Week:   . Minutes of Exercise per Session:   Stress:   . Feeling of Stress :   Social Connections:   .  Frequency of Communication with Friends and Family:   . Frequency of Social Gatherings with Friends and Family:   . Attends Religious Services:   . Active Member of Clubs or Organizations:   . Attends Archivist Meetings:   Marland Kitchen Marital Status:   Intimate Partner Violence:   . Fear of Current or Ex-Partner:   . Emotionally Abused:   Marland Kitchen Physically Abused:   . Sexually Abused:      Family History  Problem Relation Age of Onset  . Breast cancer Mother   . Hypertension Mother   . Heart disease Father   . Lung cancer Father   . Hypertension Father   . Breast cancer Sister       Review of Systems: All other systems reviewed and are otherwise negative except as noted above.  Physical Exam: Vitals:   03/25/20 0034 03/25/20 0441 03/25/20 0807 03/25/20 1100  BP: 134/86 110/78 117/81 (!) 145/94  Pulse: 82 69 70 72  Resp: 16 16 16 16   Temp: 98 F (36.7 C) 97.6 F (36.4 C) (!) 97.4 F (36.3 C) 98 F (36.7 C)  TempSrc: Oral Oral Oral Oral  SpO2: 93% 100% 95% 95%  Weight:  125.8 kg      GEN- The patient is well appearing, alert and oriented x 3 today.   Head- normocephalic, atraumatic Eyes-  Sclera clear, conjunctiva pink Ears- hearing intact Oropharynx- clear Neck- supple Lungs- Clear to ausculation bilaterally, normal work of breathing Heart- Regular rate and rhythm, no murmurs, rubs or gallops  GI-  soft, NT, ND, + BS Extremities- no clubbing, cyanosis, or edema MS- no significant deformity or atrophy Skin- no rash or lesion Psych- euthymic mood, full affect   Labs:   Lab Results  Component Value Date   WBC 5.6 03/25/2020   HGB 12.2 (L) 03/25/2020   HCT 37.9 (L) 03/25/2020   MCV 93.1 03/25/2020   PLT 182 03/25/2020    Recent Labs  Lab 03/25/20 0309  NA 139  K 3.9  CL 105  CO2 24  BUN 17  CREATININE 0.99  CALCIUM 9.6  PROT 6.1*  BILITOT 0.8  ALKPHOS 37*  ALT 27  AST 36  GLUCOSE 116*     Radiology/Studies: CT Angio Head W or Wo Contrast  Result Date: 03/23/2020 CLINICAL DATA:  New onset left upper extremity paralysis EXAM: CT ANGIOGRAPHY HEAD AND NECK TECHNIQUE: Multidetector CT imaging of the head and neck was performed using the standard protocol during bolus administration of intravenous contrast. Multiplanar CT image reconstructions and MIPs were obtained to evaluate the vascular anatomy. Carotid stenosis measurements (when applicable) are obtained utilizing NASCET criteria, using the distal internal carotid diameter as the denominator. CONTRAST:  58mL OMNIPAQUE IOHEXOL 350 MG/ML SOLN COMPARISON:  None. FINDINGS: CTA NECK FINDINGS SKELETON: There is no bony spinal canal stenosis. No lytic or blastic lesion. OTHER NECK: Normal pharynx, larynx and major salivary glands. No cervical lymphadenopathy. Unremarkable thyroid gland. UPPER CHEST: No pneumothorax or pleural effusion. No nodules or masses. AORTIC ARCH: There is mild calcific atherosclerosis of the aortic arch. There is no aneurysm, dissection or hemodynamically significant stenosis of the visualized portion of the aorta. Conventional 3 vessel aortic branching pattern. The visualized proximal subclavian arteries are widely patent. RIGHT CAROTID SYSTEM: No dissection, occlusion or aneurysm. There is low density atherosclerosis extending into the proximal ICA, resulting in less than 50% stenosis. LEFT CAROTID SYSTEM:  Normal without aneurysm, dissection or stenosis. VERTEBRAL ARTERIES: Left dominant configuration. Both origins  are clearly patent. There is no dissection, occlusion or flow-limiting stenosis to the skull base (V1-V3 segments). CTA HEAD FINDINGS POSTERIOR CIRCULATION: --Vertebral arteries: There is moderate atherosclerotic calcification both V4 segments, right greater left. Mild-to-moderate stenosis bilaterally. --Inferior cerebellar arteries: Normal. --Basilar artery: Normal. --Superior cerebellar arteries: Normal. --Posterior cerebral arteries (PCA): Normal. ANTERIOR CIRCULATION: --Intracranial internal carotid arteries: Normal. --Anterior cerebral arteries (ACA): Normal. Both A1 segments are present. Patent anterior communicating artery (a-comm). --Middle cerebral arteries (MCA): Normal. VENOUS SINUSES: As permitted by contrast timing, patent. ANATOMIC VARIANTS: None Review of the MIP images confirms the above findings. IMPRESSION: 1. No emergent large vessel occlusion or high-grade stenosis of the intracranial arteries. 2. Bilateral proximal internal carotid artery atherosclerosis without hemodynamically significant stenosis by NASCET criteria. 3. Moderate bilateral vertebral artery V4 segment atherosclerotic calcification with mild-to-moderate stenosis bilaterally. 4. Aortic Atherosclerosis (ICD10-I70.0). Electronically Signed   By: Ulyses Jarred M.D.   On: 03/23/2020 23:11   CT HEAD WO CONTRAST  Result Date: 03/23/2020 CLINICAL DATA:  Neuro deficit, acute, stroke suspected Possible stroke Patient reports left arm weakness and numbness, onset today. Symptoms lasted 3 minutes and now resolved. EXAM: CT HEAD WITHOUT CONTRAST TECHNIQUE: Contiguous axial images were obtained from the base of the skull through the vertex without intravenous contrast. COMPARISON:  None. FINDINGS: Brain: Age related atrophy. No intracranial hemorrhage, mass effect, or midline shift. No hydrocephalus. The basilar cisterns are patent.  No evidence of territorial infarct or acute ischemia. No extra-axial or intracranial fluid collection. Vascular: Atherosclerosis of skullbase vasculature without hyperdense vessel or abnormal calcification. Skull: No fracture or focal lesion. Sinuses/Orbits: Paranasal sinuses and mastoid air cells are clear. The visualized orbits are unremarkable. Other: None. IMPRESSION: Unremarkable head CT for age. Electronically Signed   By: Keith Rake M.D.   On: 03/23/2020 17:44   CT Angio Neck W and/or Wo Contrast  Result Date: 03/23/2020 CLINICAL DATA:  New onset left upper extremity paralysis EXAM: CT ANGIOGRAPHY HEAD AND NECK TECHNIQUE: Multidetector CT imaging of the head and neck was performed using the standard protocol during bolus administration of intravenous contrast. Multiplanar CT image reconstructions and MIPs were obtained to evaluate the vascular anatomy. Carotid stenosis measurements (when applicable) are obtained utilizing NASCET criteria, using the distal internal carotid diameter as the denominator. CONTRAST:  90mL OMNIPAQUE IOHEXOL 350 MG/ML SOLN COMPARISON:  None. FINDINGS: CTA NECK FINDINGS SKELETON: There is no bony spinal canal stenosis. No lytic or blastic lesion. OTHER NECK: Normal pharynx, larynx and major salivary glands. No cervical lymphadenopathy. Unremarkable thyroid gland. UPPER CHEST: No pneumothorax or pleural effusion. No nodules or masses. AORTIC ARCH: There is mild calcific atherosclerosis of the aortic arch. There is no aneurysm, dissection or hemodynamically significant stenosis of the visualized portion of the aorta. Conventional 3 vessel aortic branching pattern. The visualized proximal subclavian arteries are widely patent. RIGHT CAROTID SYSTEM: No dissection, occlusion or aneurysm. There is low density atherosclerosis extending into the proximal ICA, resulting in less than 50% stenosis. LEFT CAROTID SYSTEM: Normal without aneurysm, dissection or stenosis. VERTEBRAL ARTERIES:  Left dominant configuration. Both origins are clearly patent. There is no dissection, occlusion or flow-limiting stenosis to the skull base (V1-V3 segments). CTA HEAD FINDINGS POSTERIOR CIRCULATION: --Vertebral arteries: There is moderate atherosclerotic calcification both V4 segments, right greater left. Mild-to-moderate stenosis bilaterally. --Inferior cerebellar arteries: Normal. --Basilar artery: Normal. --Superior cerebellar arteries: Normal. --Posterior cerebral arteries (PCA): Normal. ANTERIOR CIRCULATION: --Intracranial internal carotid arteries: Normal. --Anterior cerebral arteries (ACA): Normal. Both A1 segments are present. Patent anterior  communicating artery (a-comm). --Middle cerebral arteries (MCA): Normal. VENOUS SINUSES: As permitted by contrast timing, patent. ANATOMIC VARIANTS: None Review of the MIP images confirms the above findings. IMPRESSION: 1. No emergent large vessel occlusion or high-grade stenosis of the intracranial arteries. 2. Bilateral proximal internal carotid artery atherosclerosis without hemodynamically significant stenosis by NASCET criteria. 3. Moderate bilateral vertebral artery V4 segment atherosclerotic calcification with mild-to-moderate stenosis bilaterally. 4. Aortic Atherosclerosis (ICD10-I70.0). Electronically Signed   By: Ulyses Jarred M.D.   On: 03/23/2020 23:11   CT Chest Wo Contrast  Result Date: 03/01/2020 CLINICAL DATA:  COVID in September. Shortness of breath on exertion since. EXAM: CT CHEST WITHOUT CONTRAST TECHNIQUE: Multidetector CT imaging of the chest was performed following the standard protocol without IV contrast. COMPARISON:  1321 10/01/2019 FINDINGS: Cardiovascular: Aortic atherosclerosis. Normal heart size, without pericardial effusion. Multivessel coronary artery atherosclerosis. Pulmonary artery enlargement, outflow tract 3.2 cm Mediastinum/Nodes: No mediastinal or definite hilar adenopathy, given limitations of unenhanced CT. Tiny hiatal  hernia. Lungs/Pleura: No pleural fluid. Similar appearance of slightly lower lung predominant areas of scattered subpleural ground-glass opacity, cylindrical bronchiectasis, mild architectural distortion, and subpleural bronchiolectasis. No superimposed acute consolidation. Scattered pulmonary nodules on the order of 5 mm and less are subpleural predominant, favored to represent subpleural lymph nodes. Upper Abdomen: Normal imaged portions of the liver, spleen, pancreas, gallbladder, adrenal glands, kidneys. Musculoskeletal: Lower cervical spine fixation. Moderate thoracic spondylosis. Convex right thoracic spine curvature. IMPRESSION: 1. 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 No evidence of superimposed acute process. 2. Coronary artery atherosclerosis. Aortic Atherosclerosis (ICD10-I70.0). 3. Pulmonary nodules are similar, favored to represent subpleural lymph nodes. Per consensus criteria, follow-up in November of 2021 can be considered if patient is high risk. This recommendation follows the consensus statement: Guidelines for Management of Incidental Pulmonary Nodules Detected on CT Images:From the Fleischner Society 2017; published online before print (10.1148/radiol.[id number]). 4. Pulmonary artery enlargement suggests pulmonary arterial hypertension. Electronically Signed   By: Abigail Miyamoto M.D.   On: 03/01/2020 16:13   MR BRAIN WO CONTRAST  Result Date: 03/24/2020 CLINICAL DATA:  Stroke, follow-up; BMS study EXAM: MRI HEAD WITHOUT CONTRAST TECHNIQUE: Multiplanar, multiecho pulse sequences of the brain and surrounding structures were obtained without intravenous contrast. COMPARISON:  03/23/2020 FINDINGS: Brain: There is a punctate focus of reduced diffusion involving the dorsal cortex of the right precentral gyrus just lateral to the hand motor region. This is present on the prior study in retrospect. No evidence  of intracranial hemorrhage. Ventricles and sulci are within normal limits in size and configuration. There is a chronic small vessel infarct of the right centrum semiovale. Additional minimal foci of T2 hyperintensity in the supratentorial white matter are nonspecific but may reflect minor chronic microvascular ischemic changes. Vascular: Major vessel flow voids at the skull base are preserved. Skull and upper cervical spine: Normal marrow signal is preserved. Sinuses/Orbits: Paranasal sinuses are aerated. No acute orbital finding. Other: Sella is unremarkable.  Mastoid air cells are clear. IMPRESSION: Punctate acute infarct of the right precentral gyrus also present on the prior study. Electronically Signed   By: Macy Mis M.D.   On: 03/24/2020 18:48   MR BRAIN WO CONTRAST  Result Date: 03/23/2020 CLINICAL DATA:  Transient ischemic attack.  Negative head CT. EXAM: MRI HEAD WITHOUT CONTRAST TECHNIQUE: Multiplanar, multiecho pulse sequences of the brain and surrounding structures were obtained without intravenous contrast. COMPARISON:  Head CT same day FINDINGS: Brain: Diffusion imaging does  not show any acute or subacute infarction. No focal abnormality affects the brainstem or cerebellum. Cerebral hemispheres are normal except for a single white matter focus in the deep white matter of the right frontoparietal junction consistent with an old small vessel infarction. No mass lesion, hemorrhage, hydrocephalus or extra-axial collection. Vascular: Major vessels at the base of the brain show flow. Skull and upper cervical spine: Negative Sinuses/Orbits: Clear/normal Other: None IMPRESSION: No acute finding. 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 Electronically Signed   By: Nelson Chimes M.D.   On: 03/23/2020 21:22   ECHOCARDIOGRAM COMPLETE BUBBLE STUDY  Result Date: 03/24/2020    ECHOCARDIOGRAM REPORT   Patient Name:   JARRAD MCLEES Lish Date  of Exam: 03/24/2020 Medical Rec #:  [id number]      Height:       73.0 in Accession #:    [id number]     Weight:       271.4 lb Date of Birth:  12-06-1950       BSA:          2.450 m Patient Age:    68 years       BP:           143/86 mmHg Patient Gender: M              HR:           62 bpm. Exam Location:  Inpatient Procedure: 2D Echo Indications:    TIA  History:        Patient has prior history of Echocardiogram examinations, most                 recent 10/27/2019. Risk Factors:Hypertension and Dyslipidemia.  Sonographer:    Jannett Celestine RDCS (AE) Referring Phys: [id number] [place]  1. Left ventricular ejection fraction, by estimation, is 60 to 65%. The left ventricle has normal function. The left ventricle has no regional wall motion abnormalities. Left ventricular diastolic parameters were normal.  2. Right ventricular systolic function is normal. The right ventricular size is normal.  3. Negative bubble study for right to left shunt.  4. The mitral valve is normal in structure. No evidence of mitral valve regurgitation. No evidence of mitral stenosis.  5. Calcified non coronary cusp. The aortic valve was not well visualized. Aortic valve regurgitation is not visualized. Mild to moderate aortic valve sclerosis/calcification is present, without any evidence of aortic stenosis.  6. The inferior vena cava is normal in size with greater than 50% respiratory variability, suggesting right atrial pressure of 3 mmHg. FINDINGS  Left Ventricle: Left ventricular ejection fraction, by estimation, is 60 to 65%. The left ventricle has normal function. The left ventricle has no regional wall motion abnormalities. The left ventricular internal cavity size was normal in size. There is  no left ventricular hypertrophy. Left ventricular diastolic parameters were normal. Right Ventricle: The right ventricular size is normal. No increase in right ventricular wall thickness. Right ventricular systolic function is  normal. Left Atrium: Left atrial size was normal in size. Right Atrium: Right atrial size was normal in size. Pericardium: There is no evidence of pericardial effusion. Mitral Valve: The mitral valve is normal in structure. Normal mobility of the mitral valve leaflets. No evidence of mitral valve regurgitation. No evidence of mitral valve stenosis. Tricuspid Valve: The tricuspid valve is normal in structure. Tricuspid valve regurgitation is not demonstrated. No evidence of tricuspid stenosis.  Aortic Valve: Calcified non coronary cusp. The aortic valve was not well visualized. Aortic valve regurgitation is not visualized. Mild to moderate aortic valve sclerosis/calcification is present, without any evidence of aortic stenosis. Pulmonic Valve: The pulmonic valve was normal in structure. Pulmonic valve regurgitation is not visualized. No evidence of pulmonic stenosis. Aorta: The aortic root is normal in size and structure. Venous: The inferior vena cava is normal in size with greater than 50% respiratory variability, suggesting right atrial pressure of 3 mmHg. IAS/Shunts: No atrial level shunt detected by color flow Doppler.  LEFT VENTRICLE PLAX 2D LVIDd:         5.69 cm  Diastology LVIDs:         3.14 cm  LV e' lateral:   10.70 cm/s LV PW:         0.87 cm  LV E/e' lateral: 5.9 LV IVS:        0.83 cm  LV e' medial:    7.29 cm/s LVOT diam:     2.20 cm  LV E/e' medial:  8.7 LV SV:         58 LV SV Index:   24 LVOT Area:     3.80 cm  RIGHT VENTRICLE RV S prime:     12.70 cm/s LEFT ATRIUM             Index LA diam:        3.70 cm 1.51 cm/m LA Vol (A2C):   32.6 ml 13.31 ml/m LA Vol (A4C):   60.7 ml 24.78 ml/m LA Biplane Vol: 45.5 ml 18.57 ml/m  AORTIC VALVE LVOT Vmax:   64.60 cm/s LVOT Vmean:  46.100 cm/s LVOT VTI:    0.152 m  AORTA Ao Root diam: 2.60 cm MITRAL VALVE MV Area (PHT): 2.11 cm    SHUNTS MV Decel Time: 359 msec    Systemic VTI:  0.15 m MV E velocity: 63.40 cm/s  Systemic Diam: 2.20 cm MV A velocity: 30.40  cm/s MV E/A ratio:  2.09 Jenkins Rouge MD Electronically signed by Jenkins Rouge MD Signature Date/Time: 03/24/2020/11:55:05 AM    Final     12-lead ECG on admission showed NSR 80 bpm, normal PR and QRS (personally reviewed) All prior EKG's in EPIC reviewed with no documented atrial fibrillation  Telemetry NSR 60-80s (personally reviewed)  Assessment and Plan:  1. Cryptogenic stroke The patient presents with cryptogenic stroke.  The patient does not have a TEE planned for this AM.  I spoke at length with the patient about monitoring for afib with an implantable loop recorder.  Risks, benefits, and alteratives to implantable loop recorder were discussed with the patient today.   At this time, the patient is very clear in their decision to proceed with implantable loop recorder.   Wound care was reviewed with the patient (keep incision clean and dry for 3 days).  Wound check scheduled and entered in AVS. Please call with questions.   Shirley Friar, PA-C 03/25/2020 12:14 PM I have seen, examined the patient, and reviewed the above assessment and plan.  Changes to above are made where necessary.  On exam, RRR.  The patient presents with neurologic and is found to have acute stroke.  I agree with Dr Leonie Man that there is concern for an arrhythmia as the cause.  I would therefore advise long term monitoring to evaluate for afib as the cause for his stroke.  Risks and benefits to long term monitoring with an ILR were discussed with the  patient today.  He understands risks include but are not limited to bleeding and infection and wishes to proceed at this time.  Co Sign: Thompson Grayer, MD 03/25/2020 2:58 PM

## 2020-03-25 NOTE — Interval H&P Note (Signed)
History and Physical Interval Note:  03/25/2020 3:01 PM  Colin [name]  has presented today for surgery, with the diagnosis of stroke.  The various methods of treatment have been discussed with the patient and family. After consideration of risks, benefits and other options for treatment, the patient has consented to  Procedure(s): LOOP RECORDER INSERTION (N/A) as a surgical intervention.  The patient's history has been reviewed, patient examined, no change in status, stable for surgery.  I have reviewed the patient's chart and labs.  Questions were answered to the patient's satisfaction.     Thompson Grayer

## 2020-03-25 NOTE — Consult Note (Addendum)
ELECTROPHYSIOLOGY CONSULT NOTE  Patient ID: Colin [name] MRN: [id number], DOB/AGE: Aug 27, 1951   Admit date: 03/23/2020 Date of Consult: 03/25/2020  Primary Physician: Antony Contras, MD Primary Cardiologist: No primary care provider on file.  Primary Electrophysiologist: New to Dr. Rayann Heman Reason for Consultation: Cryptogenic stroke; recommendations regarding Implantable Loop Recorder Insurance: Medicare  History of Present Illness EP has been asked to evaluate Colin [name] for placement of an implantable loop recorder to monitor for atrial fibrillation by Dr Leonie Man.  The patient was admitted on 03/23/2020 with transient LUE weakness and numbness.  Imaging demonstrated R brain subcortical small vessel disease.  They have undergone workup for stroke including echocardiogram and CTA Head and Neck.  The patient has been monitored on telemetry which has demonstrated sinus rhythm with no arrhythmias.  Inpatient stroke work-up will not require a TEE per Neurology.   Repeat MRI 7/7 showed acute punctate infarct of the right precentral gyrus that was present, but not noted on initial study.   Echocardiogram this admission demonstrated EF 60-65% and negative bubble study.  Lab work is reviewed.  Prior to admission, the patient denies chest pain, shortness of breath, dizziness, palpitations, or syncope.  They are recovering from their stroke with plans to return home  at discharge.  Past Medical History:  Diagnosis Date  . Arthritis   . Bowel trouble    Stool leakage since colonscopy 02/2016  . Cancer ([place])    skin cancer  . Carpal tunnel syndrome, bilateral   . Depression    in teenage years   . GERD (gastroesophageal reflux disease)   . Hyperlipemia   . Hypertension   . Hypothyroidism   . Interstitial lung disease ([place])   . Pneumonia   . Shortness of breath dyspnea    with bending over   . Wears glasses      Surgical History:  Past Surgical History:  Procedure Laterality Date  .  APPENDECTOMY    . CARPAL TUNNEL RELEASE  04/23/2012   Procedure: CARPAL TUNNEL RELEASE;  Surgeon: Cammie Sickle., MD;  Location: [facility];  Service: Orthopedics;  Laterality: Left;  . CARPAL TUNNEL RELEASE  06/27/2012   Procedure: CARPAL TUNNEL RELEASE;  Surgeon: Cammie Sickle., MD;  Location: [facility];  Service: Orthopedics;  Laterality: Right;  . CERVICAL DISCECTOMY  2007  . colonscopy     polyps removed / benign  . HERNIA REPAIR    . INCISIONAL HERNIA REPAIR  2011   subcostal from append  . JOINT REPLACEMENT  81,87   lt total hip  . LAPAROSCOPIC APPENDECTOMY  2010   required open repair  . SHOULDER ARTHROSCOPY WITH ROTATOR CUFF REPAIR Right 02/12/2020   Procedure: SHOULDER ARTHROSCOPY WITH ROTATOR CUFF REPAIR with subacromial decompression distal clavicle resection biceps tenotomy labral debridement;  Surgeon: Sydnee Cabal, MD;  Location: [facility];  Service: Orthopedics;  Laterality: Right;  interscalene block  . TONSILLECTOMY    . TOTAL HIP ARTHROPLASTY     x3 left hip [phone]  . TOTAL HIP ARTHROPLASTY Right 04/05/2016   Procedure: RIGHT TOTAL HIP ARTHROPLASTY ANTERIOR APPROACH;  Surgeon: Gaynelle Arabian, MD;  Location: [facility];  Service: Orthopedics;  Laterality: Right;     Medications Prior to Admission  Medication Sig Dispense Refill Last Dose  . acetaminophen (TYLENOL) 500 MG tablet Take 1,000 mg by mouth every 6 (six) hours as needed for mild pain or moderate pain.   Past Week at Unknown time  .  albuterol (VENTOLIN HFA) 108 (90 Base) MCG/ACT inhaler Inhale 2 puffs into the lungs every 6 (six) hours as needed for wheezing or shortness of breath. 6.7 g 6 Past Month at Unknown time  . aspirin EC 81 MG tablet Take 1 tablet (81 mg total) by mouth daily. 90 tablet 3 03/23/2020 at Unknown time  . levothyroxine (SYNTHROID) 75 MCG tablet Take 75 mcg by mouth daily.    03/23/2020 at Unknown time  . losartan-hydrochlorothiazide (HYZAAR) 50-12.5 MG  per tablet Take 1 tablet by mouth daily.   03/23/2020 at Unknown time  . Omega-3 Fatty Acids (FISH OIL) 1000 MG CAPS Take 1,000 mg by mouth daily.    03/23/2020 at Unknown time  . omeprazole (PRILOSEC) 40 MG capsule Take 40 mg by mouth daily.   03/23/2020 at Unknown time  . rosuvastatin (CRESTOR) 20 MG tablet Take 1 tablet (20 mg total) by mouth daily. 90 tablet 1 03/23/2020 at Unknown time  . ibuprofen (ADVIL) 200 MG tablet Take 400 mg by mouth every 6 (six) hours as needed for moderate pain.      Marland Kitchen lovastatin (MEVACOR) 40 MG tablet Take 40 mg by mouth daily.      . methocarbamol (ROBAXIN) 500 MG tablet Take 1 tablet (500 mg total) by mouth 4 (four) times daily. (Patient not taking: Reported on 03/23/2020) 30 tablet 0 Not Taking at Unknown time  . ondansetron (ZOFRAN) 4 MG tablet Take 1 tablet (4 mg total) by mouth daily as needed for nausea or vomiting. (Patient not taking: Reported on 03/23/2020) 30 tablet 1 Not Taking at Unknown time    Inpatient Medications:  . levothyroxine  75 mcg Oral Daily  . rosuvastatin  20 mg Oral Daily  . sodium chloride flush  3 mL Intravenous Once  . sodium chloride flush  3 mL Intravenous Q12H  . STUDY - AXIOMATIC - aspirin 100mg  (PI - Sethi)  100 mg Oral QAC breakfast  . STUDY - AXIOMATIC - [id number] or placebo (PI - Sethi)  2 capsule Oral BID  . STUDY - AXIOMATIC - clopidogrel 75 mg (PI - Sethi)  75 mg Oral Daily    Allergies: No Known Allergies  Social History   Socioeconomic History  . Marital status: Married    Spouse name: Not on file  . Number of children: 3  . Years of education: Not on file  . Highest education level: Not on file  Occupational History  . Occupation: IT sales professional: [employer]  Tobacco Use  . Smoking status: Never Smoker  . Smokeless tobacco: Never Used  Vaping Use  . Vaping Use: Never used  Substance and Sexual Activity  . Alcohol use: Yes    Comment: ocassionally  . Drug use: No  . Sexual activity:  Not on file  Other Topics Concern  . Not on file  Social History Narrative  . Not on file   Social Determinants of Health   Financial Resource Strain:   . Difficulty of Paying Living Expenses:   Food Insecurity:   . Worried About Charity fundraiser in the Last Year:   . Arboriculturist in the Last Year:   Transportation Needs:   . Film/video editor (Medical):   Marland Kitchen Lack of Transportation (Non-Medical):   Physical Activity:   . Days of Exercise per Week:   . Minutes of Exercise per Session:   Stress:   . Feeling of Stress :   Social Connections:   .  Frequency of Communication with Friends and Family:   . Frequency of Social Gatherings with Friends and Family:   . Attends Religious Services:   . Active Member of Clubs or Organizations:   . Attends Archivist Meetings:   Marland Kitchen Marital Status:   Intimate Partner Violence:   . Fear of Current or Ex-Partner:   . Emotionally Abused:   Marland Kitchen Physically Abused:   . Sexually Abused:      Family History  Problem Relation Age of Onset  . Breast cancer Mother   . Hypertension Mother   . Heart disease Father   . Lung cancer Father   . Hypertension Father   . Breast cancer Sister       Review of Systems: All other systems reviewed and are otherwise negative except as noted above.  Physical Exam: Vitals:   03/25/20 0034 03/25/20 0441 03/25/20 0807 03/25/20 1100  BP: 134/86 110/78 117/81 (!) 145/94  Pulse: 82 69 70 72  Resp: 16 16 16 16   Temp: 98 F (36.7 C) 97.6 F (36.4 C) (!) 97.4 F (36.3 C) 98 F (36.7 C)  TempSrc: Oral Oral Oral Oral  SpO2: 93% 100% 95% 95%  Weight:  125.8 kg      GEN- The patient is well appearing, alert and oriented x 3 today.   Head- normocephalic, atraumatic Eyes-  Sclera clear, conjunctiva pink Ears- hearing intact Oropharynx- clear Neck- supple Lungs- Clear to ausculation bilaterally, normal work of breathing Heart- Regular rate and rhythm, no murmurs, rubs or gallops  GI-  soft, NT, ND, + BS Extremities- no clubbing, cyanosis, or edema MS- no significant deformity or atrophy Skin- no rash or lesion Psych- euthymic mood, full affect   Labs:   Lab Results  Component Value Date   WBC 5.6 03/25/2020   HGB 12.2 (L) 03/25/2020   HCT 37.9 (L) 03/25/2020   MCV 93.1 03/25/2020   PLT 182 03/25/2020    Recent Labs  Lab 03/25/20 0309  NA 139  K 3.9  CL 105  CO2 24  BUN 17  CREATININE 0.99  CALCIUM 9.6  PROT 6.1*  BILITOT 0.8  ALKPHOS 37*  ALT 27  AST 36  GLUCOSE 116*     Radiology/Studies: CT Angio Head W or Wo Contrast  Result Date: 03/23/2020 CLINICAL DATA:  New onset left upper extremity paralysis EXAM: CT ANGIOGRAPHY HEAD AND NECK TECHNIQUE: Multidetector CT imaging of the head and neck was performed using the standard protocol during bolus administration of intravenous contrast. Multiplanar CT image reconstructions and MIPs were obtained to evaluate the vascular anatomy. Carotid stenosis measurements (when applicable) are obtained utilizing NASCET criteria, using the distal internal carotid diameter as the denominator. CONTRAST:  84mL OMNIPAQUE IOHEXOL 350 MG/ML SOLN COMPARISON:  None. FINDINGS: CTA NECK FINDINGS SKELETON: There is no bony spinal canal stenosis. No lytic or blastic lesion. OTHER NECK: Normal pharynx, larynx and major salivary glands. No cervical lymphadenopathy. Unremarkable thyroid gland. UPPER CHEST: No pneumothorax or pleural effusion. No nodules or masses. AORTIC ARCH: There is mild calcific atherosclerosis of the aortic arch. There is no aneurysm, dissection or hemodynamically significant stenosis of the visualized portion of the aorta. Conventional 3 vessel aortic branching pattern. The visualized proximal subclavian arteries are widely patent. RIGHT CAROTID SYSTEM: No dissection, occlusion or aneurysm. There is low density atherosclerosis extending into the proximal ICA, resulting in less than 50% stenosis. LEFT CAROTID SYSTEM:  Normal without aneurysm, dissection or stenosis. VERTEBRAL ARTERIES: Left dominant configuration. Both origins  are clearly patent. There is no dissection, occlusion or flow-limiting stenosis to the skull base (V1-V3 segments). CTA HEAD FINDINGS POSTERIOR CIRCULATION: --Vertebral arteries: There is moderate atherosclerotic calcification both V4 segments, right greater left. Mild-to-moderate stenosis bilaterally. --Inferior cerebellar arteries: Normal. --Basilar artery: Normal. --Superior cerebellar arteries: Normal. --Posterior cerebral arteries (PCA): Normal. ANTERIOR CIRCULATION: --Intracranial internal carotid arteries: Normal. --Anterior cerebral arteries (ACA): Normal. Both A1 segments are present. Patent anterior communicating artery (a-comm). --Middle cerebral arteries (MCA): Normal. VENOUS SINUSES: As permitted by contrast timing, patent. ANATOMIC VARIANTS: None Review of the MIP images confirms the above findings. IMPRESSION: 1. No emergent large vessel occlusion or high-grade stenosis of the intracranial arteries. 2. Bilateral proximal internal carotid artery atherosclerosis without hemodynamically significant stenosis by NASCET criteria. 3. Moderate bilateral vertebral artery V4 segment atherosclerotic calcification with mild-to-moderate stenosis bilaterally. 4. Aortic Atherosclerosis (ICD10-I70.0). Electronically Signed   By: Ulyses Jarred M.D.   On: 03/23/2020 23:11   CT HEAD WO CONTRAST  Result Date: 03/23/2020 CLINICAL DATA:  Neuro deficit, acute, stroke suspected Possible stroke Patient reports left arm weakness and numbness, onset today. Symptoms lasted 3 minutes and now resolved. EXAM: CT HEAD WITHOUT CONTRAST TECHNIQUE: Contiguous axial images were obtained from the base of the skull through the vertex without intravenous contrast. COMPARISON:  None. FINDINGS: Brain: Age related atrophy. No intracranial hemorrhage, mass effect, or midline shift. No hydrocephalus. The basilar cisterns are patent.  No evidence of territorial infarct or acute ischemia. No extra-axial or intracranial fluid collection. Vascular: Atherosclerosis of skullbase vasculature without hyperdense vessel or abnormal calcification. Skull: No fracture or focal lesion. Sinuses/Orbits: Paranasal sinuses and mastoid air cells are clear. The visualized orbits are unremarkable. Other: None. IMPRESSION: Unremarkable head CT for age. Electronically Signed   By: Keith Rake M.D.   On: 03/23/2020 17:44   CT Angio Neck W and/or Wo Contrast  Result Date: 03/23/2020 CLINICAL DATA:  New onset left upper extremity paralysis EXAM: CT ANGIOGRAPHY HEAD AND NECK TECHNIQUE: Multidetector CT imaging of the head and neck was performed using the standard protocol during bolus administration of intravenous contrast. Multiplanar CT image reconstructions and MIPs were obtained to evaluate the vascular anatomy. Carotid stenosis measurements (when applicable) are obtained utilizing NASCET criteria, using the distal internal carotid diameter as the denominator. CONTRAST:  30mL OMNIPAQUE IOHEXOL 350 MG/ML SOLN COMPARISON:  None. FINDINGS: CTA NECK FINDINGS SKELETON: There is no bony spinal canal stenosis. No lytic or blastic lesion. OTHER NECK: Normal pharynx, larynx and major salivary glands. No cervical lymphadenopathy. Unremarkable thyroid gland. UPPER CHEST: No pneumothorax or pleural effusion. No nodules or masses. AORTIC ARCH: There is mild calcific atherosclerosis of the aortic arch. There is no aneurysm, dissection or hemodynamically significant stenosis of the visualized portion of the aorta. Conventional 3 vessel aortic branching pattern. The visualized proximal subclavian arteries are widely patent. RIGHT CAROTID SYSTEM: No dissection, occlusion or aneurysm. There is low density atherosclerosis extending into the proximal ICA, resulting in less than 50% stenosis. LEFT CAROTID SYSTEM: Normal without aneurysm, dissection or stenosis. VERTEBRAL ARTERIES:  Left dominant configuration. Both origins are clearly patent. There is no dissection, occlusion or flow-limiting stenosis to the skull base (V1-V3 segments). CTA HEAD FINDINGS POSTERIOR CIRCULATION: --Vertebral arteries: There is moderate atherosclerotic calcification both V4 segments, right greater left. Mild-to-moderate stenosis bilaterally. --Inferior cerebellar arteries: Normal. --Basilar artery: Normal. --Superior cerebellar arteries: Normal. --Posterior cerebral arteries (PCA): Normal. ANTERIOR CIRCULATION: --Intracranial internal carotid arteries: Normal. --Anterior cerebral arteries (ACA): Normal. Both A1 segments are present. Patent anterior  communicating artery (a-comm). --Middle cerebral arteries (MCA): Normal. VENOUS SINUSES: As permitted by contrast timing, patent. ANATOMIC VARIANTS: None Review of the MIP images confirms the above findings. IMPRESSION: 1. No emergent large vessel occlusion or high-grade stenosis of the intracranial arteries. 2. Bilateral proximal internal carotid artery atherosclerosis without hemodynamically significant stenosis by NASCET criteria. 3. Moderate bilateral vertebral artery V4 segment atherosclerotic calcification with mild-to-moderate stenosis bilaterally. 4. Aortic Atherosclerosis (ICD10-I70.0). Electronically Signed   By: Ulyses Jarred M.D.   On: 03/23/2020 23:11   CT Chest Wo Contrast  Result Date: 03/01/2020 CLINICAL DATA:  COVID in September. Shortness of breath on exertion since. EXAM: CT CHEST WITHOUT CONTRAST TECHNIQUE: Multidetector CT imaging of the chest was performed following the standard protocol without IV contrast. COMPARISON:  1321 10/01/2019 FINDINGS: Cardiovascular: Aortic atherosclerosis. Normal heart size, without pericardial effusion. Multivessel coronary artery atherosclerosis. Pulmonary artery enlargement, outflow tract 3.2 cm Mediastinum/Nodes: No mediastinal or definite hilar adenopathy, given limitations of unenhanced CT. Tiny hiatal  hernia. Lungs/Pleura: No pleural fluid. Similar appearance of slightly lower lung predominant areas of scattered subpleural ground-glass opacity, cylindrical bronchiectasis, mild architectural distortion, and subpleural bronchiolectasis. No superimposed acute consolidation. Scattered pulmonary nodules on the order of 5 mm and less are subpleural predominant, favored to represent subpleural lymph nodes. Upper Abdomen: Normal imaged portions of the liver, spleen, pancreas, gallbladder, adrenal glands, kidneys. Musculoskeletal: Lower cervical spine fixation. Moderate thoracic spondylosis. Convex right thoracic spine curvature. IMPRESSION: 1. 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 No evidence of superimposed acute process. 2. Coronary artery atherosclerosis. Aortic Atherosclerosis (ICD10-I70.0). 3. Pulmonary nodules are similar, favored to represent subpleural lymph nodes. Per consensus criteria, follow-up in November of 2021 can be considered if patient is high risk. This recommendation follows the consensus statement: Guidelines for Management of Incidental Pulmonary Nodules Detected on CT Images:From the Fleischner Society 2017; published online before print (10.1148/radiol.[id number]). 4. Pulmonary artery enlargement suggests pulmonary arterial hypertension. Electronically Signed   By: Abigail Miyamoto M.D.   On: 03/01/2020 16:13   MR BRAIN WO CONTRAST  Result Date: 03/24/2020 CLINICAL DATA:  Stroke, follow-up; BMS study EXAM: MRI HEAD WITHOUT CONTRAST TECHNIQUE: Multiplanar, multiecho pulse sequences of the brain and surrounding structures were obtained without intravenous contrast. COMPARISON:  03/23/2020 FINDINGS: Brain: There is a punctate focus of reduced diffusion involving the dorsal cortex of the right precentral gyrus just lateral to the hand motor region. This is present on the prior study in retrospect. No evidence  of intracranial hemorrhage. Ventricles and sulci are within normal limits in size and configuration. There is a chronic small vessel infarct of the right centrum semiovale. Additional minimal foci of T2 hyperintensity in the supratentorial white matter are nonspecific but may reflect minor chronic microvascular ischemic changes. Vascular: Major vessel flow voids at the skull base are preserved. Skull and upper cervical spine: Normal marrow signal is preserved. Sinuses/Orbits: Paranasal sinuses are aerated. No acute orbital finding. Other: Sella is unremarkable.  Mastoid air cells are clear. IMPRESSION: Punctate acute infarct of the right precentral gyrus also present on the prior study. Electronically Signed   By: Macy Mis M.D.   On: 03/24/2020 18:48   MR BRAIN WO CONTRAST  Result Date: 03/23/2020 CLINICAL DATA:  Transient ischemic attack.  Negative head CT. EXAM: MRI HEAD WITHOUT CONTRAST TECHNIQUE: Multiplanar, multiecho pulse sequences of the brain and surrounding structures were obtained without intravenous contrast. COMPARISON:  Head CT same day FINDINGS: Brain: Diffusion imaging does  not show any acute or subacute infarction. No focal abnormality affects the brainstem or cerebellum. Cerebral hemispheres are normal except for a single white matter focus in the deep white matter of the right frontoparietal junction consistent with an old small vessel infarction. No mass lesion, hemorrhage, hydrocephalus or extra-axial collection. Vascular: Major vessels at the base of the brain show flow. Skull and upper cervical spine: Negative Sinuses/Orbits: Clear/normal Other: None IMPRESSION: No acute finding. 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 Electronically Signed   By: Nelson Chimes M.D.   On: 03/23/2020 21:22   ECHOCARDIOGRAM COMPLETE BUBBLE STUDY  Result Date: 03/24/2020    ECHOCARDIOGRAM REPORT   Patient Name:   Colin [name] Date  of Exam: 03/24/2020 Medical Rec #:  [id number]      Height:       73.0 in Accession #:    [id number]     Weight:       271.4 lb Date of Birth:  02-02-1951       BSA:          2.450 m Patient Age:    69 years       BP:           143/86 mmHg Patient Gender: M              HR:           62 bpm. Exam Location:  Inpatient Procedure: 2D Echo Indications:    TIA  History:        Patient has prior history of Echocardiogram examinations, most                 recent 10/27/2019. Risk Factors:Hypertension and Dyslipidemia.  Sonographer:    Jannett Celestine RDCS (AE) Referring Phys: [id number] [place]  1. Left ventricular ejection fraction, by estimation, is 60 to 65%. The left ventricle has normal function. The left ventricle has no regional wall motion abnormalities. Left ventricular diastolic parameters were normal.  2. Right ventricular systolic function is normal. The right ventricular size is normal.  3. Negative bubble study for right to left shunt.  4. The mitral valve is normal in structure. No evidence of mitral valve regurgitation. No evidence of mitral stenosis.  5. Calcified non coronary cusp. The aortic valve was not well visualized. Aortic valve regurgitation is not visualized. Mild to moderate aortic valve sclerosis/calcification is present, without any evidence of aortic stenosis.  6. The inferior vena cava is normal in size with greater than 50% respiratory variability, suggesting right atrial pressure of 3 mmHg. FINDINGS  Left Ventricle: Left ventricular ejection fraction, by estimation, is 60 to 65%. The left ventricle has normal function. The left ventricle has no regional wall motion abnormalities. The left ventricular internal cavity size was normal in size. There is  no left ventricular hypertrophy. Left ventricular diastolic parameters were normal. Right Ventricle: The right ventricular size is normal. No increase in right ventricular wall thickness. Right ventricular systolic function is  normal. Left Atrium: Left atrial size was normal in size. Right Atrium: Right atrial size was normal in size. Pericardium: There is no evidence of pericardial effusion. Mitral Valve: The mitral valve is normal in structure. Normal mobility of the mitral valve leaflets. No evidence of mitral valve regurgitation. No evidence of mitral valve stenosis. Tricuspid Valve: The tricuspid valve is normal in structure. Tricuspid valve regurgitation is not demonstrated. No evidence of tricuspid stenosis.  Aortic Valve: Calcified non coronary cusp. The aortic valve was not well visualized. Aortic valve regurgitation is not visualized. Mild to moderate aortic valve sclerosis/calcification is present, without any evidence of aortic stenosis. Pulmonic Valve: The pulmonic valve was normal in structure. Pulmonic valve regurgitation is not visualized. No evidence of pulmonic stenosis. Aorta: The aortic root is normal in size and structure. Venous: The inferior vena cava is normal in size with greater than 50% respiratory variability, suggesting right atrial pressure of 3 mmHg. IAS/Shunts: No atrial level shunt detected by color flow Doppler.  LEFT VENTRICLE PLAX 2D LVIDd:         5.69 cm  Diastology LVIDs:         3.14 cm  LV e' lateral:   10.70 cm/s LV PW:         0.87 cm  LV E/e' lateral: 5.9 LV IVS:        0.83 cm  LV e' medial:    7.29 cm/s LVOT diam:     2.20 cm  LV E/e' medial:  8.7 LV SV:         58 LV SV Index:   24 LVOT Area:     3.80 cm  RIGHT VENTRICLE RV S prime:     12.70 cm/s LEFT ATRIUM             Index LA diam:        3.70 cm 1.51 cm/m LA Vol (A2C):   32.6 ml 13.31 ml/m LA Vol (A4C):   60.7 ml 24.78 ml/m LA Biplane Vol: 45.5 ml 18.57 ml/m  AORTIC VALVE LVOT Vmax:   64.60 cm/s LVOT Vmean:  46.100 cm/s LVOT VTI:    0.152 m  AORTA Ao Root diam: 2.60 cm MITRAL VALVE MV Area (PHT): 2.11 cm    SHUNTS MV Decel Time: 359 msec    Systemic VTI:  0.15 m MV E velocity: 63.40 cm/s  Systemic Diam: 2.20 cm MV A velocity: 30.40  cm/s MV E/A ratio:  2.09 Jenkins Rouge MD Electronically signed by Jenkins Rouge MD Signature Date/Time: 03/24/2020/11:55:05 AM    Final     12-lead ECG on admission showed NSR 80 bpm, normal PR and QRS (personally reviewed) All prior EKG's in EPIC reviewed with no documented atrial fibrillation  Telemetry NSR 60-80s (personally reviewed)  Assessment and Plan:  1. Cryptogenic stroke The patient presents with cryptogenic stroke.  The patient does not have a TEE planned for this AM.  I spoke at length with the patient about monitoring for afib with an implantable loop recorder.  Risks, benefits, and alteratives to implantable loop recorder were discussed with the patient today.   At this time, the patient is very clear in their decision to proceed with implantable loop recorder.   Wound care was reviewed with the patient (keep incision clean and dry for 3 days).  Wound check scheduled and entered in AVS. Please call with questions.   Shirley Friar, PA-C 03/25/2020 12:14 PM I have seen, examined the patient, and reviewed the above assessment and plan.  Changes to above are made where necessary.  On exam, RRR.  The patient presents with neurologic and is found to have acute stroke.  I agree with Dr Leonie Man that there is concern for an arrhythmia as the cause.  I would therefore advise long term monitoring to evaluate for afib as the cause for his stroke.  Risks and benefits to long term monitoring with an ILR were discussed with the  patient today.  He understands risks include but are not limited to bleeding and infection and wishes to proceed at this time.  Co Sign: Thompson Grayer, MD 03/25/2020 2:58 PM

## 2020-03-25 NOTE — Discharge Instructions (Signed)

## 2020-03-26 ENCOUNTER — Encounter (HOSPITAL_COMMUNITY): Payer: Self-pay | Admitting: Internal Medicine

## 2020-03-29 DIAGNOSIS — G4734 Idiopathic sleep related nonobstructive alveolar hypoventilation: Secondary | ICD-10-CM | POA: Diagnosis not present

## 2020-03-30 ENCOUNTER — Telehealth: Payer: Self-pay | Admitting: Primary Care

## 2020-03-30 NOTE — Telephone Encounter (Signed)
My recommendation is that he not discontinue his oxygen use.  His SPO2 low was 51%.  If he wants to try to get off oxygen I recommend he have an in lab split-night sleep study as his low oxygen levels could be related to obstructive sleep apnea.  There is a chance that by correcting his sleep apnea he may be able to have oxygen discontinued at that time.

## 2020-03-30 NOTE — Telephone Encounter (Signed)
03/25/2020 ONO on room air showed patient spent 54 minutes with an O2 level less than 88%.  This qualifies him for oxygen if he is not already on it please order 2 L oxygen for patient to wear at night due to nocturnal hypoxemia.

## 2020-03-30 NOTE — Telephone Encounter (Signed)
Called and spoke with pt who is requesting to know the results of his ONO which he stated was performed about a week ago. Beth, please advise.

## 2020-03-30 NOTE — Telephone Encounter (Signed)
Called and spoke with pt letting him know the results of the ONO per [facility]. Pt verbalized understanding and then stated that he does have O2 but he was hoping that he could be able to get rid of it. Pt stated if he really needs to keep the O2 that he will wear it but if he could get rid of it, he would like that. Beth, please advise on info stated by pt as he was really hoping that he could be able to get rid of the O2 at night.

## 2020-03-30 NOTE — Telephone Encounter (Signed)
Called and spoke with pt letting him know the info stated by [facility] and he verbalized understanding. Scheduled pt a f/u with [name]. Nothing further needed.

## 2020-04-06 DIAGNOSIS — I639 Cerebral infarction, unspecified: Secondary | ICD-10-CM | POA: Diagnosis not present

## 2020-04-06 DIAGNOSIS — G629 Polyneuropathy, unspecified: Secondary | ICD-10-CM | POA: Diagnosis not present

## 2020-04-06 DIAGNOSIS — M25511 Pain in right shoulder: Secondary | ICD-10-CM | POA: Diagnosis not present

## 2020-04-06 DIAGNOSIS — M48061 Spinal stenosis, lumbar region without neurogenic claudication: Secondary | ICD-10-CM | POA: Diagnosis not present

## 2020-04-06 DIAGNOSIS — R5381 Other malaise: Secondary | ICD-10-CM | POA: Diagnosis not present

## 2020-04-06 DIAGNOSIS — J8489 Other specified interstitial pulmonary diseases: Secondary | ICD-10-CM | POA: Diagnosis not present

## 2020-04-06 DIAGNOSIS — I10 Essential (primary) hypertension: Secondary | ICD-10-CM | POA: Diagnosis not present

## 2020-04-06 DIAGNOSIS — Z4789 Encounter for other orthopedic aftercare: Secondary | ICD-10-CM | POA: Diagnosis not present

## 2020-04-06 DIAGNOSIS — R609 Edema, unspecified: Secondary | ICD-10-CM | POA: Diagnosis not present

## 2020-04-08 ENCOUNTER — Telehealth: Payer: Self-pay

## 2020-04-08 ENCOUNTER — Ambulatory Visit (INDEPENDENT_AMBULATORY_CARE_PROVIDER_SITE_OTHER): Payer: Medicare Other | Admitting: Emergency Medicine

## 2020-04-08 ENCOUNTER — Other Ambulatory Visit: Payer: Self-pay

## 2020-04-08 DIAGNOSIS — I639 Cerebral infarction, unspecified: Secondary | ICD-10-CM

## 2020-04-08 LAB — CUP PACEART INCLINIC DEVICE CHECK
Date Time Interrogation Session: 20210722091102
Implantable Pulse Generator Implant Date: 20210708

## 2020-04-08 NOTE — Progress Notes (Addendum)
ILR wound check in clinic. Steri strips removed prior to office visit. Wound well healed. Home monitor transmitting nightly. Battery status: good. R-waves 0.84mV. 0 symptom episodes, 0 tachy episodes, 0 pause episodes, 0 brady episodes. 10 AF episodes (0.1% burden). Monthly summary reports and ROV with Dr. Rayann Heman . Questions answered.  AF episodes reviewed with Joseph Art, PA-C, agree that episodes appear SR w/ PAC's and noise. Will route to Dr. Rayann Heman. Presenting today was VS 56 SR. See phone note 04/08/20 for images of episodes.

## 2020-04-08 NOTE — Telephone Encounter (Addendum)
Patient seen in device clinic for LINQ wound /devic check 04/08/20. During interrogation noted 10 episodes of AF.? Currently not on any OAC's. Reviewed episodes with Renee, PA-C and agrees that episodes appear SR w/ PAC's and artifact. Patients home remote was not connecting properly. Data sometime last night.  Routing to Dr. Rayann Heman for review and any recommendations.

## 2020-04-09 DIAGNOSIS — M25511 Pain in right shoulder: Secondary | ICD-10-CM | POA: Diagnosis not present

## 2020-04-09 DIAGNOSIS — Z4789 Encounter for other orthopedic aftercare: Secondary | ICD-10-CM | POA: Diagnosis not present

## 2020-04-11 NOTE — Telephone Encounter (Addendum)
Sinus with PACs noted  Continue to monitor. Ok to adjust episode duration to 6 minutes to reduce false positive episodes.

## 2020-04-12 NOTE — Telephone Encounter (Signed)
Carelink updated for notification only on episodes >53minutes.  AET, RN

## 2020-04-14 ENCOUNTER — Ambulatory Visit: Payer: Medicare Other | Admitting: Cardiology

## 2020-04-17 NOTE — Progress Notes (Signed)
HPI: Follow-up thoracic aortic aneurysm and coronary artery disease. Chest CT January 2021 suggest UIP stable, small pulmonary nodules, dilated aortic root measuring 4 cm, aortic atherosclerosis and three-vessel coronary disease.  Patient is followed by pulmonary.  His parenchymal changes seen on CT are felt possibly sequela of previous COVID-19 infection. Patient has had difficulties with dyspnea since Covid.  Follow-up nuclear study February 2021 showed ejection fraction 66% and no ischemia or infarction.  Had CVA July 2021.  Echocardiogram July 2021 showed normal LV function, negative microcavitation study.  Had implantable loop recorder placed at that time. Since last seen, he denies increased dyspnea, exertional chest pain or syncope.  Current Outpatient Medications  Medication Sig Dispense Refill  . acetaminophen (TYLENOL) 500 MG tablet Take 1,000 mg by mouth every 6 (six) hours as needed for mild pain or moderate pain.    Marland Kitchen levothyroxine (SYNTHROID) 75 MCG tablet Take 75 mcg by mouth daily.     Marland Kitchen losartan-hydrochlorothiazide (HYZAAR) 50-12.5 MG tablet Take 0.5 tablets by mouth daily.    . Omega-3 Fatty Acids (FISH OIL) 1000 MG CAPS Take 1,000 mg by mouth daily.     . rosuvastatin (CRESTOR) 20 MG tablet Take 1 tablet (20 mg total) by mouth daily. 90 tablet 1  . omeprazole (PRILOSEC) 40 MG capsule Take 40 mg by mouth daily. (Patient not taking: Reported on 04/21/2020)     No current facility-administered medications for this visit.     Past Medical History:  Diagnosis Date  . Arthritis   . Barrett's esophagus   . Bowel trouble    Stool leakage since colonscopy 02/2016  . Cancer ([employer])    skin cancer  . Carpal tunnel syndrome, bilateral   . Depression    in teenage years   . GERD (gastroesophageal reflux disease)   . History of COVID-19   . Hyperlipemia   . Hypertension   . Hypothyroidism   . Interstitial lung disease ([place])   . Peripheral neuropathy   . Pneumonia   .  Shortness of breath dyspnea    with bending over   . 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 TIA (transient ischemic attack) 03/2020  . Wears glasses     Past Surgical History:  Procedure Laterality Date  . APPENDECTOMY    . CARPAL TUNNEL RELEASE  04/23/2012   Procedure: CARPAL TUNNEL RELEASE;  Surgeon: Cammie Sickle., MD;  Location: [place];  Service: Orthopedics;  Laterality: Left;  . CARPAL TUNNEL RELEASE  06/27/2012   Procedure: CARPAL TUNNEL RELEASE;  Surgeon: Cammie Sickle., MD;  Location: [place];  Service: Orthopedics;  Laterality: Right;  . CERVICAL DISCECTOMY  2007  . colonscopy     polyps removed / benign  . HERNIA REPAIR    . INCISIONAL HERNIA REPAIR  2011   subcostal from append  . JOINT REPLACEMENT  81,87   lt total hip  . LAPAROSCOPIC APPENDECTOMY  2010   required open repair  . LOOP RECORDER INSERTION N/A 03/25/2020   Procedure: LOOP RECORDER INSERTION;  Surgeon: Thompson Grayer, MD;  Location: [place] [facility];  Service: Cardiovascular;  Laterality: N/A;  . SHOULDER ARTHROSCOPY WITH ROTATOR CUFF REPAIR Right 02/12/2020   Procedure: SHOULDER ARTHROSCOPY WITH ROTATOR CUFF REPAIR with subacromial decompression distal clavicle resection biceps tenotomy labral debridement;  Surgeon: Sydnee Cabal, MD;  [facility];  Service: Orthopedics;  Laterality: Right;  interscalene block  . TONSILLECTOMY    .  TOTAL HIP ARTHROPLASTY     x3 left hip [phone]  . TOTAL HIP ARTHROPLASTY Right 04/05/2016   Procedure: RIGHT TOTAL HIP ARTHROPLASTY ANTERIOR APPROACH;  Surgeon: Gaynelle Arabian, MD;  [facility];  Service: Orthopedics;  Laterality: Right;    Social History   Socioeconomic History  . Marital status: Married    Spouse name: [name]  . Number of children: 3  . Years of education: Not on file  . Highest education level: Not on file  Occupational History  . Occupation: IT sales professional: [employer]  Tobacco Use  . Smoking status: Never Smoker  . Smokeless tobacco: Never Used  Vaping Use  . Vaping Use: Never used  Substance and Sexual Activity  . Alcohol use: Yes    Comment: ocassionally  . Drug use: No  . Sexual activity: Not on file  Other Topics Concern  . Not on file  Social History Narrative   Lives with wife   Social Determinants of Health   Financial Resource Strain:   . Difficulty of Paying Living Expenses:   Food Insecurity:   . Worried About Charity fundraiser in the Last Year:   . Arboriculturist in the Last Year:   Transportation Needs:   . Film/video editor (Medical):   Marland Kitchen Lack of Transportation (Non-Medical):   Physical Activity:   . Days of Exercise per Week:   . Minutes of Exercise per Session:   Stress:   . Feeling of Stress :   Social Connections:   . Frequency of Communication with Friends and Family:   . Frequency of Social Gatherings with Friends and Family:   . Attends Religious Services:   . Active Member of Clubs or Organizations:   . Attends Archivist Meetings:   Marland Kitchen Marital Status:   Intimate Partner Violence:   . Fear of Current or Ex-Partner:   . Emotionally Abused:   Marland Kitchen Physically Abused:   . Sexually Abused:     Family History  Problem Relation Age of Onset  . Breast cancer Mother   . Hypertension Mother   . Heart disease Father   . Lung cancer Father   . Hypertension Father   . Breast cancer Sister   . Stroke Maternal Grandmother     ROS: Back pain but no fevers or chills, productive cough, hemoptysis, dysphasia, odynophagia, melena, hematochezia, dysuria, hematuria, rash, seizure activity, orthopnea, PND, pedal edema, claudication. Remaining systems are negative.  Physical Exam: Well-developed well-nourished in no acute distress.  Skin is warm and dry.  HEENT is normal.  Neck is supple.  Chest is clear to auscultation with normal expansion.  Cardiovascular exam is regular rate and rhythm.    Abdominal exam nontender or distended. No masses palpated. Extremities show no edema. neuro grossly intact  A/P  1 coronary artery disease-diagnosis based on CT showing coronary calcification.  Nuclear study showed no ischemia.  Plan medical therapy.  Continue aspirin and statin.  2 dyspnea-echocardiogram shows preserved LV function and nuclear study showed no ischemia.  Dyspnea is felt secondary to previous Covid infection with postinflammatory lung disease.  3 hyperlipidemia-continue statin.  4 hypertension-blood pressure controlled.  Continue present medications.  5 dilated aortic root-plan follow-up CTA January 2022.  6 CVA-implantable loop device in place.  7 preoperative evaluation-he may require back surgery.  He is not having exertional chest pain and recent nuclear study showed no ischemia.  He may proceed without further  cardiac evaluation.  Kirk Ruths, MD

## 2020-04-19 DIAGNOSIS — Z4789 Encounter for other orthopedic aftercare: Secondary | ICD-10-CM | POA: Diagnosis not present

## 2020-04-19 DIAGNOSIS — M5416 Radiculopathy, lumbar region: Secondary | ICD-10-CM | POA: Diagnosis not present

## 2020-04-19 DIAGNOSIS — M48062 Spinal stenosis, lumbar region with neurogenic claudication: Secondary | ICD-10-CM | POA: Diagnosis not present

## 2020-04-19 DIAGNOSIS — M25511 Pain in right shoulder: Secondary | ICD-10-CM | POA: Diagnosis not present

## 2020-04-20 ENCOUNTER — Encounter: Payer: Self-pay | Admitting: *Deleted

## 2020-04-21 ENCOUNTER — Ambulatory Visit (INDEPENDENT_AMBULATORY_CARE_PROVIDER_SITE_OTHER): Payer: Medicare Other | Admitting: Diagnostic Neuroimaging

## 2020-04-21 ENCOUNTER — Encounter: Payer: Self-pay | Admitting: Diagnostic Neuroimaging

## 2020-04-21 VITALS — BP 124/85 | HR 56 | Ht 73.0 in | Wt 260.6 lb

## 2020-04-21 DIAGNOSIS — R202 Paresthesia of skin: Secondary | ICD-10-CM | POA: Diagnosis not present

## 2020-04-21 DIAGNOSIS — I639 Cerebral infarction, unspecified: Secondary | ICD-10-CM | POA: Diagnosis not present

## 2020-04-21 DIAGNOSIS — M48062 Spinal stenosis, lumbar region with neurogenic claudication: Secondary | ICD-10-CM | POA: Diagnosis not present

## 2020-04-21 NOTE — Progress Notes (Addendum)
[facility]  PATIENT: Colin [name] DOB: 1951/05/21  REFERRING CLINICIAN: Antony Contras, MD HISTORY FROM: patient  REASON FOR VISIT: new consult    HISTORICAL  CHIEF COMPLAINT:  Chief Complaint  Patient presents with  . Peripheral Neuropathy    rm 7 New Pt "neuropathy in feet, particularly right with foot drop since Feb; stenosis in lumbar spine"    HISTORY OF PRESENT ILLNESS:   69 year old male here for evaluation of low back pain, lower extremity pain, lower extremity weakness.  Symptoms started in February 2021.  He felt back pain, bilateral hip pain, radiating to his legs and feet.  Patient has had bilateral hip replacements in the past.  He went to orthopedic clinic for evaluation.  He had evaluation with MRI of the lumbar spine which shows moderate to severe spinal stenosis at L3-4 and moderate spinal stenosis at L4-5.  He is planning to see another spine specialist to see if this needs to be treated surgically.  In meantime patient went to hospital in July 2021 for transient left hand numbness and weakness.  MRI demonstrated punctate DWI hyperintense infarct in the right frontal cortical region.  Patient was enrolled in BMS research clinical trial.   REVIEW OF SYSTEMS: Full 14 system review of systems performed and negative with exception of: As per HPI.  ALLERGIES: No Known Allergies  HOME MEDICATIONS: Outpatient Medications Prior to Visit  Medication Sig Dispense Refill  . acetaminophen (TYLENOL) 500 MG tablet Take 1,000 mg by mouth every 6 (six) hours as needed for mild pain or moderate pain.    Marland Kitchen levothyroxine (SYNTHROID) 75 MCG tablet Take 75 mcg by mouth daily.     Marland Kitchen losartan-hydrochlorothiazide (HYZAAR) 50-12.5 MG tablet Take 0.5 tablets by mouth daily.    . Omega-3 Fatty Acids (FISH OIL) 1000 MG CAPS Take 1,000 mg by mouth daily.     . rosuvastatin (CRESTOR) 20 MG tablet Take 1 tablet (20 mg total) by mouth daily. 90 tablet 1  . losartan  (COZAAR) 25 MG tablet Take 25 mg by mouth daily. Dose unknown    . albuterol (VENTOLIN HFA) 108 (90 Base) MCG/ACT inhaler Inhale 2 puffs into the lungs every 6 (six) hours as needed for wheezing or shortness of breath. (Patient not taking: Reported on 04/21/2020) 6.7 g 6  . ibuprofen (ADVIL) 200 MG tablet Take 400 mg by mouth every 6 (six) hours as needed for moderate pain.  (Patient not taking: Reported on 04/21/2020)    . omeprazole (PRILOSEC) 40 MG capsule Take 40 mg by mouth daily. (Patient not taking: Reported on 04/21/2020)     No facility-administered medications prior to visit.    PAST MEDICAL HISTORY: Past Medical History:  Diagnosis Date  . Arthritis   . Barrett's esophagus   . Bowel trouble    Stool leakage since colonscopy 02/2016  . Cancer ([place])    skin cancer  . Carpal tunnel syndrome, bilateral   . Depression    in teenage years   . GERD (gastroesophageal reflux disease)   . History of COVID-19   . Hyperlipemia   . Hypertension   . Hypothyroidism   . Interstitial lung disease ([place])   . Peripheral neuropathy   . Pneumonia   . Shortness of breath dyspnea    with bending over   . Stroke [facility]) 03/2020   cryptogenic  . TIA (transient ischemic attack) 03/2020  . Wears glasses     PAST SURGICAL HISTORY: Past Surgical History:  Procedure  Laterality Date  . APPENDECTOMY    . CARPAL TUNNEL RELEASE  04/23/2012   Procedure: CARPAL TUNNEL RELEASE;  Surgeon: Cammie Sickle., MD;  Location: [place];  Service: Orthopedics;  Laterality: Left;  . CARPAL TUNNEL RELEASE  06/27/2012   Procedure: CARPAL TUNNEL RELEASE;  Surgeon: Cammie Sickle., MD;  Location: [place];  Service: Orthopedics;  Laterality: Right;  . CERVICAL DISCECTOMY  2007  . colonscopy     polyps removed / benign  . HERNIA REPAIR    . INCISIONAL HERNIA REPAIR  2011   subcostal from append  . JOINT REPLACEMENT  81,87   lt total hip  . LAPAROSCOPIC APPENDECTOMY  2010    required open repair  . LOOP RECORDER INSERTION N/A 03/25/2020   Procedure: LOOP RECORDER INSERTION;  Surgeon: Thompson Grayer, MD;  Location: [place] [facility];  Service: Cardiovascular;  Laterality: N/A;  . SHOULDER ARTHROSCOPY WITH ROTATOR CUFF REPAIR Right 02/12/2020   Procedure: SHOULDER ARTHROSCOPY WITH ROTATOR CUFF REPAIR with subacromial decompression distal clavicle resection biceps tenotomy labral debridement;  Surgeon: Sydnee Cabal, MD;  [facility];  Service: Orthopedics;  Laterality: Right;  interscalene block  . TONSILLECTOMY    . TOTAL HIP ARTHROPLASTY     x3 left hip [phone]  . TOTAL HIP ARTHROPLASTY Right 04/05/2016   Procedure: RIGHT TOTAL HIP ARTHROPLASTY ANTERIOR APPROACH;  Surgeon: Gaynelle Arabian, MD;  [facility];  Service: Orthopedics;  Laterality: Right;    FAMILY HISTORY: Family History  Problem Relation Age of Onset  . Breast cancer Mother   . Hypertension Mother   . Heart disease Father   . Lung cancer Father   . Hypertension Father   . Breast cancer Sister   . Stroke Maternal Grandmother     SOCIAL HISTORY: Social History   Socioeconomic History  . Marital status: Married    Spouse name: [name]  . Number of children: 3  . Years of education: Not on file  . Highest education level: Not on file  Occupational History  . Occupation: IT sales professional: [employer]  Tobacco Use  . Smoking status: Never Smoker  . Smokeless tobacco: Never Used  Vaping Use  . Vaping Use: Never used  Substance and Sexual Activity  . Alcohol use: Yes    Comment: ocassionally  . Drug use: No  . Sexual activity: Not on file  Other Topics Concern  . Not on file  Social History Narrative   Lives with wife   Social Determinants of Health   Financial Resource Strain:   . Difficulty of Paying Living Expenses:   Food Insecurity:   . Worried About Charity fundraiser in the Last Year:   . Arboriculturist in the Last Year:    Transportation Needs:   . Film/video editor (Medical):   Marland Kitchen Lack of Transportation (Non-Medical):   Physical Activity:   . Days of Exercise per Week:   . Minutes of Exercise per Session:   Stress:   . Feeling of Stress :   Social Connections:   . Frequency of Communication with Friends and Family:   . Frequency of Social Gatherings with Friends and Family:   . Attends Religious Services:   . Active Member of Clubs or Organizations:   . Attends Archivist Meetings:   Marland Kitchen Marital Status:   Intimate Partner Violence:   . Fear of Current or Ex-Partner:   .  Emotionally Abused:   Marland Kitchen Physically Abused:   . Sexually Abused:      PHYSICAL EXAM  GENERAL EXAM/CONSTITUTIONAL: Vitals:  Vitals:   04/21/20 0933  BP: 124/85  Pulse: (!) 56  Weight: 260 lb 9.6 oz (118.2 kg)  Height: 6\' 1"  (1.854 m)     Body mass index is 34.38 kg/m. Wt Readings from Last 3 Encounters:  04/21/20 260 lb 9.6 oz (118.2 kg)  03/25/20 277 lb 5.4 oz (125.8 kg)  03/15/20 271 lb 6.4 oz (123.1 kg)     Patient is in no distress; well developed, nourished and groomed; neck is supple  CARDIOVASCULAR:  Examination of carotid arteries is normal; no carotid bruits  Regular rate and rhythm, no murmurs  Examination of peripheral vascular system by observation and palpation is normal  EYES:  Ophthalmoscopic exam of optic discs and posterior segments is normal; no papilledema or hemorrhages  No exam data present  MUSCULOSKELETAL:  Gait, strength, tone, movements noted in Neurologic exam below  NEUROLOGIC: MENTAL STATUS:  No flowsheet data found.  awake, alert, oriented to person, place and time  recent and remote memory intact  normal attention and concentration  language fluent, comprehension intact, naming intact  fund of knowledge appropriate  CRANIAL NERVE:   2nd - no papilledema on fundoscopic exam  2nd, 3rd, 4th, 6th - pupils equal and reactive to light, visual fields  full to confrontation, extraocular muscles intact, no nystagmus  5th - facial sensation symmetric  7th - facial strength symmetric  8th - hearing intact  9th - palate elevates symmetrically, uvula midline  11th - shoulder shrug symmetric  12th - tongue protrusion midline  MOTOR:   normal bulk and tone  RUE PROX LIMITED BY ROTATOR CUFF PAIN / WEAKNESS; DISTAL 5/5  LUE 5/5  RLE (3-4 PROX; KNEE EXT /FLEX 5, DF 2-3)  LLE (3-4 PROX; KNEE EXT / FLEX 5; DF 4)  SENSORY:   normal and symmetric to light touch; DECR AT KNEES / ANKLES / TOES  COORDINATION:   finger-nose-finger, fine finger movements normal; LIMITED ON RIGHT DUE TO RIGHT SHOULDER PAIN  REFLEXES:   deep tendon reflexes --> BUE 1; LEFT KNEE 2; RIGHT KNEE 0; ANKLES 0  GAIT/STATION:   USING CANE; ANTALGIC     DIAGNOSTIC DATA (LABS, IMAGING, TESTING) - I reviewed patient records, labs, notes, testing and imaging myself where available.  Lab Results  Component Value Date   WBC 5.6 03/25/2020   HGB 12.2 (L) 03/25/2020   HCT 37.9 (L) 03/25/2020   MCV 93.1 03/25/2020   PLT 182 03/25/2020      Component Value Date/Time   NA 139 03/25/2020 0309   K 3.9 03/25/2020 0309   CL 105 03/25/2020 0309   CO2 24 03/25/2020 0309   GLUCOSE 116 (H) 03/25/2020 0309   BUN 17 03/25/2020 0309   CREATININE 0.99 03/25/2020 0309   CALCIUM 9.6 03/25/2020 0309   PROT 6.1 (L) 03/25/2020 0309   ALBUMIN 3.2 (L) 03/25/2020 0309   AST 36 03/25/2020 0309   ALT 27 03/25/2020 0309   ALKPHOS 37 (L) 03/25/2020 0309   BILITOT 0.8 03/25/2020 0309   GFRNONAA >60 03/25/2020 0309   GFRAA >60 03/25/2020 0309   Lab Results  Component Value Date   CHOL 139 03/24/2020   HDL 34 (L) 03/24/2020   LDLCALC 50 03/24/2020   TRIG 277 (H) 03/24/2020   CHOLHDL 4.1 03/24/2020   Lab Results  Component Value Date   HGBA1C 5.8 (H)  03/24/2020   No results found for: VITAMINB12 Lab Results  Component Value Date   TSH 3.342 03/24/2020     03/16/2020 MRI lumbar spine - L3-4 moderate to severe spinal stenosis and mild by foraminal stenosis - L4-5 moderate spinal stenosis and mild bilateral foraminal stenosis - Mild spinal stenosis at L1-2, L2-3, L5-S1 - Multilevel degenerative changes  03/24/20 MRI brain [I reviewed images myself and agree with interpretation. -VRP]  - Punctate acute infarct of the right precentral gyrus also present on the prior study.  03/23/20 CTA head / neck  1. No emergent large vessel occlusion or high-grade stenosis of the intracranial arteries. 2. Bilateral proximal internal carotid artery atherosclerosis without hemodynamically significant stenosis by NASCET criteria. 3. Moderate bilateral vertebral artery V4 segment atherosclerotic calcification with mild-to-moderate stenosis bilaterally. 4. Aortic Atherosclerosis (ICD10-I70.0).   ASSESSMENT AND PLAN  69 y.o. year old male here with back pain, radiating to the legs, worse with standing and walking, associated with numbness and weakness, decreased reflexes, consistent with lumbar spinal stenosis as confirmed on MRI.  Agree with spine surgery evaluation.   Dx:  1. Bilateral leg paresthesia   2. Spinal stenosis of lumbar region with neurogenic claudication     PLAN:  BACK PAIN / LEG PAIN / NUMBNESS / WEAKNESS --> LUMBAR SPINAL STENOSIS (L3-4, L4-5) - follow up with spine surgery clinic  STROKE - follow up with Dr. Leonie Man (stroke research clinic)  Return for pending if symptoms worsen or fail to improve, return to PCP.    Penni Bombard, MD 02/21/629, 16:01 AM Certified in Neurology, Neurophysiology and Neuroimaging  [facility] [address] [place], [place] [address] [phone]

## 2020-04-21 NOTE — Patient Instructions (Signed)
BACK PAIN / LEG PAIN / NUMBNESS / WEAKNESS --> LUMBAR SPINAL STENOSIS (L3-4, L4-5) - follow up with spine surgery clinic

## 2020-04-22 ENCOUNTER — Other Ambulatory Visit: Payer: Self-pay

## 2020-04-22 ENCOUNTER — Encounter: Payer: Self-pay | Admitting: Cardiology

## 2020-04-22 ENCOUNTER — Ambulatory Visit (INDEPENDENT_AMBULATORY_CARE_PROVIDER_SITE_OTHER): Payer: Medicare Other | Admitting: Cardiology

## 2020-04-22 VITALS — BP 128/72 | HR 72 | Ht 73.0 in | Wt 263.0 lb

## 2020-04-22 DIAGNOSIS — E7800 Pure hypercholesterolemia, unspecified: Secondary | ICD-10-CM | POA: Diagnosis not present

## 2020-04-22 DIAGNOSIS — I639 Cerebral infarction, unspecified: Secondary | ICD-10-CM | POA: Diagnosis not present

## 2020-04-22 DIAGNOSIS — I712 Thoracic aortic aneurysm, without rupture, unspecified: Secondary | ICD-10-CM

## 2020-04-22 DIAGNOSIS — I10 Essential (primary) hypertension: Secondary | ICD-10-CM | POA: Diagnosis not present

## 2020-04-22 DIAGNOSIS — Z01810 Encounter for preprocedural cardiovascular examination: Secondary | ICD-10-CM | POA: Diagnosis not present

## 2020-04-22 DIAGNOSIS — I2510 Atherosclerotic heart disease of native coronary artery without angina pectoris: Secondary | ICD-10-CM

## 2020-04-22 DIAGNOSIS — Z4789 Encounter for other orthopedic aftercare: Secondary | ICD-10-CM | POA: Diagnosis not present

## 2020-04-22 DIAGNOSIS — M25511 Pain in right shoulder: Secondary | ICD-10-CM | POA: Diagnosis not present

## 2020-04-22 NOTE — Patient Instructions (Signed)
Medication Instructions:  NO CHANGE *If you need a refill on your cardiac medications before your next appointment, please call your pharmacy*   Lab Work: If you have labs (blood work) drawn today and your tests are completely normal, you will receive your results only by: . MyChart Message (if you have MyChart) OR . A paper copy in the mail If you have any lab test that is abnormal or we need to change your treatment, we will call you to review the results.   Follow-Up: At [facility], you and your health needs are our priority.  As part of our continuing mission to provide you with exceptional heart care, we have created designated Provider Care Teams.  These Care Teams include your primary Cardiologist (physician) and Advanced Practice Providers (APPs -  Physician Assistants and Nurse Practitioners) who all work together to provide you with the care you need, when you need it.  We recommend signing up for the patient portal called "MyChart".  Sign up information is provided on this After Visit Summary.  MyChart is used to connect with patients for Virtual Visits (Telemedicine).  Patients are able to view lab/test results, encounter notes, upcoming appointments, etc.  Non-urgent messages can be sent to your provider as well.   To learn more about what you can do with MyChart, go to [email or web address].    Your next appointment:   12 month(s)  The format for your next appointment:   In Person  Provider:   You may see BRIAN CRENSHAW MD or one of the following Advanced Practice Providers on your designated Care Team:    Luke Kilroy, PA-C  Callie Goodrich, PA-C  Jesse Cleaver, FNP     

## 2020-04-26 DIAGNOSIS — M25511 Pain in right shoulder: Secondary | ICD-10-CM | POA: Diagnosis not present

## 2020-04-26 DIAGNOSIS — Z4789 Encounter for other orthopedic aftercare: Secondary | ICD-10-CM | POA: Diagnosis not present

## 2020-04-27 DIAGNOSIS — M545 Low back pain: Secondary | ICD-10-CM | POA: Diagnosis not present

## 2020-04-27 DIAGNOSIS — M21371 Foot drop, right foot: Secondary | ICD-10-CM | POA: Diagnosis not present

## 2020-04-27 DIAGNOSIS — M48062 Spinal stenosis, lumbar region with neurogenic claudication: Secondary | ICD-10-CM | POA: Diagnosis not present

## 2020-04-27 DIAGNOSIS — M5136 Other intervertebral disc degeneration, lumbar region: Secondary | ICD-10-CM | POA: Diagnosis not present

## 2020-04-27 DIAGNOSIS — M419 Scoliosis, unspecified: Secondary | ICD-10-CM | POA: Diagnosis not present

## 2020-04-28 DIAGNOSIS — Z4789 Encounter for other orthopedic aftercare: Secondary | ICD-10-CM | POA: Diagnosis not present

## 2020-04-28 DIAGNOSIS — M25511 Pain in right shoulder: Secondary | ICD-10-CM | POA: Diagnosis not present

## 2020-04-29 ENCOUNTER — Ambulatory Visit (INDEPENDENT_AMBULATORY_CARE_PROVIDER_SITE_OTHER): Payer: Medicare Other | Admitting: Pulmonary Disease

## 2020-04-29 ENCOUNTER — Encounter: Payer: Self-pay | Admitting: Pulmonary Disease

## 2020-04-29 ENCOUNTER — Other Ambulatory Visit: Payer: Self-pay

## 2020-04-29 DIAGNOSIS — J8410 Pulmonary fibrosis, unspecified: Secondary | ICD-10-CM | POA: Diagnosis not present

## 2020-04-29 DIAGNOSIS — G4700 Insomnia, unspecified: Secondary | ICD-10-CM | POA: Diagnosis not present

## 2020-04-29 DIAGNOSIS — J8489 Other specified interstitial pulmonary diseases: Secondary | ICD-10-CM

## 2020-04-29 DIAGNOSIS — J984 Other disorders of lung: Secondary | ICD-10-CM | POA: Diagnosis not present

## 2020-04-29 DIAGNOSIS — G4734 Idiopathic sleep related nonobstructive alveolar hypoventilation: Secondary | ICD-10-CM | POA: Diagnosis not present

## 2020-04-29 NOTE — Progress Notes (Signed)
Virtual Visit via Telephone Note  I connected with Colin [name] on 04/29/20 at  2:15 PM EDT by telephone and verified that I am speaking with the correct person using two identifiers.  Location: Patient: Home Provider: Home office   I discussed the limitations, risks, security and privacy concerns of performing an evaluation and management service by telephone and the availability of in person appointments. I also discussed with the patient that there may be a patient responsible charge related to this service. The patient expressed understanding and agreed to proceed.   I discussed the assessment and treatment plan with the patient. The patient was provided an opportunity to ask questions and all were answered. The patient agreed with the plan and demonstrated an understanding of the instructions.   The patient was advised to call back or seek an in-person evaluation if the symptoms worsen or if the condition fails to improve as anticipated.  I provided 25 minutes of non-face-to-face time during this encounter.   Brigetta Beckstrom Rodman Pickle, MD    Subjective:   PATIENT ID: Colin [name] GENDER: male DOB: 09-Mar-1951, MRN: [id number]   HPI  No chief complaint on file.  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  He has been off steroids since July. He has minimal shortness of breath. He had an overnight oximetry test demonstrating hypoxemia so he has continued to wear his nightly oxygen. Denies difficulty concentrating or memory issues. He reports mobility issues with worsening cervical stenosis and recent TIA. He has fallen and injured his shoulder so has had right rotator cuff surgery this year. Reports he has lost 15-20lbs this summer. He continues to bicycle at home.  Prednisone taper history 11/12 -12/1  Prednisone 50 mg QD 12/1-12/15 Prednisone 40 mg QD 12/15-1/5 Prednisone 30 mg QD 1/5-1/20 Prednisone 20 mg QD 1/20-2/17 Alternate 30 and 20 mg every other day 3/19-4/15 Alternative 20mg  and 10 mg every day 4/15-6/28 5mg  QD 6/28-7/5 Alternate 5 mg every other day STOP  Social History: Never smoker  Past Medical History:  Diagnosis Date  . Arthritis   . Barrett's esophagus   . Bowel trouble    Stool leakage since colonscopy 02/2016  . Cancer ([place])    skin cancer  . Carpal tunnel syndrome, bilateral   . Depression    in teenage years   . GERD (gastroesophageal reflux disease)   . History of COVID-19   . Hyperlipemia   . Hypertension   . Hypothyroidism   . Interstitial lung disease ([place])   . Peripheral neuropathy   . Pneumonia   . Shortness of breath dyspnea    with bending over   . Stroke [facility]) 03/2020   cryptogenic  . TIA (transient ischemic attack) 03/2020  . Wears glasses     No Known Allergies   Outpatient Medications Prior to Visit  Medication Sig Dispense Refill  . acetaminophen (TYLENOL) 500 MG tablet Take 1,000 mg by mouth every 6 (six) hours as needed for mild pain or moderate pain.    Marland Kitchen levothyroxine (SYNTHROID) 75 MCG tablet Take 75 mcg by mouth daily.     Marland Kitchen losartan-hydrochlorothiazide (HYZAAR) 50-12.5 MG tablet Take 0.5 tablets by mouth daily.    . Omega-3 Fatty Acids (FISH OIL) 1000 MG CAPS Take 1,000 mg by mouth daily.     Marland Kitchen  omeprazole (PRILOSEC) 40 MG capsule Take 40 mg by mouth daily. (Patient not taking: Reported on 04/21/2020)    . rosuvastatin (CRESTOR) 20 MG tablet Take 1 tablet (20 mg total) by mouth daily. 90 tablet 1   No facility-administered medications prior to visit.    Review of Systems  Constitutional: Negative for chills, diaphoresis, fever, malaise/fatigue and weight loss.  HENT: Negative for congestion.   Respiratory: Negative for cough, hemoptysis, sputum production, shortness of breath and wheezing.   Cardiovascular: Negative for  chest pain, palpitations and leg swelling.   Objective:   There were no vitals filed for this visit.    Physical Exam: No apparent distress on the phone  Data Reviewed:  Imaging: CXR 09/02/19 - Improvement of basilar infiltrates CT Chest 07/28/19 - Mid-to-lower ground glass opacities with interstitial thickening predominantly in lower lobes concerning for NSIP CXR 06/27/19 - Bibasilar airspace opacities, unchanged. Per radiology, slightly progressed compared to prior CXR 08/14/12 - No evidence of infiltrate, effusion or edema CT Chest 10/01/19 - Slightly improved ground glass opacities. Persistent interstitial reticular thickening without honeycombing that is overall stable. CT Chest 03/01/20 - Improved GGO  PFT: 07/22/19 FVC 3.40 (66%) FEV1 3.03 (79%) Ratio 86  TLC 71% DLCO 56% Interpretation: Mild restrictive defect with moderately reduced gas exchange  12/05/19 6MWT - No desaturations on RA. Completed 476 meters  Imaging, labs and test noted above have been reviewed independently by me.    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 Tapered off of steroids and tolerating well. Continues to have nocturnal oxygen needs. Will evaluate for OSA.  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  You can take albuterol 2 puffs prior to activity or as needed for shortness of breath  Health Maintenance Immunization History  Administered Date(s) Administered  . DTaP 02/15/2010  . Influenza Split 07/24/2012  . Influenza, High Dose Seasonal PF 06/16/2019  . PFIZER SARS-COV-2  Vaccination 11/13/2019, 12/11/2019  . Pneumococcal Conjugate-13 04/30/2018  . Pneumococcal Polysaccharide-23 06/18/2019   No orders of the defined types were placed in this encounter.  No orders of the defined types were placed in this encounter.  Return for after PFTs with me.  [place], MD [facility] 04/29/2020 9:48 PM  Office Number [phone]

## 2020-04-29 NOTE — Patient Instructions (Signed)
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

## 2020-04-30 NOTE — Addendum Note (Signed)
Addended by: Amado Coe on: 04/30/2020 05:21 PM   Modules accepted: Orders

## 2020-05-04 DIAGNOSIS — Z4789 Encounter for other orthopedic aftercare: Secondary | ICD-10-CM | POA: Diagnosis not present

## 2020-05-04 DIAGNOSIS — M25511 Pain in right shoulder: Secondary | ICD-10-CM | POA: Diagnosis not present

## 2020-05-06 ENCOUNTER — Encounter: Payer: Self-pay | Admitting: *Deleted

## 2020-05-06 ENCOUNTER — Telehealth: Payer: Self-pay | Admitting: *Deleted

## 2020-05-06 NOTE — Telephone Encounter (Signed)
Clearance letter faxed to Dr Rolena Infante office, notified patient via my chart.

## 2020-05-06 NOTE — Telephone Encounter (Signed)
Per Dr Clydene Fake message, patient needs a cortisone injection from [facility]. Dr Leonie Man requested letter "to stop study medication for 5 days for cortisone injection with a small but acceptable risk of TIA/stroke if patient is willing. Ideally I like to wait for 3 months but if he is suffering from severe pain may make an exception". Patient is also on ASA. Letter written , on work in MD's desk for review, signature. Dr Leonie Man is working in hospital this week.

## 2020-05-07 DIAGNOSIS — M25511 Pain in right shoulder: Secondary | ICD-10-CM | POA: Diagnosis not present

## 2020-05-07 DIAGNOSIS — Z4789 Encounter for other orthopedic aftercare: Secondary | ICD-10-CM | POA: Diagnosis not present

## 2020-05-11 ENCOUNTER — Ambulatory Visit (INDEPENDENT_AMBULATORY_CARE_PROVIDER_SITE_OTHER): Payer: Medicare Other | Admitting: *Deleted

## 2020-05-11 DIAGNOSIS — J8489 Other specified interstitial pulmonary diseases: Secondary | ICD-10-CM | POA: Diagnosis not present

## 2020-05-11 DIAGNOSIS — M48061 Spinal stenosis, lumbar region without neurogenic claudication: Secondary | ICD-10-CM | POA: Diagnosis not present

## 2020-05-11 DIAGNOSIS — E7800 Pure hypercholesterolemia, unspecified: Secondary | ICD-10-CM | POA: Diagnosis not present

## 2020-05-11 DIAGNOSIS — Z1389 Encounter for screening for other disorder: Secondary | ICD-10-CM | POA: Diagnosis not present

## 2020-05-11 DIAGNOSIS — G629 Polyneuropathy, unspecified: Secondary | ICD-10-CM | POA: Diagnosis not present

## 2020-05-11 DIAGNOSIS — Z0000 Encounter for general adult medical examination without abnormal findings: Secondary | ICD-10-CM | POA: Diagnosis not present

## 2020-05-11 DIAGNOSIS — I639 Cerebral infarction, unspecified: Secondary | ICD-10-CM | POA: Diagnosis not present

## 2020-05-11 DIAGNOSIS — K2270 Barrett's esophagus without dysplasia: Secondary | ICD-10-CM | POA: Diagnosis not present

## 2020-05-11 DIAGNOSIS — R609 Edema, unspecified: Secondary | ICD-10-CM | POA: Diagnosis not present

## 2020-05-11 DIAGNOSIS — I10 Essential (primary) hypertension: Secondary | ICD-10-CM | POA: Diagnosis not present

## 2020-05-11 DIAGNOSIS — E039 Hypothyroidism, unspecified: Secondary | ICD-10-CM | POA: Diagnosis not present

## 2020-05-11 DIAGNOSIS — Z125 Encounter for screening for malignant neoplasm of prostate: Secondary | ICD-10-CM | POA: Diagnosis not present

## 2020-05-11 LAB — CUP PACEART REMOTE DEVICE CHECK
Date Time Interrogation Session: 20210823144830
Implantable Pulse Generator Implant Date: 20210708

## 2020-05-13 DIAGNOSIS — M25511 Pain in right shoulder: Secondary | ICD-10-CM | POA: Diagnosis not present

## 2020-05-13 DIAGNOSIS — Z4789 Encounter for other orthopedic aftercare: Secondary | ICD-10-CM | POA: Diagnosis not present

## 2020-05-14 ENCOUNTER — Telehealth: Payer: Self-pay | Admitting: Neurology

## 2020-05-14 ENCOUNTER — Encounter: Payer: Self-pay | Admitting: Neurology

## 2020-05-14 NOTE — Telephone Encounter (Signed)
Patient called after-hours call center regarding his surgical clearance letter for his procedure tomorrow. He stated that the orthopedics office did not receive the letter. I advised him that I would look into his chart and try to get a copy of the letter sent to him as a message. I called him back after sending him a MyChart message. He also indicated that in the interim the orthopedics office was able to retrieve the letter through his electronic chart, so they were good to go for tomorrow.

## 2020-05-15 DIAGNOSIS — M5136 Other intervertebral disc degeneration, lumbar region: Secondary | ICD-10-CM | POA: Diagnosis not present

## 2020-05-17 NOTE — Progress Notes (Signed)
Carelink Summary Report / Loop Recorder 

## 2020-05-18 DIAGNOSIS — M25511 Pain in right shoulder: Secondary | ICD-10-CM | POA: Diagnosis not present

## 2020-05-18 DIAGNOSIS — M545 Low back pain: Secondary | ICD-10-CM | POA: Diagnosis not present

## 2020-05-19 ENCOUNTER — Encounter: Payer: Self-pay | Admitting: Pulmonary Disease

## 2020-05-19 ENCOUNTER — Ambulatory Visit (INDEPENDENT_AMBULATORY_CARE_PROVIDER_SITE_OTHER): Payer: Medicare Other | Admitting: Pulmonary Disease

## 2020-05-19 ENCOUNTER — Other Ambulatory Visit: Payer: Self-pay

## 2020-05-19 VITALS — BP 106/62 | HR 73 | Temp 97.5°F | Ht 73.0 in | Wt 235.0 lb

## 2020-05-19 DIAGNOSIS — J8410 Pulmonary fibrosis, unspecified: Secondary | ICD-10-CM | POA: Diagnosis not present

## 2020-05-19 DIAGNOSIS — G4734 Idiopathic sleep related nonobstructive alveolar hypoventilation: Secondary | ICD-10-CM

## 2020-05-19 DIAGNOSIS — J8489 Other specified interstitial pulmonary diseases: Secondary | ICD-10-CM | POA: Diagnosis not present

## 2020-05-19 LAB — PULMONARY FUNCTION TEST
DL/VA % pred: 101 %
DL/VA: 4.13 ml/min/mmHg/L
DLCO cor % pred: 81 %
DLCO cor: 23.46 ml/min/mmHg
DLCO unc % pred: 81 %
DLCO unc: 23.46 ml/min/mmHg
FEF 25-75 Post: 5.34 L/sec
FEF 25-75 Pre: 5.84 L/sec
FEF2575-%Change-Post: -8 %
FEF2575-%Pred-Post: 183 %
FEF2575-%Pred-Pre: 200 %
FEV1-%Change-Post: 0 %
FEV1-%Pred-Post: 100 %
FEV1-%Pred-Pre: 100 %
FEV1-Post: 3.78 L
FEV1-Pre: 3.78 L
FEV1FVC-%Change-Post: 0 %
FEV1FVC-%Pred-Pre: 119 %
FEV6-%Change-Post: -1 %
FEV6-%Pred-Post: 87 %
FEV6-%Pred-Pre: 89 %
FEV6-Post: 4.2 L
FEV6-Pre: 4.27 L
FEV6FVC-%Pred-Post: 105 %
FEV6FVC-%Pred-Pre: 105 %
FVC-%Change-Post: 0 %
FVC-%Pred-Post: 83 %
FVC-%Pred-Pre: 84 %
FVC-Post: 4.24 L
FVC-Pre: 4.27 L
Post FEV1/FVC ratio: 89 %
Post FEV6/FVC ratio: 100 %
Pre FEV1/FVC ratio: 89 %
Pre FEV6/FVC Ratio: 100 %
RV % pred: 77 %
RV: 1.97 L
TLC % pred: 82 %
TLC: 6.29 L

## 2020-05-19 NOTE — Progress Notes (Signed)
Full PFT performed today. °

## 2020-05-19 NOTE — Progress Notes (Signed)
Subjective:   PATIENT ID: Colin [name] GENDER: male DOB: 1950/10/09, MRN: [id number]   HPI  Chief Complaint  Patient presents with  . Follow-up    f/u to go over PFT result   Reason for Visit: PFT follow-up  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   No changes since our last telephone visit. Has minimal shortness of breath. He is scheduled for sleep study next week. Compliant with nightly oxygen. Remains as active at home as possible. His cervical stenosis does limit his activity.  Social History: Never smoker  Past Medical History:  Diagnosis Date  . Arthritis   . Barrett's esophagus   . Bowel trouble    Stool leakage since colonscopy 02/2016  . Cancer ([place])    skin cancer  . Carpal tunnel syndrome, bilateral   . Depression    in teenage years   . GERD (gastroesophageal reflux disease)   . History of COVID-19   . Hyperlipemia   . Hypertension   . Hypothyroidism   . Interstitial lung disease ([place])   . Peripheral neuropathy   . Pneumonia   . Shortness of breath dyspnea    with bending over   . Stroke [facility]) 03/2020   cryptogenic  . TIA (transient ischemic attack) 03/2020  . Wears glasses     No Known Allergies   Outpatient Medications Prior to Visit  Medication Sig Dispense Refill  . acetaminophen (TYLENOL) 500 MG tablet Take 1,000 mg by mouth every 6 (six) hours as needed for mild pain or moderate pain.    Marland Kitchen levothyroxine (SYNTHROID) 75 MCG tablet Take 75 mcg by mouth daily.     Marland Kitchen losartan-hydrochlorothiazide (HYZAAR) 50-12.5 MG tablet Take 0.5 tablets by mouth daily.    . Omega-3 Fatty Acids (FISH OIL) 1000 MG CAPS Take 1,000 mg by mouth daily.     . rosuvastatin (CRESTOR) 20 MG tablet Take 1 tablet (20 mg total) by mouth daily. 90 tablet 1  . omeprazole (PRILOSEC) 40 MG capsule Take 40 mg by mouth  daily. (Patient not taking: Reported on 04/21/2020)     No facility-administered medications prior to visit.    Review of Systems  Constitutional: Negative for chills, diaphoresis, fever, malaise/fatigue and weight loss.  HENT: Negative for congestion.   Respiratory: Negative for cough, hemoptysis, sputum production, shortness of breath and wheezing.   Cardiovascular: Negative for chest pain, palpitations and leg swelling.   Objective:   Vitals:   05/19/20 1556  BP: 106/62  Pulse: 73  Temp: (!) 97.5 F (36.4 C)  TempSrc: Other (Comment)  SpO2: 97%  Weight: 235 lb (106.6 kg)  Height: 6\' 1"  (1.854 m)   SpO2: 97 % (room air) O2 Device: None (Room air)  Physical Exam: General: Well-appearing, no acute distress HENT: [place], AT Eyes: EOMI, no scleral icterus Respiratory: Clear to auscultation bilaterally.  No crackles, wheezing or rales Cardiovascular: RRR, -M/R/G, no JVD Extremities:-Edema,-tenderness Neuro: AAO x4, CNII-XII grossly intact Skin: Intact, no rashes or bruising Psych: Normal mood, normal affect  Data Reviewed:  Imaging: CXR 09/02/19 - Improvement of basilar infiltrates CT Chest 07/28/19 - Mid-to-lower ground glass opacities with interstitial thickening predominantly in lower lobes concerning for NSIP CXR 06/27/19 - Bibasilar airspace opacities, unchanged. Per radiology, slightly progressed compared to prior CXR 08/14/12 - No evidence of infiltrate, effusion or  edema CT Chest 10/01/19 - Slightly improved ground glass opacities. Persistent interstitial reticular thickening without honeycombing that is overall stable. CT Chest 03/01/20 - Improved GGO  PFT: 07/22/19 FVC 3.40 (66%) FEV1 3.03 (79%) Ratio 86  TLC 71% DLCO 56% Interpretation: Mild restrictive defect with moderately reduced gas exchange  05/20/20 FVC 4.24 (83%) FEV1 3.78 (100%) Ratio 89  TLC 82% DLCO 81% Interpretation: Normal spirometry  12/05/19 6MWT - No desaturations on RA. Completed 476  meters  Imaging, labs and test noted above have been reviewed independently by me.   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 S/p steroid taper. Continues to have nocturnal oxygen needs. Will evaluate for OSA.  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  You can take albuterol 2 puffs prior to activity or as needed for shortness of breath  Health Maintenance Immunization History  Administered Date(s) Administered  . DTaP 02/15/2010  . Influenza Split 07/24/2012  . Influenza, High Dose Seasonal PF 06/16/2019  . PFIZER SARS-COV-2 Vaccination 11/13/2019, 12/11/2019  . Pneumococcal Conjugate-13 04/30/2018  . Pneumococcal Polysaccharide-23 06/18/2019   No orders of the defined types were placed in this encounter.  No orders of the defined types were placed in this encounter.  Return in about 3 months (around 08/18/2020).  [place], MD [facility] 05/19/2020 9:25 PM  Office Number [phone]

## 2020-05-26 ENCOUNTER — Other Ambulatory Visit: Payer: Self-pay

## 2020-05-26 ENCOUNTER — Ambulatory Visit (HOSPITAL_BASED_OUTPATIENT_CLINIC_OR_DEPARTMENT_OTHER): Payer: Medicare Other | Attending: Pulmonary Disease | Admitting: Internal Medicine

## 2020-05-26 VITALS — Ht 73.0 in | Wt 255.0 lb

## 2020-05-26 DIAGNOSIS — G4700 Insomnia, unspecified: Secondary | ICD-10-CM

## 2020-05-26 DIAGNOSIS — I493 Ventricular premature depolarization: Secondary | ICD-10-CM | POA: Insufficient documentation

## 2020-05-26 DIAGNOSIS — M170 Bilateral primary osteoarthritis of knee: Secondary | ICD-10-CM | POA: Diagnosis not present

## 2020-05-26 DIAGNOSIS — G4734 Idiopathic sleep related nonobstructive alveolar hypoventilation: Secondary | ICD-10-CM

## 2020-05-26 DIAGNOSIS — G4733 Obstructive sleep apnea (adult) (pediatric): Secondary | ICD-10-CM | POA: Diagnosis not present

## 2020-05-27 DIAGNOSIS — M545 Low back pain: Secondary | ICD-10-CM | POA: Diagnosis not present

## 2020-05-28 DIAGNOSIS — M545 Low back pain: Secondary | ICD-10-CM | POA: Diagnosis not present

## 2020-05-29 DIAGNOSIS — G4733 Obstructive sleep apnea (adult) (pediatric): Secondary | ICD-10-CM

## 2020-05-29 NOTE — Telephone Encounter (Signed)
Dr Loanne Drilling,   Patient sent this message for you.    [name], the folks at the sleep study seemed to indicate I definitely have sleep apnea. I look forward to hearing your assessment. Thanks, Colin [name]   Patient completed sleep study 05/28/20.    Message routed to [name] as Juluis Rainier

## 2020-05-31 DIAGNOSIS — M545 Low back pain: Secondary | ICD-10-CM | POA: Diagnosis not present

## 2020-05-31 DIAGNOSIS — M5136 Other intervertebral disc degeneration, lumbar region: Secondary | ICD-10-CM | POA: Diagnosis not present

## 2020-06-01 ENCOUNTER — Other Ambulatory Visit: Payer: Self-pay | Admitting: Neurology

## 2020-06-01 DIAGNOSIS — I6300 Cerebral infarction due to thrombosis of unspecified precerebral artery: Secondary | ICD-10-CM

## 2020-06-03 DIAGNOSIS — M545 Low back pain: Secondary | ICD-10-CM | POA: Diagnosis not present

## 2020-06-03 DIAGNOSIS — M170 Bilateral primary osteoarthritis of knee: Secondary | ICD-10-CM | POA: Diagnosis not present

## 2020-06-06 DIAGNOSIS — G4734 Idiopathic sleep related nonobstructive alveolar hypoventilation: Secondary | ICD-10-CM | POA: Diagnosis not present

## 2020-06-06 NOTE — Procedures (Signed)
Patient Name: Colin [name], Colin [name] Date: 05/26/2020 Gender: Male D.O.B: [birth year] Age (years): 6 Referring Provider: Chi Rodman Pickle Height (inches): 69 Interpreting Physician: Baird Lyons MD, ABSM Weight (lbs): 255 RPSGT: Zadie Rhine BMI: 34 MRN: [id number] Neck Size: 19.00  CLINICAL INFORMATION Sleep Study Type: Split Night CPAP Indication for sleep study: OSA Epworth Sleepiness Score: 5  SLEEP STUDY TECHNIQUE As per the AASM Manual for the Scoring of Sleep and Associated Events v2.3 (April 2016) with a hypopnea requiring 4% desaturations.  The channels recorded and monitored were frontal, central and occipital EEG, electrooculogram (EOG), submentalis EMG (chin), nasal and oral airflow, thoracic and abdominal wall motion, anterior tibialis EMG, snore microphone, electrocardiogram, and pulse oximetry. Continuous positive airway pressure (CPAP) was initiated when the patient met split night criteria and was titrated according to treat sleep-disordered breathing.  MEDICATIONS Medications self-administered by patient taken the night of the study : MELATONIN  RESPIRATORY PARAMETERS Diagnostic  Total AHI (/hr): 72.7 RDI (/hr): 73.2 OA Index (/hr): - CA Index (/hr): 9.3 REM AHI (/hr): 63.0 NREM AHI (/hr): 75.7 Supine AHI (/hr): 78.7 Non-supine AHI (/hr): 41 Min O2 Sat (%): 72.0 Mean O2 (%): 92.2 Time below 88% (min): 16.9   Titration  Optimal Pressure (cm): 16 AHI at Optimal Pressure (/hr): 0.0 Min O2 at Optimal Pressure (%): 95.0 Supine % at Optimal (%): 100 Sleep % at Optimal (%): 100   SLEEP ARCHITECTURE The recording time for the entire night was 371.8 minutes.  During a baseline period of 135.5 minutes, the patient slept for 128.8 minutes in REM and nonREM, yielding a sleep efficiency of 95.0%%. Sleep onset after lights out was 2.7 minutes with a REM latency of 41.0 minutes. The patient spent 3.5%% of the night in stage N1 sleep, 72.8%% in stage N2 sleep, 0.0%% in  stage N3 and 23.7% in REM.  During the titration period of 229.4 minutes, the patient slept for 207.6 minutes in REM and nonREM, yielding a sleep efficiency of 90.5%%. Sleep onset after CPAP initiation was 8.9 minutes with a REM latency of 30.5 minutes. The patient spent 6.5%% of the night in stage N1 sleep, 69.4%% in stage N2 sleep, 0.0%% in stage N3 and 24.1% in REM.  CARDIAC DATA The 2 lead EKG demonstrated sinus rhythm. The mean heart rate was 100.0 beats per minute. Other EKG findings include: PVCs, PACs.  LEG MOVEMENT DATA The total Periodic Limb Movements of Sleep (PLMS) were 0. The PLMS index was 0.0 .  IMPRESSIONS - Severe obstructive sleep apnea occurred during the diagnostic portion of the study (AHI = 72.7/hour). An optimal PAP pressure was selected for this patient ( 16 cm of water) - Mild central sleep apnea occurred during the diagnostic portion of the study (CAI = 9.3/hour). - Oxygen desaturation was noted during the diagnostic portion of the study (Min O2 = 72.0%). Min O2 sat on CPAP 16 was 95%. - No snoring was audible during the diagnostic portion of the study. - EKG findings include PVCs, PACs - Clinically significant periodic limb movements did not occur during sleep.  DIAGNOSIS - Obstructive Sleep Apnea (G47.33)  RECOMMENDATIONS - Trial of CPAP therapy on 16 cm H2O or autopap 10-20., Flex 3. - Patient used a Large size Fisher[employer]Paykel Full Face Mask Simplus mask and heated humidification. - Be careful with alcohol, sedatives and other CNS depressants that may worsen sleep apnea and disrupt normal sleep architecture. - Sleep hygiene should be reviewed to assess factors that may improve sleep quality. -  Weight management and regular exercise should be initiated or continued.  [Electronically signed] 06/06/2020 10:16 AM  Baird Lyons MD, ABSM Diplomate, American Board of Sleep Medicine   NPI: [id number]                          [place]  ELECTRONICALLY SIGNED ON:  06/06/2020, 10:07 AM [place] PH: [phone]   FX: [phone] [place]

## 2020-06-07 DIAGNOSIS — M545 Low back pain: Secondary | ICD-10-CM | POA: Diagnosis not present

## 2020-06-10 DIAGNOSIS — M170 Bilateral primary osteoarthritis of knee: Secondary | ICD-10-CM | POA: Diagnosis not present

## 2020-06-11 DIAGNOSIS — M545 Low back pain: Secondary | ICD-10-CM | POA: Diagnosis not present

## 2020-06-11 NOTE — Telephone Encounter (Signed)
[facility] Telephone Encounter  Contacted patient. Updated patient on sleep test results. Addressed questions and concerns.  Assessment: Severe OSA      Sleep study 05/26/20 - AHI 72.5. Nadir SpO2 72% Mean SpO2 92 .  Plan: Order autoCPAP 5-20 with flex 3. (Patient DME - [facility])  Rodman Pickle, M.D. [facility] 06/11/2020 4:51 PM

## 2020-06-14 ENCOUNTER — Telehealth: Payer: Self-pay

## 2020-06-14 ENCOUNTER — Ambulatory Visit (INDEPENDENT_AMBULATORY_CARE_PROVIDER_SITE_OTHER): Payer: Medicare Other | Admitting: Emergency Medicine

## 2020-06-14 DIAGNOSIS — I639 Cerebral infarction, unspecified: Secondary | ICD-10-CM | POA: Diagnosis not present

## 2020-06-14 DIAGNOSIS — M545 Low back pain: Secondary | ICD-10-CM | POA: Diagnosis not present

## 2020-06-14 LAB — CUP PACEART REMOTE DEVICE CHECK
Date Time Interrogation Session: 20210925144741
Implantable Pulse Generator Implant Date: 20210708

## 2020-06-14 NOTE — Telephone Encounter (Signed)
Order and mychart message placed for patient.  Also put in recall to be seen again in2 months for new CPAP start follow up

## 2020-06-14 NOTE — Telephone Encounter (Signed)
Received notification from front desk that pateint ahd walked in this morning inquiring about his remote transmission that was due.  Spoke with pt, advised transmission received 06/12/20.  Informed pt that transmission does come through automatically he does not need to do anythjing to send it.

## 2020-06-16 DIAGNOSIS — M75121 Complete rotator cuff tear or rupture of right shoulder, not specified as traumatic: Secondary | ICD-10-CM | POA: Diagnosis not present

## 2020-06-16 NOTE — Progress Notes (Signed)
Carelink Summary Report / Loop Recorder 

## 2020-06-21 ENCOUNTER — Ambulatory Visit (HOSPITAL_COMMUNITY)
Admission: RE | Admit: 2020-06-21 | Discharge: 2020-06-21 | Disposition: A | Discharge status: Home / Self Care | Payer: Medicare Other | Source: Ambulatory Visit | Attending: Neurology | Admitting: Neurology

## 2020-06-21 ENCOUNTER — Other Ambulatory Visit: Payer: Self-pay

## 2020-06-21 DIAGNOSIS — I6782 Cerebral ischemia: Secondary | ICD-10-CM | POA: Diagnosis not present

## 2020-06-21 NOTE — Progress Notes (Signed)
Kindly inform the patient that MRI scan of the brain shows no acute abnormality.  Previously seen stroke on the MRI 3 months ago shows expected evolutionary changes.  Nothing to worry about

## 2020-06-22 ENCOUNTER — Telehealth: Payer: Self-pay | Admitting: Emergency Medicine

## 2020-06-22 NOTE — Telephone Encounter (Signed)
-----   Message from Garvin Fila, MD sent at 06/21/2020  5:41 PM EDT ----- Mitchell Heir inform the patient that MRI scan of the brain shows no acute abnormality.  Previously seen stroke on the MRI 3 months ago shows expected evolutionary changes.  Nothing to worry about

## 2020-06-22 NOTE — Telephone Encounter (Signed)
Called and spoke to patient regarding Dr. Clydene Fake review of the MRI of the Brain.  Patient denied any questions and expressed appreciation.

## 2020-06-24 DIAGNOSIS — M545 Low back pain, unspecified: Secondary | ICD-10-CM | POA: Diagnosis not present

## 2020-06-24 NOTE — Telephone Encounter (Signed)
You can do a couple of things, contact DME to see if can rent portable concentrator -FAA compliant (will need to notify airline) or can rent one in town you are going to and have it delivered to hotel.  Let us know if we can help .

## 2020-06-24 NOTE — Telephone Encounter (Signed)
Tammy - please advise as [name] is not available. Thanks!

## 2020-06-29 DIAGNOSIS — M545 Low back pain, unspecified: Secondary | ICD-10-CM | POA: Diagnosis not present

## 2020-07-01 DIAGNOSIS — M545 Low back pain, unspecified: Secondary | ICD-10-CM | POA: Diagnosis not present

## 2020-07-06 DIAGNOSIS — M545 Low back pain, unspecified: Secondary | ICD-10-CM | POA: Diagnosis not present

## 2020-07-07 DIAGNOSIS — H2513 Age-related nuclear cataract, bilateral: Secondary | ICD-10-CM | POA: Diagnosis not present

## 2020-07-08 DIAGNOSIS — M545 Low back pain, unspecified: Secondary | ICD-10-CM | POA: Diagnosis not present

## 2020-07-09 DIAGNOSIS — M48062 Spinal stenosis, lumbar region with neurogenic claudication: Secondary | ICD-10-CM | POA: Diagnosis not present

## 2020-07-12 NOTE — Telephone Encounter (Signed)
Beth- please advise on pt email. I will set him up for appt. Thanks!  Dr. Volanda Napoleon, I am still using oxygen at night, 2L. [facility] said it may be 2-3 months before they can deliver a C-PAP machine. Since my sleep study revealed my Sleep Apnea is severe, am I at any risk with the wait?  Also Medicare said via a letter that they require an office visit with you between 08/27/20 and 11/24/20 to continue with the oxygen. Could we set an appointment so I can notify them of when the appointment is?

## 2020-07-13 NOTE — Telephone Encounter (Signed)
He is ok to wait, just continue to wear oxygen. Recommend sleeping on side. Do not drink alcohol excessively before bedtime and do not take sedating medications. Yes we can set up a visit between that time. We typically recommend 31-90 days after receiving CPAP.

## 2020-07-14 ENCOUNTER — Telehealth: Payer: Self-pay

## 2020-07-14 DIAGNOSIS — M545 Low back pain, unspecified: Secondary | ICD-10-CM | POA: Diagnosis not present

## 2020-07-14 NOTE — Telephone Encounter (Signed)
Frequent false AF alerts received. Patients device could be optimized by programming AF detection to; Less sensitive. Patient called, device clinic apt. Made for 07/29/20 @ 4:00 PM. Location, date and time discussed. Advised to call DC with any questions. Verbalized understanding.

## 2020-07-17 LAB — CUP PACEART REMOTE DEVICE CHECK
Date Time Interrogation Session: 20211028145108
Implantable Pulse Generator Implant Date: 20210708

## 2020-07-19 ENCOUNTER — Ambulatory Visit (INDEPENDENT_AMBULATORY_CARE_PROVIDER_SITE_OTHER): Payer: Medicare Other

## 2020-07-19 DIAGNOSIS — I639 Cerebral infarction, unspecified: Secondary | ICD-10-CM

## 2020-07-20 DIAGNOSIS — M545 Low back pain, unspecified: Secondary | ICD-10-CM | POA: Diagnosis not present

## 2020-07-21 NOTE — Progress Notes (Signed)
Carelink Summary Report / Loop Recorder 

## 2020-07-23 DIAGNOSIS — M545 Low back pain, unspecified: Secondary | ICD-10-CM | POA: Diagnosis not present

## 2020-07-28 NOTE — Telephone Encounter (Signed)
Spoke with pt, changes in programming possible remotely.  Updated Carelink for AF detection to be less sensitive.  Pt advised changes will occur next time he sleeps next to monitor.  Device clinic appt cancelled.

## 2020-07-28 NOTE — Telephone Encounter (Signed)
This encounter was created in error - please disregard.

## 2020-07-29 DIAGNOSIS — M545 Low back pain, unspecified: Secondary | ICD-10-CM | POA: Diagnosis not present

## 2020-08-03 DIAGNOSIS — M545 Low back pain, unspecified: Secondary | ICD-10-CM | POA: Diagnosis not present

## 2020-08-05 ENCOUNTER — Telehealth: Payer: Self-pay | Admitting: Emergency Medicine

## 2020-08-05 NOTE — Telephone Encounter (Signed)
LINQ II  AF alert that appears SR with ectopy. Settings less sensitive, aggressive ectopy rejection. Consider programming less sensitive.

## 2020-08-09 DIAGNOSIS — Z23 Encounter for immunization: Secondary | ICD-10-CM | POA: Diagnosis not present

## 2020-08-18 DIAGNOSIS — Z23 Encounter for immunization: Secondary | ICD-10-CM | POA: Diagnosis not present

## 2020-08-20 LAB — CUP PACEART REMOTE DEVICE CHECK
Date Time Interrogation Session: 20211130145119
Implantable Pulse Generator Implant Date: 20210708

## 2020-08-21 NOTE — Telephone Encounter (Signed)
Ok to change to least sensitive and increase afib detection duration to 10 minutes.

## 2020-08-23 ENCOUNTER — Ambulatory Visit (INDEPENDENT_AMBULATORY_CARE_PROVIDER_SITE_OTHER): Payer: Medicare Other

## 2020-08-23 DIAGNOSIS — I639 Cerebral infarction, unspecified: Secondary | ICD-10-CM | POA: Diagnosis not present

## 2020-08-27 ENCOUNTER — Ambulatory Visit (INDEPENDENT_AMBULATORY_CARE_PROVIDER_SITE_OTHER): Payer: Medicare Other | Admitting: Primary Care

## 2020-08-27 ENCOUNTER — Other Ambulatory Visit: Payer: Self-pay

## 2020-08-27 ENCOUNTER — Encounter: Payer: Self-pay | Admitting: Primary Care

## 2020-08-27 DIAGNOSIS — J8489 Other specified interstitial pulmonary diseases: Secondary | ICD-10-CM

## 2020-08-27 DIAGNOSIS — G4734 Idiopathic sleep related nonobstructive alveolar hypoventilation: Secondary | ICD-10-CM | POA: Diagnosis not present

## 2020-08-27 NOTE — Assessment & Plan Note (Signed)
-   Sleep study in September 2021 showed severe OSA with SpO2 low 74%. Optimal PAP pressure 16cm h20, he did not require oxygen while on CPAP. There is a 2-3 month wait for CPAP machines. He needs to continue oxygen until he is started on CPAP therapy. FU 4-6 weeks after receiving CPAP machine for compliance check - televisit ok.

## 2020-08-27 NOTE — Patient Instructions (Addendum)
Recommendations: Continue to wear oxygen at night until you get CPAP  Please aim to wear CPAP for minimum for 4-6 hours or more Do not drive if experiencing excessive daytime fatigue or somnolence   Orders: Please call [facility] to follow-up on CPAP, see if we can put a rush on order d/t severity  Follow-up: Call office once you get CPAP and make follow-up in 4-6 weeks    CPAP and BPAP Information CPAP and BPAP are methods of helping a person breathe with the use of air pressure. CPAP stands for "continuous positive airway pressure." BPAP stands for "bi-level positive airway pressure." In both methods, air is blown through your nose or mouth and into your air passages to help you breathe well. CPAP and BPAP use different amounts of pressure to blow air. With CPAP, the amount of pressure stays the same while you breathe in and out. With BPAP, the amount of pressure is increased when you breathe in (inhale) so that you can take larger breaths. Your health care provider will recommend whether CPAP or BPAP would be more helpful for you. Why are CPAP and BPAP treatments used? CPAP or BPAP can be helpful if you have:  Sleep apnea.  Chronic obstructive pulmonary disease (COPD).  Heart failure.  Medical conditions that weaken the muscles of the chest including muscular dystrophy, or neurological diseases such as amyotrophic lateral sclerosis (ALS).  Other problems that cause breathing to be weak, abnormal, or difficult. CPAP is most commonly used for obstructive sleep apnea (OSA) to keep the airways from collapsing when the muscles relax during sleep. How is CPAP or BPAP administered? Both CPAP and BPAP are provided by a small machine with a flexible plastic tube that attaches to a plastic mask. You wear the mask. Air is blown through the mask into your nose or mouth. The amount of pressure that is used to blow the air can be adjusted on the machine. Your health care provider will determine the  pressure setting that should be used based on your individual needs. When should CPAP or BPAP be used? In most cases, the mask only needs to be worn during sleep. Generally, the mask needs to be worn throughout the night and during any daytime naps. People with certain medical conditions may also need to wear the mask at other times when they are awake. Follow instructions from your health care provider about when to use the machine. What are some tips for using the mask?   Because the mask needs to be snug, some people feel trapped or closed-in (claustrophobic) when first using the mask. If you feel this way, you may need to get used to the mask. One way to do this is by holding the mask loosely over your nose or mouth and then gradually applying the mask more snugly. You can also gradually increase the amount of time that you use the mask.  Masks are available in various types and sizes. Some fit over your mouth and nose while others fit over just your nose. If your mask does not fit well, talk with your health care provider about getting a different one.  If you are using a mask that fits over your nose and you tend to breathe through your mouth, a chin strap may be applied to help keep your mouth closed.  The CPAP and BPAP machines have alarms that may sound if the mask comes off or develops a leak.  If you have trouble with the mask, it  is very important that you talk with your health care provider about finding a way to make the mask easier to tolerate. Do not stop using the mask. Stopping the use of the mask could have a negative impact on your health. What are some tips for using the machine?  Place your CPAP or BPAP machine on a secure table or stand near an electrical outlet.  Know where the on/off switch is located on the machine.  Follow instructions from your health care provider about how to set the pressure on your machine and when you should use it.  Do not eat or drink while  the CPAP or BPAP machine is on. Food or fluids could get pushed into your lungs by the pressure of the CPAP or BPAP.  Do not smoke. Tobacco smoke residue can damage the machine.  For home use, CPAP and BPAP machines can be rented or purchased through home health care companies. Many different brands of machines are available. Renting a machine before purchasing may help you find out which particular machine works well for you.  Keep the CPAP or BPAP machine and attachments clean. Ask your health care provider for specific instructions. Get help right away if:  You have redness or open areas around your nose or mouth where the mask fits.  You have trouble using the CPAP or BPAP machine.  You cannot tolerate wearing the CPAP or BPAP mask.  You have pain, discomfort, and bloating in your abdomen. Summary  CPAP and BPAP are methods of helping a person breathe with the use of air pressure.  Both CPAP and BPAP are provided by a small machine with a flexible plastic tube that attaches to a plastic mask.  If you have trouble with the mask, it is very important that you talk with your health care provider about finding a way to make the mask easier to tolerate. This information is not intended to replace advice given to you by your health care provider. Make sure you discuss any questions you have with your health care provider. Document Revised: 12/25/2018 Document Reviewed: 07/24/2016 Elsevier Patient Education  [place].

## 2020-08-27 NOTE — Assessment & Plan Note (Addendum)
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 He has has been off oral steriods for the last 5 months without increased shortness of breath.

## 2020-08-27 NOTE — Progress Notes (Signed)
@Patient  ID: Colin [name], male    DOB: 07-10-1951, 69 y.o.   MRN: [id number]  Chief Complaint  Patient presents with  . Follow-up    NSIP, Nocturnal Hypoxia, doing well    Referring provider: Antony Contras, MD  HPI: 69 year old male, never smoke. PMH significant for NSIP, COVID-19, nocturnal hypoxemia, recent cryptogenic stroke, hypertension, GERD and DDD. Patient of [name], last seen on 05/19/20.   Hospitalized with COVID-19 pneumonia in September. Parenchymal changes on CT. Autoimmune work-up negative. He was started on steroids in November 2020, initial improvement with some recurrent shortness of breath with tapper.   Previous [facility] pulmonary encounter: 03/15/2020 Patient presents today for 2 month follow-up/ oxygen check. Hx Covid pneumonia in September 2020. He was started or oral steroids in November. Initially had some improvement but experienced recurrence in shortness of breath with taper. Goal is to come off steroids completely. Prednisone tapered to 5mg  daily and continues Bactrim MWF. Repeat CT chest in June showed stable fibrosis of the lungs compared to January and likely d/t covid-19. No acute process, evidence of CAD and pulmonary HTN. He had an echocardiogram with Dr. Stanford Breed in February which showed normal LV function, mildly elevated pulmonary artery systolic pressure [id number]. He has been inactive d/t hip pain. He had surgery 4 weeks ago on right rotator cuff. He is scheduled for MRI ordered by orthopedics.  He has been seen by Rheumatology in the past for back pain. RF and ANA labs negative in November 2020. He reports back and bilateral hip pain. Neuropathy in both legs, worse in right. He is still wearing 2L oxygen at night, he does not feel like he needs it. Denies snoring or interruptions in his sleep. He has a follow up with Cardiology in late August. He has an apt with Neurology in August as well.   04/29/20- [name]  He has been off steroids since July. He has  minimal shortness of breath. He had an overnight oximetry test demonstrating hypoxemia so he has continued to wear his nightly oxygen. Denies difficulty concentrating or memory issues. He reports mobility issues with worsening cervical stenosis and recent TIA. He has fallen and injured his shoulder so has had right rotator cuff surgery this year. Reports he has lost 15-20lbs this summer. He continues to bicycle at home.  05/19/20- [name] No changes since our last telephone visit. Has minimal shortness of breath. He is scheduled for sleep study next week. Compliant with nightly oxygen. Remains as active at home as possible. His cervical stenosis does limit his activity.  08/27/2020- Interim hx Patient presents today for 3 month follow-up. He had sleep study in September 2021 that showed severe obstructive sleep apnea, AHI 72.7/hr with SpO2 low 72%. Optimal PAP pressure was 16cm h20, he did not require nocturnal oxygen. He is still waiting on CPAP machine from [facility], there is a 2-3 month wait time for machines currently. Patient is requiring oxygen until he can be started CPAP.   Breathing has been fine. He doesn't exert himself much d/t back limitations. He does some exercising in the pool which can cause him to get his breathing up. Denies shortness of breath day to day.    Prednisone taper history 11/12 -12/1 Prednisone 50 mg QD 12/1-12/15 Prednisone 40 mg QD 12/15-1/5 Prednisone 30 mg QD 1/5-1/20 Prednisone 20 mg QD 1/20-2/17 Alternate 30 and 20 mg every other day 3/19-4/15 Alternative 20mg  and 10 mg every day 4/15-6/28 5mg  QD 6/28-7/5 Alternate 5  mg every other day July 2021- STOPPED oral steriods   No Known Allergies  Immunization History  Administered Date(s) Administered  . DTaP 02/15/2010  . Influenza Split 06/14/2009, 08/02/2010, 07/24/2012, 06/20/2013  . Influenza, High Dose Seasonal PF 08/29/2018, 06/16/2019  . PFIZER SARS-COV-2 Vaccination 11/13/2019, 12/11/2019  .  Pneumococcal Conjugate-13 04/30/2018  . Pneumococcal Polysaccharide-23 06/18/2019  . Tdap 02/15/2010, 05/11/2020  . Zoster 12/04/2012    Past Medical History:  Diagnosis Date  . Arthritis   . Barrett's esophagus   . Bowel trouble    Stool leakage since colonscopy 02/2016  . Cancer ([place])    skin cancer  . Carpal tunnel syndrome, bilateral   . Depression    in teenage years   . GERD (gastroesophageal reflux disease)   . History of COVID-19   . Hyperlipemia   . Hypertension   . Hypothyroidism   . Interstitial lung disease ([place])   . Peripheral neuropathy   . Pneumonia   . Shortness of breath dyspnea    with bending over   . Stroke [facility]) 03/2020   cryptogenic  . TIA (transient ischemic attack) 03/2020  . Wears glasses     Tobacco History: Social History   Tobacco Use  Smoking Status Never Smoker  Smokeless Tobacco Never Used   Counseling given: Not Answered   Outpatient Medications Prior to Visit  Medication Sig Dispense Refill  . pantoprazole (PROTONIX) 20 MG tablet Take 20 mg by mouth daily.    . clopidogrel (PLAVIX) 75 MG tablet Take 75 mg by mouth daily.    Marland Kitchen acetaminophen (TYLENOL) 500 MG tablet Take 1,000 mg by mouth every 6 (six) hours as needed for mild pain or moderate pain.    Marland Kitchen clopidogrel (PLAVIX) 75 MG tablet Take 75 mg by mouth daily.    Marland Kitchen levothyroxine (SYNTHROID) 75 MCG tablet Take 75 mcg by mouth daily.     Marland Kitchen losartan-hydrochlorothiazide (HYZAAR) 50-12.5 MG tablet Take 0.5 tablets by mouth daily.    . Omega-3 Fatty Acids (FISH OIL) 1000 MG CAPS Take 1,000 mg by mouth daily.     . rosuvastatin (CRESTOR) 20 MG tablet Take 1 tablet (20 mg total) by mouth daily. 90 tablet 1  . omeprazole (PRILOSEC) 40 MG capsule Take 40 mg by mouth daily. (Patient not taking: Reported on 04/21/2020)     No facility-administered medications prior to visit.     Review of Systems  Review of Systems  Constitutional: Negative.   Respiratory: Negative.   Cardiovascular:  Positive for leg swelling.   Physical Exam  BP 118/72 (BP Location: Left Arm, Cuff Size: Normal)   Pulse 69   Temp 98.3 F (36.8 C) (Oral)   Ht 6\' 1"  (1.854 m)   Wt 250 lb 6.4 oz (113.6 kg)   SpO2 99%   BMI 33.04 kg/m  Physical Exam Constitutional:      Appearance: Normal appearance.  HENT:     Head: Normocephalic and atraumatic.     Mouth/Throat:     Mouth: Mucous membranes are moist.     Pharynx: Oropharynx is clear.  Cardiovascular:     Rate and Rhythm: Normal rate and regular rhythm.     Comments: Trace BLE edema Pulmonary:     Effort: Pulmonary effort is normal.     Breath sounds: Normal breath sounds. No wheezing or rhonchi.     Comments: Fine crackles left base, otherwise clear Musculoskeletal:        General: Normal range of motion.  Cervical back: Normal range of motion and neck supple.  Skin:    General: Skin is warm and dry.  Neurological:     General: No focal deficit present.     Mental Status: He is alert and oriented to person, place, and time. Mental status is at baseline.  Psychiatric:        Mood and Affect: Mood normal.        Behavior: Behavior normal.        Thought Content: Thought content normal.        Judgment: Judgment normal.      Lab Results:  CBC    Component Value Date/Time   WBC 5.6 03/25/2020 0309   RBC 4.07 (L) 03/25/2020 0309   HGB 12.2 (L) 03/25/2020 0309   HCT 37.9 (L) 03/25/2020 0309   PLT 182 03/25/2020 0309   MCV 93.1 03/25/2020 0309   MCH 30.0 03/25/2020 0309   MCHC 32.2 03/25/2020 0309   RDW 13.1 03/25/2020 0309   LYMPHSABS 1.6 03/23/2020 1617   MONOABS 0.6 03/23/2020 1617   EOSABS 0.3 03/23/2020 1617   BASOSABS 0.1 03/23/2020 1617    BMET    Component Value Date/Time   NA 139 03/25/2020 0309   K 3.9 03/25/2020 0309   CL 105 03/25/2020 0309   CO2 24 03/25/2020 0309   GLUCOSE 116 (H) 03/25/2020 0309   BUN 17 03/25/2020 0309   CREATININE 0.99 03/25/2020 0309   CALCIUM 9.6 03/25/2020 0309   GFRNONAA  >60 03/25/2020 0309   GFRAA >60 03/25/2020 0309    BNP No results found for: BNP  ProBNP No results found for: PROBNP  Imaging: CUP PACEART REMOTE DEVICE CHECK  Result Date: 08/20/2020 ILR summary report received. Battery status OK. Normal device function. No new symptom, tachy, brady, or pause episodes. 8 AF events; total burden 0.2%; longest 10 minutes; available ECGs suggest SR w/ ectopy. Monthly summary reports and ROV/PRN. 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 He has has been off oral steriods for the last 5 months without increased shortness of breath.   Nocturnal hypoxia - Sleep study in September 2021 showed severe OSA with SpO2 low 74%. Optimal PAP pressure 16cm h20, he did not require oxygen while on CPAP. There is a 2-3 month wait for CPAP machines. He needs to continue oxygen until he is started on CPAP therapy. FU 4-6 weeks after receiving CPAP machine for compliance check - televisit ok.    Martyn Ehrich, NP 08/27/2020

## 2020-08-30 DIAGNOSIS — Z9889 Other specified postprocedural states: Secondary | ICD-10-CM | POA: Diagnosis not present

## 2020-08-30 DIAGNOSIS — M19011 Primary osteoarthritis, right shoulder: Secondary | ICD-10-CM | POA: Diagnosis not present

## 2020-09-01 NOTE — Progress Notes (Signed)
Carelink Summary Report / Loop Recorder 

## 2020-09-15 ENCOUNTER — Encounter: Payer: Self-pay | Admitting: *Deleted

## 2020-09-15 DIAGNOSIS — I712 Thoracic aortic aneurysm, without rupture, unspecified: Secondary | ICD-10-CM

## 2020-09-16 ENCOUNTER — Encounter: Payer: Self-pay | Admitting: *Deleted

## 2020-09-20 ENCOUNTER — Ambulatory Visit (INDEPENDENT_AMBULATORY_CARE_PROVIDER_SITE_OTHER): Payer: Medicare Other

## 2020-09-20 DIAGNOSIS — I639 Cerebral infarction, unspecified: Secondary | ICD-10-CM

## 2020-09-20 LAB — CUP PACEART REMOTE DEVICE CHECK
Date Time Interrogation Session: 20220102144952
Implantable Pulse Generator Implant Date: 20210708

## 2020-09-30 ENCOUNTER — Other Ambulatory Visit: Payer: Medicare Other

## 2020-10-04 NOTE — Progress Notes (Signed)
Carelink Summary Report / Loop Recorder 

## 2020-10-08 ENCOUNTER — Inpatient Hospital Stay: Admission: RE | Admit: 2020-10-08 | Payer: Medicare Other | Source: Ambulatory Visit

## 2020-10-11 ENCOUNTER — Telehealth: Payer: Self-pay | Admitting: Cardiology

## 2020-10-11 NOTE — Telephone Encounter (Signed)
Spoke with patient regarding appointment scheduled 10/13/20 at 1:00 pm at [place]. Arrival time is 12:40 pm---liquids only 4 hours prior to study.  Patient voiced his understanding.

## 2020-10-12 ENCOUNTER — Ambulatory Visit
Admission: RE | Admit: 2020-10-12 | Discharge: 2020-10-12 | Disposition: A | Discharge status: Home / Self Care | Payer: Medicare Other | Source: Ambulatory Visit | Attending: Cardiology | Admitting: Cardiology

## 2020-10-12 ENCOUNTER — Other Ambulatory Visit: Payer: Self-pay

## 2020-10-12 DIAGNOSIS — J984 Other disorders of lung: Secondary | ICD-10-CM | POA: Diagnosis not present

## 2020-10-12 DIAGNOSIS — I712 Thoracic aortic aneurysm, without rupture, unspecified: Secondary | ICD-10-CM

## 2020-10-12 DIAGNOSIS — K449 Diaphragmatic hernia without obstruction or gangrene: Secondary | ICD-10-CM | POA: Diagnosis not present

## 2020-10-12 DIAGNOSIS — I2510 Atherosclerotic heart disease of native coronary artery without angina pectoris: Secondary | ICD-10-CM | POA: Diagnosis not present

## 2020-10-12 MED ORDER — IOPAMIDOL (ISOVUE-370) INJECTION 76%
75.0000 mL | Freq: Once | INTRAVENOUS | Status: AC | PRN
  Administered 2020-10-12: 75 mL via INTRAVENOUS

## 2020-10-21 ENCOUNTER — Ambulatory Visit (INDEPENDENT_AMBULATORY_CARE_PROVIDER_SITE_OTHER): Payer: Medicare Other

## 2020-10-21 DIAGNOSIS — I639 Cerebral infarction, unspecified: Secondary | ICD-10-CM | POA: Diagnosis not present

## 2020-10-25 LAB — CUP PACEART REMOTE DEVICE CHECK
Date Time Interrogation Session: 20220204144823
Implantable Pulse Generator Implant Date: 20210708

## 2020-10-27 NOTE — Progress Notes (Signed)
Carelink Summary Report / Loop Recorder 

## 2020-10-29 IMAGING — MR MR HEAD W/O CM
6 series · 48 of 48 positions shown · non-contrast
Comparison: 03/23/2020

CLINICAL DATA: Stroke, follow-up; BMS study

EXAM:
MRI HEAD WITHOUT CONTRAST
TECHNIQUE: Multiplanar, multiecho pulse sequences of the brain and surrounding
structures were obtained without intravenous contrast.

[Series 2: DWI · axial · 5.0mm · 0.94mm/px · z∈[-63,+115]mm · 13 of 74 slices shown]
[im 1/74]
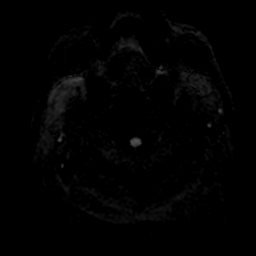
[im 7/74]
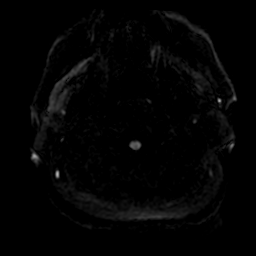
[im 13/74]
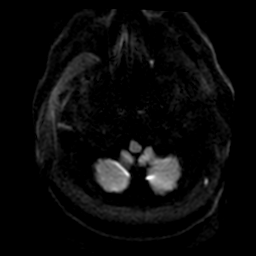
[im 19/74]
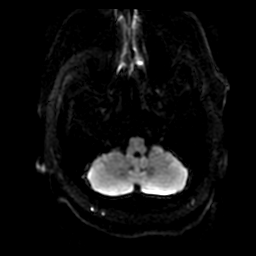
[im 25/74]
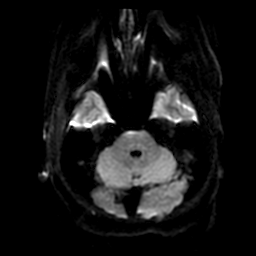
[im 31/74]
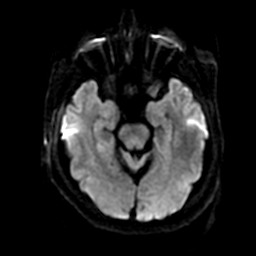
[im 37/74]
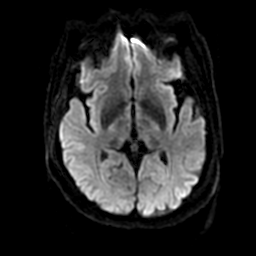
[im 43/74]
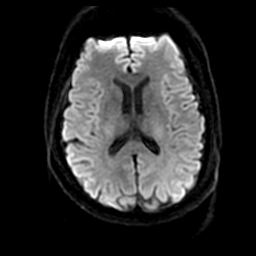
[im 49/74]
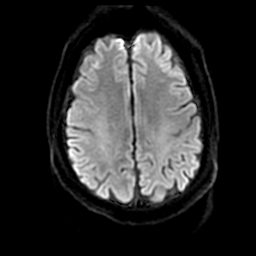
[im 55/74]
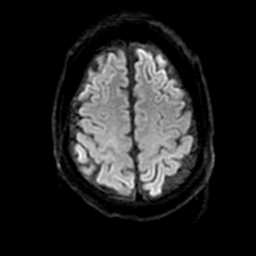
[im 61/74]
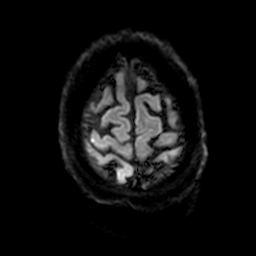
[im 67/74]
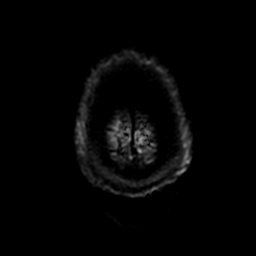
[im 74/74]
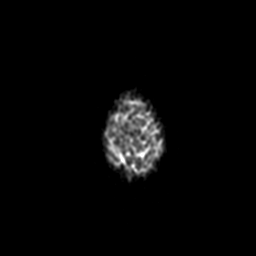

[Series 3: FLAIR · axial · 5.0mm · 0.94mm/px · z∈[-61,+118]mm · 7 of 37 slices shown]
[im 1/37]
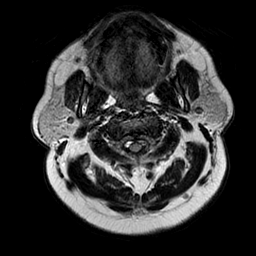
[im 7/37]
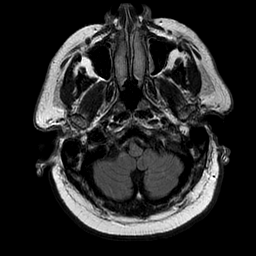
[im 13/37]
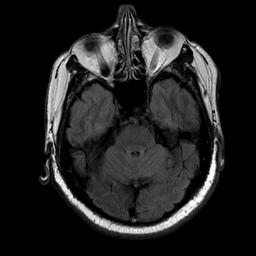
[im 19/37]
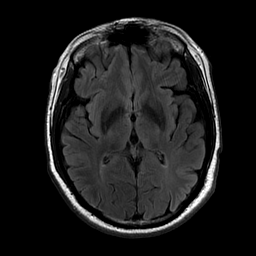
[im 25/37]
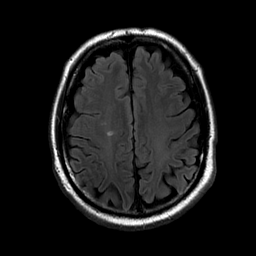
[im 31/37]
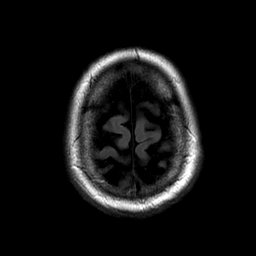
[im 37/37]
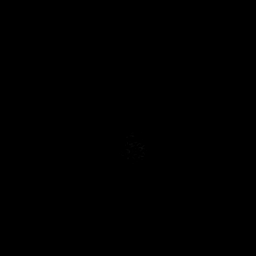

[Series 4: T2-star · axial · 5.0mm · 0.94mm/px · z∈[-61,+118]mm · 7 of 37 slices shown (1 of 2)]
[im 1/37]
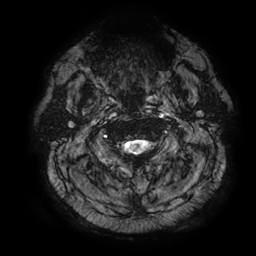
[im 7/37]
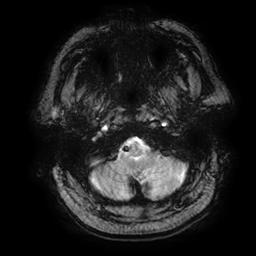
[im 13/37]
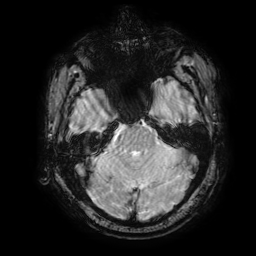
[im 19/37]
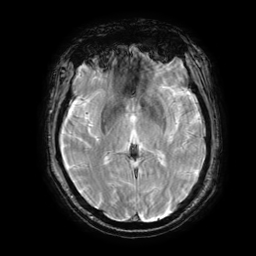
[im 25/37]
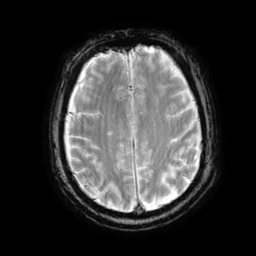
[im 31/37]
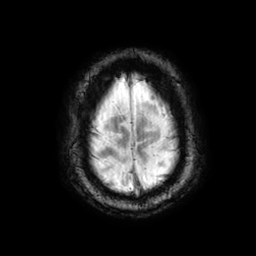
[im 37/37]
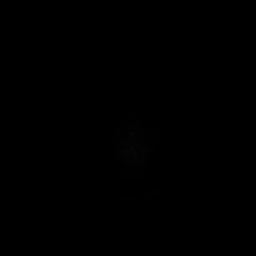

[Series 5: T2-star · axial · 5.0mm · 0.94mm/px · z∈[-61,+118]mm · 7 of 37 slices shown (2 of 2)]
[im 1/37]
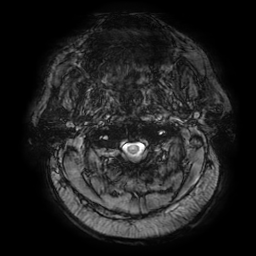
[im 7/37]
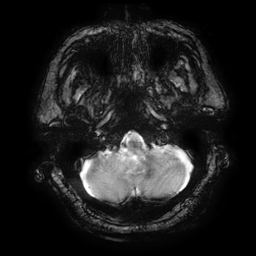
[im 13/37]
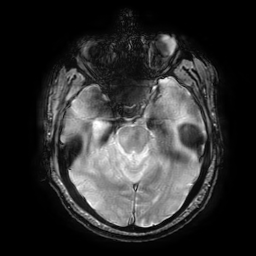
[im 19/37]
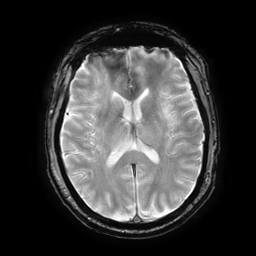
[im 25/37]
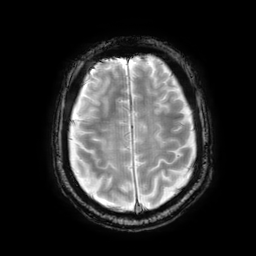
[im 31/37]
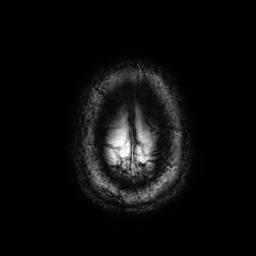
[im 37/37]
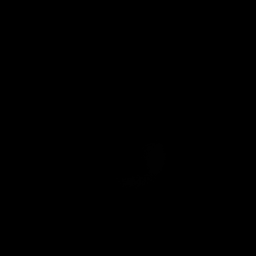

[Series 6: T1 · axial · 5.0mm · 0.94mm/px · z∈[-61,+118]mm · 7 of 37 slices shown]
[im 1/37]
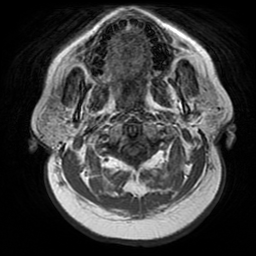
[im 7/37]
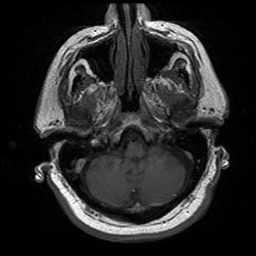
[im 13/37]
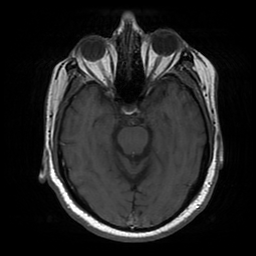
[im 19/37]
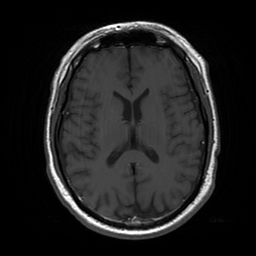
[im 25/37]
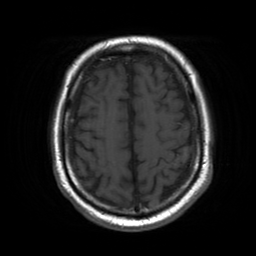
[im 31/37]
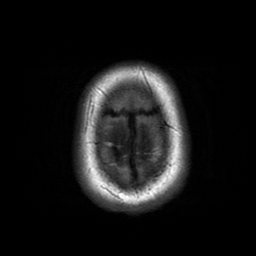
[im 37/37]
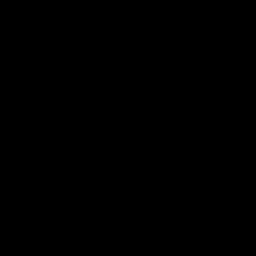

[Series 250: ADC · axial · 5.0mm · 0.94mm/px · z∈[-63,+115]mm · 7 of 37 slices shown]
[im 1/37]
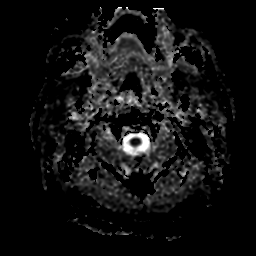
[im 7/37]
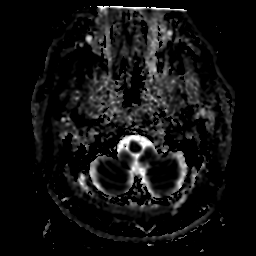
[im 13/37]
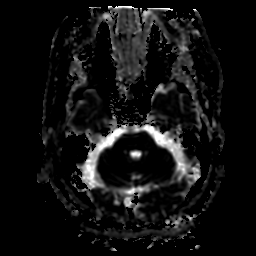
[im 19/37]
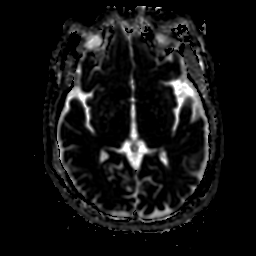
[im 25/37]
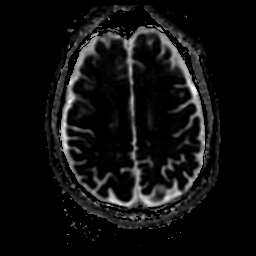
[im 31/37]
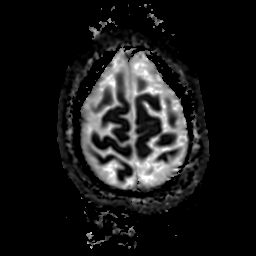
[im 37/37]
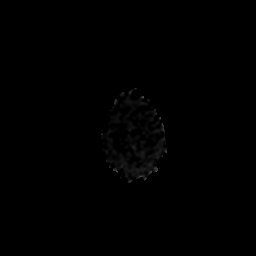

[48 of 48 positions shown; findings below may reference images not displayed]

FINDINGS: Brain: There is a punctate focus of reduced diffusion involving the
dorsal cortex of the right precentral gyrus just lateral to the hand
motor region. This is present on the prior study in retrospect.

No evidence of intracranial hemorrhage. Ventricles and sulci are
within normal limits in size and configuration. There is a chronic
small vessel infarct of the right centrum semiovale. Additional
minimal foci of T2 hyperintensity in the supratentorial white matter
are nonspecific but may reflect minor chronic microvascular ischemic
changes.

Vascular: Major vessel flow voids at the skull base are preserved.

Skull and upper cervical spine: Normal marrow signal is preserved.

Sinuses/Orbits: Paranasal sinuses are aerated. No acute orbital
finding.

Other: Sella is unremarkable.  Mastoid air cells are clear.
IMPRESSION: Punctate acute infarct of the right precentral gyrus also present on
the prior study.

## 2020-11-11 DIAGNOSIS — K2270 Barrett's esophagus without dysplasia: Secondary | ICD-10-CM | POA: Diagnosis not present

## 2020-11-11 DIAGNOSIS — R609 Edema, unspecified: Secondary | ICD-10-CM | POA: Diagnosis not present

## 2020-11-11 DIAGNOSIS — I10 Essential (primary) hypertension: Secondary | ICD-10-CM | POA: Diagnosis not present

## 2020-11-11 DIAGNOSIS — E7800 Pure hypercholesterolemia, unspecified: Secondary | ICD-10-CM | POA: Diagnosis not present

## 2020-11-11 DIAGNOSIS — J8489 Other specified interstitial pulmonary diseases: Secondary | ICD-10-CM | POA: Diagnosis not present

## 2020-11-11 DIAGNOSIS — E039 Hypothyroidism, unspecified: Secondary | ICD-10-CM | POA: Diagnosis not present

## 2020-11-11 DIAGNOSIS — M48061 Spinal stenosis, lumbar region without neurogenic claudication: Secondary | ICD-10-CM | POA: Diagnosis not present

## 2020-11-11 DIAGNOSIS — G629 Polyneuropathy, unspecified: Secondary | ICD-10-CM | POA: Diagnosis not present

## 2020-11-11 DIAGNOSIS — I639 Cerebral infarction, unspecified: Secondary | ICD-10-CM | POA: Diagnosis not present

## 2020-11-22 ENCOUNTER — Ambulatory Visit (INDEPENDENT_AMBULATORY_CARE_PROVIDER_SITE_OTHER): Payer: Medicare Other

## 2020-11-22 DIAGNOSIS — I639 Cerebral infarction, unspecified: Secondary | ICD-10-CM

## 2020-11-25 LAB — CUP PACEART REMOTE DEVICE CHECK
Date Time Interrogation Session: 20220309145003
Implantable Pulse Generator Implant Date: 20210708

## 2020-11-30 NOTE — Progress Notes (Signed)
Carelink Summary Report / Loop Recorder 

## 2020-12-07 ENCOUNTER — Other Ambulatory Visit: Payer: Self-pay | Admitting: Neurology

## 2020-12-17 DIAGNOSIS — G4731 Primary central sleep apnea: Secondary | ICD-10-CM

## 2020-12-17 NOTE — Telephone Encounter (Signed)
Forwarding to [name]- please see patient email. I have contacted [facility] and we now have access to pt's download via [employer]. I tried to attach a copy of the download to this note but am unable to.   [name] please advise on recs for patient. Thanks!

## 2020-12-20 NOTE — Telephone Encounter (Signed)
[name]

## 2020-12-21 ENCOUNTER — Telehealth: Payer: Self-pay | Admitting: Pulmonary Disease

## 2020-12-21 DIAGNOSIS — G4733 Obstructive sleep apnea (adult) (pediatric): Secondary | ICD-10-CM

## 2020-12-21 DIAGNOSIS — G4734 Idiopathic sleep related nonobstructive alveolar hypoventilation: Secondary | ICD-10-CM

## 2020-12-21 NOTE — Telephone Encounter (Signed)
Called and spoke with patient to let him know that [name] said if he is using CPAP nightly then he can return oxygen. Patient expressed understanding. Order has been placed to D/C oxygen and to be picked up. Nothing further needed at this time.

## 2020-12-21 NOTE — Telephone Encounter (Signed)
If he does not use CPAP, he needs to wear oxygen at night. If he uses CPAP nightly, he can return the oxygen.

## 2020-12-21 NOTE — Telephone Encounter (Signed)
Received message from [place] our [facility] who states that she called patient to give him cpap titration appt info. He asked if Huey Romans needs to pick up O2 - he is currently on cpap & states he no longer uses the O2. Patient has a sleep study done on 06/08/20, ONO on 03/25/2020 and is currently scheduled for a CPAP titration on 02/18/2021.   [name] would you like for him to keep the oxygen until we get those results or can we send in order for it to be picked up?

## 2020-12-21 NOTE — Telephone Encounter (Signed)
Reviewed report. Patient with CSA 10.4 and OSA 3.4 on auto-PAP. Will address treatment emergent CSA with PAP titration on ASV.  Discussed diagnosis with patient and plan as noted above. Advised him to continue CPAP until PAP titration completed.  Staff, please order PAP titration with comment: Patient needs ASV. If unable to tolerate, titrate on BiPAP ST.  Rodman Pickle, M.D. [facility] Pulmonary/Critical Care Medicine 12/21/2020 1:20 PM

## 2020-12-21 NOTE — Telephone Encounter (Signed)
Order has been placed.

## 2020-12-23 ENCOUNTER — Ambulatory Visit (INDEPENDENT_AMBULATORY_CARE_PROVIDER_SITE_OTHER): Payer: Medicare Other

## 2020-12-23 DIAGNOSIS — I639 Cerebral infarction, unspecified: Secondary | ICD-10-CM | POA: Diagnosis not present

## 2020-12-28 LAB — CUP PACEART REMOTE DEVICE CHECK
Date Time Interrogation Session: 20220411144607
Implantable Pulse Generator Implant Date: 20210708

## 2021-01-04 NOTE — Progress Notes (Signed)
Carelink Summary Report / Loop Recorder 

## 2021-01-24 ENCOUNTER — Ambulatory Visit (INDEPENDENT_AMBULATORY_CARE_PROVIDER_SITE_OTHER): Payer: Medicare Other

## 2021-01-24 DIAGNOSIS — I639 Cerebral infarction, unspecified: Secondary | ICD-10-CM

## 2021-01-27 DIAGNOSIS — M170 Bilateral primary osteoarthritis of knee: Secondary | ICD-10-CM | POA: Diagnosis not present

## 2021-01-29 LAB — CUP PACEART REMOTE DEVICE CHECK
Date Time Interrogation Session: 20220514144601
Implantable Pulse Generator Implant Date: 20210708

## 2021-02-03 DIAGNOSIS — M1712 Unilateral primary osteoarthritis, left knee: Secondary | ICD-10-CM | POA: Diagnosis not present

## 2021-02-03 DIAGNOSIS — M1711 Unilateral primary osteoarthritis, right knee: Secondary | ICD-10-CM | POA: Diagnosis not present

## 2021-02-03 DIAGNOSIS — M170 Bilateral primary osteoarthritis of knee: Secondary | ICD-10-CM | POA: Diagnosis not present

## 2021-02-08 DIAGNOSIS — L821 Other seborrheic keratosis: Secondary | ICD-10-CM | POA: Diagnosis not present

## 2021-02-08 DIAGNOSIS — D225 Melanocytic nevi of trunk: Secondary | ICD-10-CM | POA: Diagnosis not present

## 2021-02-08 DIAGNOSIS — L570 Actinic keratosis: Secondary | ICD-10-CM | POA: Diagnosis not present

## 2021-02-08 DIAGNOSIS — D2261 Melanocytic nevi of right upper limb, including shoulder: Secondary | ICD-10-CM | POA: Diagnosis not present

## 2021-02-08 DIAGNOSIS — D1801 Hemangioma of skin and subcutaneous tissue: Secondary | ICD-10-CM | POA: Diagnosis not present

## 2021-02-10 ENCOUNTER — Telehealth: Payer: Self-pay | Admitting: Pulmonary Disease

## 2021-02-10 DIAGNOSIS — G4733 Obstructive sleep apnea (adult) (pediatric): Secondary | ICD-10-CM

## 2021-02-10 DIAGNOSIS — M170 Bilateral primary osteoarthritis of knee: Secondary | ICD-10-CM | POA: Diagnosis not present

## 2021-02-10 NOTE — Telephone Encounter (Signed)
I am aware that patient does not need a CPAP titration. On day order was placed (12/21/20 notes), comments should have included ASV or BiPAP ST. Please contact me via epic message regarding order placement.

## 2021-02-10 NOTE — Telephone Encounter (Signed)
Called and spoke to Kia with [facility] and was advised the CPAP titration order that has been placed may not work best for the pt considering he may not be tolerating the CPAP very well. Kia states that because he has had a cpap titration and if he is indeed having trouble with the CPAP then she recommends pt having a Bipap titration then transition to ASV if necessary.   [name], please advise if you would like to cancel the current cpap titration and order a bipap titration. Thanks.

## 2021-02-10 NOTE — Telephone Encounter (Signed)
The order placed on 4/5 was for cpap titration and not bipap/asv titration.  New order has been placed to replace 6/3 cpap titration study.  Routing to [facility] to ensure this is changed. Thanks!

## 2021-02-10 NOTE — Progress Notes (Signed)
Carelink Summary Report / Loop Recorder 

## 2021-02-10 NOTE — Telephone Encounter (Addendum)
ASV order has been placed.  Called Kia at sleep lab & she states once she explained to [place] what she was told by nurse yesterday he stated they can do what is needed from cpap titration order and ASV order is not needed.  I documented on the ASV order & nothing further needed.  Pt already scheduled for 6/6.

## 2021-02-17 DIAGNOSIS — M25511 Pain in right shoulder: Secondary | ICD-10-CM | POA: Diagnosis not present

## 2021-02-17 DIAGNOSIS — M25512 Pain in left shoulder: Secondary | ICD-10-CM | POA: Diagnosis not present

## 2021-02-18 ENCOUNTER — Other Ambulatory Visit: Payer: Self-pay

## 2021-02-18 ENCOUNTER — Ambulatory Visit (HOSPITAL_BASED_OUTPATIENT_CLINIC_OR_DEPARTMENT_OTHER): Payer: Medicare Other | Attending: Pulmonary Disease | Admitting: Pulmonary Disease

## 2021-02-18 DIAGNOSIS — G4731 Primary central sleep apnea: Secondary | ICD-10-CM

## 2021-02-18 DIAGNOSIS — Z79899 Other long term (current) drug therapy: Secondary | ICD-10-CM | POA: Diagnosis not present

## 2021-02-18 DIAGNOSIS — G4733 Obstructive sleep apnea (adult) (pediatric): Secondary | ICD-10-CM | POA: Insufficient documentation

## 2021-02-22 ENCOUNTER — Telehealth: Payer: Self-pay | Admitting: Pulmonary Disease

## 2021-02-22 DIAGNOSIS — G4731 Primary central sleep apnea: Secondary | ICD-10-CM | POA: Diagnosis not present

## 2021-02-22 DIAGNOSIS — G4734 Idiopathic sleep related nonobstructive alveolar hypoventilation: Secondary | ICD-10-CM

## 2021-02-22 NOTE — Telephone Encounter (Signed)
Please order the following BiPAP supplies:  BiPAP therapy on 16/12 cm H2O with a Medium size Resmed Full Face Mask AirFit F20 mask and heated humidification. No supplemental oxygen required when on BiPAP.  He will need to return his CPAP machine.  Rodman Pickle, M.D. [facility] Pulmonary/Critical Care Medicine 02/22/2021 3:31 PM   See Amion for personal pager For hours between 7 PM to 7 AM, please call [place] for urgent questions

## 2021-02-22 NOTE — Telephone Encounter (Signed)
ATC Patient.  LM to call back. 

## 2021-02-22 NOTE — Telephone Encounter (Signed)
I called and spoke with patient regarding [name] recs. Patient verbalized understanding and will give CPAP machine back when getting Bipap machine. I sent in order for Bipap with given pressures to [facility]. Nothing further needed.

## 2021-02-22 NOTE — Telephone Encounter (Signed)
Patient is returning phone call. Patient phone number is [phone].

## 2021-02-22 NOTE — Procedures (Signed)
    Patient Name: Colin [name], Colin [name] Date: 02/18/2021 Gender: Male D.O.B: 09-Mar-1951 Age (years): 56 Referring Provider: Chi Rodman Pickle Height (inches): 8 Interpreting Physician: Chesley Mires MD, ABSM Weight (lbs): 265 RPSGT: Zadie Rhine BMI: 35 MRN: [id number] Neck Size: 19.00  CLINICAL INFORMATION The patient is referred for a BiPAP titration to treat sleep apnea.  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  Date of NPSG 05/26/20: AHI 72.7, SpO2 low 72%.  SLEEP STUDY TECHNIQUE As per the AASM Manual for the Scoring of Sleep and Associated Events v2.3 (April 2016) with a hypopnea requiring 4% desaturations.  The channels recorded and monitored were frontal, central and occipital EEG, electrooculogram (EOG), submentalis EMG (chin), nasal and oral airflow, thoracic and abdominal wall motion, anterior tibialis EMG, snore microphone, electrocardiogram, and pulse oximetry. Bilevel positive airway pressure (BPAP) was initiated at the beginning of the study and titrated to treat sleep-disordered breathing.  MEDICATIONS Medications self-administered by patient taken the night of the study : MELATONIN, BENZADRYL  RESPIRATORY PARAMETERS Optimal IPAP Pressure (cm): 16 AHI at Optimal Pressure (/hr) 2.8 Optimal EPAP Pressure (cm): 12   Overall Minimal O2 (%): 75.0 Minimal O2 at Optimal Pressure (%): 90.0  SLEEP ARCHITECTURE Start Time: 10:20:10 PM Stop Time: 4:59:33 AM Total Time (min): 399.4 Total Sleep Time (min): 357.9 Sleep Latency (min): 4.0 Sleep Efficiency (%): 89.6% REM Latency (min): 43.5 WASO (min): 37.5 Stage N1 (%): 4.3% Stage N2 (%): 78.1% Stage N3 (%): 0.0% Stage R (%): 17.6 Supine (%): 68.46 Arousal Index (/hr): 12.1   CARDIAC DATA The 2 lead EKG demonstrated sinus rhythm. The mean heart rate was 48.7 beats per minute. Other EKG findings include: None.  LEG MOVEMENT DATA The total Periodic Limb Movements of Sleep (PLMS) were 0. The PLMS index  was 0.0. A PLMS index of <15 is considered normal in adults.  IMPRESSIONS - He continued to have respiratory events with oxygen desaturation while on CPAP. - He did better once transitioned to Bipap 16/12 cm H2O. - He did not require the use of supplemental oxygen during this study.  DIAGNOSIS - Obstructive Sleep Apnea (G47.33)  RECOMMENDATIONS - Trial of BiPAP therapy on 16/12 cm H2O with a Medium size Resmed Full Face Mask AirFit F20 mask and heated humidification. - Avoid alcohol, sedatives and other CNS depressants that may worsen sleep apnea and disrupt normal sleep architecture. - Sleep hygiene should be reviewed to assess factors that may improve sleep quality. - Weight management and regular exercise should be initiated or continued.  [Electronically signed] 02/22/2021 01:38 PM  Chesley Mires MD, [place], American Board of Sleep Medicine   NPI: [id number]

## 2021-03-01 DIAGNOSIS — R531 Weakness: Secondary | ICD-10-CM | POA: Diagnosis not present

## 2021-03-01 DIAGNOSIS — M25512 Pain in left shoulder: Secondary | ICD-10-CM | POA: Diagnosis not present

## 2021-03-01 DIAGNOSIS — M7542 Impingement syndrome of left shoulder: Secondary | ICD-10-CM | POA: Diagnosis not present

## 2021-03-01 DIAGNOSIS — M25612 Stiffness of left shoulder, not elsewhere classified: Secondary | ICD-10-CM | POA: Diagnosis not present

## 2021-03-04 ENCOUNTER — Ambulatory Visit (INDEPENDENT_AMBULATORY_CARE_PROVIDER_SITE_OTHER): Payer: Medicare Other

## 2021-03-04 DIAGNOSIS — I639 Cerebral infarction, unspecified: Secondary | ICD-10-CM

## 2021-03-04 LAB — CUP PACEART REMOTE DEVICE CHECK
Date Time Interrogation Session: 20220616144925
Implantable Pulse Generator Implant Date: 20210708

## 2021-03-08 DIAGNOSIS — R531 Weakness: Secondary | ICD-10-CM | POA: Diagnosis not present

## 2021-03-08 DIAGNOSIS — M25612 Stiffness of left shoulder, not elsewhere classified: Secondary | ICD-10-CM | POA: Diagnosis not present

## 2021-03-08 DIAGNOSIS — M25512 Pain in left shoulder: Secondary | ICD-10-CM | POA: Diagnosis not present

## 2021-03-08 DIAGNOSIS — M7542 Impingement syndrome of left shoulder: Secondary | ICD-10-CM | POA: Diagnosis not present

## 2021-03-15 DIAGNOSIS — M7542 Impingement syndrome of left shoulder: Secondary | ICD-10-CM | POA: Diagnosis not present

## 2021-03-15 DIAGNOSIS — M25612 Stiffness of left shoulder, not elsewhere classified: Secondary | ICD-10-CM | POA: Diagnosis not present

## 2021-03-15 DIAGNOSIS — R531 Weakness: Secondary | ICD-10-CM | POA: Diagnosis not present

## 2021-03-15 DIAGNOSIS — M25512 Pain in left shoulder: Secondary | ICD-10-CM | POA: Diagnosis not present

## 2021-03-22 DIAGNOSIS — M7542 Impingement syndrome of left shoulder: Secondary | ICD-10-CM | POA: Diagnosis not present

## 2021-03-22 DIAGNOSIS — R531 Weakness: Secondary | ICD-10-CM | POA: Diagnosis not present

## 2021-03-22 DIAGNOSIS — M25512 Pain in left shoulder: Secondary | ICD-10-CM | POA: Diagnosis not present

## 2021-03-22 DIAGNOSIS — M25612 Stiffness of left shoulder, not elsewhere classified: Secondary | ICD-10-CM | POA: Diagnosis not present

## 2021-03-23 NOTE — Progress Notes (Signed)
Carelink Summary Report / Loop Recorder 

## 2021-04-01 ENCOUNTER — Other Ambulatory Visit: Payer: Self-pay

## 2021-04-01 ENCOUNTER — Ambulatory Visit (INDEPENDENT_AMBULATORY_CARE_PROVIDER_SITE_OTHER): Payer: Medicare Other | Admitting: Primary Care

## 2021-04-01 ENCOUNTER — Encounter: Payer: Self-pay | Admitting: Primary Care

## 2021-04-01 VITALS — BP 118/80 | HR 52 | Ht 73.0 in | Wt 261.0 lb

## 2021-04-01 DIAGNOSIS — G4733 Obstructive sleep apnea (adult) (pediatric): Secondary | ICD-10-CM | POA: Diagnosis not present

## 2021-04-01 DIAGNOSIS — J8489 Other specified interstitial pulmonary diseases: Secondary | ICD-10-CM | POA: Diagnosis not present

## 2021-04-01 HISTORY — DX: Obstructive sleep apnea (adult) (pediatric): G47.33

## 2021-04-01 NOTE — Progress Notes (Signed)
@Patient  ID: Colin [name], male    DOB: [birth year], 70 y.o.   MRN: [id number]  Chief Complaint  Patient presents with   Sleep Apnea    Referring provider: Antony Contras, MD  HPI: 70 year old male, never smoke. PMH significant for NSIP, COVID-19, nocturnal hypoxemia, recent cryptogenic stroke, hypertension, GERD and DDD. Patient of [name].  Hospitalized with COVID-19 pneumonia in September 2020. Parenchymal changes on CT. Autoimmune work-up negative. He was started on steroids in November 2020, initial improvement with some recurrent shortness of breath with tapper.   Previous [facility] pulmonary encounter: 03/15/2020 Patient presents today for 2 month follow-up/ oxygen check. Hx Covid pneumonia in September 2020. He was started or oral steroids in November. Initially had some improvement but experienced recurrence in shortness of breath with taper. Goal is to come off steroids completely. Prednisone tapered to 5mg  daily and continues Bactrim MWF. Repeat CT chest in June showed stable fibrosis of the lungs compared to January and likely d/t covid-19. No acute process, evidence of CAD and pulmonary HTN. He had an echocardiogram with Dr. Stanford Breed in February which showed normal LV function, mildly elevated pulmonary artery systolic pressure 27CWCB. He has been inactive d/t hip pain. He had surgery 4 weeks ago on right rotator cuff. He is scheduled for MRI ordered by orthopedics.  He has been seen by Rheumatology in the past for back pain. RF and ANA labs negative in November 2020. He reports back and bilateral hip pain. Neuropathy in both legs, worse in right. He is still wearing 2L oxygen at night, he does not feel like he needs it. Denies snoring or interruptions in his sleep. He has a follow up with Cardiology in late August. He has an apt with Neurology in August as well.   04/29/20- [name]  He has been off steroids since July. He has minimal shortness of breath. He had an overnight oximetry  test demonstrating hypoxemia so he has continued to wear his nightly oxygen. Denies difficulty concentrating or memory issues. He reports mobility issues with worsening cervical stenosis and recent TIA. He has fallen and injured his shoulder so has had right rotator cuff surgery this year. Reports he has lost 15-20lbs this summer. He continues to bicycle at home.   05/19/20- [name] No changes since our last telephone visit. Has minimal shortness of breath. He is scheduled for sleep study next week. Compliant with nightly oxygen. Remains as active at home as possible. His cervical stenosis does limit his activity.   08/27/2020 Patient presents today for 3 month follow-up. He had sleep study in September 2021 that showed severe obstructive sleep apnea, AHI 72.7/hr with SpO2 low 72%. Optimal PAP pressure was 16cm h20, he did not require nocturnal oxygen. He is still waiting on CPAP machine from [facility], there is a 2-3 month wait time for machines currently. Patient is requiring oxygen until he can be started CPAP.   Breathing has been fine. He doesn't exert himself much d/t back limitations. He does some exercising in the pool which can cause him to get his breathing up. Denies shortness of breath day to day.    04/01/2021- Interim  Patient presents today for 6 month follow-up. Initial sleep study in September 2021 that showed severe OSA with SpO2 low 74%, optimal pressure was 16cm h20. He did not require oxygen while on CPAP. In April patient started wearing CPAP. [name] ordered patient for BiPAP/ASV titration. He was ordered for BIPAP therapy 16/12 cm  h20 with medium size resmed full face mask. No supplemental oxygen required while on BIPAP. He has a hard time fallings asleep. Reports issues with airleak and dry mouth. He has humidified air. No issues with breathing or shortness of breath during the day.    Airview download 03/10/21-03/30/21 Usage 20/21 days (95%); 17 days (81%) > 4 hours Average  usage days used 5 hours 11 mins Pressure Ipap 16; Epap 12 cm h20 Airleaks 40.1L/min (95%) AHI 6.6    Prednisone taper history 11/12 -12/1 Prednisone 50 mg QD 12/1-12/15 Prednisone 40 mg QD 12/15-1/5 Prednisone 30 mg QD 1/5-1/20 Prednisone 20 mg QD 1/20-2/17 Alternate 30 and 20 mg every other day 3/19-4/15 Alternative 20mg  and 10 mg every day 4/15-6/28 5mg  QD 6/28-7/5 Alternate 5 mg every other day July 2021- STOPPED oral steriods    No Known Allergies  Immunization History  Administered Date(s) Administered   DTaP 02/15/2010   Influenza Split 06/14/2009, 08/02/2010, 07/24/2012, 06/20/2013   Influenza, High Dose Seasonal PF 08/29/2018, 06/16/2019   PFIZER(Purple Top)SARS-COV-2 Vaccination 11/13/2019, 12/11/2019   Pneumococcal Conjugate-13 04/30/2018   Pneumococcal Polysaccharide-23 06/18/2019   Tdap 02/15/2010, 05/11/2020   Zoster, Live 12/04/2012    Past Medical History:  Diagnosis Date   Arthritis    Barrett's esophagus    Bowel trouble    Stool leakage since colonscopy 02/2016   Cancer ([place])    skin cancer   Carpal tunnel syndrome, bilateral    Depression    in teenage years    GERD (gastroesophageal reflux disease)    History of COVID-19    Hyperlipemia    Hypertension    Hypothyroidism    Interstitial lung disease ([place])    OSA treated with BiPAP 04/01/2021   Peripheral neuropathy    Pneumonia    Shortness of breath dyspnea    with bending over    Stroke ([place]) 03/2020   cryptogenic   TIA (transient ischemic attack) 03/2020   Wears glasses     Tobacco History: Social History   Tobacco Use  Smoking Status Never  Smokeless Tobacco Never   Counseling given: Not Answered   Outpatient Medications Prior to Visit  Medication Sig Dispense Refill   acetaminophen (TYLENOL) 500 MG tablet Take 1,000 mg by mouth every 6 (six) hours as needed for mild pain or moderate pain.     clopidogrel (PLAVIX) 75 MG tablet Take 75 mg by mouth daily.     levothyroxine  (SYNTHROID) 75 MCG tablet Take 75 mcg by mouth daily.      losartan-hydrochlorothiazide (HYZAAR) 50-12.5 MG tablet Take 0.5 tablets by mouth daily.     Omega-3 Fatty Acids (FISH OIL) 1000 MG CAPS Take 1,000 mg by mouth daily.      pantoprazole (PROTONIX) 20 MG tablet Take 20 mg by mouth daily.     rosuvastatin (CRESTOR) 20 MG tablet Take 1 tablet (20 mg total) by mouth daily. 90 tablet 1   No facility-administered medications prior to visit.    Review of System Review of Systems  Constitutional: Negative.   HENT:         Dry mouth  Respiratory: Negative.    Cardiovascular: Negative.     Physical Exam  BP 118/80   Pulse (!) 52   Ht 6\' 1"  (1.854 m)   Wt 261 lb (118.4 kg)   SpO2 99%   BMI 34.43 kg/m  Physical Exam Constitutional:      Appearance: Normal appearance.  HENT:     Head: Normocephalic and  atraumatic.     Mouth/Throat:     Mouth: Mucous membranes are moist.     Pharynx: Oropharynx is clear.  Cardiovascular:     Rate and Rhythm: Normal rate and regular rhythm.  Pulmonary:     Effort: Pulmonary effort is normal.     Breath sounds: Normal breath sounds.  Neurological:     General: No focal deficit present.     Mental Status: He is alert and oriented to person, place, and time. Mental status is at baseline.  Psychiatric:        Mood and Affect: Mood normal.        Behavior: Behavior normal.        Thought Content: Thought content normal.        Judgment: Judgment normal.     Lab Results:  CBC    Component Value Date/Time   WBC 5.6 03/25/2020 0309   RBC 4.07 (L) 03/25/2020 0309   HGB 12.2 (L) 03/25/2020 0309   HCT 37.9 (L) 03/25/2020 0309   PLT 182 03/25/2020 0309   MCV 93.1 03/25/2020 0309   MCH 30.0 03/25/2020 0309   MCHC 32.2 03/25/2020 0309   RDW 13.1 03/25/2020 0309   LYMPHSABS 1.6 03/23/2020 1617   MONOABS 0.6 03/23/2020 1617   EOSABS 0.3 03/23/2020 1617   BASOSABS 0.1 03/23/2020 1617    BMET    Component Value Date/Time   NA 139  03/25/2020 0309   K 3.9 03/25/2020 0309   CL 105 03/25/2020 0309   CO2 24 03/25/2020 0309   GLUCOSE 116 (H) 03/25/2020 0309   BUN 17 03/25/2020 0309   CREATININE 0.99 03/25/2020 0309   CALCIUM 9.6 03/25/2020 0309   GFRNONAA >60 03/25/2020 0309   GFRAA >60 03/25/2020 0309    BNP No results found for: BNP  ProBNP No results found for: PROBNP  Imaging: CUP PACEART REMOTE DEVICE CHECK  Result Date: 03/04/2021 ILR summary report received. Battery status OK. Normal device function. No new symptom, tachy, brady, or pause episodes. No new AF episodes. Monthly summary reports and ROV/PRN. HB    Assessment & Plan:   OSA treated with BiPAP - Initial sleep study in September 2021 that showed severe OSA, AHI 72.7/hr with SpO2 low 72%. He underwent BIPAP/AVS titration in June 2022, ordered for BIPAP 16/12 cm h20 with medium size Resmed full face mask. No supplemental oxygen required while on BIPAP. He is 95% compliant with use, average usage days used 5 hours 11 mins. He has been having issues with airleaks and dry mouth. Current pressure 16/12 cm h20, residual AHI 6.6. He is having large amount of airleaks. Review download with Dr. Halford Chessman, recommed decreasing pressure setting 14/10cm h20 which may help with airleaks and dry mouth symptoms. FU in 4 weeks with download.   NSIP (nonspecific interstitial pneumonia) ([place]) - Asymptomatic. 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 PFTs normalized. He has has been off oral steriods without increased shortness of breath.      Martyn Ehrich, NP 04/01/2021

## 2021-04-01 NOTE — Patient Instructions (Signed)
Nice seeing you today Colin [name]  Recommendations: - Decrease BIPAP pressure to help with airleaks, also try re-adjusting mask while lying down in bed  Use Biotene for dry mouth before bed  Orders: - Change BIPAP pressure IPAP14cm H20 / EPAP 10cm H20  Follow-up: - 1 month televisit with Beth to review download

## 2021-04-01 NOTE — Assessment & Plan Note (Addendum)
-   Initial sleep study in September 2021 that showed severe OSA, AHI 72.7/hr with SpO2 low 72%. He underwent BIPAP/AVS titration in June 2022, ordered for BIPAP 16/12 cm h20 with medium size Resmed full face mask. No supplemental oxygen required while on BIPAP. He is 95% compliant with use, average usage days used 5 hours 11 mins. He has been having issues with airleaks and dry mouth. Current pressure 16/12 cm h20, residual AHI 6.6. He is having large amount of airleaks. Review download with Dr. Halford Chessman, recommed decreasing pressure setting 14/10cm h20 which may help with airleaks and dry mouth symptoms. FU in 4 weeks with download.

## 2021-04-01 NOTE — Assessment & Plan Note (Addendum)
-   Asymptomatic. 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 PFTs normalized. He has has been off oral steriods without increased shortness of breath.

## 2021-04-04 ENCOUNTER — Ambulatory Visit (INDEPENDENT_AMBULATORY_CARE_PROVIDER_SITE_OTHER): Payer: Medicare Other

## 2021-04-04 DIAGNOSIS — I639 Cerebral infarction, unspecified: Secondary | ICD-10-CM | POA: Diagnosis not present

## 2021-04-04 DIAGNOSIS — M5441 Lumbago with sciatica, right side: Secondary | ICD-10-CM | POA: Diagnosis not present

## 2021-04-04 DIAGNOSIS — M438X9 Other specified deforming dorsopathies, site unspecified: Secondary | ICD-10-CM | POA: Diagnosis not present

## 2021-04-04 DIAGNOSIS — M419 Scoliosis, unspecified: Secondary | ICD-10-CM | POA: Diagnosis not present

## 2021-04-04 DIAGNOSIS — I10 Essential (primary) hypertension: Secondary | ICD-10-CM | POA: Diagnosis not present

## 2021-04-04 DIAGNOSIS — M5442 Lumbago with sciatica, left side: Secondary | ICD-10-CM | POA: Diagnosis not present

## 2021-04-04 DIAGNOSIS — M5416 Radiculopathy, lumbar region: Secondary | ICD-10-CM | POA: Diagnosis not present

## 2021-04-04 DIAGNOSIS — G8929 Other chronic pain: Secondary | ICD-10-CM | POA: Diagnosis not present

## 2021-04-04 DIAGNOSIS — Z6834 Body mass index (BMI) 34.0-34.9, adult: Secondary | ICD-10-CM | POA: Diagnosis not present

## 2021-04-04 DIAGNOSIS — M4125 Other idiopathic scoliosis, thoracolumbar region: Secondary | ICD-10-CM | POA: Diagnosis not present

## 2021-04-05 LAB — CUP PACEART REMOTE DEVICE CHECK
Date Time Interrogation Session: 20220719144646
Implantable Pulse Generator Implant Date: 20210708

## 2021-04-15 DIAGNOSIS — R531 Weakness: Secondary | ICD-10-CM | POA: Diagnosis not present

## 2021-04-15 DIAGNOSIS — M25512 Pain in left shoulder: Secondary | ICD-10-CM | POA: Diagnosis not present

## 2021-04-15 DIAGNOSIS — M25612 Stiffness of left shoulder, not elsewhere classified: Secondary | ICD-10-CM | POA: Diagnosis not present

## 2021-04-15 DIAGNOSIS — M7542 Impingement syndrome of left shoulder: Secondary | ICD-10-CM | POA: Diagnosis not present

## 2021-04-18 DIAGNOSIS — M25512 Pain in left shoulder: Secondary | ICD-10-CM | POA: Diagnosis not present

## 2021-04-18 DIAGNOSIS — M7542 Impingement syndrome of left shoulder: Secondary | ICD-10-CM | POA: Diagnosis not present

## 2021-04-18 DIAGNOSIS — R531 Weakness: Secondary | ICD-10-CM | POA: Diagnosis not present

## 2021-04-18 DIAGNOSIS — M25612 Stiffness of left shoulder, not elsewhere classified: Secondary | ICD-10-CM | POA: Diagnosis not present

## 2021-04-25 DIAGNOSIS — R531 Weakness: Secondary | ICD-10-CM | POA: Diagnosis not present

## 2021-04-25 DIAGNOSIS — M25512 Pain in left shoulder: Secondary | ICD-10-CM | POA: Diagnosis not present

## 2021-04-25 DIAGNOSIS — M7542 Impingement syndrome of left shoulder: Secondary | ICD-10-CM | POA: Diagnosis not present

## 2021-04-25 DIAGNOSIS — M25612 Stiffness of left shoulder, not elsewhere classified: Secondary | ICD-10-CM | POA: Diagnosis not present

## 2021-04-26 NOTE — Progress Notes (Signed)
Carelink Summary Report / Loop Recorder 

## 2021-04-28 DIAGNOSIS — R531 Weakness: Secondary | ICD-10-CM | POA: Diagnosis not present

## 2021-04-28 DIAGNOSIS — M25512 Pain in left shoulder: Secondary | ICD-10-CM | POA: Diagnosis not present

## 2021-04-28 DIAGNOSIS — M25612 Stiffness of left shoulder, not elsewhere classified: Secondary | ICD-10-CM | POA: Diagnosis not present

## 2021-04-28 DIAGNOSIS — M7542 Impingement syndrome of left shoulder: Secondary | ICD-10-CM | POA: Diagnosis not present

## 2021-05-02 ENCOUNTER — Encounter: Payer: Self-pay | Admitting: Primary Care

## 2021-05-02 ENCOUNTER — Telehealth (INDEPENDENT_AMBULATORY_CARE_PROVIDER_SITE_OTHER): Payer: Medicare Other | Admitting: Primary Care

## 2021-05-02 DIAGNOSIS — R531 Weakness: Secondary | ICD-10-CM | POA: Diagnosis not present

## 2021-05-02 DIAGNOSIS — G4733 Obstructive sleep apnea (adult) (pediatric): Secondary | ICD-10-CM

## 2021-05-02 DIAGNOSIS — M7542 Impingement syndrome of left shoulder: Secondary | ICD-10-CM | POA: Diagnosis not present

## 2021-05-02 DIAGNOSIS — M25612 Stiffness of left shoulder, not elsewhere classified: Secondary | ICD-10-CM | POA: Diagnosis not present

## 2021-05-02 DIAGNOSIS — M25512 Pain in left shoulder: Secondary | ICD-10-CM | POA: Diagnosis not present

## 2021-05-02 NOTE — Progress Notes (Deleted)
$'@Patient'J$  ID: Colin [name], male    DOB: [birth year], 70 y.o.   MRN: [id number]  No chief complaint on file.   Referring provider: Antony Contras, MD  HPI:  70 year old male, never smoke. PMH significant for NSIP, COVID-19, nocturnal hypoxemia, recent cryptogenic stroke, hypertension, GERD and DDD. Patient of [name].  Hospitalized with COVID-19 pneumonia in September 2020. Parenchymal changes on CT. Autoimmune work-up negative. He was started on steroids in November 2020, initial improvement with some recurrent shortness of breath with tapper.   Previous [facility] pulmonary encounter: 03/15/2020 Patient presents today for 2 month follow-up/ oxygen check. Hx Covid pneumonia in September 2020. He was started or oral steroids in November. Initially had some improvement but experienced recurrence in shortness of breath with taper. Goal is to come off steroids completely. Prednisone tapered to '5mg'$  daily and continues Bactrim MWF. Repeat CT chest in June showed stable fibrosis of the lungs compared to January and likely d/t covid-19. No acute process, evidence of CAD and pulmonary HTN. He had an echocardiogram with Dr. Stanford Breed in February which showed normal LV function, mildly elevated pulmonary artery systolic pressure 0000000. He has been inactive d/t hip pain. He had surgery 4 weeks ago on right rotator cuff. He is scheduled for MRI ordered by orthopedics.  He has been seen by Rheumatology in the past for back pain. RF and ANA labs negative in November 2020. He reports back and bilateral hip pain. Neuropathy in both legs, worse in right. He is still wearing 2L oxygen at night, he does not feel like he needs it. Denies snoring or interruptions in his sleep. He has a follow up with Cardiology in late August. He has an apt with Neurology in August as well.   04/29/20- [name]  He has been off steroids since July. He has minimal shortness of breath. He had an overnight oximetry test demonstrating  hypoxemia so he has continued to wear his nightly oxygen. Denies difficulty concentrating or memory issues. He reports mobility issues with worsening cervical stenosis and recent TIA. He has fallen and injured his shoulder so has had right rotator cuff surgery this year. Reports he has lost 15-20lbs this summer. He continues to bicycle at home.   05/19/20- [name] No changes since our last telephone visit. Has minimal shortness of breath. He is scheduled for sleep study next week. Compliant with nightly oxygen. Remains as active at home as possible. His cervical stenosis does limit his activity.   08/27/2020 Patient presents today for 3 month follow-up. He had sleep study in September 2021 that showed severe obstructive sleep apnea, AHI 72.7/hr with SpO2 low 72%. Optimal PAP pressure was 16cm h20, he did not require nocturnal oxygen. He is still waiting on CPAP machine from [facility], there is a 2-3 month wait time for machines currently. Patient is requiring oxygen until he can be started CPAP.   Breathing has been fine. He doesn't exert himself much d/t back limitations. He does some exercising in the pool which can cause him to get his breathing up. Denies shortness of breath day to day.    04/01/2021- Interim  Patient presents today for 6 month follow-up. Initial sleep study in September 2021 that showed severe OSA with SpO2 low 74%, optimal pressure was 16cm h20. He did not require oxygen while on CPAP. In April patient started wearing CPAP. [name] ordered patient for BiPAP/ASV titration. He was ordered for BIPAP therapy 16/12 cm h20 with medium size resmed  full face mask. No supplemental oxygen required while on BIPAP. He has a hard time fallings asleep. Reports issues with airleak and dry mouth. He has humidified air. No issues with breathing or shortness of breath during the day.    Airview download 03/10/21-03/30/21 Usage 20/21 days (95%); 17 days (81%) > 4 hours Average usage days used 5  hours 11 mins Pressure Ipap 16; Epap 12 cm h20 Airleaks 40.1L/min (95%) AHI 6.6    Prednisone taper history 11/12 -12/1 Prednisone 50 mg QD 12/1-12/15 Prednisone 40 mg QD 12/15-1/5 Prednisone 30 mg QD 1/5-1/20 Prednisone 20 mg QD 1/20-2/17 Alternate 30 and 20 mg every other day 3/19-4/15 Alternative '20mg'$  and 10 mg every day 4/15-6/28 '5mg'$  QD 6/28-7/5 Alternate 5 mg every other day July 2021- STOPPED oral steriods    No Known Allergies  Immunization History  Administered Date(s) Administered   DTaP 02/15/2010   Influenza Split 06/14/2009, 08/02/2010, 07/24/2012, 06/20/2013   Influenza, High Dose Seasonal PF 08/29/2018, 06/16/2019   PFIZER(Purple Top)SARS-COV-2 Vaccination 11/13/2019, 12/11/2019   Pneumococcal Conjugate-13 04/30/2018   Pneumococcal Polysaccharide-23 06/18/2019   Tdap 02/15/2010, 05/11/2020   Zoster, Live 12/04/2012    Past Medical History:  Diagnosis Date   Arthritis    Barrett's esophagus    Bowel trouble    Stool leakage since colonscopy 02/2016   Cancer ([place])    skin cancer   Carpal tunnel syndrome, bilateral    Depression    in teenage years    GERD (gastroesophageal reflux disease)    History of COVID-19    Hyperlipemia    Hypertension    Hypothyroidism    Interstitial lung disease ([place])    OSA treated with BiPAP 04/01/2021   Peripheral neuropathy    Pneumonia    Shortness of breath dyspnea    with bending over    Stroke ([place]) 03/2020   cryptogenic   TIA (transient ischemic attack) 03/2020   Wears glasses     Tobacco History: Social History   Tobacco Use  Smoking Status Never  Smokeless Tobacco Never   Counseling given: Not Answered   Outpatient Medications Prior to Visit  Medication Sig Dispense Refill   acetaminophen (TYLENOL) 500 MG tablet Take 1,000 mg by mouth every 6 (six) hours as needed for mild pain or moderate pain.     clopidogrel (PLAVIX) 75 MG tablet Take 75 mg by mouth daily.     levothyroxine (SYNTHROID) 75 MCG  tablet Take 75 mcg by mouth daily.      losartan-hydrochlorothiazide (HYZAAR) 50-12.5 MG tablet Take 0.5 tablets by mouth daily.     Omega-3 Fatty Acids (FISH OIL) 1000 MG CAPS Take 1,000 mg by mouth daily.      pantoprazole (PROTONIX) 20 MG tablet Take 20 mg by mouth daily.     rosuvastatin (CRESTOR) 20 MG tablet Take 1 tablet (20 mg total) by mouth daily. 90 tablet 1   No facility-administered medications prior to visit.      Review of Systems  Review of Systems   Physical Exam  There were no vitals taken for this visit. Physical Exam   Lab Results:  CBC    Component Value Date/Time   WBC 5.6 03/25/2020 0309   RBC 4.07 (L) 03/25/2020 0309   HGB 12.2 (L) 03/25/2020 0309   HCT 37.9 (L) 03/25/2020 0309   PLT 182 03/25/2020 0309   MCV 93.1 03/25/2020 0309   MCH 30.0 03/25/2020 0309   MCHC 32.2 03/25/2020 0309   RDW 13.1 03/25/2020 0309  LYMPHSABS 1.6 03/23/2020 1617   MONOABS 0.6 03/23/2020 1617   EOSABS 0.3 03/23/2020 1617   BASOSABS 0.1 03/23/2020 1617    BMET    Component Value Date/Time   NA 139 03/25/2020 0309   K 3.9 03/25/2020 0309   CL 105 03/25/2020 0309   CO2 24 03/25/2020 0309   GLUCOSE 116 (H) 03/25/2020 0309   BUN 17 03/25/2020 0309   CREATININE 0.99 03/25/2020 0309   CALCIUM 9.6 03/25/2020 0309   GFRNONAA >60 03/25/2020 0309   GFRAA >60 03/25/2020 0309    BNP No results found for: BNP  ProBNP No results found for: PROBNP  Imaging: CUP PACEART REMOTE DEVICE CHECK  Result Date: 04/05/2021 ILR summary report received. Battery status OK. Normal device function. No new symptom, tachy, brady, or pause episodes. No new AF episodes. Monthly summary reports and ROV/PRN LR    Assessment & Plan:   No problem-specific Assessment & Plan notes found for this encounter.     Martyn Ehrich, NP 05/02/2021

## 2021-05-02 NOTE — Patient Instructions (Signed)
No changes  Continue to wear BIPAP every night 4-6 hours or longer  Follow up in November with [name] (recalled placed, if you have not heard anything from our office by end of October please call to make apt)

## 2021-05-02 NOTE — Progress Notes (Signed)
Virtual Visit via Telephone Note  I connected with Colin [name] on 05/02/21 at  9:30 AM EDT by telephone and verified that I am speaking with the correct person using two identifiers.  Location: Patient: Home Provider: Office    I discussed the limitations, risks, security and privacy concerns of performing an evaluation and management service by telephone and the availability of in person appointments. I also discussed with the patient that there may be a patient responsible charge related to this service. The patient expressed understanding and agreed to proceed.   History of Present Illness:  70 year old male, never smoke. PMH significant for NSIP, COVID-19, nocturnal hypoxemia, recent cryptogenic stroke, hypertension, GERD and DDD. Patient of Dr. Loanne [name].  Hospitalized with COVID-19 pneumonia in September 2020. Parenchymal changes on CT. Autoimmune work-up negative. He was started on steroids in November 2020, initial improvement with some recurrent shortness of breath with tapper.   05/26/20 Split night sleep study- Severe obstructive sleep apnea occurred during the diagnostic portion of the study (AHI = 72.7/hour). An optimal PAP pressure was selected for this patient (16 cm of water).   Previous [facility] pulmonary encounter: 03/15/2020 Patient presents today for 2 month follow-up/ oxygen check. Hx Covid pneumonia in September 2020. He was started or oral steroids in November. Initially had some improvement but experienced recurrence in shortness of breath with taper. Goal is to come off steroids completely. Prednisone tapered to '5mg'$  daily and continues Bactrim MWF. Repeat CT chest in June showed stable fibrosis of the lungs compared to January and likely d/t covid-19. No acute process, evidence of CAD and pulmonary HTN. He had an echocardiogram with Dr. Stanford [name] in February which showed normal LV function, mildly elevated pulmonary artery systolic pressure 0000000. He has been inactive d/t hip  pain. He had surgery 4 weeks ago on right rotator cuff. He is scheduled for MRI ordered by orthopedics.  He has been seen by Rheumatology in the past for back pain. RF and ANA labs negative in November 2020. He reports back and bilateral hip pain. Neuropathy in both legs, worse in right. He is still wearing 2L oxygen at night, he does not feel like he needs it. Denies snoring or interruptions in his sleep. He has a follow up with Cardiology in late August. He has an apt with Neurology in August as well.   04/29/20- Dr. Loanne [name]  He has been off steroids since July. He has minimal shortness of breath. He had an overnight oximetry test demonstrating hypoxemia so he has continued to wear his nightly oxygen. Denies difficulty concentrating or memory issues. He reports mobility issues with worsening cervical stenosis and recent TIA. He has fallen and injured his shoulder so has had right rotator cuff surgery this year. Reports he has lost 15-20lbs this summer. He continues to bicycle at home.   05/19/20- Dr. Loanne [name] No changes since our last telephone visit. Has minimal shortness of breath. He is scheduled for sleep study next week. Compliant with nightly oxygen. Remains as active at home as possible. His cervical stenosis does limit his activity.   08/27/2020 Patient presents today for 3 month follow-up. He had sleep study in September 2021 that showed severe obstructive sleep apnea, AHI 72.7/hr with SpO2 low 72%. Optimal PAP pressure was 16cm h20, he did not require nocturnal oxygen. He is still waiting on CPAP machine from [facility], there is a 2-3 month wait time for machines currently. Patient is requiring oxygen until he can be started CPAP.  Breathing has been fine. He doesn't exert himself much d/t back limitations. He does some exercising in the pool which can cause him to get his breathing up. Denies shortness of breath day to day.    04/01/2021 Patient presents today for 6 month follow-up. Initial sleep  study in September 2021 that showed severe OSA with SpO2 low 74%, optimal pressure was 16cm h20. He did not require oxygen while on CPAP. In April patient started wearing CPAP. Dr. Loanne [name] ordered patient for BiPAP/ASV titration. He was ordered for BIPAP therapy 16/12 cm h20 with medium size resmed full face mask. No supplemental oxygen required while on BIPAP. He has a hard time falling asleep. Reports issues with airleak and dry mouth. He has humidified air. No issues with breathing or shortness of breath during the day.   He is more comfortable. He is sleeping well at night. He still does not like wearing PAP at night. He still gets tired around 2pm. He has not noticed a drastic difference in terms of energy level. He has a back issue, he has severe scoliosis and spinal stenosis. Surgery is too high of a risk.   His breathing is described as being fine. [facility] and [place] his stationary bike at home. He is not on any maintenance inhalers. Last PFTs were in September 2021.   Airview download 03/10/21-03/30/21 Usage 20/21 days (95%); 17 days (81%) > 4 hours Average usage days used 5 hours 11 mins Pressure Ipap 16; Epap 12 cm h20 Airleaks 40.1L/min (95%) AHI 6.6   Airview download 03/30/21-04/28/21 Usage 30/30 days (100%) > 4 HOURS Pressure IPAP 14; EPAP 10 cm h20  Airleaks 13.2L/min (95%) Apnea index- central 8.6; obstructive 1.2 AHI 12.2  Prednisone taper history 11/12 -12/1 Prednisone 50 mg QD 12/1-12/15 Prednisone 40 mg QD 12/15-1/5 Prednisone 30 mg QD 1/5-1/20 Prednisone 20 mg QD 1/20-2/17 Alternate 30 and 20 mg every other day 3/19-4/15 Alternative '20mg'$  and 10 mg every day 4/15-6/28 '5mg'$  QD 6/28-7/5 Alternate 5 mg every other day July 2021- STOPPED oral steriods     Observations/Objective:  - Able to speak in full sentences; No overt shortness of breath, wheezing or cough   Assessment and Plan:  OSA: - Split night sleep study in Sept 2021 showed severe OSA; (AHI =  72.7/hour). An optimal PAP pressure was selected for this patient (16 cm of water). BiPAP/ASV titration 16/12 cm h20 with medium size resmed full face mask. No supplemental oxygen required while on BIPAP - He has been having some difficulty getting used to BiPAP mask. During our last visit he reports having a lot of air leaks. He lowered his pressure to 14/10cm h20 which helped him to feel more comfortable and reports less airleaks. His AHI is somewhat elevated, but overall significantly improved compared to his original sleep study. We can consider increased pressure at next follow-up if still having some residual events and he gets more comfortable wearing PAP therapy.  - Follow up in 3 months with Dr. Loanne [name]  NSIP: - Stropped oral steroids in July 2021. No clinical symptoms - CTA in January 2022 showed similar pattern of chronic lung changes, noted on prior HRCT in January 2021. Right sided lung nodules unchanged, felt to be benign    Follow Up Instructions:  - 3 months with Dr. Loanne [name]    I discussed the assessment and treatment plan with the patient. The patient was provided an opportunity to ask questions and all were answered. The patient agreed  with the plan and demonstrated an understanding of the instructions.   The patient was advised to call back or seek an in-person evaluation if the symptoms worsen or if the condition fails to improve as anticipated.  I provided 25 minutes of non-face-to-face time during this encounter.   Martyn Ehrich, NP

## 2021-05-05 ENCOUNTER — Ambulatory Visit (INDEPENDENT_AMBULATORY_CARE_PROVIDER_SITE_OTHER): Payer: Medicare Other

## 2021-05-05 DIAGNOSIS — L0100 Impetigo, unspecified: Secondary | ICD-10-CM | POA: Diagnosis not present

## 2021-05-05 DIAGNOSIS — I8311 Varicose veins of right lower extremity with inflammation: Secondary | ICD-10-CM | POA: Diagnosis not present

## 2021-05-05 DIAGNOSIS — L309 Dermatitis, unspecified: Secondary | ICD-10-CM | POA: Diagnosis not present

## 2021-05-05 DIAGNOSIS — I639 Cerebral infarction, unspecified: Secondary | ICD-10-CM | POA: Diagnosis not present

## 2021-05-05 DIAGNOSIS — I872 Venous insufficiency (chronic) (peripheral): Secondary | ICD-10-CM | POA: Diagnosis not present

## 2021-05-05 DIAGNOSIS — L0889 Other specified local infections of the skin and subcutaneous tissue: Secondary | ICD-10-CM | POA: Diagnosis not present

## 2021-05-05 DIAGNOSIS — I8312 Varicose veins of left lower extremity with inflammation: Secondary | ICD-10-CM | POA: Diagnosis not present

## 2021-05-06 DIAGNOSIS — M7542 Impingement syndrome of left shoulder: Secondary | ICD-10-CM | POA: Diagnosis not present

## 2021-05-06 DIAGNOSIS — M25512 Pain in left shoulder: Secondary | ICD-10-CM | POA: Diagnosis not present

## 2021-05-06 DIAGNOSIS — M25612 Stiffness of left shoulder, not elsewhere classified: Secondary | ICD-10-CM | POA: Diagnosis not present

## 2021-05-06 DIAGNOSIS — R531 Weakness: Secondary | ICD-10-CM | POA: Diagnosis not present

## 2021-05-09 DIAGNOSIS — M25612 Stiffness of left shoulder, not elsewhere classified: Secondary | ICD-10-CM | POA: Diagnosis not present

## 2021-05-09 DIAGNOSIS — R531 Weakness: Secondary | ICD-10-CM | POA: Diagnosis not present

## 2021-05-09 DIAGNOSIS — M7542 Impingement syndrome of left shoulder: Secondary | ICD-10-CM | POA: Diagnosis not present

## 2021-05-09 DIAGNOSIS — M25512 Pain in left shoulder: Secondary | ICD-10-CM | POA: Diagnosis not present

## 2021-05-09 LAB — CUP PACEART REMOTE DEVICE CHECK
Date Time Interrogation Session: 20220821144802
Implantable Pulse Generator Implant Date: 20210708

## 2021-05-12 DIAGNOSIS — M25512 Pain in left shoulder: Secondary | ICD-10-CM | POA: Diagnosis not present

## 2021-05-12 DIAGNOSIS — M25612 Stiffness of left shoulder, not elsewhere classified: Secondary | ICD-10-CM | POA: Diagnosis not present

## 2021-05-12 DIAGNOSIS — M7542 Impingement syndrome of left shoulder: Secondary | ICD-10-CM | POA: Diagnosis not present

## 2021-05-12 DIAGNOSIS — R531 Weakness: Secondary | ICD-10-CM | POA: Diagnosis not present

## 2021-05-16 DIAGNOSIS — L309 Dermatitis, unspecified: Secondary | ICD-10-CM | POA: Diagnosis not present

## 2021-05-16 DIAGNOSIS — B353 Tinea pedis: Secondary | ICD-10-CM | POA: Diagnosis not present

## 2021-05-17 DIAGNOSIS — R531 Weakness: Secondary | ICD-10-CM | POA: Diagnosis not present

## 2021-05-17 DIAGNOSIS — M7542 Impingement syndrome of left shoulder: Secondary | ICD-10-CM | POA: Diagnosis not present

## 2021-05-17 DIAGNOSIS — M25512 Pain in left shoulder: Secondary | ICD-10-CM | POA: Diagnosis not present

## 2021-05-17 DIAGNOSIS — M25612 Stiffness of left shoulder, not elsewhere classified: Secondary | ICD-10-CM | POA: Diagnosis not present

## 2021-05-19 DIAGNOSIS — M25612 Stiffness of left shoulder, not elsewhere classified: Secondary | ICD-10-CM | POA: Diagnosis not present

## 2021-05-19 DIAGNOSIS — R531 Weakness: Secondary | ICD-10-CM | POA: Diagnosis not present

## 2021-05-19 DIAGNOSIS — M7542 Impingement syndrome of left shoulder: Secondary | ICD-10-CM | POA: Diagnosis not present

## 2021-05-19 DIAGNOSIS — M25512 Pain in left shoulder: Secondary | ICD-10-CM | POA: Diagnosis not present

## 2021-05-20 DIAGNOSIS — M25512 Pain in left shoulder: Secondary | ICD-10-CM | POA: Diagnosis not present

## 2021-05-20 DIAGNOSIS — M25511 Pain in right shoulder: Secondary | ICD-10-CM | POA: Diagnosis not present

## 2021-05-24 ENCOUNTER — Telehealth: Payer: Self-pay

## 2021-05-24 NOTE — Progress Notes (Signed)
Carelink Summary Report / Loop Recorder 

## 2021-05-24 NOTE — Telephone Encounter (Signed)
We received a clearance request from [facility] requesting a hold on patient's Plavix. This was approved and signed by Dr. Leonie Man and faxed back to [phone].   Dr. Leonie Man stated it was ok to hold the plavix 3-5 days prior to colonoscopy and to restart after the procedure "when safe."   Form given to medical records to be scanned into chart.

## 2021-05-26 DIAGNOSIS — G4731 Primary central sleep apnea: Secondary | ICD-10-CM | POA: Diagnosis not present

## 2021-05-26 DIAGNOSIS — Z0001 Encounter for general adult medical examination with abnormal findings: Secondary | ICD-10-CM | POA: Diagnosis not present

## 2021-05-26 DIAGNOSIS — M5136 Other intervertebral disc degeneration, lumbar region: Secondary | ICD-10-CM | POA: Diagnosis not present

## 2021-05-26 DIAGNOSIS — E782 Mixed hyperlipidemia: Secondary | ICD-10-CM | POA: Diagnosis not present

## 2021-05-26 DIAGNOSIS — M4800 Spinal stenosis, site unspecified: Secondary | ICD-10-CM | POA: Diagnosis not present

## 2021-05-26 DIAGNOSIS — I10 Essential (primary) hypertension: Secondary | ICD-10-CM | POA: Diagnosis not present

## 2021-05-26 DIAGNOSIS — Z6832 Body mass index (BMI) 32.0-32.9, adult: Secondary | ICD-10-CM | POA: Diagnosis not present

## 2021-05-26 DIAGNOSIS — E039 Hypothyroidism, unspecified: Secondary | ICD-10-CM | POA: Diagnosis not present

## 2021-05-26 DIAGNOSIS — M1991 Primary osteoarthritis, unspecified site: Secondary | ICD-10-CM | POA: Diagnosis not present

## 2021-05-26 DIAGNOSIS — Z1331 Encounter for screening for depression: Secondary | ICD-10-CM | POA: Diagnosis not present

## 2021-05-26 DIAGNOSIS — E6609 Other obesity due to excess calories: Secondary | ICD-10-CM | POA: Diagnosis not present

## 2021-05-26 DIAGNOSIS — G629 Polyneuropathy, unspecified: Secondary | ICD-10-CM | POA: Diagnosis not present

## 2021-05-26 DIAGNOSIS — Z0000 Encounter for general adult medical examination without abnormal findings: Secondary | ICD-10-CM | POA: Diagnosis not present

## 2021-05-26 DIAGNOSIS — R7309 Other abnormal glucose: Secondary | ICD-10-CM | POA: Diagnosis not present

## 2021-06-06 ENCOUNTER — Ambulatory Visit: Payer: Medicare Other

## 2021-06-13 LAB — CUP PACEART REMOTE DEVICE CHECK
Date Time Interrogation Session: 20220923144715
Implantable Pulse Generator Implant Date: 20210708

## 2021-06-22 ENCOUNTER — Telehealth: Payer: Self-pay

## 2021-06-22 ENCOUNTER — Telehealth: Payer: Self-pay | Admitting: *Deleted

## 2021-06-22 DIAGNOSIS — M25511 Pain in right shoulder: Secondary | ICD-10-CM | POA: Diagnosis not present

## 2021-06-22 DIAGNOSIS — M25811 Other specified joint disorders, right shoulder: Secondary | ICD-10-CM | POA: Diagnosis not present

## 2021-06-22 NOTE — Telephone Encounter (Signed)
   [facility] Pre-operative Risk Assessment    Patient Name: Colin [name]  DOB: [birth year] MRN: [id number]    Request for surgical clearance:  What type of surgery is being performed Right reverse shoulder arthroplasty   When is this surgery scheduled  TBD  What type of clearance is required Both  Are there any medications that need to be held prior to surgery and how long   Plavix  Practice name and name of physician performing surgery  [facility]  Dr.Kevin Supple  What is the office phone number              [phone]   7.   What is the office fax number        [phone]  8.   Anesthesia type  General   Kathyrn Lass 06/22/2021, 3:07 PM  _________________________________________________________________   (provider comments below)

## 2021-06-22 NOTE — Telephone Encounter (Signed)
Called pt and scheduled appt 10-25 for cardiac clearance. He will arrive early

## 2021-06-22 NOTE — Telephone Encounter (Signed)
   Name: GAGANDEEP PETTET  DOB: 1951-08-24  MRN: [id number]  Primary Cardiologist: None  Chart reviewed as part of pre-operative protocol coverage. Because of Jeren Dufrane Griep's past medical history and time since last visit, he will require a follow-up visit in order to better assess preoperative cardiovascular risk.  Last OV is >1 year ago in 04/2020.  Pre-op covering staff: - Please schedule appointment and call patient to inform them. If patient already had an upcoming appointment within acceptable timeframe, please add "pre-op clearance" to the appointment notes so provider is aware. - Please contact requesting surgeon's office via preferred method (i.e, phone, fax) to inform them of need for appointment prior to surgery.  Will hold off routing antiplatelet question to MD as I do not see any cardiac contraindications to holding at this time, but can be finalized at time of follow-up - has history of TIA but this can be deferred to primary care if indicated at time of OV.  Charlie Pitter, PA-C  06/22/2021, 4:30 PM

## 2021-06-22 NOTE — Telephone Encounter (Signed)
Received surgery clearance form from [facility] (Dr. Onnie Graham) for a right reverse shoulder athroplasty under general anesthesia.   Dr. Leonie Man completed form with the following instructions:  "Okay to hold Plavix for 5 days prior to procedure and restart after with a small but acceptable peri-procedural risk of TIA/stroke, if the patient is willing."  Clearance faxed back to [facility] at [phone], attention [place].  Copy of form sent to be scanned.

## 2021-07-10 NOTE — Progress Notes (Signed)
Cardiology Clinic Note   Patient Name: Colin [name] Date of Encounter: 07/12/2021  Primary Care Provider:  Antony Contras, MD Primary Cardiologist:  Thompson Grayer, MD  Patient Profile    Colin [name] 70 year old male presents the clinic today for follow-up evaluation of his hypertension, hyperlipidemia, and preoperative cardiac evaluation.  Past Medical History    Past Medical History:  Diagnosis Date   Arthritis    Barrett's esophagus    Bowel trouble    Stool leakage since colonscopy 02/2016   Cancer (HCC)    skin cancer   Carpal tunnel syndrome, bilateral    Depression    in teenage years    GERD (gastroesophageal reflux disease)    History of COVID-19    Hyperlipemia    Hypertension    Hypothyroidism    Interstitial lung disease (HCC)    OSA treated with BiPAP 04/01/2021   Peripheral neuropathy    Pneumonia    Shortness of breath dyspnea    with bending over    Stroke ([place]) 03/2020   cryptogenic   TIA (transient ischemic attack) 03/2020   Wears glasses    Past Surgical History:  Procedure Laterality Date   APPENDECTOMY     CARPAL TUNNEL RELEASE  04/23/2012   Procedure: CARPAL TUNNEL RELEASE;  Surgeon: Cammie Sickle., MD;  Location: [facility];  Service: Orthopedics;  Laterality: Left;   CARPAL TUNNEL RELEASE  06/27/2012   Procedure: CARPAL TUNNEL RELEASE;  Surgeon: Cammie Sickle., MD;  Location: [facility];  Service: Orthopedics;  Laterality: Right;   CERVICAL DISCECTOMY  2007   colonscopy     polyps removed / benign   HERNIA REPAIR     INCISIONAL HERNIA REPAIR  2011   subcostal from append   JOINT REPLACEMENT  81,87   lt total hip   LAPAROSCOPIC APPENDECTOMY  2010   required open repair   LOOP RECORDER INSERTION N/A 03/25/2020   Procedure: LOOP RECORDER INSERTION;  Surgeon: Thompson Grayer, MD;  Location: [facility];  Service: Cardiovascular;  Laterality: N/A;   SHOULDER ARTHROSCOPY WITH ROTATOR CUFF  REPAIR Right 02/12/2020   Procedure: SHOULDER ARTHROSCOPY WITH ROTATOR CUFF REPAIR with subacromial decompression distal clavicle resection biceps tenotomy labral debridement;  Surgeon: Sydnee Cabal, MD;  Location: [facility];  Service: Orthopedics;  Laterality: Right;  interscalene block   TONSILLECTOMY     TOTAL HIP ARTHROPLASTY     x3 left hip [phone]   TOTAL HIP ARTHROPLASTY Right 04/05/2016   Procedure: RIGHT TOTAL HIP ARTHROPLASTY ANTERIOR APPROACH;  Surgeon: Gaynelle Arabian, MD;  Location: [facility];  Service: Orthopedics;  Laterality: Right;    Allergies  No Known Allergies  History of Present Illness    Colin [name] has a PMH of thoracic aortic aneurysm, coronary artery disease, HLD, HTN, and COVID-19 infection.  His CT chest 1/21 showed stable UIP, small pulmonary nodules, 4 cm aortic root dilation, aortic atherosclerosis, and three-vessel coronary artery disease.  His changes that were seen on his CT were felt to be related to his previous COVID-19 infection.  On his follow-up evaluation with Dr. Stanford Breed he reported difficulties with dyspnea since having COVID-19.  A follow-up nuclear stress test 2/21 showed an ejection fraction of 66% and no ischemia or infarction.  He had a CVA 7/21.  His echocardiogram 7/21 showed normal LV function, negative microcavitation study.  He underwent implantation of loop recorder at that time.  He was last seen  by Dr. Stanford Breed on 04/22/2020.  He denied increased dyspnea, exertional chest pain and syncope.  He presents to the clinic today for follow-up evaluation and preoperative cardiac evaluation.  He states he feels well.  He does note some residual left hand weakness that may be left over from his CVA versus arthritis.  He stays physically active doing water exercises for fibromyalgia 3 times per week and doing stationary bike on off days.  He did has been following a heart healthy low-sodium diet and does not add sodium to his food.  We reviewed his  previous stress testing and current shoulder ailments.  He reports that his left shoulder is somewhat painful however, he is having his right shoulder operated on due to functional limitations.  I will give him a salty 6 diet sheet, have him continue his physical activity, send his preoperative cardiac evaluation, and plan follow-up for 1 year.  Today he denies chest pain, shortness of breath, lower extremity edema, fatigue, palpitations, melena, hematuria, hemoptysis, diaphoresis, weakness, presyncope, syncope, orthopnea, and PND.   Home Medications    Prior to Admission medications   Medication Sig Start Date End Date Taking? Authorizing Provider  acetaminophen (TYLENOL) 500 MG tablet Take 1,000 mg by mouth every 6 (six) hours as needed for mild pain or moderate pain.    [provider]  clopidogrel (PLAVIX) 75 MG tablet Take 75 mg by mouth daily. 06/22/20   [provider]  levothyroxine (SYNTHROID) 75 MCG tablet Take 75 mcg by mouth daily.     [provider]  losartan-hydrochlorothiazide (HYZAAR) 50-12.5 MG tablet Take 0.5 tablets by mouth daily.    [provider]  Omega-3 Fatty Acids (FISH OIL) 1000 MG CAPS Take 1,000 mg by mouth daily.     [provider]  pantoprazole (PROTONIX) 20 MG tablet Take 20 mg by mouth daily.    [provider]  rosuvastatin (CRESTOR) 20 MG tablet Take 1 tablet (20 mg total) by mouth daily. 02/10/20   Lelon Perla, MD    Family History    Family History  Problem Relation Age of Onset   Breast cancer Mother    Hypertension Mother    Heart disease Father    Lung cancer Father    Hypertension Father    Breast cancer Sister    Stroke Maternal Grandmother    He indicated that his mother is deceased. He indicated that his father is deceased. He indicated that his sister is deceased. He indicated that the status of his maternal grandmother is unknown.  Social History    Social History    Socioeconomic History   Marital status: Married    Spouse name: Dorothy   Number of children: 3   Years of education: Not on file   Highest education level: Not on file  Occupational History   Occupation: IT sales professional: [employer]  Tobacco Use   Smoking status: Never   Smokeless tobacco: Never  Vaping Use   Vaping Use: Never used  Substance and Sexual Activity   Alcohol use: Yes    Comment: ocassionally   Drug use: No   Sexual activity: Not on file  Other Topics Concern   Not on file  Social History Narrative   Lives with wife   Social Determinants of Health   Financial Resource Strain: Not on file  Food Insecurity: Not on file  Transportation Needs: Not on file  Physical Activity: Not on file  Stress: Not on file  Social Connections: Not on file  Intimate Partner Violence: Not on file     Review of Systems    General:  No chills, fever, night sweats or weight changes.  Cardiovascular:  No chest pain, dyspnea on exertion, edema, orthopnea, palpitations, paroxysmal nocturnal dyspnea. Dermatological: No rash, lesions/masses Respiratory: No cough, dyspnea Urologic: No hematuria, dysuria Abdominal:   No nausea, vomiting, diarrhea, bright red blood per rectum, melena, or hematemesis Neurologic:  No visual changes, wkns, changes in mental status. All other systems reviewed and are otherwise negative except as noted above.  Physical Exam    VS:  BP 116/76   Pulse (!) 54   Ht 6\' 1"  (1.854 m)   Wt 255 lb 9.6 oz (115.9 kg)   SpO2 99%   BMI 33.72 kg/m  , BMI Body mass index is 33.72 kg/m. GEN: Well nourished, well developed, in no acute distress. HEENT: normal. Neck: Supple, no JVD, carotid bruits, or masses. Cardiac: RRR, no murmurs, rubs, or gallops. No clubbing, cyanosis, edema.  Radials/DP/PT 2+ and equal bilaterally.  Respiratory:  Respirations regular and unlabored, clear to auscultation bilaterally. GI: Soft, nontender,  nondistended, BS + x 4. MS: no deformity or atrophy. Skin: warm and dry, no rash. Neuro:  Strength and sensation are intact. Psych: Normal affect.  Accessory Clinical Findings    Recent Labs: No results found for requested labs within last 8760 hours.   Recent Lipid Panel    Component Value Date/Time   CHOL 139 03/24/2020 0425   TRIG 277 (H) 03/24/2020 0425   HDL 34 (L) 03/24/2020 0425   CHOLHDL 4.1 03/24/2020 0425   VLDL 55 (H) 03/24/2020 0425   LDLCALC 50 03/24/2020 0425    ECG personally reviewed by me today-sinus bradycardia with possible premature atrial complexes with aberrant conduction 54 bpm- No acute changes  Echocardiogram 10/27/2019  IMPRESSIONS     1. Left ventricular ejection fraction, by estimation, is 55 to 60%. The  left ventricle has normal function. The average left ventricular global  longitudinal strain is -17.8 %.   2. Right ventricular systolic function is normal. There is mildly  elevated pulmonary artery systolic pressure.   3. The aortic valve is tricuspid.   Nuclear stress test 10/30/2019  Nuclear stress EF: 66%. No wall motion abnormalities There was no ST segment deviation noted during stress. The study is normal. No perfusion defects suggestive of ischemia or infarct. This is a low risk study.   Candee Furbish, MD Assessment & Plan   1.  Coronary artery disease-no recent episodes of arm neck back or chest discomfort.  Dyspnea stable.  Remains physically active.  Stress test 10/30/2019 showed no ischemia and an EF of 66%. Continue Plavix, rosuvastatin Heart healthy low-sodium diet-salty 6 given Increase physical activity as tolerated  Hyperlipidemia-LDL 50 on 03/24/2020 Continue Plavix, rosuvastatin Heart healthy low-sodium high-fiber diet. 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 Repeat CT angio chest 1/23  Dyspnea-somewhat  improved.  Previously felt to be related to COVID-19 infection and postinflammatory lung disease. Follows with pulmonology  CVA-neurologically intact.  Denies any recent vision changes, weakness, neurological sequela.  CVA 7/21 Follows with PCP  Preoperative cardiac evaluation-right reverse shoulder arthroplasty [facility]; Dr. Justice Britain, fax #[phone]  Primary Cardiologist: Thompson Grayer, MD  Chart reviewed as part of pre-operative protocol coverage. Given past medical history and time since last visit, based  on ACC/AHA guidelines, Colin [name] would be at acceptable risk for the planned procedure without further cardiovascular testing.   Patient's Plavix is prescribed by his neurologist.  He will need to receive recommendations on Plavix hold from prescribing provider.  Patient was advised that if he develops new symptoms prior to surgery to contact our office to arrange a follow-up appointment.  He verbalized understanding.  I will route this recommendation to the requesting party via Epic fax function and remove from pre-op pool.  Please call with questions   Disposition: Follow-up with Dr. Stanford Breed or me in 1 year.  Jossie Ng. Charlie Char NP-C    07/12/2021, 10:16 AM [place] [address] [address] Office [phone] Fax [phone]  Notice: This dictation was prepared with Dragon dictation along with smaller phrase technology. Any transcriptional errors that result from this process are unintentional and may not be corrected upon review.  I spent 14 minutes examining this patient, reviewing medications, and using patient centered shared decision making involving her cardiac care.  Prior to her visit I spent greater than 20 minutes reviewing her past medical history,  medications, and prior cardiac tests.

## 2021-07-12 ENCOUNTER — Ambulatory Visit (INDEPENDENT_AMBULATORY_CARE_PROVIDER_SITE_OTHER): Payer: Medicare Other | Admitting: General Practice

## 2021-07-12 ENCOUNTER — Other Ambulatory Visit: Payer: Self-pay

## 2021-07-12 ENCOUNTER — Encounter: Payer: Self-pay | Admitting: General Practice

## 2021-07-12 VITALS — BP 116/76 | HR 54 | Ht 73.0 in | Wt 255.6 lb

## 2021-07-12 DIAGNOSIS — I712 Thoracic aortic aneurysm, without rupture, unspecified: Secondary | ICD-10-CM

## 2021-07-12 DIAGNOSIS — E7800 Pure hypercholesterolemia, unspecified: Secondary | ICD-10-CM | POA: Diagnosis not present

## 2021-07-12 DIAGNOSIS — I77810 Thoracic aortic ectasia: Secondary | ICD-10-CM

## 2021-07-12 DIAGNOSIS — R0609 Other forms of dyspnea: Secondary | ICD-10-CM | POA: Diagnosis not present

## 2021-07-12 DIAGNOSIS — I2510 Atherosclerotic heart disease of native coronary artery without angina pectoris: Secondary | ICD-10-CM

## 2021-07-12 DIAGNOSIS — Z01810 Encounter for preprocedural cardiovascular examination: Secondary | ICD-10-CM | POA: Diagnosis not present

## 2021-07-12 DIAGNOSIS — I639 Cerebral infarction, unspecified: Secondary | ICD-10-CM | POA: Diagnosis not present

## 2021-07-12 NOTE — Patient Instructions (Addendum)
Medication Instructions:  The current medical regimen is effective;  continue present plan and medications as directed. Please refer to the Current Medication list given to you today.  *If you need a refill on your cardiac medications before your next appointment, please call your pharmacy*  Lab Work: NONE If you have labs (blood work) drawn today and your tests are completely normal, you will receive your results only by:  [place] (if you have MyChart) OR A paper copy in the mail.  If you have any lab test that is abnormal or we need to change your treatment, we will call you to review the results. You may go to any [facility] that is convenient for you however, we do have a lab in our office that is able to assist you. You DO NOT need an appointment for our lab. The lab is open 8:00am and closes at 4:00pm. Lunch 12:45 - 1:45pm.  Testing/Procedures: CT ANGIO DUE IN JANUARY 2023  Special Instructions RELEASED FOR SURGERY-WE WILL LET THEM KNOW TODAY  PLEASE READ AND FOLLOW SALTY 6-ATTACHED-1,800 mg daily  PLEASE MAINTAIN PHYSICAL ACTIVITY AS TOLERATED  Follow-Up: Your next appointment:  12 month(s) In Person with You may see DR Stanford Breed or one of the following Advanced Practice Providers on your designated Care Team:   Sande Rives, PA-C Coletta Memos, FNP   Please call our office 2 months in advance to schedule this appointment   At [facility], you and your health needs are our priority.  As part of our continuing mission to provide you with exceptional heart care, we have created designated Provider Care Teams.  These Care Teams include your primary Cardiologist (physician) and Advanced Practice Providers (APPs -  Physician Assistants and Nurse Practitioners) who all work together to provide you with the care you need, when you need it.

## 2021-07-13 DIAGNOSIS — M4125 Other idiopathic scoliosis, thoracolumbar region: Secondary | ICD-10-CM | POA: Diagnosis not present

## 2021-07-13 DIAGNOSIS — G8929 Other chronic pain: Secondary | ICD-10-CM | POA: Diagnosis not present

## 2021-07-14 ENCOUNTER — Ambulatory Visit (INDEPENDENT_AMBULATORY_CARE_PROVIDER_SITE_OTHER): Payer: Medicare Other

## 2021-07-14 DIAGNOSIS — I639 Cerebral infarction, unspecified: Secondary | ICD-10-CM

## 2021-07-15 DIAGNOSIS — Z23 Encounter for immunization: Secondary | ICD-10-CM | POA: Diagnosis not present

## 2021-07-15 LAB — CUP PACEART REMOTE DEVICE CHECK
Date Time Interrogation Session: 20221026144638
Implantable Pulse Generator Implant Date: 20210708

## 2021-07-22 ENCOUNTER — Ambulatory Visit (INDEPENDENT_AMBULATORY_CARE_PROVIDER_SITE_OTHER): Payer: Medicare Other | Admitting: Pulmonary Disease

## 2021-07-22 ENCOUNTER — Other Ambulatory Visit: Payer: Self-pay

## 2021-07-22 ENCOUNTER — Encounter: Payer: Self-pay | Admitting: Pulmonary Disease

## 2021-07-22 VITALS — BP 122/80 | HR 46 | Temp 97.7°F | Ht 73.0 in | Wt 248.0 lb

## 2021-07-22 DIAGNOSIS — G4733 Obstructive sleep apnea (adult) (pediatric): Secondary | ICD-10-CM | POA: Diagnosis not present

## 2021-07-22 NOTE — Progress Notes (Signed)
Carelink Summary Report / Loop Recorder 

## 2021-07-22 NOTE — Progress Notes (Signed)
Subjective:   PATIENT ID: Colin [name] GENDER: male DOB: Apr 13, 1951, MRN: [id number]   HPI  Chief Complaint  Patient presents with   Follow-up    OSA   Reason for Visit: PFT follow-up  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  Synopsis 2013 - Seen by [facility] with Dr. [name] for [place]. Normal PFTs.  2020 - Hospitalized at [facility] from 8/30-9/8 for COVID-19 pneumonia. Remained oxygen dependent with PFTs showing restrictive defect and moderated DLCO reduction. Started on prolonged prednisone taper in November 2021 - Off steroids in July. PFTs normalized. Hospitalized for TIA in July. Diagnosed with OSA. 2022 - OSA not controlled on CPAP. Started on BiPAP in June.  07/22/21 He reports using his BiPAP is still difficult to use due to leaking. He recently had a cold in the last month so did miss a few days due to congestion. Denies shortness of breath during the day. No wheezing or cough. He is active in biking and swimming when he up to three times a week if he can.  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     levothyroxine (SYNTHROID) 75 MCG tablet Take 75 mcg by mouth daily.      losartan-hydrochlorothiazide (HYZAAR) 50-12.5 MG tablet Take 0.5 tablets by mouth daily.     Omega-3 Fatty Acids (FISH OIL) 1000 MG CAPS Take 1,000 mg by mouth daily.      pantoprazole (PROTONIX) 20 MG tablet Take 20 mg by mouth daily.     rosuvastatin (CRESTOR) 20 MG tablet Take 1 tablet (20 mg total) by mouth daily. 90 tablet 1   sildenafil (VIAGRA) 25 MG tablet Take 25 mg by mouth daily as needed for erectile dysfunction.     acetaminophen (TYLENOL) 500 MG tablet Take 1,000 mg by mouth every 6 (six) hours as needed for mild pain or moderate pain. (Patient not taking: Reported on 07/12/2021)     No facility-administered medications prior to visit.    Review of Systems  Constitutional:  Negative for chills, diaphoresis, fever, malaise/fatigue and weight loss.  HENT:  Negative for congestion.   Respiratory:  Negative for cough, hemoptysis, sputum production, shortness of breath and wheezing.   Cardiovascular:  Negative for chest pain, palpitations and leg swelling.  Objective:   Vitals:   07/22/21 1026  BP: 122/80  Pulse: (!) 46  Temp: 97.7  F (36.5 C)  TempSrc: Oral  SpO2: 99%  Weight: 248 lb (112.5 kg)  Height: 6\' 1"  (1.854 m)  SpO2: 99 % O2 Device: None (Room air)  Physical Exam: General: Well-appearing, no acute distress HENT: [place], AT Eyes: EOMI, no scleral icterus Respiratory: Clear to auscultation bilaterally.  No crackles, wheezing or rales Cardiovascular: RRR, -M/R/G, no JVD Extremities:-Edema,-tenderness Neuro: AAO x4, CNII-XII grossly intact Psych: Normal mood, normal affect  Data Reviewed:  Imaging: CXR 09/02/19 - Improvement of basilar infiltrates CT Chest 07/28/19 - Mid-to-lower ground glass opacities with interstitial thickening predominantly in lower lobes concerning for NSIP CXR 06/27/19 - Bibasilar airspace  opacities, unchanged. Per radiology, slightly progressed compared to prior CXR 08/14/12 - No evidence of infiltrate, effusion or edema CT Chest 10/01/19 - Slightly improved ground glass opacities. Persistent interstitial reticular thickening without honeycombing that is overall stable. CT Chest 03/01/20 - Improved GGO CTA Chest Aorta (lungs) 10/12/20 - Chronic changes of the lung parenchyma involving mid and lower lobes. No honeycombing  PFT: 07/22/19 FVC 3.40 (66%) FEV1 3.03 (79%) Ratio 86  TLC 71% DLCO 56% Interpretation: Mild restrictive defect with moderately reduced gas exchange  05/20/20 FVC 4.24 (83%) FEV1 3.78 (100%) Ratio 89  TLC 82% DLCO 81% Interpretation: Normal spirometry  12/05/19 6MWT - No desaturations on RA. Completed 476 meters  CPAP Compliance 06/21/21-11/22 Usage 26/30 days (87%) >4 hours 18 days (60%) AHI 6.8 on BiPAP 14/10  Assessment & Plan:   Discussion: 70 year old male with severe OSA and hx lung fibrosis secondary to COVID-19 who presents for follow-up.   Severe OSA The natural history, progression and prognosis of sleep apnea, treatment with PAP and alternative treatment strategies were discussed. The patient was also educated regarding the long term cardiovascular benefits of treating sleep apnea, including improved blood pressure control, reduction in MI and stroke risk as well as other potential benefits of treatment, such as improved glycemic control, facilitation of weight loss, improved energy during the day and improved sleep quality. --Compliant with nightly usage >70% --Counseled on sleep hygiene --Counseled NOT to drive if/when sleepy --Advised patient to wear CPAP for at least 4 hours each night for greater than 70% of the time to avoid the machine being repossessed by insurance. --Refer to DESENSITIZATION MASK FIT. Please call our office if this is not scheduled within 1-2 weeks  Post-inflammatory fibrosis secondary to COVID-19 S/p prolonged  steroid taper. Residual fibrosis on CT. PFTs have normalized. No further oxygen requirements   Health Maintenance Immunization History  Administered Date(s) Administered   DTaP 02/15/2010   Influenza Split 06/14/2009, 08/02/2010, 07/24/2012, 06/20/2013   Influenza, High Dose Seasonal PF 08/29/2018, 06/16/2019   PFIZER(Purple Top)SARS-COV-2 Vaccination 11/13/2019, 12/11/2019   Pneumococcal Conjugate-13 04/30/2018   Pneumococcal Polysaccharide-23 06/18/2019   Tdap 02/15/2010, 05/11/2020   Zoster, Live 12/04/2012   Orders Placed This Encounter  Procedures   Desensitization mask fit    Standing Status:   Future    Standing Expiration Date:   07/22/2022    Order Specific Question:   Where should this test be performed:    Answer:   [place]   No orders of the defined types were placed in this encounter.  Return in about 6 months (around 01/19/2022).  I have spent a total time of 35-minutes on the day of the appointment reviewing prior documentation, coordinating care and discussing medical diagnosis and plan with the patient/family. Past medical history, allergies, medications were reviewed. Pertinent imaging, labs and tests included  in this note have been reviewed and interpreted independently by me.  [place], MD [facility] 07/22/2021 5:19 PM  Office Number [phone]

## 2021-07-22 NOTE — Patient Instructions (Addendum)
Severe OSA The natural history, progression and prognosis of sleep apnea, treatment with PAP and alternative treatment strategies were discussed. The patient was also educated regarding the long term cardiovascular benefits of treating sleep apnea, including improved blood pressure control, reduction in MI and stroke risk as well as other potential benefits of treatment, such as improved glycemic control, facilitation of weight loss, improved energy during the day and improved sleep quality. --Compliant with nightly usage >70% --Counseled on sleep hygiene --Counseled NOT to drive if/when sleepy --Advised patient to wear CPAP for at least 4 hours each night for greater than 70% of the time to avoid the machine being repossessed by insurance. --Refer to DESENSITIZATION MASK FIT. Please call our office if this is not scheduled within 1-2 weeks  Follow-up with me 6 months

## 2021-07-25 ENCOUNTER — Other Ambulatory Visit (HOSPITAL_BASED_OUTPATIENT_CLINIC_OR_DEPARTMENT_OTHER): Payer: Medicare Other | Admitting: Pulmonary Disease

## 2021-08-01 NOTE — Patient Instructions (Addendum)
DUE TO COVID-19 ONLY ONE VISITOR IS ALLOWED TO COME WITH YOU AND STAY IN THE WAITING ROOM ONLY DURING PRE OP AND PROCEDURE DAY OF SURGERY IF YOU ARE GOING HOME AFTER SURGERY. IF YOU ARE SPENDING THE NIGHT 2 PEOPLE MAY VISIT WITH YOU IN YOUR PRIVATE ROOM AFTER SURGERY UNTIL VISITING  HOURS ARE OVER AT 800 PM AND 1  VISITOR  MAY  SPEND THE NIGHT.                  Colin [name]     Your procedure is scheduled on: 08/18/21   Report to [facility] Main  Entrance   Report to short stay at 5:15 AM     Call this number if you have problems the morning of surgery [phone]    No food after midnight.    You may have clear liquid until 4:30 AM.    At 4:00 AM drink pre surgery drink.   Nothing by mouth after 4:30 AM.     CLEAR LIQUID DIET   Foods Allowed                                                                     Foods Excluded                                                                                               liquids that you cannot  Plain Jell-O any favor except red or purple                                           see through such as: Fruit ices (not with fruit pulp)                                     milk, soups, orange juice  Iced Popsicles                                    All solid food Carbonated beverages, regular and diet                                    Cranberry, grape and apple juices Sports drinks like Gatorade Lightly seasoned clear broth or consume(fat free) Sugar     BRUSH YOUR TEETH MORNING OF SURGERY AND RINSE YOUR MOUTH OUT, NO CHEWING GUM CANDY OR MINTS.     Take these medicines the morning of surgery with A SIP OF WATER: Levothyroxine, Pantoprizole          Stop taking  ___Plavix________on __11/25________as instructed by _Sethi____________.                          You may not have any metal on your body including              piercings  Do not wear jewelry,  lotions, powders or  deodorant             Men may shave  face and neck.   Do not bring valuables to the hospital. Colin [name].  Contacts, dentures or bridgework may not be worn into surgery.     Patients discharged the day of surgery will not be allowed to drive home.  IF YOU ARE HAVING SURGERY AND GOING HOME THE SAME DAY, YOU MUST HAVE AN ADULT TO DRIVE YOU HOME AND BE WITH YOU FOR 24 HOURS. YOU MAY GO HOME BY TAXI OR UBER OR ORTHERWISE, BUT AN ADULT MUST ACCOMPANY YOU HOME AND STAY WITH YOU FOR 24 HOURS.  Name and phone number of your driver:  Special Instructions: N/A              Please read over the following fact sheets you were given: _____________________________________________________________________  [facility]- Preparing for Total Shoulder Arthroplasty    Before surgery, you can play an important role. Because skin is not sterile, your skin needs to be as free of germs as possible. You can reduce the number of germs on your skin by using the following products. Benzoyl Peroxide Gel Reduces the number of germs present on the skin Applied twice a day to shoulder area starting two days before surgery    ==================================================================  Please follow these instructions carefully:  BENZOYL PEROXIDE 5% GEL  Please do not use if you have an allergy to benzoyl peroxide.   If your skin becomes reddened/irritated stop using the benzoyl peroxide.  Starting two days before surgery, apply as follows: Apply benzoyl peroxide in the morning and at night. Apply after taking a shower. If you are not taking a shower clean entire shoulder front, back, and side along with the armpit with a clean wet washcloth.  Place a quarter-sized dollop on your shoulder and rub in thoroughly, making sure to cover the front, back, and side of your shoulder, along with the armpit.   2 days before ____ AM   ____ PM              1 day before ____ AM   ____ PM                          Do this twice a day for two days.  (Last application is the night before surgery, AFTER using the CHG soap as described below).  Do NOT apply benzoyl peroxide gel on the day of surgery.            [facility] - Preparing for Surgery Before surgery, you can play an important role.  Because skin is not sterile, your skin needs to be as free of germs as possible.  You can reduce the number of germs on your skin by washing with CHG (chlorahexidine gluconate) soap before surgery.  CHG is an antiseptic cleaner which kills germs and bonds with the skin to continue killing germs even after washing. Please DO NOT use if  you have an allergy to CHG or antibacterial soaps.  If your skin becomes reddened/irritated stop using the CHG and inform your nurse when you arrive at Short Stay.  You may shave your face/neck. Please follow these instructions carefully:  1.  Shower with CHG Soap the night before surgery and the  morning of Surgery.  2.  If you choose to wash your hair, wash your hair first as usual with your  normal  shampoo.  3.  After you shampoo, rinse your hair and body thoroughly to remove the  shampoo.                            4.  Use CHG as you would any other liquid soap.  You can apply chg directly  to the skin and wash                       Gently with a scrungie or clean washcloth.  5.  Apply the CHG Soap to your body ONLY FROM THE NECK DOWN.   Do not use on face/ open                           Wound or open sores. Avoid contact with eyes, ears mouth and genitals (private parts).                       Wash face,  Genitals (private parts) with your normal soap.             6.  Wash thoroughly, paying special attention to the area where your surgery  will be performed.  7.  Thoroughly rinse your body with warm water from the neck down.  8.  DO NOT shower/wash with your normal soap after using and rinsing off  the CHG Soap.                9.  Pat yourself dry with a clean towel.             10.  Wear clean pajamas.            11.  Place clean sheets on your bed the night of your first shower and do not  sleep with pets. Day of Surgery : Do not apply any lotions/deodorants the morning of surgery.  Please wear clean clothes to the hospital/surgery center.  FAILURE TO FOLLOW THESE INSTRUCTIONS MAY RESULT IN THE CANCELLATION OF YOUR SURGERY PATIENT SIGNATURE_________________________________  NURSE SIGNATURE__________________________________  ________________________________________________________________________   Colin [name]  An incentive spirometer is a tool that can help keep your lungs clear and active. This tool measures how well you are filling your lungs with each breath. Taking long deep breaths may help reverse or decrease the chance of developing breathing (pulmonary) problems (especially infection) following: A long period of time when you are unable to move or be active. BEFORE THE PROCEDURE  If the spirometer includes an indicator to show your best effort, your nurse or respiratory therapist will set it to a desired goal. If possible, sit up straight or lean slightly forward. Try not to slouch. Hold the incentive spirometer in an upright position. INSTRUCTIONS FOR USE  Sit on the edge of your bed if possible, or sit up as far as you can in bed or on a chair. Hold the incentive spirometer in an upright position. Breathe out normally.  Place the mouthpiece in your mouth and seal your lips tightly around it. Breathe in slowly and as deeply as possible, raising the piston or the ball toward the top of the column. Hold your breath for 3-5 seconds or for as long as possible. Allow the piston or ball to fall to the bottom of the column. Remove the mouthpiece from your mouth and breathe out normally. Rest for a few seconds and repeat Steps 1 through 7 at least 10 times every 1-2 hours when you are awake. Take your time and take a few normal breaths between deep  breaths. The spirometer may include an indicator to show your best effort. Use the indicator as a goal to work toward during each repetition. After each set of 10 deep breaths, practice coughing to be sure your lungs are clear. If you have an incision (the cut made at the time of surgery), support your incision when coughing by placing a pillow or rolled up towels firmly against it. Once you are able to get out of bed, walk around indoors and cough well. You may stop using the incentive spirometer when instructed by your caregiver.  RISKS AND COMPLICATIONS Take your time so you do not get dizzy or light-headed. If you are in pain, you may need to take or ask for pain medication before doing incentive spirometry. It is harder to take a deep breath if you are having pain. AFTER USE Rest and breathe slowly and easily. It can be helpful to keep track of a log of your progress. Your caregiver can provide you with a simple table to help with this. If you are using the spirometer at home, follow these instructions: [place] IF:  You are having difficultly using the spirometer. You have trouble using the spirometer as often as instructed. Your pain medication is not giving enough relief while using the spirometer. You develop fever of 100.5 F (38.1 C) or higher. SEEK IMMEDIATE MEDICAL CARE IF:  You cough up bloody sputum that had not been present before. You develop fever of 102 F (38.9 C) or greater. You develop worsening pain at or near the incision site. MAKE SURE YOU:  Understand these instructions. Will watch your condition. Will get help right away if you are not doing well or get worse. Document Released: 01/15/2007 Document Revised: 11/27/2011 Document Reviewed: 03/18/2007 [facility] Patient Information 2014 [place], [place].   ________________________________________________________________________

## 2021-08-02 ENCOUNTER — Encounter (HOSPITAL_COMMUNITY): Payer: Self-pay

## 2021-08-02 ENCOUNTER — Encounter (HOSPITAL_COMMUNITY)
Admission: RE | Admit: 2021-08-02 | Discharge: 2021-08-02 | Disposition: A | Discharge status: Home / Self Care | Payer: Medicare Other | Source: Ambulatory Visit | Attending: Anesthesiology | Admitting: Anesthesiology

## 2021-08-02 VITALS — Ht 73.0 in | Wt 258.0 lb

## 2021-08-02 DIAGNOSIS — I10 Essential (primary) hypertension: Secondary | ICD-10-CM

## 2021-08-02 DIAGNOSIS — Z01818 Encounter for other preprocedural examination: Secondary | ICD-10-CM

## 2021-08-02 NOTE — Progress Notes (Addendum)
Pt was a no Show for the PAT visit. I called and left a message.  PAT visit done by phone 08/03/21 7:45 am.  COVID test- NA   PCP - Dr. Iline Oven Cardiologist - Dr. Clearnce Hasten Neurologist- Dr. Leonie Man  Chest x-ray - no EKG - 07/12/21-epic Stress Test - 10/30/19-epic ECHO - 03/24/20-epic Cardiac Cath - no Pacemaker/ICD device last checked:Loop recorder- 07/15/21  Sleep Study - yes CPAP - Bi PAP  Fasting Blood Sugar - Na Checks Blood Sugar _____ times a day  Blood Thinner Instructions:Plavix/ Dr. Leonie Man Aspirin Instructions:Stop 5 days prior to DOS/Dr. Leonie Man Last Dose:11/25  Anesthesia review: yes  Patient denies shortness of breath, fever, cough and chest pain at PAT appointment  Pt has no SOB with activities Patient verbalized understanding of instructions that were given to them at the PAT appointment. Patient was also instructed that they will need to review over the PAT instructions again at home before surgery. Yes. I read the instructions to him over the phone and asked him to read again after picking them up at the Lab only visit 08/04/21 at 8:15 am.

## 2021-08-03 ENCOUNTER — Other Ambulatory Visit: Payer: Self-pay

## 2021-08-03 ENCOUNTER — Encounter (HOSPITAL_COMMUNITY): Payer: Self-pay

## 2021-08-03 DIAGNOSIS — Z8673 Personal history of transient ischemic attack (TIA), and cerebral infarction without residual deficits: Secondary | ICD-10-CM | POA: Diagnosis not present

## 2021-08-03 DIAGNOSIS — K219 Gastro-esophageal reflux disease without esophagitis: Secondary | ICD-10-CM | POA: Diagnosis not present

## 2021-08-03 DIAGNOSIS — I10 Essential (primary) hypertension: Secondary | ICD-10-CM | POA: Diagnosis not present

## 2021-08-03 DIAGNOSIS — J849 Interstitial pulmonary disease, unspecified: Secondary | ICD-10-CM | POA: Diagnosis not present

## 2021-08-03 DIAGNOSIS — G4733 Obstructive sleep apnea (adult) (pediatric): Secondary | ICD-10-CM | POA: Diagnosis not present

## 2021-08-03 DIAGNOSIS — M75101 Unspecified rotator cuff tear or rupture of right shoulder, not specified as traumatic: Secondary | ICD-10-CM | POA: Diagnosis not present

## 2021-08-03 DIAGNOSIS — Z01812 Encounter for preprocedural laboratory examination: Secondary | ICD-10-CM | POA: Diagnosis not present

## 2021-08-03 DIAGNOSIS — Z7902 Long term (current) use of antithrombotics/antiplatelets: Secondary | ICD-10-CM | POA: Diagnosis not present

## 2021-08-04 ENCOUNTER — Encounter (HOSPITAL_COMMUNITY)
Admission: RE | Admit: 2021-08-04 | Discharge: 2021-08-04 | Disposition: A | Discharge status: Home / Self Care | Payer: Medicare Other | Source: Ambulatory Visit | Attending: Orthopedic Surgery | Admitting: Orthopedic Surgery

## 2021-08-04 DIAGNOSIS — G4733 Obstructive sleep apnea (adult) (pediatric): Secondary | ICD-10-CM | POA: Diagnosis not present

## 2021-08-04 DIAGNOSIS — Z01812 Encounter for preprocedural laboratory examination: Secondary | ICD-10-CM | POA: Diagnosis not present

## 2021-08-04 DIAGNOSIS — Z01818 Encounter for other preprocedural examination: Secondary | ICD-10-CM

## 2021-08-04 DIAGNOSIS — I10 Essential (primary) hypertension: Secondary | ICD-10-CM | POA: Insufficient documentation

## 2021-08-04 DIAGNOSIS — K219 Gastro-esophageal reflux disease without esophagitis: Secondary | ICD-10-CM | POA: Insufficient documentation

## 2021-08-04 DIAGNOSIS — M75101 Unspecified rotator cuff tear or rupture of right shoulder, not specified as traumatic: Secondary | ICD-10-CM | POA: Insufficient documentation

## 2021-08-04 DIAGNOSIS — Z7902 Long term (current) use of antithrombotics/antiplatelets: Secondary | ICD-10-CM | POA: Insufficient documentation

## 2021-08-04 DIAGNOSIS — Z8673 Personal history of transient ischemic attack (TIA), and cerebral infarction without residual deficits: Secondary | ICD-10-CM | POA: Insufficient documentation

## 2021-08-04 DIAGNOSIS — J849 Interstitial pulmonary disease, unspecified: Secondary | ICD-10-CM | POA: Insufficient documentation

## 2021-08-04 LAB — CBC
HCT: 41.5 % (ref 39.0–52.0)
Hemoglobin: 13.3 g/dL (ref 13.0–17.0)
MCH: 30.2 pg (ref 26.0–34.0)
MCHC: 32 g/dL (ref 30.0–36.0)
MCV: 94.3 fL (ref 80.0–100.0)
Platelets: 214 10*3/uL (ref 150–400)
RBC: 4.4 MIL/uL (ref 4.22–5.81)
RDW: 13.2 % (ref 11.5–15.5)
WBC: 5.4 10*3/uL (ref 4.0–10.5)
nRBC: 0 % (ref 0.0–0.2)

## 2021-08-04 LAB — BASIC METABOLIC PANEL
Anion gap: 7 (ref 5–15)
BUN: 15 mg/dL (ref 8–23)
CO2: 25 mmol/L (ref 22–32)
Calcium: 9 mg/dL (ref 8.9–10.3)
Chloride: 107 mmol/L (ref 98–111)
Creatinine, Ser: 0.85 mg/dL (ref 0.61–1.24)
GFR, Estimated: >60 mL/min (ref >60)
Glucose, Bld: 99 mg/dL (ref 70–99)
Potassium: 4.4 mmol/L (ref 3.5–5.1)
Sodium: 139 mmol/L (ref 135–145)

## 2021-08-04 LAB — SURGICAL PCR SCREEN
MRSA, PCR: NEGATIVE
Staphylococcus aureus: POSITIVE — AB

## 2021-08-05 DIAGNOSIS — K648 Other hemorrhoids: Secondary | ICD-10-CM | POA: Diagnosis not present

## 2021-08-05 DIAGNOSIS — Z86010 Personal history of colonic polyps: Secondary | ICD-10-CM | POA: Diagnosis not present

## 2021-08-08 NOTE — Progress Notes (Signed)
Anesthesia Chart Review   Case: [id number] Date/Time: 08/18/21 0715   Procedure: REVERSE SHOULDER ARTHROPLASTY (Right: Shoulder) - 174min   Anesthesia type: General   Pre-op diagnosis: Right shoulder rotator cuff tear arthropathy   Location: [facility] ROOM 06 / [facility]   Surgeons: Justice Britain, MD       DISCUSSION:70 y.o. never smoker with h/o GERD, HTN, OSA, ILD, CVA, right shoulder rotator cuff tear scheduled for above procedure 08/18/2021 with Dr. Justice Britain.   Pt seen by cardiology 07/12/2021 for preoperative evaluation.  Per OV note, "Chart reviewed as part of pre-operative protocol coverage. Given past medical history and time since last visit, based on ACC/AHA guidelines, VARDAAN DEPASCALE would be at acceptable risk for the planned procedure without further cardiovascular testing.    Patient's Plavix is prescribed by his neurologist.  He will need to receive recommendations on Plavix hold from prescribing provider."  Per neurology, "Okay to hold Plavix for 5 days prior to procedure and restart after with a small but acceptable peri-procedural risk of TIA/stroke, if the patient is willing."  Anticipate pt can proceed with planned procedure barring acute status change.   VS: BP 123/78   Pulse (!) 53   Temp 36.8 C (Oral)   Resp 20   Ht 6\' 1"  (1.854 m)   Wt 113.4 kg   SpO2 100%   BMI 32.98 kg/m   PROVIDERS: Cory Munch, PA-C is PCP   Thompson Grayer, MD is Cardiologist  LABS: Labs reviewed: Acceptable for surgery. (all labs ordered are listed, but only abnormal results are displayed)  Labs Reviewed  SURGICAL PCR SCREEN - Abnormal; Notable for the following components:      Result Value   Staphylococcus aureus POSITIVE (*)    All other components within normal limits  BASIC METABOLIC PANEL  CBC     IMAGES:   EKG: 07/12/2021 Rate 54 bpm  Sinus bradycardia with possible premature atrial complexes with aberrant conduction  CV: Stress Test 10/30/19 Nuclear stress EF:  66%. No wall motion abnormalities There was no ST segment deviation noted during stress. The study is normal. No perfusion defects suggestive of ischemia or infarct. This is a low risk study.  Echo 10/27/2019   1. Left ventricular ejection fraction, by estimation, is 55 to 60%. The  left ventricle has normal function. The average left ventricular global  longitudinal strain is -17.8 %.   2. Right ventricular systolic function is normal. There is mildly  elevated pulmonary artery systolic pressure.   3. The aortic valve is tricuspid.  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   TIA (transient ischemic attack) 03/2020   Wears glasses     Past Surgical History:  Procedure Laterality Date   APPENDECTOMY     CARPAL TUNNEL RELEASE  04/23/2012   Procedure: CARPAL TUNNEL RELEASE;  Surgeon: Cammie Sickle., MD;  Location: [facility];  Service: Orthopedics;  Laterality: Left;   CARPAL TUNNEL RELEASE  06/27/2012   Procedure: CARPAL TUNNEL RELEASE;  Surgeon: Cammie Sickle., MD;  Location: [facility];  Service: Orthopedics;  Laterality: Right;   CERVICAL DISCECTOMY  2007   colonscopy     polyps removed / benign   INCISIONAL HERNIA REPAIR  2011   subcostal from append   JOINT REPLACEMENT  81,87   lt total hip   LAPAROSCOPIC APPENDECTOMY  2010   required open repair   LOOP RECORDER INSERTION N/A 03/25/2020   Procedure: LOOP RECORDER INSERTION;  Surgeon: Thompson Grayer, MD;  Location: [facility];  Service: Cardiovascular;  Laterality: N/A;   SHOULDER ARTHROSCOPY WITH ROTATOR CUFF REPAIR Right 02/12/2020   Procedure: SHOULDER ARTHROSCOPY WITH ROTATOR CUFF REPAIR with subacromial decompression distal clavicle resection biceps tenotomy labral debridement;  Surgeon: Sydnee Cabal, MD;  Location: [facility];  Service: Orthopedics;  Laterality: Right;  interscalene block   TONSILLECTOMY     TOTAL HIP ARTHROPLASTY     x3 left hip [phone]   TOTAL HIP ARTHROPLASTY Right 04/05/2016   Procedure: RIGHT TOTAL HIP ARTHROPLASTY ANTERIOR APPROACH;  Surgeon: Gaynelle Arabian, MD;  Location: [facility];  Service: Orthopedics;  Laterality: Right;    MEDICATIONS:  clopidogrel (PLAVIX) 75 MG tablet   levothyroxine (SYNTHROID) 75 MCG tablet   losartan-hydrochlorothiazide (HYZAAR) 50-12.5 MG tablet   Omega-3 Fatty Acids (FISH OIL) 1000 MG CAPS   pantoprazole (PROTONIX) 40 MG tablet   rosuvastatin (CRESTOR) 20 MG tablet   sildenafil (VIAGRA) 25 MG tablet   No current facility-administered medications for this encounter.    Konrad Felix Ward, PA-C [facility] [phone]

## 2021-08-08 NOTE — Anesthesia Preprocedure Evaluation (Deleted)
Anesthesia Evaluation    Airway        Dental   Pulmonary           Cardiovascular hypertension,      Neuro/Psych    GI/Hepatic   Endo/Other    Renal/GU      Musculoskeletal   Abdominal   Peds  Hematology   Anesthesia Other Findings   Reproductive/Obstetrics                             Anesthesia Physical Anesthesia Plan  ASA:   Anesthesia Plan:    Post-op Pain Management:    Induction:   PONV Risk Score and Plan:   Airway Management Planned:   Additional Equipment:   Intra-op Plan:   Post-operative Plan:   Informed Consent:   Plan Discussed with:   Anesthesia Plan Comments: (See PAT note 08/04/2021, Konrad Felix Ward, PA-C)        Anesthesia Quick Evaluation

## 2021-08-16 ENCOUNTER — Ambulatory Visit (INDEPENDENT_AMBULATORY_CARE_PROVIDER_SITE_OTHER): Payer: Medicare Other

## 2021-08-16 DIAGNOSIS — I639 Cerebral infarction, unspecified: Secondary | ICD-10-CM | POA: Diagnosis not present

## 2021-08-16 LAB — CUP PACEART REMOTE DEVICE CHECK
Date Time Interrogation Session: 20221128145156
Implantable Pulse Generator Implant Date: 20210708

## 2021-08-17 NOTE — Anesthesia Preprocedure Evaluation (Addendum)
Anesthesia Evaluation  Patient identified by MRN, date of birth, ID band Patient awake    Reviewed: Allergy & Precautions, NPO status , Patient's Chart, lab work & pertinent test results  History of Anesthesia Complications Negative for: history of anesthetic complications  Airway Mallampati: II  TM Distance: >3 FB Neck ROM: Full    Dental no notable dental hx. (+) Dental Advisory Given   Pulmonary shortness of breath and with exertion, sleep apnea and Continuous Positive Airway Pressure Ventilation ,  Interstitial lung disease   breath sounds clear to auscultation       Cardiovascular hypertension, Pt. on medications (-) angina+ pacemaker  Rhythm:Regular Rate:Normal  Pt seen by cardiology 07/12/2021 for preoperative evaluation.  Per OV note, "Chart reviewed as part of pre-operative protocol coverage. Given past medical history and time since last visit, based on ACC/AHA guidelines,Travelle M Clementwould be at acceptable risk for the planned procedure without further cardiovascular testing.   Patient's Plavix is prescribed by his neurologist. He will need to receive recommendations on Plavix hold from prescribing provider."  Per neurology, "Okay to hold Plavix for 5 days prior to procedure and restart after with a small but acceptable peri-procedural risk of TIA/stroke, if the patient is willing."  Stress Test 10/30/19 ? Nuclear stress EF: 66%. No wall motion abnormalities ? There was no ST segment deviation noted during stress. ? The study is normal. No perfusion defects suggestive of ischemia or infarct. ? This is a low risk study.  Last echo 10/27/19: 1. Left ventricular ejection fraction, by estimation, is 55 to 60%. The  left ventricle has normal function. The average left ventricular global  longitudinal strain is -17.8 %.  2. Right ventricular systolic function is normal. There is mildly  elevated pulmonary artery  systolic pressure.  3. The aortic valve is tricuspid.    Neuro/Psych PSYCHIATRIC DISORDERS Depression S/p C spine surgery 2007 [facility]VA    GI/Hepatic Neg liver ROS, GERD  Medicated and Controlled,  Endo/Other  Hypothyroidism Obesity BMI 35  Renal/GU negative Renal ROS  negative genitourinary   Musculoskeletal  (+) Arthritis , Osteoarthritis,  Right shoulder rotator cuff tear    Abdominal (+) + obese,   Peds  Hematology negative hematology ROS (+)   Anesthesia Other Findings   Reproductive/Obstetrics negative OB ROS                            Anesthesia Physical  Anesthesia Plan  ASA: 3  Anesthesia Plan: General   Post-op Pain Management:  Regional for Post-op pain and Regional block, Celebrex PO (pre-op) and Tylenol PO (pre-op)   Induction: Intravenous  PONV Risk Score and Plan: 2 and Ondansetron, Dexamethasone, Treatment may vary due to age or medical condition and Midazolam  Airway Management Planned: Oral ETT  Additional Equipment: None  Intra-op Plan:   Post-operative Plan: Extubation in OR  Informed Consent: I have reviewed the patients History and Physical, chart, labs and discussed the procedure including the risks, benefits and alternatives for the proposed anesthesia with the patient or authorized representative who has indicated his/her understanding and acceptance.     Dental advisory given  Plan Discussed with: Anesthesiologist and CRNA  Anesthesia Plan Comments:        Anesthesia Quick Evaluation

## 2021-08-18 ENCOUNTER — Ambulatory Visit (HOSPITAL_COMMUNITY)
Admission: RE | Admit: 2021-08-18 | Discharge: 2021-08-18 | Disposition: A | Discharge status: Home / Self Care | Payer: Medicare Other | Source: Ambulatory Visit | Attending: Orthopedic Surgery | Admitting: Orthopedic Surgery

## 2021-08-18 ENCOUNTER — Encounter (HOSPITAL_COMMUNITY): Payer: Self-pay | Admitting: Orthopedic Surgery

## 2021-08-18 ENCOUNTER — Encounter (HOSPITAL_COMMUNITY)
Admission: RE | Disposition: A | Discharge status: Home / Self Care | Payer: Self-pay | Source: Ambulatory Visit | Attending: Orthopedic Surgery

## 2021-08-18 ENCOUNTER — Ambulatory Visit (HOSPITAL_COMMUNITY): Payer: Medicare Other | Admitting: Physician Assistant

## 2021-08-18 ENCOUNTER — Ambulatory Visit (HOSPITAL_COMMUNITY): Payer: Medicare Other | Admitting: Certified Registered Nurse Anesthetist

## 2021-08-18 DIAGNOSIS — M19011 Primary osteoarthritis, right shoulder: Secondary | ICD-10-CM | POA: Diagnosis not present

## 2021-08-18 DIAGNOSIS — G4730 Sleep apnea, unspecified: Secondary | ICD-10-CM | POA: Diagnosis not present

## 2021-08-18 DIAGNOSIS — I10 Essential (primary) hypertension: Secondary | ICD-10-CM | POA: Insufficient documentation

## 2021-08-18 DIAGNOSIS — Z8673 Personal history of transient ischemic attack (TIA), and cerebral infarction without residual deficits: Secondary | ICD-10-CM | POA: Diagnosis not present

## 2021-08-18 DIAGNOSIS — Z950 Presence of cardiac pacemaker: Secondary | ICD-10-CM | POA: Insufficient documentation

## 2021-08-18 DIAGNOSIS — G8918 Other acute postprocedural pain: Secondary | ICD-10-CM | POA: Diagnosis not present

## 2021-08-18 DIAGNOSIS — M12811 Other specific arthropathies, not elsewhere classified, right shoulder: Secondary | ICD-10-CM | POA: Diagnosis not present

## 2021-08-18 DIAGNOSIS — Z6832 Body mass index (BMI) 32.0-32.9, adult: Secondary | ICD-10-CM | POA: Insufficient documentation

## 2021-08-18 DIAGNOSIS — E669 Obesity, unspecified: Secondary | ICD-10-CM | POA: Diagnosis not present

## 2021-08-18 DIAGNOSIS — K219 Gastro-esophageal reflux disease without esophagitis: Secondary | ICD-10-CM | POA: Diagnosis not present

## 2021-08-18 DIAGNOSIS — M75101 Unspecified rotator cuff tear or rupture of right shoulder, not specified as traumatic: Secondary | ICD-10-CM | POA: Insufficient documentation

## 2021-08-18 DIAGNOSIS — Z79899 Other long term (current) drug therapy: Secondary | ICD-10-CM | POA: Diagnosis not present

## 2021-08-18 DIAGNOSIS — Z9989 Dependence on other enabling machines and devices: Secondary | ICD-10-CM | POA: Diagnosis not present

## 2021-08-18 DIAGNOSIS — G4733 Obstructive sleep apnea (adult) (pediatric): Secondary | ICD-10-CM | POA: Diagnosis not present

## 2021-08-18 HISTORY — PX: REVERSE SHOULDER ARTHROPLASTY: SHX5054

## 2021-08-18 SURGERY — ARTHROPLASTY, SHOULDER, TOTAL, REVERSE
Anesthesia: General | Site: Shoulder | Laterality: Right

## 2021-08-18 MED ORDER — VANCOMYCIN HCL 1000 MG IV SOLR
INTRAVENOUS | Status: AC
  Filled 2021-08-18: qty 20

## 2021-08-18 MED ORDER — PHENYLEPHRINE HCL-NACL 20-0.9 MG/250ML-% IV SOLN
INTRAVENOUS | Status: DC | PRN
  Administered 2021-08-18: 20 ug/min via INTRAVENOUS

## 2021-08-18 MED ORDER — ROCURONIUM BROMIDE 10 MG/ML (PF) SYRINGE
PREFILLED_SYRINGE | INTRAVENOUS | Status: AC
  Filled 2021-08-18: qty 10

## 2021-08-18 MED ORDER — CHLORHEXIDINE GLUCONATE 0.12 % MT SOLN
15.0000 mL | Freq: Once | OROMUCOSAL | Status: AC
  Administered 2021-08-18: 15 mL via OROMUCOSAL

## 2021-08-18 MED ORDER — CELECOXIB 200 MG PO CAPS
200.0000 mg | ORAL_CAPSULE | Freq: Once | ORAL | Status: AC
  Administered 2021-08-18: 200 mg via ORAL
  Filled 2021-08-18: qty 1

## 2021-08-18 MED ORDER — PROPOFOL 10 MG/ML IV BOLUS
INTRAVENOUS | Status: DC | PRN
  Administered 2021-08-18: 200 mg via INTRAVENOUS

## 2021-08-18 MED ORDER — SUGAMMADEX SODIUM 500 MG/5ML IV SOLN
INTRAVENOUS | Status: AC
  Filled 2021-08-18: qty 5

## 2021-08-18 MED ORDER — TRANEXAMIC ACID-NACL 1000-0.7 MG/100ML-% IV SOLN
1000.0000 mg | INTRAVENOUS | Status: AC
  Administered 2021-08-18: 1000 mg via INTRAVENOUS
  Filled 2021-08-18: qty 100

## 2021-08-18 MED ORDER — PROPOFOL 10 MG/ML IV BOLUS
INTRAVENOUS | Status: AC
  Filled 2021-08-18: qty 20

## 2021-08-18 MED ORDER — ROCURONIUM BROMIDE 10 MG/ML (PF) SYRINGE
PREFILLED_SYRINGE | INTRAVENOUS | Status: DC | PRN
  Administered 2021-08-18: 100 mg via INTRAVENOUS

## 2021-08-18 MED ORDER — MIDAZOLAM HCL 2 MG/2ML IJ SOLN
INTRAMUSCULAR | Status: AC
  Filled 2021-08-18: qty 2

## 2021-08-18 MED ORDER — DEXAMETHASONE SODIUM PHOSPHATE 10 MG/ML IJ SOLN
INTRAMUSCULAR | Status: AC
  Filled 2021-08-18: qty 1

## 2021-08-18 MED ORDER — LACTATED RINGERS IV SOLN: INTRAVENOUS | Status: DC

## 2021-08-18 MED ORDER — CYCLOBENZAPRINE HCL 10 MG PO TABS: 10.0000 mg | ORAL_TABLET | Freq: Three times a day (TID) | ORAL | 1 refills | Status: DC | PRN

## 2021-08-18 MED ORDER — LACTATED RINGERS IV BOLUS
500.0000 mL | Freq: Once | INTRAVENOUS | Status: AC
  Administered 2021-08-18: 500 mL via INTRAVENOUS

## 2021-08-18 MED ORDER — ONDANSETRON HCL 4 MG/2ML IJ SOLN
INTRAMUSCULAR | Status: DC | PRN
  Administered 2021-08-18: 4 mg via INTRAVENOUS

## 2021-08-18 MED ORDER — FENTANYL CITRATE (PF) 100 MCG/2ML IJ SOLN
INTRAMUSCULAR | Status: DC | PRN
  Administered 2021-08-18 (×2): 50 ug via INTRAVENOUS

## 2021-08-18 MED ORDER — BUPIVACAINE HCL (PF) 0.5 % IJ SOLN
INTRAMUSCULAR | Status: DC | PRN
  Administered 2021-08-18: 15 mL via PERINEURAL

## 2021-08-18 MED ORDER — SUGAMMADEX SODIUM 500 MG/5ML IV SOLN
INTRAVENOUS | Status: DC | PRN
  Administered 2021-08-18: 250 mg via INTRAVENOUS

## 2021-08-18 MED ORDER — VANCOMYCIN HCL 1000 MG IV SOLR
INTRAVENOUS | Status: DC | PRN
  Administered 2021-08-18: 1000 mg via TOPICAL

## 2021-08-18 MED ORDER — AMISULPRIDE (ANTIEMETIC) 5 MG/2ML IV SOLN: 10.0000 mg | Freq: Once | INTRAVENOUS | Status: DC | PRN

## 2021-08-18 MED ORDER — DEXAMETHASONE SODIUM PHOSPHATE 10 MG/ML IJ SOLN
INTRAMUSCULAR | Status: DC | PRN
  Administered 2021-08-18: 8 mg via INTRAVENOUS

## 2021-08-18 MED ORDER — PROMETHAZINE HCL 25 MG/ML IJ SOLN: 6.2500 mg | INTRAMUSCULAR | Status: DC | PRN

## 2021-08-18 MED ORDER — ONDANSETRON HCL 4 MG PO TABS: 4.0000 mg | ORAL_TABLET | Freq: Three times a day (TID) | ORAL | 0 refills | Status: DC | PRN

## 2021-08-18 MED ORDER — NAPROXEN 500 MG PO TABS: 500.0000 mg | ORAL_TABLET | Freq: Two times a day (BID) | ORAL | 1 refills | Status: DC

## 2021-08-18 MED ORDER — ACETAMINOPHEN 500 MG PO TABS
1000.0000 mg | ORAL_TABLET | Freq: Once | ORAL | Status: AC
  Administered 2021-08-18: 1000 mg via ORAL
  Filled 2021-08-18: qty 2

## 2021-08-18 MED ORDER — CEFAZOLIN SODIUM-DEXTROSE 2-4 GM/100ML-% IV SOLN
2.0000 g | INTRAVENOUS | Status: AC
  Administered 2021-08-18: 2 g via INTRAVENOUS
  Filled 2021-08-18: qty 100

## 2021-08-18 MED ORDER — FENTANYL CITRATE PF 50 MCG/ML IJ SOSY
25.0000 ug | PREFILLED_SYRINGE | INTRAMUSCULAR | Status: DC | PRN
  Administered 2021-08-18 (×2): 50 ug via INTRAVENOUS

## 2021-08-18 MED ORDER — STERILE WATER FOR IRRIGATION IR SOLN
Status: DC | PRN
  Administered 2021-08-18: 2000 mL

## 2021-08-18 MED ORDER — ONDANSETRON HCL 4 MG/2ML IJ SOLN
INTRAMUSCULAR | Status: AC
  Filled 2021-08-18: qty 2

## 2021-08-18 MED ORDER — FENTANYL CITRATE PF 50 MCG/ML IJ SOSY
PREFILLED_SYRINGE | INTRAMUSCULAR | Status: AC
  Filled 2021-08-18: qty 3

## 2021-08-18 MED ORDER — TRANEXAMIC ACID 1000 MG/10ML IV SOLN: 1000.0000 mg | INTRAVENOUS | Status: DC

## 2021-08-18 MED ORDER — EPHEDRINE 5 MG/ML INJ
INTRAVENOUS | Status: AC
  Filled 2021-08-18: qty 5

## 2021-08-18 MED ORDER — 0.9 % SODIUM CHLORIDE (POUR BTL) OPTIME
TOPICAL | Status: DC | PRN
  Administered 2021-08-18: 1000 mL

## 2021-08-18 MED ORDER — PHENYLEPHRINE HCL-NACL 20-0.9 MG/250ML-% IV SOLN
INTRAVENOUS | Status: AC
  Filled 2021-08-18: qty 750

## 2021-08-18 MED ORDER — ORAL CARE MOUTH RINSE: 15.0000 mL | Freq: Once | OROMUCOSAL | Status: AC

## 2021-08-18 MED ORDER — LACTATED RINGERS IV BOLUS
250.0000 mL | Freq: Once | INTRAVENOUS | Status: AC
  Administered 2021-08-18: 250 mL via INTRAVENOUS

## 2021-08-18 MED ORDER — MIDAZOLAM HCL 5 MG/5ML IJ SOLN
INTRAMUSCULAR | Status: DC | PRN
  Administered 2021-08-18: .5 mg via INTRAVENOUS
  Administered 2021-08-18: 1 mg via INTRAVENOUS
  Administered 2021-08-18: .5 mg via INTRAVENOUS

## 2021-08-18 MED ORDER — LIDOCAINE HCL (PF) 2 % IJ SOLN
INTRAMUSCULAR | Status: AC
  Filled 2021-08-18: qty 5

## 2021-08-18 MED ORDER — EPHEDRINE SULFATE-NACL 50-0.9 MG/10ML-% IV SOSY
PREFILLED_SYRINGE | INTRAVENOUS | Status: DC | PRN
  Administered 2021-08-18 (×2): 5 mg via INTRAVENOUS
  Administered 2021-08-18: 10 mg via INTRAVENOUS
  Administered 2021-08-18 (×2): 5 mg via INTRAVENOUS

## 2021-08-18 MED ORDER — BUPIVACAINE LIPOSOME 1.3 % IJ SUSP
INTRAMUSCULAR | Status: DC | PRN
  Administered 2021-08-18: 10 mL via PERINEURAL

## 2021-08-18 MED ORDER — FENTANYL CITRATE (PF) 100 MCG/2ML IJ SOLN
INTRAMUSCULAR | Status: AC
  Filled 2021-08-18: qty 2

## 2021-08-18 MED ORDER — OXYCODONE-ACETAMINOPHEN 5-325 MG PO TABS
1.0000 | ORAL_TABLET | ORAL | 0 refills | Status: DC | PRN
Start: 2021-08-18 — End: 2022-04-06

## 2021-08-18 SURGICAL SUPPLY — 67 items
BAG COUNTER SPONGE SURGICOUNT (BAG) IMPLANT
BAG ZIPLOCK 12X15 (MISCELLANEOUS) ×2 IMPLANT
BLADE SAW SGTL 83.5X18.5 (BLADE) ×2 IMPLANT
BNDG COHESIVE 4X5 TAN ST LF (GAUZE/BANDAGES/DRESSINGS) ×2 IMPLANT
COOLER ICEMAN CLASSIC (MISCELLANEOUS) ×2 IMPLANT
COVER BACK TABLE 60X90IN (DRAPES) ×2 IMPLANT
COVER SURGICAL LIGHT HANDLE (MISCELLANEOUS) ×2 IMPLANT
CUP SUT UNIV REVERS 39 NEU (Shoulder) ×2 IMPLANT
DERMABOND ADVANCED (GAUZE/BANDAGES/DRESSINGS) ×1
DERMABOND ADVANCED .7 DNX12 (GAUZE/BANDAGES/DRESSINGS) ×1 IMPLANT
DRAPE INCISE IOBAN 66X45 STRL (DRAPES) IMPLANT
DRAPE ORTHO SPLIT 77X108 STRL (DRAPES) ×4
DRAPE SHEET LG 3/4 BI-LAMINATE (DRAPES) ×2 IMPLANT
DRAPE SURG 17X11 SM STRL (DRAPES) ×2 IMPLANT
DRAPE SURG ORHT 6 SPLT 77X108 (DRAPES) ×2 IMPLANT
DRAPE TOP 10253 STERILE (DRAPES) ×2 IMPLANT
DRAPE U-SHAPE 47X51 STRL (DRAPES) ×2 IMPLANT
DRESSING AQUACEL AG SP 3.5X6 (GAUZE/BANDAGES/DRESSINGS) ×1 IMPLANT
DRSG AQUACEL AG ADV 3.5X 6 (GAUZE/BANDAGES/DRESSINGS) ×2 IMPLANT
DRSG AQUACEL AG ADV 3.5X10 (GAUZE/BANDAGES/DRESSINGS) ×2 IMPLANT
DRSG AQUACEL AG SP 3.5X6 (GAUZE/BANDAGES/DRESSINGS) ×2
DURAPREP 26ML APPLICATOR (WOUND CARE) ×2 IMPLANT
ELECT BLADE TIP CTD 4 INCH (ELECTRODE) ×2 IMPLANT
ELECT REM PT RETURN 15FT ADLT (MISCELLANEOUS) ×2 IMPLANT
FACESHIELD WRAPAROUND (MASK) ×8 IMPLANT
GLENOID UNI REV MOD 24 +2 LAT (Joint) ×2 IMPLANT
GLENOSPHERE 39+4 LAT/24 UNI RV (Joint) ×2 IMPLANT
GLOVE SRG 8 PF TXTR STRL LF DI (GLOVE) ×1 IMPLANT
GLOVE SURG ENC MOIS LTX SZ7 (GLOVE) ×2 IMPLANT
GLOVE SURG ENC MOIS LTX SZ7.5 (GLOVE) ×2 IMPLANT
GLOVE SURG UNDER POLY LF SZ7 (GLOVE) ×2 IMPLANT
GLOVE SURG UNDER POLY LF SZ8 (GLOVE) ×2
GOWN STRL REUS W/TWL LRG LVL3 (GOWN DISPOSABLE) ×4 IMPLANT
INSERT HUMERAL 39/+6 (Insert) ×2 IMPLANT
KIT BASIN OR (CUSTOM PROCEDURE TRAY) ×2 IMPLANT
KIT TURNOVER KIT A (KITS) IMPLANT
MANIFOLD NEPTUNE II (INSTRUMENTS) ×2 IMPLANT
NEEDLE TAPERED W/ NITINOL LOOP (MISCELLANEOUS) ×2 IMPLANT
NS IRRIG 1000ML POUR BTL (IV SOLUTION) ×2 IMPLANT
PACK SHOULDER (CUSTOM PROCEDURE TRAY) ×2 IMPLANT
PAD ARMBOARD 7.5X6 YLW CONV (MISCELLANEOUS) ×2 IMPLANT
PAD COLD SHLDR WRAP-ON (PAD) ×2 IMPLANT
PIN NITINOL TARGETER 2.8 (PIN) IMPLANT
PIN SET MODULAR GLENOID SYSTEM (PIN) IMPLANT
RESTRAINT HEAD UNIVERSAL NS (MISCELLANEOUS) ×2 IMPLANT
SCREW CENTRAL MOD 30MM (Screw) ×2 IMPLANT
SCREW PERI LOCK 5.5X24 (Screw) ×2 IMPLANT
SCREW PERI LOCK 5.5X32 (Screw) ×2 IMPLANT
SCREW PERIPHERAL 5.5X20 LOCK (Screw) ×2 IMPLANT
SCREW PERIPHERAL 5.5X28 LOCK (Screw) ×2 IMPLANT
SLING ARM FOAM STRAP LRG (SOFTGOODS) ×2 IMPLANT
SLING ARM FOAM STRAP MED (SOFTGOODS) IMPLANT
SPONGE T-LAP 18X18 ~~LOC~~+RFID (SPONGE) IMPLANT
STEM HUMERAL UNIVERS SZ8 (Stem) ×2 IMPLANT
SUCTION FRAZIER HANDLE 12FR (TUBING) ×2
SUCTION TUBE FRAZIER 12FR DISP (TUBING) ×1 IMPLANT
SUT FIBERWIRE #2 38 T-5 BLUE (SUTURE)
SUT MNCRL AB 3-0 PS2 18 (SUTURE) ×2 IMPLANT
SUT MON AB 2-0 CT1 36 (SUTURE) ×2 IMPLANT
SUT VIC AB 1 CT1 36 (SUTURE) ×2 IMPLANT
SUTURE FIBERWR #2 38 T-5 BLUE (SUTURE) IMPLANT
SUTURE TAPE 1.3 40 TPR END (SUTURE) ×2 IMPLANT
SUTURETAPE 1.3 40 TPR END (SUTURE) ×4
TOWEL OR 17X26 10 PK STRL BLUE (TOWEL DISPOSABLE) ×2 IMPLANT
TOWEL OR NON WOVEN STRL DISP B (DISPOSABLE) ×2 IMPLANT
WATER STERILE IRR 1000ML POUR (IV SOLUTION) ×4 IMPLANT
YANKAUER SUCT BULB TIP 10FT TU (MISCELLANEOUS) ×2 IMPLANT

## 2021-08-18 NOTE — Anesthesia Postprocedure Evaluation (Signed)
Anesthesia Post Note  Patient: Colin [name]  Procedure(s) Performed: REVERSE SHOULDER ARTHROPLASTY (Right: Shoulder)     Patient location during evaluation: PACU Anesthesia Type: General Level of consciousness: sedated Pain management: pain level controlled Vital Signs Assessment: post-procedure vital signs reviewed and stable Respiratory status: spontaneous breathing and respiratory function stable Cardiovascular status: stable Postop Assessment: no apparent nausea or vomiting Anesthetic complications: no   No notable events documented.  Last Vitals:  Vitals:   08/18/21 1057 08/18/21 1100  BP: (!) 148/83 (!) 155/76  Pulse: (!) 46 (!) 45  Resp: 16   Temp:    SpO2: 95% 95%    Last Pain:  Vitals:   08/18/21 1057  TempSrc:   PainSc: 3                  Abdurahman Rugg DANIEL

## 2021-08-18 NOTE — Op Note (Signed)
08/18/2021  9:19 AM  PATIENT:   Colin [name]  70 y.o. male  PRE-OPERATIVE DIAGNOSIS:  Right shoulder rotator cuff tear arthropathy  POST-OPERATIVE DIAGNOSIS: Same  PROCEDURE: Right shoulder reverse arthroplasty utilizing a press-fit size 8 Arthrex stem with a neutral metaphysis, +6 polyethylene insert, 39/+4 glenosphere on a small/+2 baseplate  SURGEON:  Crystallee Werden, Metta Clines M.D.  ASSISTANTS: Jenetta Loges, PA-C  ANESTHESIA:   General endotracheal and interscalene block with Exparel  EBL: 150 cc  SPECIMEN: None  Drains: None   PATIENT DISPOSITION:  PACU - hemodynamically stable.    PLAN OF CARE: Discharge to home after PACU  Brief history:  Patient is a 70 year old gentleman who has had chronic and progressively increasing right shoulder pain related to severe rotator cuff tear arthropathy.  He had an original traumatic injury with attempted repair of the rotator cuff which unfortunately developed a recurrent tear now with characteristic changes radiographically consistent with advanced rotator cuff tear arthropathy.  Due to his increasing pain and functional imitations and failure to respond to prolonged attempts at conservative management, he is brought to the operating room at this time for planned right shoulder reverse arthroplasty.  Preoperatively, I counseled the patient regarding treatment options and risks versus benefits thereof.  Possible surgical complications were all reviewed including potential for bleeding, infection, neurovascular injury, persistent pain, loss of motion, anesthetic complication, failure of the implant, and possible need for additional surgery. They understand and accept and agrees with our planned procedure.   Procedure in detail:  After undergoing routine preop evaluation the patient received prophylactic antibiotics and interscalene block with Exparel was established in the holding area by the anesthesia department.  Subsequently placed supine  on the operating table and underwent the smooth induction of a general endotracheal anesthesia.  Placed into the beachchair position and appropriately padded and protected.  The right shoulder girdle region was sterilely prepped and draped in standard fashion.  Timeout was called.  A deltopectoral approach to the right shoulder was made through an approximately 10 cm incision.  Skin flaps were elevated dissection carried deeply and the deltopectoral interval was then developed from proximal to distal with the vein taken laterally.  The conjoined tendon was mobilized and retracted medially.  The long head biceps tendon was then tenodesed at the upper border of the pectoralis major tendon with the proximal segment unroofed and excised.  The subscapularis was then separated from the lesser tuberosity using electrocautery and the free margin was then tagged with a pair of FiberWire's grasping sutures.  Capsular attachments were then divided in a subperiosteal fashion from the anterior and infra margins of the humeral neck allowing delivery of the humeral head through the wound.  An extra medullary guide was then used to outline our proposed humeral head resection which we performed with an oscillating saw at approximately 20 degrees of retroversion.  Peripheral osteophytes were removed and a metal cap was then placed over the cut proximal humeral surface.  At this point we then exposed the glenoid and did identify some difficulties and exposure so I went back and removed an additional 2 mm off the humeral metaphysis and then replaced our metal cap and at this point had appropriate exposure of the glenoid.  A circumferential labral resection was then completed.  A guidepin directed into the center of the glenoid with an approximate 10 degree inferior tilt and the glenoid was then reamed with the central followed by the peripheral reamers to a stable subchondral bony  bed.  All soft tissue debris was then carefully  removed.  Preparation was then completed with the central drill and tap and a 30 mm lag screw was selected.  Our baseplate was assembled and our lag screw was then inserted after vancomycin powder had been applied to the threads and excellent purchase and fixation was achieved.  All the peripheral locking screws were then placed using standard technique of achieving excellent purchase and fixation.  A 39/+4 glenosphere was then impacted onto the baseplate and a central locking screw was placed.  Our attention then turned to the humeral metaphysis where we did remove several of the previous placed suture anchors.  Canal was then opened by hand reaming to a size 7 and ultimately broaching to a size 8 with excellent fit and fixation at 20 degrees retroversion.  The metaphysis was then prepared with the neutral reaming guide.  A trial implant was placed which showed excellent mobility soft tissue balance and stability.  Our trial was removed and the final implant was then assembled.  The canal was cleaned and dried and vancomycin powder was then sprayed liberally into the canal.  Our final implant was then seated with excellent fit and fixation.  A series of trial reductions were then performed and we felt that the +6 polyethylene insert gave [place] the best motion stability and soft tissue balance.  The trial polyp was removed the implant was cleaned and dried and the final +6 polyp was then impacted.  Final reduction was performed again showing excellent motion stability and soft tissue balance.  We then confirmed good mobility of the subscapularis which we then repaired back to the eyelets on the collar of the implant using the previously placed FiberWire sutures.  The arm easily achieved 45 degrees of external rotation without excessive tension on the subscapularis.  The wound was then copiously irrigated.  Final hemostasis was obtained.  Balance of the vancomycin powder was then spread liberally throughout the deep  soft tissue planes.  The deltopectoral interval was reapproximated with a series of figure-of-eight #1 Vicryl sutures.  2-0 Monocryl used to the subcu layer and intracuticular 3-0 Monocryl for the skin followed by Dermabond and Aquacel dressing.  The right arm was then placed into a sling and the patient was awakened, extubated, and taken to the recovery in stable condition.  Jenetta Loges, PA-C was utilized as an Environmental consultant throughout this case, essential for help with positioning the patient, positioning extremity, tissue manipulation, implantation of the prosthesis, suture management, wound closure, and intraoperative decision-making.  Marin Shutter MD   Contact # [phone]

## 2021-08-18 NOTE — Transfer of Care (Signed)
Immediate Anesthesia Transfer of Care Note  Patient: Colin [name]  Procedure(s) Performed: REVERSE SHOULDER ARTHROPLASTY (Right: Shoulder)  Patient Location: PACU  Anesthesia Type:GA combined with regional for post-op pain  Level of Consciousness: awake, drowsy and patient cooperative  Airway & Oxygen Therapy: Patient Spontanous Breathing and Patient connected to face mask oxygen  Post-op Assessment: Report given to RN and Post -op Vital signs reviewed and stable  Post vital signs: Reviewed and stable  Last Vitals:  Vitals Value Taken Time  BP 172/89 08/18/21 0939  Temp 36.4 C 08/18/21 0939  Pulse 51 08/18/21 0941  Resp 16 08/18/21 0941  SpO2 100 % 08/18/21 0941  Vitals shown include unvalidated device data.  Last Pain:  Vitals:   08/18/21 0554  TempSrc:   PainSc: 0-No pain      Patients Stated Pain Goal: 4 (88/82/80 0349)  Complications: No notable events documented.

## 2021-08-18 NOTE — Anesthesia Procedure Notes (Signed)
Procedure Name: Intubation Date/Time: 08/18/2021 7:42 AM Performed by: West Pugh, CRNA Pre-anesthesia Checklist: Patient identified, Emergency Drugs available, Suction available, Patient being monitored and Timeout performed Patient Re-evaluated:Patient Re-evaluated prior to induction Oxygen Delivery Method: Circle system utilized Preoxygenation: Pre-oxygenation with 100% oxygen Induction Type: IV induction Ventilation: Mask ventilation without difficulty Laryngoscope Size: Mac and 4 Grade View: Grade II Tube type: Oral Tube size: 7.5 mm Number of attempts: 1 Airway Equipment and Method: Stylet Placement Confirmation: ETT inserted through vocal cords under direct vision, positive ETCO2, CO2 detector and breath sounds checked- equal and bilateral Secured at: 23 cm Tube secured with: Tape Dental Injury: Teeth and Oropharynx as per pre-operative assessment

## 2021-08-18 NOTE — H&P (Signed)
Colin [name]    Chief Complaint: Right shoulder rotator cuff tear arthropathy HPI: The patient is a 70 y.o. male with history of a right shoulder injury with massive rotator cuff tear, status post arthroscopic repair with subsequent recurrent rotator cuff tear.  Now with significantly increased pain and progressive functional limitations related to a rotator cuff tear arthropathy.  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   TIA (transient ischemic attack) 03/2020   Wears glasses     Past Surgical History:  Procedure Laterality Date   APPENDECTOMY     CARPAL TUNNEL RELEASE  04/23/2012   Procedure: CARPAL TUNNEL RELEASE;  Surgeon: Cammie Sickle., MD;  Location: [facility];  Service: Orthopedics;  Laterality: Left;   CARPAL TUNNEL RELEASE  06/27/2012   Procedure: CARPAL TUNNEL RELEASE;  Surgeon: Cammie Sickle., MD;  Location: [facility];  Service: Orthopedics;  Laterality: Right;   CERVICAL DISCECTOMY  2007   colonscopy     polyps removed / benign   INCISIONAL HERNIA REPAIR  2011   subcostal from append   JOINT REPLACEMENT  81,87   lt total hip   LAPAROSCOPIC APPENDECTOMY  2010   required open repair   LOOP RECORDER INSERTION N/A 03/25/2020   Procedure: LOOP RECORDER INSERTION;  Surgeon: Thompson Grayer, MD;  Location: [facility];  Service: Cardiovascular;  Laterality: N/A;    SHOULDER ARTHROSCOPY WITH ROTATOR CUFF REPAIR Right 02/12/2020   Procedure: SHOULDER ARTHROSCOPY WITH ROTATOR CUFF REPAIR with subacromial decompression distal clavicle resection biceps tenotomy labral debridement;  Surgeon: Sydnee Cabal, MD;  Location: [facility];  Service: Orthopedics;  Laterality: Right;  interscalene block   TONSILLECTOMY     TOTAL HIP ARTHROPLASTY     x3 left hip [phone]   TOTAL HIP ARTHROPLASTY Right 04/05/2016   Procedure: RIGHT TOTAL HIP ARTHROPLASTY ANTERIOR APPROACH;  Surgeon: Gaynelle Arabian, MD;  Location: [facility];  Service: Orthopedics;  Laterality: Right;    Family History  Problem Relation Age of Onset   Breast cancer Mother    Hypertension Mother    Heart disease Father    Lung cancer Father    Hypertension Father    Breast cancer Sister    Stroke Maternal Grandmother     Social History:  reports that he has never smoked. He has never used smokeless tobacco. He reports current alcohol use. He reports that he does not use drugs.   Medications Prior to Admission  Medication Sig Dispense Refill   clopidogrel (PLAVIX) 75 MG tablet Take 75 mg by mouth daily.     levothyroxine (SYNTHROID) 75 MCG tablet Take 75 mcg by mouth daily.      losartan-hydrochlorothiazide (HYZAAR) 50-12.5 MG tablet Take 1 tablet by mouth daily.     Omega-3 Fatty Acids (  FISH OIL) 1000 MG CAPS Take 1,000 mg by mouth daily.      pantoprazole (PROTONIX) 40 MG tablet Take 40 mg by mouth daily as needed for heartburn or indigestion.     rosuvastatin (CRESTOR) 20 MG tablet Take 1 tablet (20 mg total) by mouth daily. 90 tablet 1   sildenafil (VIAGRA) 25 MG tablet Take 25 mg by mouth daily as needed for erectile dysfunction.       Physical Exam: Right shoulder demonstrates painful and limited motion with global weakness is noted at recent office visits.  Previous arthroscopy portals are well-healed.  He is neurovascular intact in the right upper extremity.  Recent plain film x-rays  confirm advanced shoulder arthritis.  MRI scan confirms a massive retracted chronic rotator cuff tear with narrowing of the acromiohumeral interval  Vitals  Temp:  [98.1 F (36.7 C)] 98.1 F (36.7 C) (12/01 0543) Pulse Rate:  [53] 53 (12/01 0543) Resp:  [18] 18 (12/01 0543) BP: (126)/(71) 126/71 (12/01 0543) SpO2:  [100 %] 100 % (12/01 0543) Weight:  [113.4 kg] 113.4 kg (12/01 0554)  Assessment/Plan  Impression: Right shoulder rotator cuff tear arthropathy  Plan of Action: Procedure(s): REVERSE SHOULDER ARTHROPLASTY  Cheryl Stabenow M Kaya Pottenger 08/18/2021, 6:26 AM Contact # [phone]

## 2021-08-18 NOTE — Anesthesia Procedure Notes (Signed)
Anesthesia Regional Block: Interscalene brachial plexus block   Pre-Anesthetic Checklist: , timeout performed,  Correct Patient, Correct Site, Correct Laterality,  Correct Procedure, Correct Position, site marked,  Risks and benefits discussed,  Surgical consent,  Pre-op evaluation,  At surgeon's request and post-op pain management  Laterality: Right  Prep: chloraprep       Needles:  Injection technique: Single-shot  Needle Type: Echogenic Stimulator Needle     Needle Length: 5cm  Needle Gauge: 22     Additional Needles:   Narrative:  Start time: 08/18/2021 7:07 AM End time: 08/18/2021 7:17 AM Injection made incrementally with aspirations every 5 mL.  Performed by: Personally  Anesthesiologist: Duane Boston, MD  Additional Notes: Functioning IV was confirmed and monitors applied.  A 27mm 22ga echogenic arrow stimulator was used. Sterile prep and drape,hand hygiene and sterile gloves were used.Ultrasound guidance: relevant anatomy identified, needle position confirmed, local anesthetic spread visualized around nerve(s)., vascular puncture avoided.  Image printed for medical record.  Negative aspiration and negative test dose prior to incremental administration of local anesthetic. The patient tolerated the procedure well.

## 2021-08-18 NOTE — Discharge Instructions (Signed)
 Kevin M. Supple, M.D., F.A.A.O.S. Orthopaedic Surgery Specializing in Arthroscopic and Reconstructive Surgery of the Shoulder [phone] [address] - [place], [place] [address] - Fax [phone]   POST-OP TOTAL SHOULDER REPLACEMENT INSTRUCTIONS  1. Follow up in the office for your first post-op appointment 10-14 days from the date of your surgery. If you do not already have a scheduled appointment, our office will contact you to schedule.  2. The bandage over your incision is waterproof. You may begin showering with this dressing on. You may leave this dressing on until first follow up appointment within 2 weeks. We prefer you leave this dressing in place until follow up however after 5-7 days if you are having itching or skin irritation and would like to remove it you may do so. Go slow and tug at the borders gently to break the bond the dressing has with the skin. At this point if there is no drainage it is okay to go without a bandage or you may cover it with a light guaze and tape. You can also expect significant bruising around your shoulder that will drift down your arm and into your chest wall. This is very normal and should resolve over several days.   3. Wear your sling/immobilizer at all times except to perform the exercises below or to occasionally let your arm dangle by your side to stretch your elbow. You also need to sleep in your sling immobilizer until instructed otherwise. It is ok to remove your sling if you are sitting in a controlled environment and allow your arm to rest in a position of comfort by your side or on your lap with pillows to give your neck and skin a break from the sling. You may remove it to allow arm to dangle by side to shower. If you are up walking around and when you go to sleep at night you need to wear it.  4. Range of motion to your elbow, wrist, and hand are encouraged 3-5 times daily. Exercise to your hand and fingers helps to reduce  swelling you may experience.   5. Prescriptions for a pain medication and a muscle relaxant are provided for you. It is recommended that if you are experiencing pain that you pain medication alone is not controlling, add the muscle relaxant along with the pain medication which can give additional pain relief. The first 1-2 days is generally the most severe of your pain and then should gradually decrease. As your pain lessens it is recommended that you decrease your use of the pain medications to an "as needed basis'" only and to always comply with the recommended dosages of the pain medications.  6. Pain medications can produce constipation along with their use. If you experience this, the use of an over the counter stool softener or laxative daily is recommended.   7. For additional questions or concerns, please do not hesitate to call the office. If after hours there is an answering service to forward your concerns to the physician on call.  8.Pain control following an exparel block  To help control your post-operative pain you received a nerve block  performed with Exparel which is a long acting anesthetic (numbing agent) which can provide pain relief and sensations of numbness (and relief of pain) in the operative shoulder and arm for up to 3 days. Sometimes it provides mixed relief, meaning you may still have numbness in certain areas of the arm but can still be able to   move  parts of that arm, hand, and fingers. We recommend that your prescribed pain medications  be used as needed. We do not feel it is necessary to "pre medicate" and "stay ahead" of pain.  Taking narcotic pain medications when you are not having any pain can lead to unnecessary and potentially dangerous side effects.    9. Use the ice machine as much as possible in the first 5-7 days from surgery, then you can wean its use to as needed. The ice typically needs to be replaced every 6 hours, instead of ice you can actually freeze  water bottles to put in the cooler and then fill water around them to avoid having to purchase ice. You can have spare water bottles freezing to allow you to rotate them once they have melted. Try to have a thin shirt or light cloth or towel under the ice wrap to protect your skin.   FOR ADDITIONAL INFO ON ICE MACHINE AND INSTRUCTIONS GO TO THE WEBSITE AT  [email or web address]/[email or web address]/[email or web address]  10.  We recommend that you avoid any dental work or cleaning in the first 3 months following your joint replacement. This is to help minimize the possibility of infection from the bacteria in your mouth that enters your bloodstream during dental work. We also recommend that you take an antibiotic prior to your dental work for the first year after your shoulder replacement to further help reduce that risk. Please simply contact our office for antibiotics to be sent to your pharmacy prior to dental work.  11. Dental Antibiotics:  In most cases prophylactic antibiotics for Dental procdeures after total joint surgery are not necessary.  Exceptions are as follows:  1. History of prior total joint infection  2. Severely immunocompromised (Organ Transplant, cancer chemotherapy, Rheumatoid biologic meds such as Humera)  3. Poorly controlled diabetes (A1C &gt; 8.0, blood glucose over 200)  If you have one of these conditions, contact your surgeon for an antibiotic prescription, prior to your dental procedure.   POST-OP EXERCISES  Pendulum Exercises  Perform pendulum exercises while standing and bending at the waist. Support your uninvolved arm on a table or chair and allow your operated arm to hang freely. Make sure to do these exercises passively - not using you shoulder muscles. These exercises can be performed once your nerve block effects have worn off.  Repeat 20 times. Do 3 sessions per day.     
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
## 2021-08-22 ENCOUNTER — Encounter (HOSPITAL_COMMUNITY): Payer: Self-pay | Admitting: Orthopedic Surgery

## 2021-08-25 NOTE — Progress Notes (Signed)
Carelink Summary Report / Loop Recorder 

## 2021-08-29 DIAGNOSIS — Z96611 Presence of right artificial shoulder joint: Secondary | ICD-10-CM | POA: Diagnosis not present

## 2021-08-29 DIAGNOSIS — Z471 Aftercare following joint replacement surgery: Secondary | ICD-10-CM | POA: Diagnosis not present

## 2021-08-30 DIAGNOSIS — M19011 Primary osteoarthritis, right shoulder: Secondary | ICD-10-CM | POA: Diagnosis not present

## 2021-08-31 ENCOUNTER — Ambulatory Visit (HOSPITAL_BASED_OUTPATIENT_CLINIC_OR_DEPARTMENT_OTHER): Payer: Medicare Other | Attending: Pulmonary Disease | Admitting: Pulmonary Disease

## 2021-08-31 DIAGNOSIS — G4733 Obstructive sleep apnea (adult) (pediatric): Secondary | ICD-10-CM

## 2021-09-01 DIAGNOSIS — M19011 Primary osteoarthritis, right shoulder: Secondary | ICD-10-CM | POA: Diagnosis not present

## 2021-09-01 NOTE — Procedures (Signed)
° ° °  He came to sleep lab for CPAP mask adjustment.  On arrival to sleep lab he informed the staff he was no longer having issues with CPAP mask fit.  [place] PH: [phone]   FX: [phone] [place]

## 2021-09-06 DIAGNOSIS — M19011 Primary osteoarthritis, right shoulder: Secondary | ICD-10-CM | POA: Diagnosis not present

## 2021-09-08 DIAGNOSIS — M19011 Primary osteoarthritis, right shoulder: Secondary | ICD-10-CM | POA: Diagnosis not present

## 2021-09-13 DIAGNOSIS — M19011 Primary osteoarthritis, right shoulder: Secondary | ICD-10-CM | POA: Diagnosis not present

## 2021-09-15 DIAGNOSIS — M19011 Primary osteoarthritis, right shoulder: Secondary | ICD-10-CM | POA: Diagnosis not present

## 2021-09-19 DIAGNOSIS — M19011 Primary osteoarthritis, right shoulder: Secondary | ICD-10-CM | POA: Diagnosis not present

## 2021-09-20 ENCOUNTER — Ambulatory Visit (INDEPENDENT_AMBULATORY_CARE_PROVIDER_SITE_OTHER): Payer: Medicare Other

## 2021-09-20 DIAGNOSIS — I639 Cerebral infarction, unspecified: Secondary | ICD-10-CM

## 2021-09-20 LAB — CUP PACEART REMOTE DEVICE CHECK
Date Time Interrogation Session: 20230102230848
Implantable Pulse Generator Implant Date: 20210708

## 2021-09-21 DIAGNOSIS — M170 Bilateral primary osteoarthritis of knee: Secondary | ICD-10-CM | POA: Diagnosis not present

## 2021-09-21 DIAGNOSIS — M25561 Pain in right knee: Secondary | ICD-10-CM | POA: Diagnosis not present

## 2021-09-21 DIAGNOSIS — M25562 Pain in left knee: Secondary | ICD-10-CM | POA: Diagnosis not present

## 2021-09-21 DIAGNOSIS — M19011 Primary osteoarthritis, right shoulder: Secondary | ICD-10-CM | POA: Diagnosis not present

## 2021-09-21 DIAGNOSIS — M1712 Unilateral primary osteoarthritis, left knee: Secondary | ICD-10-CM | POA: Diagnosis not present

## 2021-09-27 DIAGNOSIS — M19011 Primary osteoarthritis, right shoulder: Secondary | ICD-10-CM | POA: Diagnosis not present

## 2021-09-28 NOTE — Progress Notes (Signed)
Carelink Summary Report / Loop Recorder 

## 2021-09-29 DIAGNOSIS — M19011 Primary osteoarthritis, right shoulder: Secondary | ICD-10-CM | POA: Diagnosis not present

## 2021-09-29 DIAGNOSIS — M25562 Pain in left knee: Secondary | ICD-10-CM | POA: Diagnosis not present

## 2021-09-29 DIAGNOSIS — M1712 Unilateral primary osteoarthritis, left knee: Secondary | ICD-10-CM | POA: Diagnosis not present

## 2021-09-29 DIAGNOSIS — M25561 Pain in right knee: Secondary | ICD-10-CM | POA: Diagnosis not present

## 2021-10-04 DIAGNOSIS — M19011 Primary osteoarthritis, right shoulder: Secondary | ICD-10-CM | POA: Diagnosis not present

## 2021-10-05 DIAGNOSIS — M25562 Pain in left knee: Secondary | ICD-10-CM | POA: Diagnosis not present

## 2021-10-05 DIAGNOSIS — M1712 Unilateral primary osteoarthritis, left knee: Secondary | ICD-10-CM | POA: Diagnosis not present

## 2021-10-10 DIAGNOSIS — M19011 Primary osteoarthritis, right shoulder: Secondary | ICD-10-CM | POA: Diagnosis not present

## 2021-10-13 DIAGNOSIS — M19011 Primary osteoarthritis, right shoulder: Secondary | ICD-10-CM | POA: Diagnosis not present

## 2021-10-19 DIAGNOSIS — M19011 Primary osteoarthritis, right shoulder: Secondary | ICD-10-CM | POA: Diagnosis not present

## 2021-10-24 ENCOUNTER — Ambulatory Visit (INDEPENDENT_AMBULATORY_CARE_PROVIDER_SITE_OTHER): Payer: Medicare Other

## 2021-10-24 DIAGNOSIS — I639 Cerebral infarction, unspecified: Secondary | ICD-10-CM | POA: Diagnosis not present

## 2021-10-24 DIAGNOSIS — M19011 Primary osteoarthritis, right shoulder: Secondary | ICD-10-CM | POA: Diagnosis not present

## 2021-10-24 LAB — CUP PACEART REMOTE DEVICE CHECK
Date Time Interrogation Session: 20230205230304
Implantable Pulse Generator Implant Date: 20210708

## 2021-10-26 NOTE — Progress Notes (Signed)
Carelink Summary Report / Loop Recorder 

## 2021-10-27 DIAGNOSIS — M19011 Primary osteoarthritis, right shoulder: Secondary | ICD-10-CM | POA: Diagnosis not present

## 2021-11-03 DIAGNOSIS — M19011 Primary osteoarthritis, right shoulder: Secondary | ICD-10-CM | POA: Diagnosis not present

## 2021-11-07 DIAGNOSIS — M19011 Primary osteoarthritis, right shoulder: Secondary | ICD-10-CM | POA: Diagnosis not present

## 2021-11-10 DIAGNOSIS — M19011 Primary osteoarthritis, right shoulder: Secondary | ICD-10-CM | POA: Diagnosis not present

## 2021-11-14 DIAGNOSIS — M19011 Primary osteoarthritis, right shoulder: Secondary | ICD-10-CM | POA: Diagnosis not present

## 2021-11-14 DIAGNOSIS — Z4789 Encounter for other orthopedic aftercare: Secondary | ICD-10-CM | POA: Diagnosis not present

## 2021-11-14 DIAGNOSIS — Z471 Aftercare following joint replacement surgery: Secondary | ICD-10-CM | POA: Diagnosis not present

## 2021-11-14 DIAGNOSIS — Z96611 Presence of right artificial shoulder joint: Secondary | ICD-10-CM | POA: Diagnosis not present

## 2021-11-18 DIAGNOSIS — M19011 Primary osteoarthritis, right shoulder: Secondary | ICD-10-CM | POA: Diagnosis not present

## 2021-11-22 DIAGNOSIS — M19011 Primary osteoarthritis, right shoulder: Secondary | ICD-10-CM | POA: Diagnosis not present

## 2021-11-24 DIAGNOSIS — M19011 Primary osteoarthritis, right shoulder: Secondary | ICD-10-CM | POA: Diagnosis not present

## 2021-11-28 ENCOUNTER — Ambulatory Visit (INDEPENDENT_AMBULATORY_CARE_PROVIDER_SITE_OTHER): Payer: Medicare Other

## 2021-11-28 DIAGNOSIS — I639 Cerebral infarction, unspecified: Secondary | ICD-10-CM

## 2021-11-28 DIAGNOSIS — M19011 Primary osteoarthritis, right shoulder: Secondary | ICD-10-CM | POA: Diagnosis not present

## 2021-11-28 LAB — CUP PACEART REMOTE DEVICE CHECK
Date Time Interrogation Session: 20230313114242
Implantable Pulse Generator Implant Date: 20210708

## 2021-12-01 DIAGNOSIS — M19011 Primary osteoarthritis, right shoulder: Secondary | ICD-10-CM | POA: Diagnosis not present

## 2021-12-05 DIAGNOSIS — M19011 Primary osteoarthritis, right shoulder: Secondary | ICD-10-CM | POA: Diagnosis not present

## 2021-12-08 DIAGNOSIS — M19011 Primary osteoarthritis, right shoulder: Secondary | ICD-10-CM | POA: Diagnosis not present

## 2021-12-08 NOTE — Progress Notes (Signed)
Carelink Summary Report / Loop Recorder 

## 2021-12-12 DIAGNOSIS — M19011 Primary osteoarthritis, right shoulder: Secondary | ICD-10-CM | POA: Diagnosis not present

## 2021-12-15 DIAGNOSIS — M19011 Primary osteoarthritis, right shoulder: Secondary | ICD-10-CM | POA: Diagnosis not present

## 2022-01-02 ENCOUNTER — Ambulatory Visit (INDEPENDENT_AMBULATORY_CARE_PROVIDER_SITE_OTHER): Payer: Medicare Other

## 2022-01-02 DIAGNOSIS — I639 Cerebral infarction, unspecified: Secondary | ICD-10-CM | POA: Diagnosis not present

## 2022-01-02 LAB — CUP PACEART REMOTE DEVICE CHECK
Date Time Interrogation Session: 20230414230928
Implantable Pulse Generator Implant Date: 20210708

## 2022-01-18 DIAGNOSIS — M48062 Spinal stenosis, lumbar region with neurogenic claudication: Secondary | ICD-10-CM | POA: Diagnosis not present

## 2022-01-18 DIAGNOSIS — M419 Scoliosis, unspecified: Secondary | ICD-10-CM | POA: Diagnosis not present

## 2022-01-18 DIAGNOSIS — M5104 Intervertebral disc disorders with myelopathy, thoracic region: Secondary | ICD-10-CM | POA: Diagnosis not present

## 2022-01-18 NOTE — Progress Notes (Signed)
Carelink Summary Report / Loop Recorder 

## 2022-01-27 DIAGNOSIS — M47816 Spondylosis without myelopathy or radiculopathy, lumbar region: Secondary | ICD-10-CM | POA: Diagnosis not present

## 2022-01-27 DIAGNOSIS — M5136 Other intervertebral disc degeneration, lumbar region: Secondary | ICD-10-CM | POA: Diagnosis not present

## 2022-01-27 DIAGNOSIS — M5184 Other intervertebral disc disorders, thoracic region: Secondary | ICD-10-CM | POA: Diagnosis not present

## 2022-01-27 DIAGNOSIS — M4804 Spinal stenosis, thoracic region: Secondary | ICD-10-CM | POA: Diagnosis not present

## 2022-01-27 DIAGNOSIS — D1809 Hemangioma of other sites: Secondary | ICD-10-CM | POA: Diagnosis not present

## 2022-01-27 DIAGNOSIS — M5137 Other intervertebral disc degeneration, lumbosacral region: Secondary | ICD-10-CM | POA: Diagnosis not present

## 2022-01-27 DIAGNOSIS — M48062 Spinal stenosis, lumbar region with neurogenic claudication: Secondary | ICD-10-CM | POA: Diagnosis not present

## 2022-01-27 DIAGNOSIS — M5134 Other intervertebral disc degeneration, thoracic region: Secondary | ICD-10-CM | POA: Diagnosis not present

## 2022-01-27 DIAGNOSIS — M47814 Spondylosis without myelopathy or radiculopathy, thoracic region: Secondary | ICD-10-CM | POA: Diagnosis not present

## 2022-02-06 ENCOUNTER — Ambulatory Visit (INDEPENDENT_AMBULATORY_CARE_PROVIDER_SITE_OTHER): Payer: Medicare Other

## 2022-02-06 DIAGNOSIS — I639 Cerebral infarction, unspecified: Secondary | ICD-10-CM

## 2022-02-07 DIAGNOSIS — H2513 Age-related nuclear cataract, bilateral: Secondary | ICD-10-CM | POA: Diagnosis not present

## 2022-02-08 LAB — CUP PACEART REMOTE DEVICE CHECK
Date Time Interrogation Session: 20230524090012
Implantable Pulse Generator Implant Date: 20210708

## 2022-02-10 DIAGNOSIS — M419 Scoliosis, unspecified: Secondary | ICD-10-CM | POA: Diagnosis not present

## 2022-02-10 DIAGNOSIS — Z6833 Body mass index (BMI) 33.0-33.9, adult: Secondary | ICD-10-CM | POA: Diagnosis not present

## 2022-02-10 DIAGNOSIS — M48062 Spinal stenosis, lumbar region with neurogenic claudication: Secondary | ICD-10-CM | POA: Diagnosis not present

## 2022-02-10 DIAGNOSIS — M5104 Intervertebral disc disorders with myelopathy, thoracic region: Secondary | ICD-10-CM | POA: Diagnosis not present

## 2022-02-23 NOTE — Progress Notes (Signed)
Carelink Summary Report / Loop Recorder 

## 2022-03-13 ENCOUNTER — Ambulatory Visit (INDEPENDENT_AMBULATORY_CARE_PROVIDER_SITE_OTHER): Payer: Medicare Other

## 2022-03-13 DIAGNOSIS — I639 Cerebral infarction, unspecified: Secondary | ICD-10-CM

## 2022-03-14 LAB — CUP PACEART REMOTE DEVICE CHECK
Date Time Interrogation Session: 20230627135032
Implantable Pulse Generator Implant Date: 20210708

## 2022-03-29 DIAGNOSIS — M5104 Intervertebral disc disorders with myelopathy, thoracic region: Secondary | ICD-10-CM | POA: Diagnosis not present

## 2022-03-29 DIAGNOSIS — M419 Scoliosis, unspecified: Secondary | ICD-10-CM | POA: Diagnosis not present

## 2022-03-29 DIAGNOSIS — M48062 Spinal stenosis, lumbar region with neurogenic claudication: Secondary | ICD-10-CM | POA: Diagnosis not present

## 2022-03-29 DIAGNOSIS — Z6833 Body mass index (BMI) 33.0-33.9, adult: Secondary | ICD-10-CM | POA: Diagnosis not present

## 2022-04-04 ENCOUNTER — Telehealth: Payer: Self-pay | Admitting: *Deleted

## 2022-04-04 ENCOUNTER — Telehealth: Payer: Self-pay

## 2022-04-04 NOTE — Telephone Encounter (Signed)
From Dr. Leonie Man:  Patient was last seen in the office 2 years ago. Kindly call the patient and see if he is doing all right and if he has not had any recurrent TIA or stroke symptoms then he may be neurologically cleared to undergo his surgery and stop his Plavix 3 to 5 days prior to the surgery due to small but acceptable peri procedural risk of TIA/stroke. I do not necessarily need to see him to provide clearance as long as we can contact the patient and document that he is doing well.  _____________________________________ I spoke to the patient. He denies any further stroke-like symptoms or TIA since last being seen in our office. Reviewed the information above concerning his Plavix and peri-procedural risk.

## 2022-04-04 NOTE — Telephone Encounter (Signed)
ATC pt to schedule for OV with JE to review surgical clearance. If pt calls back please schedule for OV with JE even if in held slot. Ok'd by JE.

## 2022-04-04 NOTE — Telephone Encounter (Signed)
   Pre-operative Risk Assessment    Patient Name: Colin [name]  DOB: [birth year] MRN: [id number]      Request for Surgical Clearance    Procedure:   L3-4 OLIF w/perc screws  Date of Surgery:  Clearance TBD                                 Surgeon:  max cohen Surgeon's Group or Practice Name:  [facility] Phone number:  [phone] Fax number:  [phone]   Type of Clearance Requested:   - Pharmacy:  Hold Clopidogrel (Plavix) for 7 days prior   Type of Anesthesia:  General    Additional requests/questions:    [name]   04/04/2022, 1:45 PM

## 2022-04-04 NOTE — Telephone Encounter (Signed)
   Patient Name: Colin [name]  DOB: 1950/11/12 MRN: [id number]  Primary Cardiologist: Thompson Grayer, MD  Chart reviewed as part of pre-operative protocol coverage.   From prior surgical clearance note "Patient's Plavix is prescribed by his neurologist.  He will need to receive recommendations on Plavix hold from prescribing provider".   I will route this recommendation to the requesting party via Epic fax function and remove from pre-op pool.  Please call with questions.  [place], [place] 04/04/2022, 3:24 PM

## 2022-04-04 NOTE — Telephone Encounter (Signed)
Received surgical clearance rq from [facility]. This pt hasn't been seen in almost 2 yrs, 04/2020. You released him back to PCP. Do you want to see pt again before signing clearance or will you sign? Form placed on MD desk for review.

## 2022-04-04 NOTE — Telephone Encounter (Signed)
Dr Leonie Man has agreed for this pt to be cleared for surgery, are you willing to sign?

## 2022-04-04 NOTE — Telephone Encounter (Signed)
Noted, forms placed on Dr Leonie Man Desk for review

## 2022-04-05 NOTE — Telephone Encounter (Signed)
Form placed in Dr Leonie Man basket for [employer]

## 2022-04-06 ENCOUNTER — Telehealth: Payer: Self-pay | Admitting: Pulmonary Disease

## 2022-04-06 ENCOUNTER — Encounter: Payer: Self-pay | Admitting: Pulmonary Disease

## 2022-04-06 ENCOUNTER — Ambulatory Visit (INDEPENDENT_AMBULATORY_CARE_PROVIDER_SITE_OTHER): Payer: Medicare Other | Admitting: Pulmonary Disease

## 2022-04-06 VITALS — BP 130/72 | HR 72 | Temp 98.4°F | Ht 73.0 in | Wt 258.6 lb

## 2022-04-06 DIAGNOSIS — Z01818 Encounter for other preprocedural examination: Secondary | ICD-10-CM

## 2022-04-06 DIAGNOSIS — G4733 Obstructive sleep apnea (adult) (pediatric): Secondary | ICD-10-CM

## 2022-04-06 NOTE — Telephone Encounter (Signed)
OV notes and clearance form have been faxed back to [facility]. Nothing further needed at this time.  

## 2022-04-06 NOTE — Telephone Encounter (Signed)
Fax received from Dr. Rennis Harding with [facility] to perform a L3-L4 OLIF with perc screws on patient.  Patient needs surgery clearance. Surgery is PENDING. Patient was seen on 04/06/2022. Office protocol is a risk assessment can be sent to surgeon if patient has been seen in 60 days or less.   Sending to Dr Loanne Drilling for risk assessment or recommendations if patient needs to be seen in office prior to surgical procedure.

## 2022-04-06 NOTE — Patient Instructions (Addendum)
OSA Discussed the long term cardiovascular benefits of treating sleep apnea, including improved blood pressure control, reduction in MI and stroke risk as well as other potential benefits of treatment, such as improved glycemic control, facilitation of weight loss, improved energy during the day and improved sleep quality. --Compliance report reviewed and usage >70%   Peri-operative Assessment of Pulmonary Risk for Non-Thoracic Surgery:  For Colin [name], risk of perioperative pulmonary complications is increased by:  [ X]Age greater than 65 years  '[ ]'$ COPD  '[ ]'$ Serum albumin <3.5  '[ ]'$ Smoking  [ X]Obstructive sleep apnea  '[ ]'$ NYHA Class II Pulmonary Hypertension  Respiratory complications generally occur in 1% of ASA Class I patients, 5% of ASA Class II and 10% of ASA Class III-IV patients These complications rarely result in mortality and include postoperative pneumonia, atelectasis, pulmonary embolism, ARDS and increased time requiring postoperative mechanical ventilation.  Overall, I recommend proceeding with the surgery if the risk for respiratory complications are outweighed by the potential benefits. This will need to be discussed between the patient and surgeon.  To reduce risks of respiratory complications, I recommend: --Pre- and post-operative incentive spirometry performed frequently while awake --Inpatient use of currently prescribed positive-pressure for OSA whenever the patient is sleeping --Avoiding use of pancuronium during anesthesia. --OOB, encourage mobility post-op  I have discussed the risk factors and recommendations above with the patient.  Follow-up with me in 6 months

## 2022-04-06 NOTE — Progress Notes (Signed)
Subjective:   PATIENT ID: Colin [name] GENDER: male DOB: 20-Feb-1951, MRN: [id number]   HPI Chief Complaint  Patient presents with   Follow-up    Pt is here to be cleared for surgery.    Reason for Visit: Follow-up and pre-op clearance  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  Synopsis 2013 - Seen by [facility] with Dr. [name] for [place]. Normal PFTs.  2020 - Hospitalized at [facility] from 8/30-9/8 for COVID-19 pneumonia. Remained oxygen dependent with PFTs showing restrictive defect and moderated DLCO reduction. Started on prolonged prednisone taper in November 2021 - Off steroids in July. PFTs normalized. Hospitalized for TIA in July. Diagnosed with OSA. 2022 - OSA not controlled on CPAP. Started on BiPAP in June.  04/06/22 Since our last visit he was scheduled for mask fitting however stated he was no longer having issues on appointment visit. He is compliant with his BiPAP with 100% usage and >97% wearing for >4 hours. He is planning for surgery for his lumbar stenosis and being seen today for pre-op clearance. Denies recent respiratory infection. Did have ear infection earlier this month with resolution. Denies shortness of breath, cough or wheezing.  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     levothyroxine (SYNTHROID) 75 MCG tablet Take 75 mcg by mouth daily.      losartan-hydrochlorothiazide (HYZAAR) 50-12.5 MG tablet Take 1 tablet by mouth daily.     Omega-3 Fatty Acids (FISH OIL) 1000 MG CAPS Take 1,000 mg by mouth daily.      pantoprazole (PROTONIX) 40 MG tablet Take 40 mg by mouth daily as needed for heartburn or indigestion.     rosuvastatin (CRESTOR) 20 MG tablet Take 1 tablet (20 mg total) by mouth daily. 90 tablet 1   sildenafil (VIAGRA) 25 MG tablet Take 25 mg by mouth daily as needed for erectile dysfunction.     cyclobenzaprine (FLEXERIL) 10 MG tablet Take 1 tablet (10 mg total) by mouth 3 (three) times daily as needed for muscle spasms. 30 tablet 1   naproxen (NAPROSYN) 500 MG tablet Take 1 tablet (500 mg total) by mouth 2 (two) times daily with a meal. 60 tablet 1   ondansetron (ZOFRAN) 4 MG tablet Take 1 tablet (4 mg total) by mouth every 8 (eight) hours as needed for nausea or vomiting. 10 tablet 0   oxyCODONE-acetaminophen (PERCOCET) 5-325 MG tablet Take 1 tablet by mouth every 4 (four) hours  as needed (max 6 q). 20 tablet 0   No facility-administered medications prior to visit.    Review of Systems  Constitutional:  Negative for chills, diaphoresis, fever, malaise/fatigue and weight loss.  HENT:  Negative for congestion.   Respiratory:  Negative for cough, hemoptysis, sputum production, shortness of breath and wheezing.   Cardiovascular:  Negative for chest pain, palpitations and leg swelling.   Objective:   Vitals:   04/06/22 1022  BP: 130/72  Pulse: 72  Temp: 98.4 F (36.9 C)  TempSrc: Oral  SpO2: 99%  Weight: 258 lb 9.6 oz (117.3 kg)  Height: '6\' 1"'$  (1.854 m)  SpO2: 99 % O2 Device: None (Room air)  Physical Exam: General:  Well-appearing, no acute distress HENT: [place], AT Eyes: EOMI, no scleral icterus Respiratory: Clear to auscultation bilaterally.  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 Per radiology, slightly progressed compared to prior CXR 08/14/12 - No evidence of infiltrate, effusion or edema CT Chest 10/01/19 - Slightly improved ground glass opacities. Persistent interstitial reticular thickening without honeycombing that is overall stable. CT Chest 03/01/20 - Improved GGO CTA Chest Aorta (lungs) 10/12/20 - Chronic changes of the lung parenchyma involving mid and lower lobes. No honeycombing  PFT: 07/22/19 FVC 3.40 (66%) FEV1 3.03 (79%) Ratio 86  TLC 71% DLCO 56% Interpretation: Mild restrictive defect with moderately reduced gas exchange  05/20/20 FVC 4.24 (83%) FEV1 3.78 (100%) Ratio 89  TLC 82% DLCO 81% Interpretation: Normal spirometry  12/05/19 6MWT - No desaturations on RA. Completed 476 meters  Sleep Split night - AHI = 72.7/hour with Nadir 72%  CPAP Compliance 03/07/22-04/05/22 Usage 30/30 days (100%) >4 hours 29 days (97%) AHI 8.9 on BiPAP 14/10  Assessment & Plan:   Discussion: 71 year old male with severe OSA and hx lung fibrosis secondary to COVID-19 who presents for follow-up. Compliant with BiPAP. No respiratory issues.  OSA Discussed the long term cardiovascular benefits of treating sleep apnea, including improved blood pressure control, reduction in MI and stroke risk as well as other potential benefits of treatment, such as improved glycemic control, facilitation of weight loss, improved  energy during the day and improved sleep quality. --Compliance report reviewed and usage >70% --Counseled on sleep hygiene --Counseled on weight loss/maintenance of healthy weight --Counseled NOT to drive if/when sleepy --Advised patient to wear CPAP for at least 4 hours each night for greater than 70% of the time to avoid the machine being repossessed by insurance.  Post-inflammatory fibrosis secondary to COVID-19 S/p prolonged steroid taper. Residual fibrosis on CT. PFTs have normalized. No further oxygen requirements  Peri-operative Assessment of Pulmonary Risk for Non-Thoracic Surgery:  For Mr. Westhoff, risk of perioperative pulmonary complications is increased by:  [ X]Age greater than 65 years  '[ ]'$ COPD  '[ ]'$ Serum albumin <3.5  '[ ]'$ Smoking  [ X]Obstructive sleep apnea  '[ ]'$ NYHA Class II Pulmonary Hypertension  Respiratory complications generally occur in 1% of ASA Class I patients, 5% of ASA Class II and 10% of ASA Class III-IV patients These complications rarely result in mortality and include postoperative pneumonia, atelectasis, pulmonary embolism, ARDS and increased time requiring postoperative mechanical ventilation.  ARISCAT score: 1.6% risk of in-hospital post-op  pulmonary complications (composite including respiratory failure, respiratory infection, pleural effusion, atelectasis, pneumothorax, bronchospasm, aspiration pneumonitis)  Overall, I recommend proceeding with the surgery if the risk for respiratory complications are outweighed by the potential benefits. This will need to be discussed between the patient and surgeon.  To reduce risks of respiratory complications, I recommend: --Pre- and post-operative incentive spirometry performed frequently while awake --Inpatient use of currently prescribed positive-pressure for OSA whenever the patient is sleeping --Avoiding use of pancuronium during anesthesia. --OOB, encourage mobility post-op  I have discussed the risk  factors and recommendations above with the patient.   Health Maintenance Immunization History  Administered Date(s) Administered   DTaP 02/15/2010   Influenza Split 06/14/2009, 08/02/2010, 07/24/2012, 06/20/2013   Influenza, High Dose Seasonal PF 08/29/2018, 06/16/2019   PFIZER(Purple Top)SARS-COV-2 Vaccination 11/13/2019, 12/11/2019   Pneumococcal Conjugate-13 04/30/2018   Pneumococcal Polysaccharide-23 06/18/2019   Tdap 02/15/2010, 05/11/2020   Zoster, Live 12/04/2012   No orders of the defined types were placed in this encounter.  No orders of the defined types were placed in this encounter.  Return in about 6 months (around 10/07/2022).  I have spent a total time of 39-minutes on the day of the appointment including chart review, data review, collecting history, coordinating care and discussing medical diagnosis and plan with the patient/family. Past medical history, allergies, medications were reviewed. Pertinent imaging, labs and tests included in this note have been reviewed and interpreted independently by me.  [place], MD [facility] 04/06/2022  Office Number [phone]

## 2022-04-06 NOTE — Progress Notes (Signed)
Carelink Summary Report / Loop Recorder 

## 2022-04-12 ENCOUNTER — Telehealth: Payer: Self-pay

## 2022-04-12 NOTE — Telephone Encounter (Signed)
This is a duplicate pharmacy request for the same procedure the patient was previously cleared for.  I have refaxed the previous pharmacy clearance to his surgeon's office.  Please verify with the surgeon's office to see if they have received it.  Note, this is a pharmacy clearance only, there was no medical clearance request.

## 2022-04-12 NOTE — Telephone Encounter (Signed)
LEFT A MESSAGE FOR THE SURGERY SCHEDULE FOR DR. Patrice Paradise. CLEARANCE WAS ORIGINALLY FAXED ON 04/04/22 WITH NOTES IN REGARD TO PLAVIX PRESCRIBED BY NEUROLOGIST. IF ANY FURTHER QUESTIONS, FEEL FREE TO CALL [phone] AND ASK TO S/W THE PRE OP TEAM

## 2022-04-12 NOTE — Telephone Encounter (Signed)
   Pre-operative Risk Assessment    Patient Name: Colin [name]  DOB: 1951/02/13 MRN: [id number]      Request for Surgical Clearance    Procedure:   L3-4 OLIF with Perc Screws  Date of Surgery:  Clearance TBD                                 Surgeon:  Rennis Harding, MD, FAAOS Surgeon's Group or Practice Name:  [facility] Phone number:  [phone]  Fax number:  [phone]   Type of Clearance Requested:   - Pharmacy:  Hold Clopidogrel (Plavix) 3   Type of Anesthesia:  General    Additional requests/questions:    Signed, Elsie Lincoln Ainsleigh Kakos   04/12/2022, 1:36 PM

## 2022-04-12 NOTE — Telephone Encounter (Signed)
LEFT A MESSAGE FOR THE SURGERY SCHEDULE FOR DR. Patrice Paradise. CLEARANCE WAS ORIGINALLY FAXED ON 04/04/22 WITH NOTES IN REGARD TO PLAVIX PRESCRIBED BY NEUROLOGIST. IF ANY FURTHER QUESTIONS, FEEL FREE TO CALL [phone] AND ASK TO S/W THE PRE OP TEAM

## 2022-04-17 ENCOUNTER — Ambulatory Visit (INDEPENDENT_AMBULATORY_CARE_PROVIDER_SITE_OTHER): Payer: Medicare Other

## 2022-04-17 DIAGNOSIS — I639 Cerebral infarction, unspecified: Secondary | ICD-10-CM | POA: Diagnosis not present

## 2022-04-17 LAB — CUP PACEART REMOTE DEVICE CHECK
Date Time Interrogation Session: 20230731084438
Implantable Pulse Generator Implant Date: 20210708

## 2022-04-21 DIAGNOSIS — Z6833 Body mass index (BMI) 33.0-33.9, adult: Secondary | ICD-10-CM | POA: Diagnosis not present

## 2022-04-21 DIAGNOSIS — M419 Scoliosis, unspecified: Secondary | ICD-10-CM | POA: Diagnosis not present

## 2022-04-21 DIAGNOSIS — M48062 Spinal stenosis, lumbar region with neurogenic claudication: Secondary | ICD-10-CM | POA: Diagnosis not present

## 2022-04-25 DIAGNOSIS — Z981 Arthrodesis status: Secondary | ICD-10-CM | POA: Diagnosis not present

## 2022-04-25 DIAGNOSIS — M545 Low back pain, unspecified: Secondary | ICD-10-CM | POA: Diagnosis not present

## 2022-04-25 DIAGNOSIS — M5106 Intervertebral disc disorders with myelopathy, lumbar region: Secondary | ICD-10-CM | POA: Diagnosis not present

## 2022-04-25 DIAGNOSIS — I493 Ventricular premature depolarization: Secondary | ICD-10-CM | POA: Diagnosis not present

## 2022-04-25 DIAGNOSIS — I498 Other specified cardiac arrhythmias: Secondary | ICD-10-CM | POA: Diagnosis not present

## 2022-04-25 DIAGNOSIS — G8929 Other chronic pain: Secondary | ICD-10-CM | POA: Diagnosis not present

## 2022-04-25 DIAGNOSIS — M4326 Fusion of spine, lumbar region: Secondary | ICD-10-CM | POA: Diagnosis not present

## 2022-04-25 DIAGNOSIS — I10 Essential (primary) hypertension: Secondary | ICD-10-CM | POA: Diagnosis not present

## 2022-04-25 DIAGNOSIS — M419 Scoliosis, unspecified: Secondary | ICD-10-CM | POA: Diagnosis not present

## 2022-04-25 DIAGNOSIS — M4186 Other forms of scoliosis, lumbar region: Secondary | ICD-10-CM | POA: Diagnosis not present

## 2022-04-25 DIAGNOSIS — M48062 Spinal stenosis, lumbar region with neurogenic claudication: Secondary | ICD-10-CM | POA: Diagnosis not present

## 2022-04-25 DIAGNOSIS — M5136 Other intervertebral disc degeneration, lumbar region: Secondary | ICD-10-CM | POA: Diagnosis not present

## 2022-04-25 DIAGNOSIS — Z96643 Presence of artificial hip joint, bilateral: Secondary | ICD-10-CM | POA: Diagnosis not present

## 2022-04-25 DIAGNOSIS — M4156 Other secondary scoliosis, lumbar region: Secondary | ICD-10-CM | POA: Diagnosis not present

## 2022-04-25 DIAGNOSIS — M4726 Other spondylosis with radiculopathy, lumbar region: Secondary | ICD-10-CM | POA: Diagnosis not present

## 2022-04-25 DIAGNOSIS — Z8673 Personal history of transient ischemic attack (TIA), and cerebral infarction without residual deficits: Secondary | ICD-10-CM | POA: Diagnosis not present

## 2022-04-27 DIAGNOSIS — Z7902 Long term (current) use of antithrombotics/antiplatelets: Secondary | ICD-10-CM | POA: Diagnosis not present

## 2022-04-27 DIAGNOSIS — Z4789 Encounter for other orthopedic aftercare: Secondary | ICD-10-CM | POA: Diagnosis not present

## 2022-04-27 DIAGNOSIS — M4726 Other spondylosis with radiculopathy, lumbar region: Secondary | ICD-10-CM | POA: Diagnosis not present

## 2022-04-27 DIAGNOSIS — M48062 Spinal stenosis, lumbar region with neurogenic claudication: Secondary | ICD-10-CM | POA: Diagnosis not present

## 2022-04-27 DIAGNOSIS — Z79891 Long term (current) use of opiate analgesic: Secondary | ICD-10-CM | POA: Diagnosis not present

## 2022-04-27 DIAGNOSIS — M4156 Other secondary scoliosis, lumbar region: Secondary | ICD-10-CM | POA: Diagnosis not present

## 2022-04-28 DIAGNOSIS — M4726 Other spondylosis with radiculopathy, lumbar region: Secondary | ICD-10-CM | POA: Diagnosis not present

## 2022-04-28 DIAGNOSIS — M4156 Other secondary scoliosis, lumbar region: Secondary | ICD-10-CM | POA: Diagnosis not present

## 2022-04-28 DIAGNOSIS — M48062 Spinal stenosis, lumbar region with neurogenic claudication: Secondary | ICD-10-CM | POA: Diagnosis not present

## 2022-04-28 DIAGNOSIS — Z4789 Encounter for other orthopedic aftercare: Secondary | ICD-10-CM | POA: Diagnosis not present

## 2022-04-28 DIAGNOSIS — Z7902 Long term (current) use of antithrombotics/antiplatelets: Secondary | ICD-10-CM | POA: Diagnosis not present

## 2022-04-28 DIAGNOSIS — Z79891 Long term (current) use of opiate analgesic: Secondary | ICD-10-CM | POA: Diagnosis not present

## 2022-05-03 DIAGNOSIS — Z79891 Long term (current) use of opiate analgesic: Secondary | ICD-10-CM | POA: Diagnosis not present

## 2022-05-03 DIAGNOSIS — Z4789 Encounter for other orthopedic aftercare: Secondary | ICD-10-CM | POA: Diagnosis not present

## 2022-05-03 DIAGNOSIS — M4156 Other secondary scoliosis, lumbar region: Secondary | ICD-10-CM | POA: Diagnosis not present

## 2022-05-03 DIAGNOSIS — M48062 Spinal stenosis, lumbar region with neurogenic claudication: Secondary | ICD-10-CM | POA: Diagnosis not present

## 2022-05-03 DIAGNOSIS — Z7902 Long term (current) use of antithrombotics/antiplatelets: Secondary | ICD-10-CM | POA: Diagnosis not present

## 2022-05-03 DIAGNOSIS — M4726 Other spondylosis with radiculopathy, lumbar region: Secondary | ICD-10-CM | POA: Diagnosis not present

## 2022-05-05 DIAGNOSIS — Z7902 Long term (current) use of antithrombotics/antiplatelets: Secondary | ICD-10-CM | POA: Diagnosis not present

## 2022-05-05 DIAGNOSIS — M4726 Other spondylosis with radiculopathy, lumbar region: Secondary | ICD-10-CM | POA: Diagnosis not present

## 2022-05-05 DIAGNOSIS — M48062 Spinal stenosis, lumbar region with neurogenic claudication: Secondary | ICD-10-CM | POA: Diagnosis not present

## 2022-05-05 DIAGNOSIS — Z79891 Long term (current) use of opiate analgesic: Secondary | ICD-10-CM | POA: Diagnosis not present

## 2022-05-05 DIAGNOSIS — M4156 Other secondary scoliosis, lumbar region: Secondary | ICD-10-CM | POA: Diagnosis not present

## 2022-05-05 DIAGNOSIS — Z4789 Encounter for other orthopedic aftercare: Secondary | ICD-10-CM | POA: Diagnosis not present

## 2022-05-09 DIAGNOSIS — Z7902 Long term (current) use of antithrombotics/antiplatelets: Secondary | ICD-10-CM | POA: Diagnosis not present

## 2022-05-09 DIAGNOSIS — M48062 Spinal stenosis, lumbar region with neurogenic claudication: Secondary | ICD-10-CM | POA: Diagnosis not present

## 2022-05-09 DIAGNOSIS — Z79891 Long term (current) use of opiate analgesic: Secondary | ICD-10-CM | POA: Diagnosis not present

## 2022-05-09 DIAGNOSIS — Z4789 Encounter for other orthopedic aftercare: Secondary | ICD-10-CM | POA: Diagnosis not present

## 2022-05-09 DIAGNOSIS — M4726 Other spondylosis with radiculopathy, lumbar region: Secondary | ICD-10-CM | POA: Diagnosis not present

## 2022-05-09 DIAGNOSIS — M4156 Other secondary scoliosis, lumbar region: Secondary | ICD-10-CM | POA: Diagnosis not present

## 2022-05-10 DIAGNOSIS — Z4789 Encounter for other orthopedic aftercare: Secondary | ICD-10-CM | POA: Diagnosis not present

## 2022-05-10 DIAGNOSIS — M48062 Spinal stenosis, lumbar region with neurogenic claudication: Secondary | ICD-10-CM | POA: Diagnosis not present

## 2022-05-15 DIAGNOSIS — I10 Essential (primary) hypertension: Secondary | ICD-10-CM | POA: Diagnosis not present

## 2022-05-15 DIAGNOSIS — E039 Hypothyroidism, unspecified: Secondary | ICD-10-CM | POA: Diagnosis not present

## 2022-05-15 DIAGNOSIS — Z0000 Encounter for general adult medical examination without abnormal findings: Secondary | ICD-10-CM | POA: Diagnosis not present

## 2022-05-17 DIAGNOSIS — Z0000 Encounter for general adult medical examination without abnormal findings: Secondary | ICD-10-CM | POA: Diagnosis not present

## 2022-05-18 DIAGNOSIS — M4156 Other secondary scoliosis, lumbar region: Secondary | ICD-10-CM | POA: Diagnosis not present

## 2022-05-18 DIAGNOSIS — Z79891 Long term (current) use of opiate analgesic: Secondary | ICD-10-CM | POA: Diagnosis not present

## 2022-05-18 DIAGNOSIS — M4726 Other spondylosis with radiculopathy, lumbar region: Secondary | ICD-10-CM | POA: Diagnosis not present

## 2022-05-18 DIAGNOSIS — M48062 Spinal stenosis, lumbar region with neurogenic claudication: Secondary | ICD-10-CM | POA: Diagnosis not present

## 2022-05-18 DIAGNOSIS — Z4789 Encounter for other orthopedic aftercare: Secondary | ICD-10-CM | POA: Diagnosis not present

## 2022-05-18 DIAGNOSIS — Z7902 Long term (current) use of antithrombotics/antiplatelets: Secondary | ICD-10-CM | POA: Diagnosis not present

## 2022-05-20 NOTE — Progress Notes (Signed)
Carelink Summary Report / Loop Recorder 

## 2022-05-23 ENCOUNTER — Ambulatory Visit (INDEPENDENT_AMBULATORY_CARE_PROVIDER_SITE_OTHER): Payer: Medicare Other

## 2022-05-23 DIAGNOSIS — I639 Cerebral infarction, unspecified: Secondary | ICD-10-CM | POA: Diagnosis not present

## 2022-05-23 LAB — CUP PACEART REMOTE DEVICE CHECK
Date Time Interrogation Session: 20230905115210
Implantable Pulse Generator Implant Date: 20210708

## 2022-05-25 DIAGNOSIS — Z7902 Long term (current) use of antithrombotics/antiplatelets: Secondary | ICD-10-CM | POA: Diagnosis not present

## 2022-05-25 DIAGNOSIS — M48062 Spinal stenosis, lumbar region with neurogenic claudication: Secondary | ICD-10-CM | POA: Diagnosis not present

## 2022-05-25 DIAGNOSIS — Z79891 Long term (current) use of opiate analgesic: Secondary | ICD-10-CM | POA: Diagnosis not present

## 2022-05-25 DIAGNOSIS — M4156 Other secondary scoliosis, lumbar region: Secondary | ICD-10-CM | POA: Diagnosis not present

## 2022-05-25 DIAGNOSIS — Z4789 Encounter for other orthopedic aftercare: Secondary | ICD-10-CM | POA: Diagnosis not present

## 2022-05-25 DIAGNOSIS — M4726 Other spondylosis with radiculopathy, lumbar region: Secondary | ICD-10-CM | POA: Diagnosis not present

## 2022-06-02 DIAGNOSIS — M48062 Spinal stenosis, lumbar region with neurogenic claudication: Secondary | ICD-10-CM | POA: Diagnosis not present

## 2022-06-05 ENCOUNTER — Telehealth: Payer: Self-pay | Admitting: *Deleted

## 2022-06-05 NOTE — Patient Outreach (Signed)
  Care Coordination   06/05/2022 Name: Colin [name] MRN: [id number] DOB: [birth year]   Care Coordination Outreach Attempts:  An unsuccessful telephone outreach was attempted today to offer the patient information about available care coordination services as a benefit of their health plan.   Follow Up Plan:  Additional outreach attempts will be made to offer the patient care coordination information and services.   Encounter Outcome:  No Answer  Care Coordination Interventions Activated:  No   Care Coordination Interventions:  No, not indicated    Jacqlyn Larsen [facility], [place] RN Care Coordinator [phone]

## 2022-06-12 DIAGNOSIS — Z23 Encounter for immunization: Secondary | ICD-10-CM | POA: Diagnosis not present

## 2022-06-12 NOTE — Progress Notes (Signed)
Carelink Summary Report / Loop Recorder 

## 2022-06-15 ENCOUNTER — Ambulatory Visit (HOSPITAL_COMMUNITY)
Admission: RE | Admit: 2022-06-15 | Discharge: 2022-06-15 | Disposition: A | Discharge status: Home / Self Care | Payer: Medicare Other | Source: Ambulatory Visit | Attending: Cardiology | Admitting: Cardiology

## 2022-06-15 DIAGNOSIS — I77810 Thoracic aortic ectasia: Secondary | ICD-10-CM | POA: Insufficient documentation

## 2022-06-15 DIAGNOSIS — K449 Diaphragmatic hernia without obstruction or gangrene: Secondary | ICD-10-CM | POA: Diagnosis not present

## 2022-06-15 MED ORDER — IOHEXOL 350 MG/ML SOLN
100.0000 mL | Freq: Once | INTRAVENOUS | Status: AC | PRN
  Administered 2022-06-15: 100 mL via INTRAVENOUS

## 2022-06-16 ENCOUNTER — Encounter: Payer: Self-pay | Admitting: *Deleted

## 2022-06-16 ENCOUNTER — Telehealth: Payer: Self-pay | Admitting: *Deleted

## 2022-06-16 NOTE — Patient Outreach (Signed)
  Care Coordination   Initial Visit Note   06/16/2022 Name: Colin [name] MRN: [id number] DOB: 26-Sep-1950  Colin [name] is a 71 y.o. year old male who sees Cory Munch, [place] for primary care. I spoke with  Colin [name] by phone today.  What matters to the patients health and wellness today?  "maintain and manage pain"    Goals Addressed               This Visit's Progress     COMPLETED: "maintain and manage pain" (pt-stated)        Care Coordination Interventions: Patient interviewed about adult health maintenance status including  importance of yearly Annual Wellness Visit, pt states he saw primary care provider in August and AWV is completed yearly. Provided education about importance of taking medications as prescribed Discussed importance of adherence to all scheduled medical appointments Reviewed with patient prescribed pharmacological and nonpharmacological pain relief strategies Assessed social determinant of health barriers Explained care coordination program, pt agreeable to today's outreach but declines any further outreach, states managing well at home, pt states he swims regularly and this helps with pain, pt states has spinal stenosis, sees orthopedist and spine specialist Pain assessment completed Reviewed importance of continuing swimming for exercise        SDOH assessments and interventions completed:  Yes  SDOH Interventions Today    Flowsheet Row Most Recent Value  SDOH Interventions   Food Insecurity Interventions Intervention Not Indicated  Transportation Interventions Intervention Not Indicated        Care Coordination Interventions Activated:  Yes  Care Coordination Interventions:  Yes, provided   Follow up plan: No further intervention required.   Encounter Outcome:  Pt. Visit Completed   Jacqlyn Larsen [facility], BSN [facility] RN Care Coordinator [phone]

## 2022-06-20 ENCOUNTER — Telehealth: Payer: Self-pay | Admitting: Pulmonary Disease

## 2022-06-21 ENCOUNTER — Other Ambulatory Visit: Payer: Self-pay | Admitting: Acute Care

## 2022-06-21 DIAGNOSIS — J849 Interstitial pulmonary disease, unspecified: Secondary | ICD-10-CM

## 2022-06-21 DIAGNOSIS — U071 COVID-19: Secondary | ICD-10-CM

## 2022-06-21 DIAGNOSIS — J069 Acute upper respiratory infection, unspecified: Secondary | ICD-10-CM

## 2022-06-21 LAB — CUP PACEART REMOTE DEVICE CHECK
Date Time Interrogation Session: 20231003105425
Implantable Pulse Generator Implant Date: 20210708

## 2022-06-21 MED ORDER — AMOXICILLIN-POT CLAVULANATE 875-125 MG PO TABS: 1.0000 | ORAL_TABLET | Freq: Two times a day (BID) | ORAL | 0 refills | Status: DC

## 2022-06-21 MED ORDER — PREDNISONE 10 MG PO TABS: ORAL_TABLET | ORAL | 0 refills | Status: DC

## 2022-06-21 NOTE — Telephone Encounter (Signed)
Called and spoke with pt. No openings with JE in 2-3 weeks. Scheduled pt OV with BW for follow up to reassess after meds. Nothing further needed.

## 2022-06-21 NOTE — Telephone Encounter (Signed)
As APP of the day I got a message that Mr. Senn had called the office with recent diagnosis of Covid 19. Symptoms began 5 days ago. Pt has runny nose and had fever Saturday. He has a cough and is coughing up discolored sputum. He describes chest congestion . Oxygen sats are 95% on Room Air. I discussed that we could offer him an anti-viral as he is on the cusp of days of symptoms, and antibiotic and short prednisone taper. He then told me he had been started on ivermectin by his PCP and that he had taken the first dose. I explained that we do not recommend the Ivermectin, and that he cannot take both an antiviral and the ivermectin as they are both metabolized by the liver, and there is no safety data. I did sent in Augmentin 875/125 x 7 days , asked him to take probiotic while on antibiotic, and send in a short prednisone taper. ( Prednisone taper; 10 mg tablets:  3 tabs x 2 days, 2 tabs x 2 days 1 tab x 2 days then stop. I am concerned about a post viral infection  with chest congestion and discolored sputum. and would prefer to be pro active. I have asked him to seek emergency care if he gets worse not better, and to monitor saturations.  I have sent in both prescriptions.  Triage, please schedule follow up in the office with patient in 2-3 weeks with [name] if possible.  Nothing further is needed . Thanks

## 2022-06-21 NOTE — Telephone Encounter (Signed)
Primary Pulmonologist: [place] office visit and with whom: 04/06/22 with JE What do we see them for (pulmonary problems): OSA/NSIP Last OV assessment/plan:  Assessment & Plan:    Discussion: 71 year old male with severe OSA and hx lung fibrosis secondary to COVID-19 who presents for follow-up. Compliant with BiPAP. No respiratory issues.   OSA Discussed the long term cardiovascular benefits of treating sleep apnea, including improved blood pressure control, reduction in MI and stroke risk as well as other potential benefits of treatment, such as improved glycemic control, facilitation of weight loss, improved energy during the day and improved sleep quality. --Compliance report reviewed and usage >70% --Counseled on sleep hygiene --Counseled on weight loss/maintenance of healthy weight --Counseled NOT to drive if/when sleepy --Advised patient to wear CPAP for at least 4 hours each night for greater than 70% of the time to avoid the machine being repossessed by insurance.   Post-inflammatory fibrosis secondary to COVID-19 S/p prolonged steroid taper. Residual fibrosis on CT. PFTs have normalized. No further oxygen requirements   Peri-operative Assessment of Pulmonary Risk for Non-Thoracic Surgery:   For Colin [name], risk of perioperative pulmonary complications is increased by:             [ X]Age greater than 65 years             '[ ]'$ COPD             '[ ]'$ Serum albumin <3.5             '[ ]'$ Smoking             [ X]Obstructive sleep apnea             '[ ]'$ NYHA Class II Pulmonary Hypertension   Respiratory complications generally occur in 1% of ASA Class I patients, 5% of ASA Class II and 10% of ASA Class III-IV patients These complications rarely result in mortality and include postoperative pneumonia, atelectasis, pulmonary embolism, ARDS and increased time requiring postoperative mechanical ventilation.   ARISCAT score: 1.6% risk of in-hospital post-op pulmonary complications  (composite including respiratory failure, respiratory infection, pleural effusion, atelectasis, pneumothorax, bronchospasm, aspiration pneumonitis)   Overall, I recommend proceeding with the surgery if the risk for respiratory complications are outweighed by the potential benefits. This will need to be discussed between the patient and surgeon.   To reduce risks of respiratory complications, I recommend: --Pre- and post-operative incentive spirometry performed frequently while awake --Inpatient use of currently prescribed positive-pressure for OSA whenever the patient is sleeping --Avoiding use of pancuronium during anesthesia. --OOB, encourage mobility post-op   I have discussed the risk factors and recommendations above with the patient.     Health Maintenance     Immunization History  Administered Date(s) Administered   DTaP 02/15/2010   Influenza Split 06/14/2009, 08/02/2010, 07/24/2012, 06/20/2013   Influenza, High Dose Seasonal PF 08/29/2018, 06/16/2019   PFIZER(Purple Top)SARS-COV-2 Vaccination 11/13/2019, 12/11/2019   Pneumococcal Conjugate-13 04/30/2018   Pneumococcal Polysaccharide-23 06/18/2019   Tdap 02/15/2010, 05/11/2020   Zoster, Live 12/04/2012    No orders of the defined types were placed in this encounter.   No orders of the defined types were placed in this encounter.   Return in about 6 months (around 10/07/2022).  Was appointment offered to patient (explain)?  Pt wants recommendations   Reason for call: Called and spoke with pt who states he tested positive for covid 10/3. Symptoms began 4 days ago.  Pt said symptoms are common cold symptoms including productive  cough, runny nose, congestion, having problems with smell and taste and also having body aches. Pt stated that he felt like he had a temp 3 days ago as he was feeling hot, but never actually took a temp as he said.   Pt states that he has been taking delsym cough medicine to see if that would help  with his cough and occasionally taking tylenol when needed.    Pt wants to know what we could recommend to help with his symptoms. Sarah, please advise.  No Known Allergies  Immunization History  Administered Date(s) Administered   DTaP 02/15/2010   Influenza Split 06/14/2009, 08/02/2010, 07/24/2012, 06/20/2013   Influenza, High Dose Seasonal PF 08/29/2018, 06/16/2019   PFIZER(Purple Top)SARS-COV-2 Vaccination 11/13/2019, 12/11/2019   Pneumococcal Conjugate-13 04/30/2018   Pneumococcal Polysaccharide-23 06/18/2019   Tdap 02/15/2010, 05/11/2020   Zoster, Live 12/04/2012

## 2022-06-26 ENCOUNTER — Ambulatory Visit (INDEPENDENT_AMBULATORY_CARE_PROVIDER_SITE_OTHER): Payer: Medicare Other

## 2022-06-26 DIAGNOSIS — I639 Cerebral infarction, unspecified: Secondary | ICD-10-CM

## 2022-07-05 ENCOUNTER — Encounter: Payer: Self-pay | Admitting: Primary Care

## 2022-07-05 ENCOUNTER — Ambulatory Visit (INDEPENDENT_AMBULATORY_CARE_PROVIDER_SITE_OTHER): Payer: Medicare Other | Admitting: Primary Care

## 2022-07-05 VITALS — BP 136/64 | HR 62 | Temp 97.9°F | Ht 73.0 in | Wt 249.0 lb

## 2022-07-05 DIAGNOSIS — G4733 Obstructive sleep apnea (adult) (pediatric): Secondary | ICD-10-CM | POA: Diagnosis not present

## 2022-07-05 DIAGNOSIS — J8410 Pulmonary fibrosis, unspecified: Secondary | ICD-10-CM | POA: Diagnosis not present

## 2022-07-05 DIAGNOSIS — U071 COVID-19: Secondary | ICD-10-CM | POA: Diagnosis not present

## 2022-07-05 NOTE — Assessment & Plan Note (Addendum)
-   DX with Covid 2-3 weeks ago. Completed course of Augmentin and prednisone. He has minimal to no residual respiratory symptoms. Exam today was benign, chronic rales at lung bases from post covid fibrosis. VSS.

## 2022-07-05 NOTE — Patient Instructions (Addendum)
Excellent compliance with BiPAP, you are still having some residual apneas.  We will adjust pressure settings. Recommend you try wedge pillow for sleep apnea to elevate head and help cut down on apneas  We do not need a chest x-ray today, you had a CTA in September that confirms known NSIP which correlate with rales heard on exam   Notify office of pressure setting is too strong or cough persists  Orders: Adjust BIPAP pressure 16/12   Follow-up: 3 months with [name] (recall for January is already in chart)

## 2022-07-05 NOTE — Assessment & Plan Note (Addendum)
-   Secondary to covid-19 in 2020. CTA in September 2023 showed stable pattern of postinflammatory fibrosis, no new lung findings

## 2022-07-05 NOTE — Progress Notes (Signed)
Carelink Summary Report / Loop Recorder 

## 2022-07-05 NOTE — Progress Notes (Signed)
$'@Patient'V$  ID: Colin [name], male    DOB: 1951/01/30, 71 y.o.   MRN: [id number]  Chief Complaint  Patient presents with   Follow-up    Referring provider: Ginger [name]  HPI: 71 year old male, never smoke. PMH significant for NSIP, COVID-19, nocturnal hypoxemia, recent cryptogenic stroke, hypertension, GERD and DDD. Patient of [name].  Hospitalized with COVID-19 pneumonia in September 2020. Parenchymal changes on CT. Autoimmune work-up negative. He was started on steroids in November 2020, initial improvement with some recurrent shortness of breath with tapper.   05/26/20 Split night sleep study- Severe obstructive sleep apnea occurred during the diagnostic portion of the study (AHI = 72.7/hour). An optimal PAP pressure was selected for this patient (16 cm of water).    Previous [facility] pulmonary encounter:  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  Synopsis 2013 - Seen by [facility] with Dr. [name] for [place]. Normal PFTs.  2020 - Hospitalized at [facility] from 8/30-9/8 for COVID-19 pneumonia. Remained oxygen dependent with PFTs showing restrictive defect and moderated DLCO reduction. Started on prolonged prednisone taper in November 2021 - Off steroids in July. PFTs normalized. Hospitalized for TIA in July. Diagnosed with OSA. 2022 - OSA not controlled on CPAP. Started on BiPAP in June.  04/06/22 Since our last visit he was scheduled for mask fitting however stated he was no longer having issues on appointment visit. He is compliant with his BiPAP with 100% usage and >97% wearing for >4 hours. He is planning for surgery for his lumbar stenosis and being seen today for pre-op clearance. Denies recent respiratory infection. Did have ear infection earlier this month with resolution. Denies shortness of  breath, cough or wheezing.  07/05/2022 - Interim hx  Patient present today for 3 month follow-up. Hx NSIP and OSA on BIPAP. He was recently diagnosed with covid. He was treated with Augmentin and prednisone taper.  Presents today for 2-3 week follow-up. He is feeling well today, basically back to baseline. Minimal cough and dyspnea with exertion. Denies shortness of breath, chest tightness or wheezing.   He is sleeping well at night. He does not like wearing BIPAP but he wears it. Sleep study in September 2021 showed severe OSA, AHI 72/hr. He is currently on BIPAP 14/10 but is having some residual apneas. He had a titration study in June 2022 that showed he required pressure 16/12. He mainly sleeps on his back.   Airview download 06/04/22-07/03/22 29/30 days; 23 days (77%) > 4 hours Average usage 6 hours 12 mins Pressure 14/10cm h20 AHI 10.7    No Known Allergies  Immunization History  Administered Date(s) Administered   DTaP 02/15/2010   Influenza Split 06/14/2009, 08/02/2010, 07/24/2012, 06/20/2013   Influenza, High Dose Seasonal PF 08/29/2018, 06/16/2019   Influenza-Unspecified 06/12/2022   PFIZER(Purple Top)SARS-COV-2 Vaccination 11/13/2019, 12/11/2019   Pneumococcal Conjugate-13 04/30/2018   Pneumococcal Polysaccharide-23 06/18/2019   Tdap 02/15/2010, 05/11/2020   Zoster, Live 12/04/2012    Past Medical History:  Diagnosis Date   Arthritis    Barrett's esophagus    Bowel trouble    Stool leakage since colonscopy 02/2016   Cancer ([place])    skin cancer   Carpal tunnel syndrome, bilateral    Depression    in teenage years    GERD (gastroesophageal reflux disease)  History of COVID-19    Hyperlipemia    Hypertension    Hypothyroidism    Interstitial lung disease ([place])    OSA treated with BiPAP 04/01/2021   Peripheral neuropathy    Pneumonia 04/2019   Stroke ([place]) 03/2020   cryptogenic   TIA (transient ischemic attack) 03/2020   Wears glasses     Tobacco  History: Social History   Tobacco Use  Smoking Status Never  Smokeless Tobacco Never   Counseling given: Not Answered   Outpatient Medications Prior to Visit  Medication Sig Dispense Refill   clopidogrel (PLAVIX) 75 MG tablet Take 75 mg by mouth daily.     levothyroxine (SYNTHROID) 75 MCG tablet Take 75 mcg by mouth daily.      losartan-hydrochlorothiazide (HYZAAR) 50-12.5 MG tablet Take 1 tablet by mouth daily.     Omega-3 Fatty Acids (FISH OIL) 1000 MG CAPS Take 1,000 mg by mouth daily.      pantoprazole (PROTONIX) 40 MG tablet Take 40 mg by mouth daily as needed for heartburn or indigestion.     rosuvastatin (CRESTOR) 20 MG tablet Take 1 tablet (20 mg total) by mouth daily. 90 tablet 1   sildenafil (VIAGRA) 25 MG tablet Take 25 mg by mouth daily as needed for erectile dysfunction.     amoxicillin-clavulanate (AUGMENTIN) 875-125 MG tablet Take 1 tablet by mouth 2 (two) times daily. 14 tablet 0   predniSONE (DELTASONE) 10 MG tablet Prednisone taper; 10 mg tablets:  3 tabs x 2 days, 2 tabs x 2 days 1 tab x 2 days then stop. 20 tablet 0   No facility-administered medications prior to visit.   Review of Systems  Review of Systems  Constitutional: Negative.   HENT: Negative.    Respiratory:  Positive for cough. Negative for chest tightness, shortness of breath and wheezing.   Cardiovascular: Negative.    Physical Exam  BP 136/64 (BP Location: Left Arm, Patient Position: Sitting, Cuff Size: Normal)   Pulse 62   Temp 97.9 F (36.6 C) (Oral)   Ht '6\' 1"'$  (1.854 m)   Wt 249 lb (112.9 kg)   SpO2 99%   BMI 32.85 kg/m  Physical Exam Constitutional:      Appearance: Normal appearance.  HENT:     Head: Normocephalic and atraumatic.     Mouth/Throat:     Mouth: Mucous membranes are moist.     Pharynx: Oropharynx is clear.  Cardiovascular:     Rate and Rhythm: Normal rate and regular rhythm.  Pulmonary:     Effort: Pulmonary effort is normal. No respiratory distress.     Breath  sounds: Normal breath sounds. No wheezing or rhonchi.     Comments: Fine rales bases L>R Musculoskeletal:        General: Normal range of motion.  Skin:    General: Skin is warm and dry.  Neurological:     General: No focal deficit present.     Mental Status: He is alert and oriented to person, place, and time. Mental status is at baseline.  Psychiatric:        Mood and Affect: Mood normal.        Behavior: Behavior normal.        Thought Content: Thought content normal.        Judgment: Judgment normal.      Lab Results:  CBC    Component Value Date/Time   WBC 5.4 08/04/2021 0844   RBC 4.40 08/04/2021 0844   HGB 13.3  08/04/2021 0844   HCT 41.5 08/04/2021 0844   PLT 214 08/04/2021 0844   MCV 94.3 08/04/2021 0844   MCH 30.2 08/04/2021 0844   MCHC 32.0 08/04/2021 0844   RDW 13.2 08/04/2021 0844   LYMPHSABS 1.6 03/23/2020 1617   MONOABS 0.6 03/23/2020 1617   EOSABS 0.3 03/23/2020 1617   BASOSABS 0.1 03/23/2020 1617    BMET    Component Value Date/Time   NA 139 08/04/2021 0844   K 4.4 08/04/2021 0844   CL 107 08/04/2021 0844   CO2 25 08/04/2021 0844   GLUCOSE 99 08/04/2021 0844   BUN 15 08/04/2021 0844   CREATININE 0.85 08/04/2021 0844   CALCIUM 9.0 08/04/2021 0844   GFRNONAA >60 08/04/2021 0844   GFRAA >60 03/25/2020 0309    BNP No results found for: "BNP"  ProBNP No results found for: "PROBNP"  Imaging: CUP PACEART REMOTE DEVICE CHECK  Result Date: 06/21/2022 ILR summary report received. Battery status OK. Normal device function. No new symptom, tachy, brady, or pause episodes. No new AF episodes. AF burden is 0% of the time.  Monthly summary reports and ROV/PRN Kathy Breach, RN, CCDS, [employer]  CT ANGIO CHEST AORTA W/CM & OR WO/CM  Result Date: 06/15/2022 CLINICAL DATA:  Dilated aortic root.  Follow-up exam. EXAM: CT ANGIOGRAPHY CHEST WITH CONTRAST TECHNIQUE: Multidetector CT imaging of the chest was performed using the standard protocol  during bolus administration of intravenous contrast. Multiplanar CT image reconstructions and MIPs were obtained to evaluate the vascular anatomy. RADIATION DOSE REDUCTION: This exam was performed according to the departmental dose-optimization program which includes automated exposure control, adjustment of the mA and/or kV according to patient size and/or use of iterative reconstruction technique. CONTRAST:  163m OMNIPAQUE IOHEXOL 350 MG/ML SOLN COMPARISON:  Chest CTs dated 10/12/2020, 03/01/2020 and 03/28/2010. FINDINGS: Cardiovascular: Diameter of the ascending aorta measures 3.7 cm, stable compared to a measurement at the same location on most recent chest CT of 10/12/2020, not significantly changed in appearance compared to the oldest CT of 03/28/2010. Thoracic aortic arch and descending thoracic aorta are normal in caliber. No dissection of the thoracic aorta. Mild atherosclerosis at the aortic arch. Coronary artery calcifications, particularly dense within the LEFT anterior descending coronary artery. No pericardial effusion. No central obstructing pulmonary embolism. Mediastinum/Nodes: No mass or enlarged lymph nodes within the mediastinum or perihilar regions. Hiatal hernia within the lower portion of the mediastinum, moderate in size. Esophagus is otherwise unremarkable. Trachea and central bronchi are unremarkable. Lungs/Pleura: Stable pattern of mid and lower lung peripheral septal thickening, architectural distortion and chronic subtle ground-glass opacities, described on earlier high-resolution chest CT for as compatible with postinflammatory fibrosis and a fibrotic nonspecific interstitial pneumonia (NSIP) pattern. No new lung findings. Upper Abdomen: No acute findings. Musculoskeletal: Degenerative spondylosis of the slightly scoliotic thoracolumbar spine, mild to moderate in degree. No acute-appearing osseous abnormality. Review of the MIP images confirms the above findings. IMPRESSION: 1.  Diameter of the ascending aorta measures 3.7 cm, stable compared to a measurement at the same location on most recent chest CT of 10/12/2020, and not significantly changed in appearance compared to the remote chest CT of 03/28/2010. 2. Coronary artery calcifications, particularly dense within the LEFT anterior descending coronary artery. 3. Hiatal hernia, moderate in size. 4. Stable chronic lung findings, described on earlier high-resolution chest CT as compatible with postinflammatory fibrosis and a fibrotic nonspecific interstitial pneumonia (NSIP) pattern. No new lung findings. 5. Aortic atherosclerosis. Aortic Atherosclerosis (ICD10-I70.0). Electronically Signed  By: Franki Cabot M.D.   On: 06/15/2022 11:59     Assessment & Plan:   OSA treated with BiPAP - Hx severe OSA, AHI 72/hr. Excellent compliance with BiPAP, still having some residual apneas.  We will adjust pressure settings from 14/10cm h20 to 16/12cm h20. Recommend he try wedge pillow for sleep apnea to elevate head and help cut down on apneas. FU in 3 months or sooner if needed.   COVID-19 virus infection - DX with Covid 2-3 weeks ago. Completed course of Augmentin and prednisone. He has minimal to no residual respiratory symptoms. Exam today was benign, chronic rales at lung bases from post covid fibrosis. VSS.   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 CTA in September 2023 showed stable pattern of postinflammatory fibrosis, no new lung findings    Martyn Ehrich, NP 07/05/2022

## 2022-07-05 NOTE — Assessment & Plan Note (Signed)
-   Hx severe OSA, AHI 72/hr. Excellent compliance with BiPAP, still having some residual apneas.  We will adjust pressure settings from 14/10cm h20 to 16/12cm h20. Recommend he try wedge pillow for sleep apnea to elevate head and help cut down on apneas. FU in 3 months or sooner if needed.

## 2022-07-26 DIAGNOSIS — M4326 Fusion of spine, lumbar region: Secondary | ICD-10-CM | POA: Diagnosis not present

## 2022-07-31 ENCOUNTER — Ambulatory Visit (INDEPENDENT_AMBULATORY_CARE_PROVIDER_SITE_OTHER): Payer: Medicare Other

## 2022-07-31 DIAGNOSIS — I639 Cerebral infarction, unspecified: Secondary | ICD-10-CM | POA: Diagnosis not present

## 2022-07-31 LAB — CUP PACEART REMOTE DEVICE CHECK
Date Time Interrogation Session: 20231111000450
Implantable Pulse Generator Implant Date: 20210708
Zone Setting Status: 755011
Zone Setting Status: 755011

## 2022-08-15 ENCOUNTER — Telehealth: Payer: Self-pay

## 2022-08-15 NOTE — Telephone Encounter (Signed)
Received the following alert from [employer] [facility]:  ILR alert for AT/AF daily burden >threshold. AF burden 0.2%, no episodes logged due to parameters >10 min. Trend shows recent episodes 8-10 minutes, no EGM's for review. See C. Compass for episode logged. No [place] on record. Routing for review. - JJB  Patient has hx of occasional "blips" of AF; however, did want to flag for Dr. Curt Bears due to appears from September to present there have been an increased frequency of short episodes. Patient is NOT on an [place], hx of cryptogenic stroke.  No EGMs to review.  Longest AF recorded 10 minutes. V rates are controlled   Patient is not having any symptoms other than some noted occasional SOB but he attributed that to his lung disease.

## 2022-08-16 NOTE — Telephone Encounter (Signed)
No EGM's available due to how parameters are stored (>10 minutes). Per [facility], episodes are 8-10 minutes in duration. Would you like the parameters adjusted to (episodes >= 6 minutes) Dr. Curt Bears?

## 2022-08-17 NOTE — Telephone Encounter (Signed)
AF parameter reprogrammed to episodes >=6 minutes in [facility].

## 2022-09-04 ENCOUNTER — Ambulatory Visit (INDEPENDENT_AMBULATORY_CARE_PROVIDER_SITE_OTHER): Payer: Medicare Other

## 2022-09-04 DIAGNOSIS — I639 Cerebral infarction, unspecified: Secondary | ICD-10-CM | POA: Diagnosis not present

## 2022-09-04 LAB — CUP PACEART REMOTE DEVICE CHECK
Date Time Interrogation Session: 20231217230927
Implantable Pulse Generator Implant Date: 20210708

## 2022-09-04 NOTE — Progress Notes (Signed)
Carelink Summary Report / Loop Recorder 

## 2022-09-20 ENCOUNTER — Telehealth: Payer: Self-pay

## 2022-09-20 NOTE — Telephone Encounter (Signed)
ILR alert for "AT/AF Daily Burden > Threshold", no episodes logged. Short episode on trends noted. Presenting rhythm AF with controlled rates vs SR with freq PAC's (baseline wavy). Hx of falsely triggered AF. Rhythm on [facility] website 09/19/22, SR with freq PAC's as seen in previous reports. Will route for further review of presenting rhythm as second opinion, poor baseline.   Reviewed with Dr. Curt Bears. Also, compared to current 1/3 transmission of NSR.  09/16/22 presenting appears slow AF, short in duration.  Will continue to monitor.  MW, RN.      09/20/22 Today's presenting:

## 2022-10-09 ENCOUNTER — Ambulatory Visit: Payer: Medicare Other | Attending: Cardiology

## 2022-10-09 DIAGNOSIS — I639 Cerebral infarction, unspecified: Secondary | ICD-10-CM

## 2022-10-09 NOTE — Progress Notes (Signed)
[facility] Summary Report / Loop Recorder

## 2022-10-10 LAB — CUP PACEART REMOTE DEVICE CHECK
Date Time Interrogation Session: 20240119230523
Implantable Pulse Generator Implant Date: 20210708

## 2022-10-24 ENCOUNTER — Encounter (HOSPITAL_BASED_OUTPATIENT_CLINIC_OR_DEPARTMENT_OTHER): Payer: Self-pay | Admitting: Pulmonary Disease

## 2022-10-24 NOTE — Telephone Encounter (Signed)
[name], pt is questioning if his DL looks ok. Pt is in [place] and able to be printed.

## 2022-10-24 NOTE — Telephone Encounter (Signed)
Please print CPAP download for me to review (3 months)

## 2022-10-26 DIAGNOSIS — M48062 Spinal stenosis, lumbar region with neurogenic claudication: Secondary | ICD-10-CM | POA: Diagnosis not present

## 2022-10-26 DIAGNOSIS — M4326 Fusion of spine, lumbar region: Secondary | ICD-10-CM | POA: Diagnosis not present

## 2022-10-26 NOTE — Telephone Encounter (Signed)
I called patient and left message on voicemail.  CPAP compliance great. AHI 8.6 on BiPAP 16/12. Could consider adjusting settings but will need follow-up appointment in clinic for evaluation. He is due for follow-up anyway.  Please contact to schedule with me when next available

## 2022-11-10 LAB — CUP PACEART REMOTE DEVICE CHECK
Date Time Interrogation Session: 20240221231300
Implantable Pulse Generator Implant Date: 20210708

## 2022-11-13 ENCOUNTER — Ambulatory Visit: Payer: Medicare Other

## 2022-11-13 DIAGNOSIS — I639 Cerebral infarction, unspecified: Secondary | ICD-10-CM | POA: Diagnosis not present

## 2022-11-15 ENCOUNTER — Ambulatory Visit (INDEPENDENT_AMBULATORY_CARE_PROVIDER_SITE_OTHER): Payer: Medicare Other | Admitting: Pulmonary Disease

## 2022-11-15 ENCOUNTER — Encounter (HOSPITAL_BASED_OUTPATIENT_CLINIC_OR_DEPARTMENT_OTHER): Payer: Self-pay | Admitting: Pulmonary Disease

## 2022-11-15 VITALS — BP 112/70 | HR 54 | Ht 73.0 in | Wt 254.0 lb

## 2022-11-15 DIAGNOSIS — R0602 Shortness of breath: Secondary | ICD-10-CM | POA: Diagnosis not present

## 2022-11-15 DIAGNOSIS — G4733 Obstructive sleep apnea (adult) (pediatric): Secondary | ICD-10-CM | POA: Diagnosis not present

## 2022-11-15 NOTE — Patient Instructions (Addendum)
Wear BiPAP during the entirety of sleep when you can Continue regular aerobic activity Consider Inspire Sleep Device Discussed weight loss for goal <242 lbs, BMI 32  If you continue to have shortness of breath despite wearing your BiPAP longer, please contact our office to order repeat pulmonary function tests  Follow-up with me in 4 months

## 2022-11-15 NOTE — Progress Notes (Addendum)
$'@Patient'H$  ID: Colin [name], male    DOB: August 19, 1951, 72 y.o.   MRN: [id number]  Chief Complaint  Patient presents with   Follow-up    sob    Referring provider: Ginger Organ  HPI: 72 year old male, never smoke. PMH significant for NSIP, COVID-19, nocturnal hypoxemia, recent cryptogenic stroke, hypertension, GERD and DDD. Patient of [name].  Hospitalized with COVID-19 pneumonia in September 2020. Parenchymal changes on CT. Autoimmune work-up negative. He was started on steroids in November 2020, initial improvement with some recurrent shortness of breath with tapper.   05/26/20 Split night sleep study- Severe obstructive sleep apnea occurred during the diagnostic portion of the study (AHI = 72.7/hour). An optimal PAP pressure was selected for this patient (16 cm of water).   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  Synopsis 2013 - Seen by [facility] with Dr. [name] for [place]. Normal PFTs.  2020 - Hospitalized at [facility] from 8/30-9/8 for COVID-19 pneumonia. Remained oxygen dependent with PFTs showing restrictive defect and moderated DLCO reduction. Started on prolonged prednisone taper in November 2021 - Off steroids in July. PFTs normalized. Hospitalized for TIA in July. Diagnosed with OSA. 2022 - OSA not controlled on CPAP. Started on BiPAP in June. 2023 - Covid infection.  On BIPAP 14/10. Titration study in June 2022 that showed he required pressure 16/12.   11/15/2022 - Interim hx  On his last visit his BiPAP settings were changed 14/10>16/12 cm H20. He continues to have shortness of breath. In the last month, he swims in an aerobic class three times a week. Reports perceived shortness of breath after talking. After reading a chapter at church, does not need to pause or  gasping but notices slowing down on recording. He is compliant with his CPAP but does stop wearing it have 6 hours and continues to sleep for an additional 2-3 hours.  Airview download 10/15/22-11/13/22 30/30 days; 30 days (100%) > 4 hours Average usage 6 hours 18 mins Pressure 16/12 cm h20 AHI 7.9  No Known Allergies  Immunization History  Administered Date(s) Administered   DTaP 02/15/2010   Influenza Split 06/14/2009, 08/02/2010, 07/24/2012, 06/20/2013   Influenza, High Dose Seasonal PF 08/29/2018, 06/16/2019   Influenza-Unspecified 06/12/2022   PFIZER(Purple Top)SARS-COV-2 Vaccination 11/13/2019, 12/11/2019   Pneumococcal Conjugate-13 04/30/2018   Pneumococcal Polysaccharide-23 06/18/2019   Tdap 02/15/2010, 05/11/2020   Zoster, Live 12/04/2012    Past Medical History:  Diagnosis Date   Arthritis    Barrett's esophagus    Bowel trouble    Stool leakage since colonscopy 02/2016   Cancer ([place])    skin cancer   Carpal tunnel syndrome, bilateral    Depression    in teenage years    GERD (gastroesophageal reflux disease)    History of COVID-19    Hyperlipemia    Hypertension    Hypothyroidism    Interstitial lung disease ([place])    OSA treated with BiPAP 04/01/2021   Peripheral neuropathy    Pneumonia 04/2019   Stroke ([place]) 03/2020   cryptogenic   TIA (transient ischemic attack) 03/2020   Wears glasses     Tobacco History: Social History   Tobacco Use  Smoking Status Never  Smokeless Tobacco Never   Counseling given: Not Answered   Outpatient Medications Prior to Visit  Medication Sig Dispense Refill   clopidogrel (PLAVIX) 75 MG tablet Take 75 mg by mouth daily.     levothyroxine (SYNTHROID) 75 MCG tablet Take 75 mcg by mouth daily.      losartan-hydrochlorothiazide (HYZAAR) 50-12.5 MG tablet Take 1 tablet by mouth daily.     Omega-3 Fatty Acids (FISH OIL) 1000 MG CAPS Take 1,000 mg by mouth daily.      pantoprazole (PROTONIX) 40 MG tablet Take 40 mg by  mouth daily as needed for heartburn or indigestion.     rosuvastatin (CRESTOR) 20 MG tablet Take 1 tablet (20 mg total) by mouth daily. 90 tablet 1   sildenafil (VIAGRA) 25 MG tablet Take 25 mg by mouth daily as needed for erectile dysfunction.     amoxicillin-clavulanate (AUGMENTIN) 875-125 MG tablet Take 1 tablet by mouth 2 (two) times daily. 14 tablet 0   predniSONE (DELTASONE) 10 MG tablet Prednisone taper; 10 mg tablets:  3 tabs x 2 days, 2 tabs x 2 days 1 tab x 2 days then stop. 20 tablet 0   No facility-administered medications prior to visit.   Review of Systems  Review of Systems  Constitutional:  Negative for chills, diaphoresis and fever.  HENT:  Negative for congestion.   Respiratory:  Positive for shortness of breath. Negative for cough and wheezing.   Cardiovascular:  Negative for chest pain, palpitations and leg swelling.   Physical Exam  BP 112/70 (BP Location: Right Arm, Cuff Size: Normal)   Pulse (!) 54   Ht '6\' 1"'$  (1.854 m)   Wt 254 lb (115.2 kg)   SpO2 98%   BMI 33.51 kg/m  Physical Exam Vitals reviewed.  Constitutional:      General: He is not in acute distress.    Appearance: He is well-developed. He is not diaphoretic.  HENT:     Head: Normocephalic and atraumatic.     Nose: Nose normal.     Mouth/Throat:     Pharynx: No oropharyngeal exudate.  Eyes:     General: No scleral icterus.    Conjunctiva/sclera: Conjunctivae normal.  Neck:     Vascular: No JVD.     Trachea: No tracheal deviation.  Cardiovascular:     Rate and Rhythm: Normal rate and regular rhythm.     Heart sounds: Normal heart sounds. No murmur heard.    No friction rub. No gallop.  Pulmonary:     Effort: Pulmonary effort is normal. No respiratory distress.     Breath sounds: No wheezing or rales.  Abdominal:     General: Bowel sounds are normal. There is no distension.     Palpations: Abdomen is soft.     Tenderness: There is no abdominal tenderness.  Musculoskeletal:         General: Normal range of motion.     Cervical back: Normal range of motion and neck supple.  Lymphadenopathy:     Cervical: No cervical adenopathy.  Skin:    General: Skin is warm and dry.     Findings: No erythema or rash.  Neurological:     Mental Status: He is alert and oriented to person, place, and time.     Cranial Nerves: No cranial nerve deficit.  Psychiatric:        Behavior: Behavior normal.        Thought Content: Thought content normal.      Lab Results:  CBC    Component Value Date/Time   WBC 5.4 08/04/2021 0844   RBC  4.40 08/04/2021 0844   HGB 13.3 08/04/2021 0844   HCT 41.5 08/04/2021 0844   PLT 214 08/04/2021 0844   MCV 94.3 08/04/2021 0844   MCH 30.2 08/04/2021 0844   MCHC 32.0 08/04/2021 0844   RDW 13.2 08/04/2021 0844   LYMPHSABS 1.6 03/23/2020 1617   MONOABS 0.6 03/23/2020 1617   EOSABS 0.3 03/23/2020 1617   BASOSABS 0.1 03/23/2020 1617    BMET    Component Value Date/Time   NA 139 08/04/2021 0844   K 4.4 08/04/2021 0844   CL 107 08/04/2021 0844   CO2 25 08/04/2021 0844   GLUCOSE 99 08/04/2021 0844   BUN 15 08/04/2021 0844   CREATININE 0.85 08/04/2021 0844   CALCIUM 9.0 08/04/2021 0844   GFRNONAA >60 08/04/2021 0844   GFRAA >60 03/25/2020 0309    CPAP compliance 10/15/22-11/13/22 Usage days 30/30 days 100% >4hours 30 days 100% AHI 7.9 BiPAP 16/12 cm H20 Assessment & Plan:   OSA The natural history, progression and prognosis of sleep apnea, treatment with PAP and alternative treatment strategies were discussed. The patient was also educated regarding the long term cardiovascular benefits of treating sleep apnea, including improved blood pressure control, reduction in MI and stroke risk as well as other potential benefits of treatment, such as improved glycemic control, facilitation of weight loss, improved energy during the day and improved sleep quality. Patient uses NIV for more than four hours nightly for at least 70% of nights during  the last three months of usage. The patient has been using and benefiting from PAP use and will continue to benefit from therapy.  --Counseled on sleep hygiene --Counseled on weight loss/maintenance of healthy weight --Counseled NOT to drive if/when sleepy --Advised patient to wear CPAP for at least 4 hours each night for greater than 70% of the time to avoid the machine being repossessed by insurance.  Shortness of breath - likely related to uncontrolled sleep apnea. Last Hg 12.8. 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 Residual fibrosis on CT. PFTs have normalized. No further oxygen requirements  I have spent a total time of 35 -minutes on the day of the appointment including chart review, data review, collecting history, coordinating care and discussing medical diagnosis and plan with the patient/family. Past medical history, allergies, medications were reviewed. Pertinent imaging, labs and tests included in this note have been reviewed and interpreted independently by me.  Rodman Pickle, M.D. [facility] Pulmonary/Critical Care Medicine 11/15/2022 9:12 AM

## 2022-11-23 NOTE — Progress Notes (Signed)
Carelink Summary Report / Loop Recorder 

## 2022-12-18 ENCOUNTER — Ambulatory Visit (INDEPENDENT_AMBULATORY_CARE_PROVIDER_SITE_OTHER): Payer: Medicare Other

## 2022-12-18 DIAGNOSIS — I639 Cerebral infarction, unspecified: Secondary | ICD-10-CM | POA: Diagnosis not present

## 2022-12-18 LAB — CUP PACEART REMOTE DEVICE CHECK
Date Time Interrogation Session: 20240331231748
Implantable Pulse Generator Implant Date: 20210708

## 2022-12-22 NOTE — Progress Notes (Signed)
Carelink Summary Report / Loop Recorder 

## 2022-12-27 DIAGNOSIS — M170 Bilateral primary osteoarthritis of knee: Secondary | ICD-10-CM | POA: Diagnosis not present

## 2023-01-04 DIAGNOSIS — M170 Bilateral primary osteoarthritis of knee: Secondary | ICD-10-CM | POA: Diagnosis not present

## 2023-01-11 DIAGNOSIS — M170 Bilateral primary osteoarthritis of knee: Secondary | ICD-10-CM | POA: Diagnosis not present

## 2023-01-22 ENCOUNTER — Ambulatory Visit (INDEPENDENT_AMBULATORY_CARE_PROVIDER_SITE_OTHER): Payer: Medicare Other

## 2023-01-22 DIAGNOSIS — I639 Cerebral infarction, unspecified: Secondary | ICD-10-CM | POA: Diagnosis not present

## 2023-01-22 LAB — CUP PACEART REMOTE DEVICE CHECK
Date Time Interrogation Session: 20240505230738
Implantable Pulse Generator Implant Date: 20210708

## 2023-01-23 NOTE — Progress Notes (Signed)
Carelink Summary Report / Loop Recorder 

## 2023-02-19 NOTE — Progress Notes (Signed)
Carelink Summary Report / Loop Recorder 

## 2023-02-26 ENCOUNTER — Ambulatory Visit (INDEPENDENT_AMBULATORY_CARE_PROVIDER_SITE_OTHER): Payer: Medicare Other

## 2023-02-26 DIAGNOSIS — I639 Cerebral infarction, unspecified: Secondary | ICD-10-CM

## 2023-02-26 LAB — CUP PACEART REMOTE DEVICE CHECK
Date Time Interrogation Session: 20240609230528
Implantable Pulse Generator Implant Date: 20210708

## 2023-03-19 NOTE — Progress Notes (Signed)
Carelink Summary Report / Loop Recorder 

## 2023-03-30 ENCOUNTER — Ambulatory Visit (INDEPENDENT_AMBULATORY_CARE_PROVIDER_SITE_OTHER): Payer: Medicare Other

## 2023-03-30 DIAGNOSIS — I639 Cerebral infarction, unspecified: Secondary | ICD-10-CM | POA: Diagnosis not present

## 2023-04-02 LAB — CUP PACEART REMOTE DEVICE CHECK
Date Time Interrogation Session: 20240712230517
Implantable Pulse Generator Implant Date: 20210708

## 2023-04-16 NOTE — Progress Notes (Signed)
Carelink Summary Report / Loop Recorder 

## 2023-05-02 ENCOUNTER — Ambulatory Visit (INDEPENDENT_AMBULATORY_CARE_PROVIDER_SITE_OTHER): Payer: Medicare Other

## 2023-05-02 DIAGNOSIS — I639 Cerebral infarction, unspecified: Secondary | ICD-10-CM

## 2023-05-03 LAB — CUP PACEART REMOTE DEVICE CHECK
Date Time Interrogation Session: 20240814230440
Implantable Pulse Generator Implant Date: 20210708

## 2023-05-15 ENCOUNTER — Encounter (HOSPITAL_BASED_OUTPATIENT_CLINIC_OR_DEPARTMENT_OTHER): Payer: Self-pay | Admitting: Pulmonary Disease

## 2023-05-15 ENCOUNTER — Ambulatory Visit (HOSPITAL_BASED_OUTPATIENT_CLINIC_OR_DEPARTMENT_OTHER): Payer: Medicare Other | Admitting: Pulmonary Disease

## 2023-05-15 VITALS — BP 108/70 | HR 62 | Resp 16 | Ht 73.0 in | Wt 249.6 lb

## 2023-05-15 DIAGNOSIS — G4733 Obstructive sleep apnea (adult) (pediatric): Secondary | ICD-10-CM

## 2023-05-15 NOTE — Progress Notes (Signed)
@Patient  ID: Colin [name], male    DOB: 04/25/1951, 72 y.o.   MRN: [id number]  Chief Complaint  Patient presents with   Follow-up    OSA on Bipap. Patient states that he has lost 20 pounds. He wants to get the aspire when he can reach the weight loss goal.     Referring provider: Samuella [name]  HPI: 72 year old male, never smoke. PMH significant for NSIP, COVID-19, nocturnal hypoxemia, recent cryptogenic stroke, hypertension, GERD and DDD  Hospitalized with COVID-19 pneumonia in September 2020. Parenchymal changes on CT. Autoimmune work-up negative. He was started on steroids in November 2020, initial improvement with some recurrent shortness of breath with tapper.   05/26/20 Split night sleep study- Severe obstructive sleep apnea occurred during the diagnostic portion of the study (AHI = 72.7/hour). An optimal PAP pressure was selected for this patient (16 cm of water).   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  Synopsis 2013 - Seen by [facility] with Dr. Shelle Iron for DOE. Normal PFTs.  2020 - Hospitalized at [facility] from 8/30-9/8 for COVID-19 pneumonia. Remained oxygen dependent with PFTs showing restrictive defect and moderated DLCO reduction. Started on prolonged prednisone taper in November 2021 - Off steroids in July. PFTs normalized. Hospitalized for TIA in July. Diagnosed with OSA. 2022 - OSA not controlled on CPAP. Started on BiPAP in June. 2023 - Covid infection.  On BIPAP 14/10. Titration study in June 2022 that showed he required pressure 16/12 2024 - Previously changed BiPAP to 16/12 cm H20 with some improvement in AHI  05/15/23 Since our last visit he has lost some weight 5-10 lbs. He does not like wearing his BiPAP but is compliant nightly for 6 hours nightly. He  reports his shortness of breath occurs but swims three times a week so does not feel like this limits his activities. He is interested in the [place] device.   Review of Systems  Review of Systems  Constitutional:  Negative for chills, diaphoresis and fever.  HENT:  Negative for congestion.   Respiratory:  Negative for cough, shortness of breath and wheezing.   Cardiovascular:  Negative for chest pain, palpitations and leg swelling.   Physical Exam  BP 108/70   Pulse 62   Resp 16   Ht 6\' 1"  (1.854 m)   Wt 249 lb 9.6 oz (113.2 kg)   SpO2 96%   BMI 32.93 kg/m    Physical Exam: General: Well-appearing, no acute distress HENT: [place], AT Eyes: EOMI, no scleral icterus Respiratory: Clear to auscultation bilaterally.  No crackles, wheezing or rales Cardiovascular: RRR, -M/R/G, no JVD Extremities:-Edema,-tenderness Neuro: AAO x4, CNII-XII grossly intact Psych: Normal mood, normal affect  Lab Results:  CBC    Component Value Date/Time   WBC 5.4 08/04/2021 0844   RBC 4.40 08/04/2021 0844   HGB 13.3 08/04/2021 0844   HCT 41.5 08/04/2021 0844   PLT 214 08/04/2021 0844   MCV 94.3 08/04/2021 0844   MCH 30.2 08/04/2021 0844   MCHC 32.0 08/04/2021 0844   RDW 13.2 08/04/2021 0844   LYMPHSABS 1.6 03/23/2020 1617   MONOABS 0.6 03/23/2020 1617   EOSABS 0.3 03/23/2020 1617   BASOSABS 0.1 03/23/2020 1617     Airview download 10/15/22-11/13/22 30/30 days; 30 days (100%) > 4 hours Average usage 6  hours 18 mins Pressure 16/12 cm h20 AHI 7.9  CPAP 02/14/23-05/14/23 Usage days 90/90 days (100%) >4 hours 81 days (90%) IPAP/EPAP 16/12 cm H20 AHI 10.2  Assessment & Plan:   OSA - fairly controlled The natural history, progression and prognosis of sleep apnea, treatment with PAP and alternative treatment strategies were discussed. The patient was also educated regarding the long term cardiovascular benefits of treating sleep apnea, including improved blood pressure control, reduction in  MI and stroke risk as well as other potential benefits of treatment, such as improved glycemic control, facilitation of weight loss, improved energy during the day and improved sleep quality. --Counseled on sleep hygiene --Counseled on weight loss/maintenance of healthy weight --Counseled NOT to drive if/when sleepy --Patient uses NIV for more than four hours nightly for at least 70% of nights during the last three months of usage. The patient has been using and benefiting from PAP use and will continue to benefit from therapy.   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 Residual fibrosis on CT. PFTs have normalized. No further oxygen requirements  I have spent a total time of 35-minutes on the day of the appointment including chart review, data review, collecting history, coordinating care and discussing medical diagnosis and plan with the patient/family. Past medical history, allergies, medications were reviewed. Pertinent imaging, labs and tests included in this note have been reviewed and interpreted independently by me.  I have spent a total time of 30-minutes on the day of the appointment including chart review, data review, collecting history, coordinating care and discussing medical diagnosis and plan with the patient/family. Past medical history, allergies, medications were reviewed. Pertinent imaging, labs and tests included in this note have been reviewed and interpreted independently by me.  Colin Collin, MD [facility] Pulmonary/Critical Care Medicine 05/15/2023 4:03 PM

## 2023-05-15 NOTE — Patient Instructions (Addendum)
OSA --Counseled on sleep hygiene --Counseled on weight loss/maintenance of healthy weight --Counseled NOT to drive if/when sleepy --Advised patient to wear CPAP for at least 4 hours each night for greater than 70% of the time to avoid the machine being repossessed by insurance.  Shortness of breath - likely related to uncontrolled sleep apnea. Last Hg 12.8. Good exercise tolerance Wear BiPAP during the entirety of sleep when you can Continue regular aerobic activity Consider Inspire Sleep Device Discussed weight loss for goal <242 lbs, BMI 32   Work on weight loss!

## 2023-05-16 NOTE — Progress Notes (Signed)
Carelink Summary Report / Loop Recorder 

## 2023-05-18 DIAGNOSIS — Z125 Encounter for screening for malignant neoplasm of prostate: Secondary | ICD-10-CM | POA: Diagnosis not present

## 2023-05-18 DIAGNOSIS — E785 Hyperlipidemia, unspecified: Secondary | ICD-10-CM | POA: Diagnosis not present

## 2023-05-18 DIAGNOSIS — I10 Essential (primary) hypertension: Secondary | ICD-10-CM | POA: Diagnosis not present

## 2023-05-18 DIAGNOSIS — E039 Hypothyroidism, unspecified: Secondary | ICD-10-CM | POA: Diagnosis not present

## 2023-05-18 DIAGNOSIS — Z0000 Encounter for general adult medical examination without abnormal findings: Secondary | ICD-10-CM | POA: Diagnosis not present

## 2023-06-04 ENCOUNTER — Ambulatory Visit (INDEPENDENT_AMBULATORY_CARE_PROVIDER_SITE_OTHER): Payer: Medicare Other

## 2023-06-04 DIAGNOSIS — I639 Cerebral infarction, unspecified: Secondary | ICD-10-CM | POA: Diagnosis not present

## 2023-06-05 LAB — CUP PACEART REMOTE DEVICE CHECK
Date Time Interrogation Session: 20240916230756
Implantable Pulse Generator Implant Date: 20210708

## 2023-06-06 DIAGNOSIS — H524 Presbyopia: Secondary | ICD-10-CM | POA: Diagnosis not present

## 2023-06-06 DIAGNOSIS — H2513 Age-related nuclear cataract, bilateral: Secondary | ICD-10-CM | POA: Diagnosis not present

## 2023-06-08 ENCOUNTER — Encounter: Payer: Self-pay | Admitting: Orthopedic Surgery

## 2023-06-08 ENCOUNTER — Other Ambulatory Visit (INDEPENDENT_AMBULATORY_CARE_PROVIDER_SITE_OTHER): Payer: Self-pay

## 2023-06-08 ENCOUNTER — Other Ambulatory Visit (INDEPENDENT_AMBULATORY_CARE_PROVIDER_SITE_OTHER): Payer: Medicare Other

## 2023-06-08 ENCOUNTER — Ambulatory Visit (INDEPENDENT_AMBULATORY_CARE_PROVIDER_SITE_OTHER): Payer: Medicare Other | Admitting: Orthopedic Surgery

## 2023-06-08 DIAGNOSIS — M25561 Pain in right knee: Secondary | ICD-10-CM

## 2023-06-08 DIAGNOSIS — M25562 Pain in left knee: Secondary | ICD-10-CM

## 2023-06-08 DIAGNOSIS — M659 Synovitis and tenosynovitis, unspecified: Secondary | ICD-10-CM | POA: Diagnosis not present

## 2023-06-08 MED ORDER — METHYLPREDNISOLONE ACETATE 40 MG/ML IJ SUSP
40.0000 mg | INTRAMUSCULAR | Status: AC | PRN
Start: 2023-06-08 — End: 2023-06-08
  Administered 2023-06-08: 40 mg via INTRA_ARTICULAR

## 2023-06-08 MED ORDER — BUPIVACAINE HCL 0.25 % IJ SOLN
4.0000 mL | INTRAMUSCULAR | Status: AC | PRN
Start: 2023-06-08 — End: 2023-06-08
  Administered 2023-06-08: 4 mL via INTRA_ARTICULAR

## 2023-06-08 MED ORDER — LIDOCAINE HCL 1 % IJ SOLN
5.0000 mL | INTRAMUSCULAR | Status: AC | PRN
Start: 2023-06-08 — End: 2023-06-08
  Administered 2023-06-08: 5 mL

## 2023-06-08 NOTE — Progress Notes (Signed)
Office Visit Note   Patient: Colin [name]           Date of Birth: 1950/10/30           MRN: [id number] Visit Date: 06/08/2023 Requested by: Shawnie Dapper, PA-C [address] [place],  [place] [address] PCP: [name]  Subjective: Chief Complaint  Patient presents with   Other    Bilateral knee pain.  Patient states left is worse than right. States knee pain is chronic and had been treated by someone else approximately 5 years ago.      HPI: Colin [name] is a 72 y.o. male who presents to the office reporting left knee pain.  Patient describes no history of injury to the left knee.  Does report having had Hyalgan injections in the knee in the past which have helped some.  The left knee pain is increased significantly over the past few weeks.  He believes that it is getting to the point where it is making him a fall risk.  The pain is bothered him for years.  Does take ibuprofen and Tylenol.  The pain hurts him at night primarily on the medial side of the knee but also globally.  Denies any groin pain on that left-hand side.  He recently did have a spinal decompression procedure on his back for both scoliosis and stenosis.  Twisting hurts his knee.  Does hurt him to get in and out of a chair.  Has had multiple hip surgeries with the last surgery being in 2016.  Denies any groin pain.  Denies any numbness and tingling in that left leg..                ROS: All systems reviewed are negative as they relate to the chief complaint within the history of present illness.  Patient denies fevers or chills.  Assessment & Plan: Visit Diagnoses:  1. Pain in both knees, unspecified chronicity   2. Left knee pain, unspecified chronicity     Plan: Impression is left knee pain with good range of motion the knee and fairly underwhelming radiographs and exam.  No effusion is present.  I think he could have some intrinsic pathology in the knee such as a meniscal tear or stress  reaction/fracture.  Alternatively this could be referred pain from the hip and/or back.  Plan at this time is diagnostic and therapeutic knee injection along with MRI scanning of the knee to rule out a structural problem.  He will follow-up after that and we can decide whether to pursue further workup of potential referring pain sources.  Follow-Up Instructions: No follow-ups on file.   Orders:  Orders Placed This Encounter  Procedures   XR Knee 1-2 Views Right   XR KNEE 3 VIEW LEFT   MR Knee Left w/o contrast   No orders of the defined types were placed in this encounter.     Procedures: Large Joint Inj: L knee on 06/08/2023 6:21 PM Indications: diagnostic evaluation, joint swelling and pain Details: 18 G 1.5 in needle, superolateral approach  Arthrogram: No  Medications: 5 mL lidocaine 1 %; 40 mg methylPREDNISolone acetate 40 MG/ML; 4 mL bupivacaine 0.25 % Outcome: tolerated well, no immediate complications Procedure, treatment alternatives, risks and benefits explained, specific risks discussed. Consent was given by the patient. Immediately prior to procedure a time out was called to verify the correct patient, procedure, equipment, support staff and site/side marked as required. Patient was prepped  and draped in the usual sterile fashion.       Clinical Data: No additional findings.  Objective: Vital Signs: There were no vitals taken for this visit.  Physical Exam:  Constitutional: Patient appears well-developed HEENT:  Head: Normocephalic Eyes:EOM are normal Neck: Normal range of motion Cardiovascular: Normal rate Pulmonary/chest: Effort normal Neurologic: Patient is alert Skin: Skin is warm Psychiatric: Patient has normal mood and affect  Ortho Exam: Ortho exam demonstrates gait consistent with scoliosis as well as multiple hip surgeries.  Does have slightly unequal leg lengths.  Left knee is examined has no effusion excellent range of motion with stable  collateral cruciate ligaments.  Medial greater than lateral joint line tenderness and mild patellofemoral crepitus.  Pedal pulses intact.  No groin pain with internal or external rotation of either hip.  Specialty Comments:  No specialty comments available.  Imaging: No results found.   PMFS History: Patient Active Problem List   Diagnosis Date Noted   COVID-19 virus infection 07/05/2022   OSA treated with BiPAP 04/01/2021   Cryptogenic stroke (HCC) 03/23/2020   Swelling of lower extremity 03/15/2020   Bilateral leg paresthesia 03/15/2020   Right rotator cuff tear 02/12/2020   Postinflammatory pulmonary fibrosis (HCC) 07/31/2019   History of 2019 novel coronavirus disease (COVID-19) 07/27/2019   Hypothyroidism 05/19/2019   GERD (gastroesophageal reflux disease) 05/19/2019   Hyperlipidemia 05/19/2019   OA (osteoarthritis) of hip 04/05/2016   Hypertension    Past Medical History:  Diagnosis Date   Arthritis    Barrett's esophagus    Bowel trouble    Stool leakage since colonscopy 02/2016   Cancer (HCC)    skin cancer   Carpal tunnel syndrome, bilateral    Depression    in teenage years    GERD (gastroesophageal reflux disease)    History of COVID-19    Hyperlipemia    Hypertension    Hypothyroidism    Interstitial lung disease (HCC)    OSA treated with BiPAP 04/01/2021   Peripheral neuropathy    Pneumonia 04/2019   Stroke (HCC) 03/2020   cryptogenic   TIA (transient ischemic attack) 03/2020   Wears glasses     Family History  Problem Relation Age of Onset   Breast cancer Mother    Hypertension Mother    Heart disease Father    Lung cancer Father    Hypertension Father    Breast cancer Sister    Stroke Maternal Grandmother     Past Surgical History:  Procedure Laterality Date   APPENDECTOMY     CARPAL TUNNEL RELEASE  04/23/2012   Procedure: CARPAL TUNNEL RELEASE;  Surgeon: Wyn Forster., MD;  Location: [facility];  Service:  Orthopedics;  Laterality: Left;   CARPAL TUNNEL RELEASE  06/27/2012   Procedure: CARPAL TUNNEL RELEASE;  Surgeon: Wyn Forster., MD;  Location: [facility];  Service: Orthopedics;  Laterality: Right;   CERVICAL DISCECTOMY  2007   colonscopy     polyps removed / benign   INCISIONAL HERNIA REPAIR  2011   subcostal from append   JOINT REPLACEMENT  81,87   lt total hip   LAPAROSCOPIC APPENDECTOMY  2010   required open repair   LOOP RECORDER INSERTION N/A 03/25/2020   Procedure: LOOP RECORDER INSERTION;  Surgeon: Hillis Range, MD;  Location: [facility];  Service: Cardiovascular;  Laterality: N/A;   REVERSE SHOULDER ARTHROPLASTY Right 08/18/2021   Procedure: REVERSE SHOULDER ARTHROPLASTY;  Surgeon: Francena Hanly,  MD;  Location: [facility];  Service: Orthopedics;  Laterality: Right;    SHOULDER ARTHROSCOPY WITH ROTATOR CUFF REPAIR Right 02/12/2020   Procedure: SHOULDER ARTHROSCOPY WITH ROTATOR CUFF REPAIR with subacromial decompression distal clavicle resection biceps tenotomy labral debridement;  Surgeon: Eugenia Mcalpine, MD;  Location: [facility];  Service: Orthopedics;  Laterality: Right;  interscalene block   TONSILLECTOMY     TOTAL HIP ARTHROPLASTY     x3 left hip [phone]   TOTAL HIP ARTHROPLASTY Right 04/05/2016   Procedure: RIGHT TOTAL HIP ARTHROPLASTY ANTERIOR APPROACH;  Surgeon: Ollen Gross, MD;  Location: [facility];  Service: Orthopedics;  Laterality: Right;   Social History   Occupational History   Occupation: Heritage manager: [employer]  Tobacco Use   Smoking status: Never   Smokeless tobacco: Never  Vaping Use   Vaping status: Never Used  Substance and Sexual Activity   Alcohol use: Yes    Comment: ocassionally   Drug use: No   Sexual activity: Not on file

## 2023-06-18 NOTE — Progress Notes (Signed)
Carelink Summary Report / Loop Recorder 

## 2023-07-08 ENCOUNTER — Ambulatory Visit
Admission: RE | Admit: 2023-07-08 | Discharge: 2023-07-08 | Disposition: A | Discharge status: Home / Self Care | Payer: Medicare Other | Source: Ambulatory Visit | Attending: Orthopedic Surgery | Admitting: Orthopedic Surgery

## 2023-07-08 DIAGNOSIS — M23322 Other meniscus derangements, posterior horn of medial meniscus, left knee: Secondary | ICD-10-CM | POA: Diagnosis not present

## 2023-07-08 DIAGNOSIS — M1712 Unilateral primary osteoarthritis, left knee: Secondary | ICD-10-CM | POA: Diagnosis not present

## 2023-07-08 DIAGNOSIS — M25562 Pain in left knee: Secondary | ICD-10-CM

## 2023-07-08 DIAGNOSIS — M25462 Effusion, left knee: Secondary | ICD-10-CM | POA: Diagnosis not present

## 2023-07-09 ENCOUNTER — Ambulatory Visit (INDEPENDENT_AMBULATORY_CARE_PROVIDER_SITE_OTHER): Payer: Medicare Other

## 2023-07-09 DIAGNOSIS — I639 Cerebral infarction, unspecified: Secondary | ICD-10-CM

## 2023-07-09 LAB — CUP PACEART REMOTE DEVICE CHECK
Date Time Interrogation Session: 20241019230159
Implantable Pulse Generator Implant Date: 20210708

## 2023-07-24 NOTE — Progress Notes (Signed)
Carelink Summary Report / Loop Recorder 

## 2023-07-27 ENCOUNTER — Encounter: Payer: Self-pay | Admitting: Orthopedic Surgery

## 2023-07-29 NOTE — Progress Notes (Signed)
Does he have f/u?

## 2023-07-30 ENCOUNTER — Telehealth: Payer: Self-pay

## 2023-07-30 NOTE — Telephone Encounter (Signed)
Please schedule appt to review MRI

## 2023-07-30 NOTE — Telephone Encounter (Signed)
-----   Message from Burnard Bunting sent at 07/29/2023  2:13 PM EST ----- Does he have f/u?

## 2023-08-13 ENCOUNTER — Ambulatory Visit: Payer: Medicare Other

## 2023-08-13 DIAGNOSIS — I639 Cerebral infarction, unspecified: Secondary | ICD-10-CM

## 2023-08-15 LAB — CUP PACEART REMOTE DEVICE CHECK
Date Time Interrogation Session: 20241126110020
Implantable Pulse Generator Implant Date: 20210708

## 2023-08-20 ENCOUNTER — Ambulatory Visit (INDEPENDENT_AMBULATORY_CARE_PROVIDER_SITE_OTHER): Payer: Medicare Other | Admitting: Orthopedic Surgery

## 2023-08-20 ENCOUNTER — Telehealth: Payer: Self-pay

## 2023-08-20 ENCOUNTER — Encounter: Payer: Self-pay | Admitting: Orthopedic Surgery

## 2023-08-20 DIAGNOSIS — M1712 Unilateral primary osteoarthritis, left knee: Secondary | ICD-10-CM | POA: Diagnosis not present

## 2023-08-20 NOTE — Telephone Encounter (Signed)
Auth needed for left knee gel 

## 2023-08-20 NOTE — Progress Notes (Signed)
Office Visit Note   Patient: Colin [name]           Date of Birth: [birth year]           MRN: [id number] Visit Date: 08/20/2023 Requested by: Shawnie Dapper, PA-C [address] [place],  [place] [address] PCP: [name]  Subjective: Chief Complaint  Patient presents with   Left Knee - Follow-up    HPI: Colin [name] is a 72 y.o. male who presents to the office reporting left knee pain.  Since he was last seen has had an MRI scan.  Patient states it is not quite as bad as it used to be but still hurts.  Hurts when he is sitting in the car.  Patient states the knee needs to really be fully straight or bent to 90 degrees.  Describes the pain is retropatellar.  Does hurt at night for the most part and during the daytime he is generally okay.  Prior cortisone injection helped him for 2 weeks.  He has a history of hyaluronic acid injections in the knee which have helped him for longer.  Has not had a gel injection for over 6 months.  Does cardio into the pool for exercise which is easier on his knee..                ROS: All systems reviewed are negative as they relate to the chief complaint within the history of present illness.  Patient denies fevers or chills.  Assessment & Plan: Visit Diagnoses:  1. Patellofemoral arthritis of left knee     Plan: Impression is left knee pain with primarily patellofemoral arthritis.  MRI scan is reviewed with the patient today.  Does have some degenerative fraying of that medial meniscus but nothing really definitively arthroscopically treatable in the knee that is going to give him predictable pain relief.  Not really ready for knee replacement yet which would be primarily for patellofemoral arthritis.  Gel injection indicated.  Follow-up in 3 weeks for that shot. This patient is diagnosed with osteoarthritis of the knee(s).    Radiographs show evidence of joint space narrowing, osteophytes, subchondral sclerosis and/or subchondral  cysts.  This patient has knee pain which interferes with functional and activities of daily living.    This patient has experienced inadequate response, adverse effects and/or intolerance with conservative treatments such as acetaminophen, NSAIDS, topical creams, physical therapy or regular exercise, knee bracing and/or weight loss.   This patient has experienced inadequate response or has a contraindication to intra articular steroid injections for at least 3 months.   This patient is not scheduled to have a total knee replacement within 6 months of starting treatment with viscosupplementation.   Follow-Up Instructions: No follow-ups on file.   Orders:  No orders of the defined types were placed in this encounter.  No orders of the defined types were placed in this encounter.     Procedures: No procedures performed   Clinical Data: No additional findings.  Objective: Vital Signs: There were no vitals taken for this visit.  Physical Exam:  Constitutional: Patient appears well-developed HEENT:  Head: Normocephalic Eyes:EOM are normal Neck: Normal range of motion Cardiovascular: Normal rate Pulmonary/chest: Effort normal Neurologic: Patient is alert Skin: Skin is warm Psychiatric: Patient has normal mood and affect  Ortho Exam: Ortho exam demonstrates slightly antalgic gait to the left.  No effusion in the left or right knee.  Does have's patellofemoral crepitus bilaterally.  Collateral  crucial ligaments are stable.  No focal joint line tenderness today.  Pedal pulses palpable.  Range of motion is about 5-1 20 of flexion.  No groin pain with internal/external rotation of the left leg and no nerve root tension signs.  Specialty Comments:  No specialty comments available.  Imaging: No results found.   PMFS History: Patient Active Problem List   Diagnosis Date Noted   COVID-19 virus infection 07/05/2022   OSA treated with BiPAP 04/01/2021   Cryptogenic stroke (HCC)  03/23/2020   Swelling of lower extremity 03/15/2020   Bilateral leg paresthesia 03/15/2020   Right rotator cuff tear 02/12/2020   Postinflammatory pulmonary fibrosis (HCC) 07/31/2019   History of 2019 novel coronavirus disease (COVID-19) 07/27/2019   Hypothyroidism 05/19/2019   GERD (gastroesophageal reflux disease) 05/19/2019   Hyperlipidemia 05/19/2019   OA (osteoarthritis) of hip 04/05/2016   Hypertension    Past Medical History:  Diagnosis Date   Arthritis    Barrett's esophagus    Bowel trouble    Stool leakage since colonscopy 02/2016   Cancer (HCC)    skin cancer   Carpal tunnel syndrome, bilateral    Depression    in teenage years    GERD (gastroesophageal reflux disease)    History of COVID-19    Hyperlipemia    Hypertension    Hypothyroidism    Interstitial lung disease (HCC)    OSA treated with BiPAP 04/01/2021   Peripheral neuropathy    Pneumonia 04/2019   Stroke (HCC) 03/2020   cryptogenic   TIA (transient ischemic attack) 03/2020   Wears glasses     Family History  Problem Relation Age of Onset   Breast cancer Mother    Hypertension Mother    Heart disease Father    Lung cancer Father    Hypertension Father    Breast cancer Sister    Stroke Maternal Grandmother     Past Surgical History:  Procedure Laterality Date   APPENDECTOMY     CARPAL TUNNEL RELEASE  04/23/2012   Procedure: CARPAL TUNNEL RELEASE;  Surgeon: Wyn Forster., MD;  Location: [facility];  Service: Orthopedics;  Laterality: Left;   CARPAL TUNNEL RELEASE  06/27/2012   Procedure: CARPAL TUNNEL RELEASE;  Surgeon: Wyn Forster., MD;  Location: [facility];  Service: Orthopedics;  Laterality: Right;   CERVICAL DISCECTOMY  2007   colonscopy     polyps removed / benign   INCISIONAL HERNIA REPAIR  2011   subcostal from append   JOINT REPLACEMENT  81,87   lt total hip   LAPAROSCOPIC APPENDECTOMY  2010   required open repair   LOOP RECORDER  INSERTION N/A 03/25/2020   Procedure: LOOP RECORDER INSERTION;  Surgeon: Hillis Range, MD;  Location: [facility];  Service: Cardiovascular;  Laterality: N/A;   REVERSE SHOULDER ARTHROPLASTY Right 08/18/2021   Procedure: REVERSE SHOULDER ARTHROPLASTY;  Surgeon: Francena Hanly, MD;  Location: [facility];  Service: Orthopedics;  Laterality: Right;    SHOULDER ARTHROSCOPY WITH ROTATOR CUFF REPAIR Right 02/12/2020   Procedure: SHOULDER ARTHROSCOPY WITH ROTATOR CUFF REPAIR with subacromial decompression distal clavicle resection biceps tenotomy labral debridement;  Surgeon: Eugenia Mcalpine, MD;  Location: [facility];  Service: Orthopedics;  Laterality: Right;  interscalene block   TONSILLECTOMY     TOTAL HIP ARTHROPLASTY     x3 left hip [phone]   TOTAL HIP ARTHROPLASTY Right 04/05/2016   Procedure: RIGHT TOTAL HIP ARTHROPLASTY ANTERIOR APPROACH;  Surgeon: Homero Fellers  Aluisio, MD;  Location: [facility];  Service: Orthopedics;  Laterality: Right;   Social History   Occupational History   Occupation: Heritage manager: [employer]  Tobacco Use   Smoking status: Never   Smokeless tobacco: Never  Vaping Use   Vaping status: Never Used  Substance and Sexual Activity   Alcohol use: Yes    Comment: ocassionally   Drug use: No   Sexual activity: Not on file

## 2023-09-06 NOTE — Progress Notes (Signed)
Carelink Summary Report / Loop Recorder 

## 2023-09-06 NOTE — Addendum Note (Signed)
Addended by: Geralyn Flash D on: 09/06/2023 01:36 PM   Modules accepted: Orders

## 2023-09-17 ENCOUNTER — Ambulatory Visit (INDEPENDENT_AMBULATORY_CARE_PROVIDER_SITE_OTHER): Payer: Medicare Other

## 2023-09-17 DIAGNOSIS — I639 Cerebral infarction, unspecified: Secondary | ICD-10-CM | POA: Diagnosis not present

## 2023-09-17 LAB — CUP PACEART REMOTE DEVICE CHECK: Date Time Interrogation Session: 20241229230259

## 2023-10-22 ENCOUNTER — Ambulatory Visit (INDEPENDENT_AMBULATORY_CARE_PROVIDER_SITE_OTHER): Payer: Medicare Other

## 2023-10-22 DIAGNOSIS — I639 Cerebral infarction, unspecified: Secondary | ICD-10-CM | POA: Diagnosis not present

## 2023-10-22 LAB — CUP PACEART REMOTE DEVICE CHECK: Date Time Interrogation Session: 20250202230146

## 2023-11-26 ENCOUNTER — Ambulatory Visit: Payer: Medicare Other

## 2023-11-26 DIAGNOSIS — I639 Cerebral infarction, unspecified: Secondary | ICD-10-CM

## 2023-11-27 LAB — CUP PACEART REMOTE DEVICE CHECK: Date Time Interrogation Session: 20250309230206

## 2023-11-27 NOTE — Progress Notes (Signed)
 [facility] Summary Report / Loop Recorder

## 2023-12-27 NOTE — Addendum Note (Signed)
 Addended by: Geralyn Flash D on: 12/27/2023 03:43 PM   Modules accepted: Orders

## 2023-12-27 NOTE — Progress Notes (Signed)
 [facility] Summary Report / Loop Recorder

## 2023-12-31 ENCOUNTER — Ambulatory Visit (INDEPENDENT_AMBULATORY_CARE_PROVIDER_SITE_OTHER): Payer: Medicare Other

## 2023-12-31 DIAGNOSIS — I639 Cerebral infarction, unspecified: Secondary | ICD-10-CM | POA: Diagnosis not present

## 2024-01-01 LAB — CUP PACEART REMOTE DEVICE CHECK: Date Time Interrogation Session: 20250413230219

## 2024-02-04 ENCOUNTER — Ambulatory Visit: Payer: Medicare Other

## 2024-02-04 ENCOUNTER — Ambulatory Visit: Payer: Self-pay | Admitting: Cardiology

## 2024-02-04 DIAGNOSIS — I639 Cerebral infarction, unspecified: Secondary | ICD-10-CM

## 2024-02-04 LAB — CUP PACEART REMOTE DEVICE CHECK: Date Time Interrogation Session: 20250518230634

## 2024-02-14 NOTE — Progress Notes (Signed)
 [facility] Summary Report / Loop Recorder

## 2024-02-14 NOTE — Addendum Note (Signed)
 Addended by: Edra Govern D on: 02/14/2024 01:08 PM   Modules accepted: Orders

## 2024-02-18 ENCOUNTER — Telehealth: Payer: Self-pay

## 2024-02-18 NOTE — Telephone Encounter (Signed)
 Alert received from [employer] for 8 logged AF events, current event in progress from 6/1, SR with frequent PAC's and some PVC's vs AF - route to clinic for review.  No OAC per EPIC.  Reviewed with Michaelle Adolphus, PA-C who advises referral to AF clinic to discuss starting OAC d/t AF noted on ILR. ILR was implanted for CVA.  Attempted to contact patient. No answer, left message to call back.

## 2024-02-18 NOTE — Telephone Encounter (Signed)
 Patient returned call. Patient denies any symptoms.   Recommend AF clinic referral to discuss starting OAC d/t increased risk of CVA. Pt agreeable to plan.

## 2024-02-20 DIAGNOSIS — Z981 Arthrodesis status: Secondary | ICD-10-CM | POA: Diagnosis not present

## 2024-02-20 DIAGNOSIS — Z1330 Encounter for screening examination for mental health and behavioral disorders, unspecified: Secondary | ICD-10-CM | POA: Diagnosis not present

## 2024-02-20 DIAGNOSIS — M4726 Other spondylosis with radiculopathy, lumbar region: Secondary | ICD-10-CM | POA: Diagnosis not present

## 2024-02-25 ENCOUNTER — Ambulatory Visit (HOSPITAL_COMMUNITY)
Admission: RE | Admit: 2024-02-25 | Discharge: 2024-02-25 | Disposition: A | Discharge status: Home / Self Care | Source: Ambulatory Visit | Attending: Internal Medicine | Admitting: Internal Medicine

## 2024-02-25 ENCOUNTER — Other Ambulatory Visit (HOSPITAL_COMMUNITY): Payer: Self-pay | Admitting: *Deleted

## 2024-02-25 VITALS — BP 102/70 | HR 62 | Ht 73.0 in | Wt 262.2 lb

## 2024-02-25 DIAGNOSIS — I2510 Atherosclerotic heart disease of native coronary artery without angina pectoris: Secondary | ICD-10-CM | POA: Diagnosis not present

## 2024-02-25 DIAGNOSIS — I4891 Unspecified atrial fibrillation: Secondary | ICD-10-CM | POA: Diagnosis not present

## 2024-02-25 DIAGNOSIS — Z8673 Personal history of transient ischemic attack (TIA), and cerebral infarction without residual deficits: Secondary | ICD-10-CM | POA: Insufficient documentation

## 2024-02-25 DIAGNOSIS — D6869 Other thrombophilia: Secondary | ICD-10-CM | POA: Diagnosis not present

## 2024-02-25 DIAGNOSIS — I480 Paroxysmal atrial fibrillation: Secondary | ICD-10-CM | POA: Insufficient documentation

## 2024-02-25 DIAGNOSIS — I10 Essential (primary) hypertension: Secondary | ICD-10-CM | POA: Diagnosis not present

## 2024-02-25 DIAGNOSIS — Z7901 Long term (current) use of anticoagulants: Secondary | ICD-10-CM | POA: Diagnosis not present

## 2024-02-25 MED ORDER — APIXABAN 5 MG PO TABS: 5.0000 mg | ORAL_TABLET | Freq: Two times a day (BID) | ORAL | 3 refills | Status: DC

## 2024-02-25 NOTE — Progress Notes (Signed)
 Primary Care Physician: Jodi Munroe Primary Cardiologist: Jolly Needle, MD (Inactive) Electrophysiologist: None     Referring Physician: Device clinic     Colin [name] is a 73 y.o. male with a history of HTN, HLD, CVA, CAD, thoracic aortic aneurysm, and atrial fibrillation who presents for consultation in the [facility] Atrial Fibrillation [facility]. Device alert on 6/2 for new Afib. Patient has a CHADS2VASC score of 5.  On evaluation today, he is currently in NSR. He did not have cardiac awareness. He has an Apple watch. He is on plavix  which was continued by Neurology after stroke.   Today, he denies symptoms of palpitations, chest pain, shortness of breath, orthopnea, PND, lower extremity edema, dizziness, presyncope, syncope, snoring, daytime somnolence, bleeding, or neurologic sequela. The patient is tolerating medications without difficulties and is otherwise without complaint today.   he has a BMI of Body mass index is 34.59 kg/m.Aaron Aas Filed Weights   02/25/24 1426  Weight: 118.9 kg    Current Outpatient Medications  Medication Sig Dispense Refill   apixaban  (ELIQUIS ) 5 MG TABS tablet Take 1 tablet (5 mg total) by mouth 2 (two) times daily. 60 tablet 3   Ascorbic Acid (VITAMIN C PO) Take 1 tablet by mouth every morning.     levothyroxine  (SYNTHROID ) 75 MCG tablet Take 75 mcg by mouth daily.      losartan -hydrochlorothiazide  (HYZAAR) 50-12.5 MG tablet Take 1 tablet by mouth daily.     Multiple Vitamin (MULTIVITAMIN ADULT PO) Take 1 tablet by mouth every morning.     Omega-3 Fatty Acids (FISH OIL) 1000 MG CAPS Take 1,000 mg by mouth daily.      pantoprazole  (PROTONIX ) 40 MG tablet Take 40 mg by mouth daily as needed for heartburn or indigestion.     rosuvastatin  (CRESTOR ) 20 MG tablet Take 1 tablet (20 mg total) by mouth daily. 90 tablet 1   sildenafil (VIAGRA) 25 MG tablet Take 25 mg by mouth daily as needed for erectile dysfunction.     Turmeric (QC TUMERIC  COMPLEX PO) Take 1 tablet by mouth every morning.     VITAMIN D PO Taking 2 gel capsules by mouth daily     No current facility-administered medications for this encounter.    Atrial Fibrillation Management history:  Previous antiarrhythmic drugs: none Previous cardioversions: none Previous ablations: none Anticoagulation history: none   ROS- All systems are reviewed and negative except as per the HPI above.  Physical Exam: BP 102/70   Pulse 62   Ht 6\' 1"  (1.854 m)   Wt 118.9 kg   BMI 34.59 kg/m   GEN: Well nourished, well developed in no acute distress NECK: No JVD; No carotid bruits CARDIAC: Regular rate and rhythm, no murmurs, rubs, gallops RESPIRATORY:  Clear to auscultation without rales, wheezing or rhonchi  ABDOMEN: Soft, non-tender, non-distended EXTREMITIES:  No edema; No deformity   EKG today demonstrates  Vent. rate 62 BPM PR interval 180 ms QRS duration 88 ms QT/QTcB 372/377 ms P-R-T axes 19 -17 -11 Sinus rhythm with Premature atrial complexes with Abberant conduction Cannot rule out Anterior infarct , age undetermined Abnormal ECG When compared with ECG of 23-Mar-2020 15:56, Abberant conduction is now Present  Echo 03/24/20 demonstrated   1. Left ventricular ejection fraction, by estimation, is 60 to 65%. The  left ventricle has normal function. The left ventricle has no regional  wall motion abnormalities. Left ventricular diastolic parameters were  normal.   2. Right ventricular systolic  function is normal. The right ventricular  size is normal.   3. Negative bubble study for right to left shunt.   4. The mitral valve is normal in structure. No evidence of mitral valve  regurgitation. No evidence of mitral stenosis.   5. Calcified non coronary cusp. The aortic valve was not well visualized.  Aortic valve regurgitation is not visualized. Mild to moderate aortic  valve sclerosis/calcification is present, without any evidence of aortic  stenosis.    6. The inferior vena cava is normal in size with greater than 50%  respiratory variability, suggesting right atrial pressure of 3 mmHg.   ASSESSMENT & PLAN CHA2DS2-VASc Score = 5  The patient's score is based upon: CHF History: 0 HTN History: 1 Diabetes History: 0 Stroke History: 2 Vascular Disease History: 1 Age Score: 1 Gender Score: 0       ASSESSMENT AND PLAN: Paroxysmal Atrial Fibrillation (ICD10:  I48.0) The patient's CHA2DS2-VASc score is 5, indicating a 7.2% annual risk of stroke.    He is currently in NSR. We discussed Afib found on ILR and the indication to begin anticoagulation. We discussed continuation of observation via subsequent ILR review.    Secondary Hypercoagulable State (ICD10:  D68.69) The patient is at significant risk for stroke/thromboembolism based upon his CHA2DS2-VASc Score of 5.   We discussed the risks vs benefits of anticoagulation for recurrent stroke prevention due to Afib. After discussion, patient agrees to begin anticoagulation. Will stop plavix . Begin Eliquis  5 mg BID. Draw CBC in 1 month.   Follow up 6 months Afib clinic.    Minnie Amber, PA-C  Afib Clinic [facility] [address] [place], [place] [address] [phone]

## 2024-02-25 NOTE — Patient Instructions (Signed)
 Stop plavix   Start Eliquis  5mg  twice a day  Please have labs drawn in 1 month at any [facility] -- orders attached.   Beginning in 2025, the prescription drug law requires all Medicare prescription drug plans (Medicare Part D plans)--including both standalone Medicare prescription drug plans and Medicare Advantage plans with prescription drug coverage--to offer Part D enrollees the option to pay out-of-pocket prescription drug costs in the form of monthly payments instead of all at once at the pharmacy. This program, called the Medicare Prescription Payment Plan, will be helpful for people with high cost sharing earlier in the plan year by spreading out those expenses over the course of the plan year.  What is the Medicare Prescription Payment Plan? The Medicare Prescription Payment Plan is a new payment option created under the Inflation Reduction Act that requires Part D plan sponsors to provide their enrollees with the option to pay out-of-pocket prescription drug costs in the form of monthly payments over the course of the plan year instead of all at once to the pharmacy. The program begins on September 19, 2023.  Program participants will pay $0 to the pharmacy for covered Part D drugs, and Part D plan sponsors will then bill program participants monthly for any cost sharing they incur while in the program. Pharmacies will be paid in full by the Part D sponsor in accordance with Part D prompt payment requirements  If there are issues with costs of your medications with new medicare changes ---call your DRUG INSURANCE to discuss payment plan to reduce your monthly costs.

## 2024-02-25 NOTE — Addendum Note (Signed)
 Encounter addended by: Missouri Amor, RN on: 02/25/2024 4:22 PM  Actions taken: Order list changed

## 2024-03-03 DIAGNOSIS — I480 Paroxysmal atrial fibrillation: Secondary | ICD-10-CM | POA: Diagnosis not present

## 2024-03-03 DIAGNOSIS — E785 Hyperlipidemia, unspecified: Secondary | ICD-10-CM | POA: Diagnosis not present

## 2024-03-03 DIAGNOSIS — E039 Hypothyroidism, unspecified: Secondary | ICD-10-CM | POA: Diagnosis not present

## 2024-03-03 DIAGNOSIS — I10 Essential (primary) hypertension: Secondary | ICD-10-CM | POA: Diagnosis not present

## 2024-03-03 DIAGNOSIS — J8410 Pulmonary fibrosis, unspecified: Secondary | ICD-10-CM | POA: Diagnosis not present

## 2024-03-06 ENCOUNTER — Ambulatory Visit (INDEPENDENT_AMBULATORY_CARE_PROVIDER_SITE_OTHER)

## 2024-03-06 ENCOUNTER — Ambulatory Visit: Payer: Self-pay | Admitting: Cardiology

## 2024-03-06 DIAGNOSIS — I4891 Unspecified atrial fibrillation: Secondary | ICD-10-CM

## 2024-03-06 LAB — CUP PACEART REMOTE DEVICE CHECK: Date Time Interrogation Session: 20250618230324

## 2024-03-07 DIAGNOSIS — M545 Low back pain, unspecified: Secondary | ICD-10-CM | POA: Diagnosis not present

## 2024-03-12 DIAGNOSIS — M545 Low back pain, unspecified: Secondary | ICD-10-CM | POA: Diagnosis not present

## 2024-03-14 DIAGNOSIS — M545 Low back pain, unspecified: Secondary | ICD-10-CM | POA: Diagnosis not present

## 2024-03-18 DIAGNOSIS — M545 Low back pain, unspecified: Secondary | ICD-10-CM | POA: Diagnosis not present

## 2024-03-20 NOTE — Progress Notes (Signed)
 [facility] Summary Report / Loop Recorder

## 2024-03-25 DIAGNOSIS — M545 Low back pain, unspecified: Secondary | ICD-10-CM | POA: Diagnosis not present

## 2024-03-27 DIAGNOSIS — M545 Low back pain, unspecified: Secondary | ICD-10-CM | POA: Diagnosis not present

## 2024-04-07 ENCOUNTER — Ambulatory Visit

## 2024-04-07 DIAGNOSIS — I4891 Unspecified atrial fibrillation: Secondary | ICD-10-CM

## 2024-04-08 LAB — CUP PACEART REMOTE DEVICE CHECK: Date Time Interrogation Session: 20250720230455

## 2024-04-16 ENCOUNTER — Ambulatory Visit: Payer: Self-pay | Admitting: Cardiology

## 2024-05-05 NOTE — Progress Notes (Signed)
 [facility] Summary Report / Loop Recorder

## 2024-05-06 ENCOUNTER — Telehealth: Payer: Self-pay | Admitting: Cardiology

## 2024-05-06 NOTE — Telephone Encounter (Signed)
 Patient c/o Palpitations: STAT if patient c/o lightheadedness, shortness of breath, or chest pain  How long have you had palpitations/irregular HR/ Afib? Are you having the symptoms now? 3 weeks ago   Are you currently experiencing lightheadedness, SOB or CP? No  Do you have a history of afib (atrial fibrillation) or irregular heart rhythm? Yes  Have you checked your BP or HR? (document readings if available): No  Are you experiencing any other symptoms? No

## 2024-05-06 NOTE — Telephone Encounter (Signed)
 Called patient back about message. Patient stated my watch and loop recorder, were telling me I am in A. FIB, but recently his loop recorder has not been connecting. Will send message to device to help with this. Patient stated he was put on eliquis  about 3 weeks ago. Patient stated he is feeling really weak and his HR is in the 40's. Patient stated he knows he has an appointment next month with Dr. Pietro, but he feels like this should not wait with the way he has been feeling. Made patient the first available DOD appointment. Patient encouraged to go to ED if symptoms get worse. Will forward to Dr. Pietro for further advisement.

## 2024-05-06 NOTE — Telephone Encounter (Signed)
 Called patient to discuss recent loop recording from 05/06/24   Irregular rhythm with frequent compensatory pauses after suspected premature beats noted  Different types of premature beats were explained to patient and stated they can be causing  patients watch to think his heart rate is a lot lower than it actually is and can also cause fatigue and weakness possibly  Patient instructed to go to ED if his symptoms worsen or he becomes SOB or develops chest pain  Patient verbalized understanding  Patient has f/u scheduled for this Thursday with Lavona, MD  All questions and concerns addressed at this time  Patient appreciative of call back and information provided by this RN

## 2024-05-07 ENCOUNTER — Encounter: Payer: Self-pay | Admitting: Cardiology

## 2024-05-07 ENCOUNTER — Ambulatory Visit: Admitting: Cardiology

## 2024-05-07 DIAGNOSIS — I2510 Atherosclerotic heart disease of native coronary artery without angina pectoris: Secondary | ICD-10-CM | POA: Insufficient documentation

## 2024-05-07 DIAGNOSIS — I77810 Thoracic aortic ectasia: Secondary | ICD-10-CM | POA: Insufficient documentation

## 2024-05-07 NOTE — Progress Notes (Unsigned)
 Cardiology Office Note:   Date:  05/08/2024  ID:  Colin [name], DOB 1951/08/09, MRN [id number] PCP: Colin [name]  [facility] Providers Cardiologist:  Lynwood Rakers, MD (Inactive) {  History of Present Illness:   Colin [name] is a 73 y.o. male with a history of HTN, HLD, CVA, CAD, thoracic aortic aneurysm, and atrial fibrillation he had had a cryptogenic stroke.  Implanted monitor demonstrated PAF.  The patient has a CHADS2VASC score of 5. This is my first visit with him.    He has been seen in the Atrial Fib Clinic.  He has been having more atrial fib  events on his Watch.  However, he is not really feeling these.  He does not have any symptomatic tachypalpitations.  He is not having any presyncope or syncope.  He denies any shortness of breath, PND or orthopnea.  He said no chest pressure, neck or arm discomfort.  He said no weight gain or edema.  He is limited by back pain but he does do some pool exercises.  He was added to my schedule because he did have some compensatory pauses after premature beats on his device 819.  However, these were very short-lived.  Again he did not have any symptoms related to this.  ROS: As stated in the HPI and negative for all other systems.  Studies Reviewed:    EKG:   EKG Interpretation Date/Time:  Thursday May 08 2024 14:47:52 EDT Ventricular Rate:  53 PR Interval:  190 QRS Duration:  96 QT Interval:  436 QTC Calculation: 409 R Axis:   17  Text Interpretation: Sinus bradycardia with Premature atrial complexes Possible Inferior infarct , age undetermined When compared with ECG of 25-Feb-2024 14:37, Premature ventricular complexes are no longer Present Premature atrial complexes are now Present Confirmed by Lavona Lynwood ([id number]) on 05/08/2024 2:53:53 PM    Risk Assessment/Calculations:    CHA2DS2-VASc Score = 5   This indicates a 7.2% annual risk of stroke. The patient's score is based upon: CHF History: 0 HTN History:  1 Diabetes History: 0 Stroke History: 2 Vascular Disease History: 1 Age Score: 1 Gender Score: 0   Physical Exam:   VS:  BP 113/73   Pulse (!) 53   Ht 6' 1 (1.854 m)   Wt 263 lb (119.3 kg)   SpO2 98%   BMI 34.70 kg/m    Wt Readings from Last 3 Encounters:  05/08/24 263 lb (119.3 kg)  02/25/24 262 lb 3.2 oz (118.9 kg)  05/15/23 249 lb 9.6 oz (113.2 kg)     GEN: Well nourished, well developed in no acute distress NECK: No JVD; No carotid bruits CARDIAC: RRR, no murmurs, rubs, gallops RESPIRATORY:  Clear to auscultation without rales, wheezing or rhonchi  ABDOMEN: Soft, non-tender, non-distended EXTREMITIES:  No edema; No deformity   ASSESSMENT AND PLAN:   Coronary artery disease: Interestingly I do not see a cardiac catheterization.  I see perfusion testing previously.  He does not recall any stents or caths.  He is off Plavix .  He we will continue with risk reduction.  I am not exactly sure where the diagnosis of coronary artery disease came from.  Hyperlipidemia: His LDL is followed by his PCP.   I see an LDL from last year over 57.  This is at target.  No change in therapy.    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  No specific follow-up was warranted.   CVA: We had a long discussion about this cryptogenic stroke and its likely that this was related to atrial fibrillation that was just diagnosed.   See the discussion below    Atrial fib: He tolerates anticoagulation.  At this point he will continue the Eliquis .  We talked about the fact that he could get the device explanted but I do not suggest this .  He will leave it in.  He is nearing the end of the battery life.  We had a long discussion about fibrillation.  At this point I do not think there is a need to consider ablation as he is not symptomatic and it is briefly paroxysmal.  He will continue meds as listed.   Follow up  with Dr. Pietro in Jan.   Signed, Lynwood Schilling, MD

## 2024-05-08 ENCOUNTER — Encounter: Payer: Self-pay | Admitting: Cardiology

## 2024-05-08 ENCOUNTER — Ambulatory Visit (INDEPENDENT_AMBULATORY_CARE_PROVIDER_SITE_OTHER)

## 2024-05-08 ENCOUNTER — Ambulatory Visit: Payer: Self-pay | Admitting: Cardiology

## 2024-05-08 ENCOUNTER — Ambulatory Visit: Attending: Cardiology | Admitting: Cardiology

## 2024-05-08 VITALS — BP 113/73 | HR 53 | Ht 73.0 in | Wt 263.0 lb

## 2024-05-08 DIAGNOSIS — I77810 Thoracic aortic ectasia: Secondary | ICD-10-CM | POA: Insufficient documentation

## 2024-05-08 DIAGNOSIS — R0602 Shortness of breath: Secondary | ICD-10-CM | POA: Diagnosis not present

## 2024-05-08 DIAGNOSIS — I2510 Atherosclerotic heart disease of native coronary artery without angina pectoris: Secondary | ICD-10-CM | POA: Diagnosis not present

## 2024-05-08 DIAGNOSIS — E785 Hyperlipidemia, unspecified: Secondary | ICD-10-CM | POA: Insufficient documentation

## 2024-05-08 DIAGNOSIS — I480 Paroxysmal atrial fibrillation: Secondary | ICD-10-CM | POA: Diagnosis not present

## 2024-05-08 DIAGNOSIS — I4891 Unspecified atrial fibrillation: Secondary | ICD-10-CM

## 2024-05-08 DIAGNOSIS — I639 Cerebral infarction, unspecified: Secondary | ICD-10-CM | POA: Insufficient documentation

## 2024-05-08 LAB — CUP PACEART REMOTE DEVICE CHECK: Date Time Interrogation Session: 20250820230826

## 2024-05-08 NOTE — Patient Instructions (Signed)
 Medication Instructions:  Your physician recommends that you continue on your current medications as directed. Please refer to the Current Medication list given to you today.  *If you need a refill on your cardiac medications before your next appointment, please call your pharmacy*  Lab Work: NONE If you have labs (blood work) drawn today and your tests are completely normal, you will receive your results only by: MyChart Message (if you have MyChart) OR A paper copy in the mail If you have any lab test that is abnormal or we need to change your treatment, we will call you to review the results.  Testing/Procedures: NONE  Follow-Up: At [facility], you and your health needs are our priority.  As part of our continuing mission to provide you with exceptional heart care, our providers are all part of one team.  This team includes your primary Cardiologist (physician) and Advanced Practice Providers or APPs (Physician Assistants and Nurse Practitioners) who all work together to provide you with the care you need, when you need it.  Your next appointment:   January 2026  Provider:   Pietro, MD  We recommend signing up for the patient portal called MyChart.  Sign up information is provided on this After Visit Summary.  MyChart is used to connect with patients for Virtual Visits (Telemedicine).  Patients are able to view lab/test results, encounter notes, upcoming appointments, etc.  Non-urgent messages can be sent to your provider as well.   To learn more about what you can do with MyChart, go to [email or web address].

## 2024-05-12 DIAGNOSIS — L821 Other seborrheic keratosis: Secondary | ICD-10-CM | POA: Diagnosis not present

## 2024-05-12 DIAGNOSIS — L738 Other specified follicular disorders: Secondary | ICD-10-CM | POA: Diagnosis not present

## 2024-05-12 DIAGNOSIS — L570 Actinic keratosis: Secondary | ICD-10-CM | POA: Diagnosis not present

## 2024-05-12 DIAGNOSIS — D225 Melanocytic nevi of trunk: Secondary | ICD-10-CM | POA: Diagnosis not present

## 2024-05-21 DIAGNOSIS — E039 Hypothyroidism, unspecified: Secondary | ICD-10-CM | POA: Diagnosis not present

## 2024-05-21 DIAGNOSIS — K219 Gastro-esophageal reflux disease without esophagitis: Secondary | ICD-10-CM | POA: Diagnosis not present

## 2024-05-21 DIAGNOSIS — I10 Essential (primary) hypertension: Secondary | ICD-10-CM | POA: Diagnosis not present

## 2024-05-21 DIAGNOSIS — E785 Hyperlipidemia, unspecified: Secondary | ICD-10-CM | POA: Diagnosis not present

## 2024-05-21 DIAGNOSIS — Z0000 Encounter for general adult medical examination without abnormal findings: Secondary | ICD-10-CM | POA: Diagnosis not present

## 2024-05-21 DIAGNOSIS — J8410 Pulmonary fibrosis, unspecified: Secondary | ICD-10-CM | POA: Diagnosis not present

## 2024-05-21 DIAGNOSIS — I480 Paroxysmal atrial fibrillation: Secondary | ICD-10-CM | POA: Diagnosis not present

## 2024-05-21 DIAGNOSIS — R350 Frequency of micturition: Secondary | ICD-10-CM | POA: Diagnosis not present

## 2024-05-21 DIAGNOSIS — Z131 Encounter for screening for diabetes mellitus: Secondary | ICD-10-CM | POA: Diagnosis not present

## 2024-05-21 DIAGNOSIS — Z8673 Personal history of transient ischemic attack (TIA), and cerebral infarction without residual deficits: Secondary | ICD-10-CM | POA: Diagnosis not present

## 2024-05-22 ENCOUNTER — Encounter (HOSPITAL_BASED_OUTPATIENT_CLINIC_OR_DEPARTMENT_OTHER): Payer: Self-pay | Admitting: Pulmonary Disease

## 2024-05-23 NOTE — Telephone Encounter (Signed)
 FYI

## 2024-05-29 ENCOUNTER — Ambulatory Visit (HOSPITAL_BASED_OUTPATIENT_CLINIC_OR_DEPARTMENT_OTHER): Admitting: Pulmonary Disease

## 2024-05-29 ENCOUNTER — Encounter (HOSPITAL_BASED_OUTPATIENT_CLINIC_OR_DEPARTMENT_OTHER): Payer: Self-pay

## 2024-05-30 ENCOUNTER — Encounter (HOSPITAL_BASED_OUTPATIENT_CLINIC_OR_DEPARTMENT_OTHER): Payer: Self-pay | Admitting: Pulmonary Disease

## 2024-05-30 ENCOUNTER — Ambulatory Visit (HOSPITAL_BASED_OUTPATIENT_CLINIC_OR_DEPARTMENT_OTHER): Admitting: Pulmonary Disease

## 2024-05-30 VITALS — BP 127/78 | HR 56 | Ht 73.0 in | Wt 262.0 lb

## 2024-05-30 DIAGNOSIS — R0602 Shortness of breath: Secondary | ICD-10-CM

## 2024-05-30 DIAGNOSIS — G4733 Obstructive sleep apnea (adult) (pediatric): Secondary | ICD-10-CM | POA: Diagnosis not present

## 2024-05-30 NOTE — Patient Instructions (Signed)
 Shortness of breath -  Good exercise tolerance. Noticing it worsening --ORDER pulmonary function. Call with results. If abnormal consider SABA.  --Wear BiPAP during the entirety of sleep when you can --Continue regular aerobic activity --Declined Inspire Sleep Device

## 2024-05-30 NOTE — Progress Notes (Signed)
 @Patient  ID: Norleen CHRISTELLA Grave, male    DOB: 21-Aug-1951, 73 y.o.   MRN: [id number]  Chief Complaint  Patient presents with   Follow-up    Referring provider: Kristie Morene [name]  HPI: 73 year old male, never smoker. 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 Corrie for DOE. Normal PFTs.  2020 - Hospitalized at [facility] from 8/30-9/8 for COVID-19 pneumonia. Remained oxygen  dependent with PFTs showing restrictive defect and moderated DLCO reduction. Started on prolonged prednisone  taper in November 2021 - Off steroids in July. PFTs normalized. Hospitalized for TIA in July. Diagnosed with OSA. 2022 - OSA not controlled on CPAP. Started on BiPAP in June. 2023 - Covid infection.  On BIPAP 14/10. Titration study in June 2022 that showed he required pressure 16/12 2024 - Previously changed BiPAP to 16/12 cm H20 with some improvement in AHI 2025- Dx with afib in June. Active swimmer. Some shortness of breath  05/30/24 Since our last visit he is overall doing well. He was recently diagnosed with atrial fib this summer. Has had some shortness of breath. Has a morning cough but nothing that stands out. Denies wheezing. Compliant with CPAP for 7 hours nightly. He swims in the pool three times a week and has noticed only able to do intense exercise 10 min.  Review of Systems  Review of Systems  Constitutional:  Negative for chills, diaphoresis and fever.  HENT:  Negative for congestion.   Respiratory:  Positive for shortness of breath. Negative for cough and wheezing.   Cardiovascular:  Negative for chest pain, palpitations and leg swelling.   Physical Exam  BP 127/78   Pulse (!) 56   Ht 6' 1 (1.854 m)   Wt 262 lb (118.8 kg)   SpO2 98%   BMI 34.57 kg/m    Physical Exam: General:  Well-appearing, no acute distress HENT: [place], AT Eyes: EOMI, no scleral icterus Respiratory: Clear to auscultation bilaterally.  No crackles, wheezing or rales Cardiovascular: RRR, -M/R/G, no JVD Extremities:-Edema,-tenderness Neuro: AAO x4, CNII-XII grossly intact Psych: Normal mood, normal affect  Lab Results:  CBC    Component Value Date/Time   WBC 5.4 08/04/2021 0844   RBC 4.40 08/04/2021 0844   HGB 13.3 08/04/2021 0844   HCT 41.5 08/04/2021 0844   PLT 214 08/04/2021 0844   MCV 94.3 08/04/2021 0844   MCH 30.2 08/04/2021 0844   MCHC 32.0 08/04/2021 0844   RDW 13.2 08/04/2021 0844   LYMPHSABS 1.6 03/23/2020 1617   MONOABS 0.6 03/23/2020 1617   EOSABS 0.3 03/23/2020 1617   BASOSABS 0.1 03/23/2020 1617   PFT: 07/22/19 FVC 3.40 (66%) FEV1 3.03 (79%) Ratio 89  TLC 71% DLCO 56% Interpretation: Mild restrictive defect with moderately reduced gas exchange.  05/19/20 FVC 4.24 (83%) FEV1 3.78 (100%) Ratio 89  TLC 82% DLCO 81%. No BD response Interpretation: Normal PFTs.   05/26/20 Split night sleep study- Severe obstructive sleep apnea occurred during the diagnostic portion of the study (AHI = 72.7/hour). An optimal PAP pressure was selected for this patient (16 cm of water ).   Airview download 10/15/22-11/13/22 30/30 days; 30 days (100%) > 4 hours Average usage 6 hours 18 mins Pressure 16/12 cm h20 AHI 7.9  CPAP 02/14/23-05/14/23 Usage days 90/90 days (100%) >4 hours 81 days (90%) IPAP/EPAP 16/12 cm H20 AHI 10.2  CPAP 04/30/24-05/29/24 Usage days  30/30 days (100%) >4 hours 30 days (30%) IPAP/EPAP 16/12 cm H20 AHI 6.1  Assessment & Plan:   OSA The natural history, progression and prognosis of sleep apnea, treatment with PAP and alternative treatment strategies were discussed. The patient was also educated regarding the long term cardiovascular benefits of treating sleep apnea, including improved blood pressure control, reduction in MI and stroke risk as well as other potential  benefits of treatment, such as improved glycemic control, facilitation of weight loss, improved energy during the day and improved sleep quality. --Patient uses NIV for more than four hours nightly for at least 70% of nights during the last three months of usage. The patient has been using and benefiting from PAP use and will continue to benefit from therapy.  --Counseled on sleep hygiene --Counseled on weight loss/maintenance of healthy weight --Counseled NOT to drive if/when sleepy --Advised patient to wear CPAP for at least 4 hours each night for greater than 70% of the time to avoid the machine being repossessed by insurance.  Shortness of breath -  Good exercise tolerance. Noticing it worsening --ORDER pulmonary function. Call with results. If abnormal consider SABA.  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 Residual fibrosis on CT. PFTs have normalized. No further oxygen  requirements  I have spent a total time of 36-minutes on the day of the appointment including chart review, data review, collecting history, coordinating care and discussing medical diagnosis and plan with the patient/family. Past medical history, allergies, medications were reviewed. Pertinent imaging, labs and tests included in this note have been reviewed and interpreted independently by me.  Slater Staff, M.D. [facility] Pulmonary/Critical Care Medicine 05/30/2024 9:16 AM   See Tracey for personal pager For hours between 7 PM to 7 AM, please call Elink for urgent questions

## 2024-06-09 ENCOUNTER — Ambulatory Visit: Payer: Self-pay | Admitting: Cardiology

## 2024-06-09 ENCOUNTER — Ambulatory Visit (INDEPENDENT_AMBULATORY_CARE_PROVIDER_SITE_OTHER)

## 2024-06-09 DIAGNOSIS — I2510 Atherosclerotic heart disease of native coronary artery without angina pectoris: Secondary | ICD-10-CM

## 2024-06-09 LAB — CUP PACEART REMOTE DEVICE CHECK: Date Time Interrogation Session: 20250921230224

## 2024-06-10 ENCOUNTER — Ambulatory Visit: Admitting: Pulmonary Disease

## 2024-06-10 DIAGNOSIS — R0602 Shortness of breath: Secondary | ICD-10-CM | POA: Diagnosis not present

## 2024-06-10 LAB — PULMONARY FUNCTION TEST
DL/VA % pred: 92 %
DL/VA: 3.69 ml/min/mmHg/L
DLCO unc % pred: 81 %
DLCO unc: 23.1 ml/min/mmHg
FEF 25-75 Post: 5.71 L/s
FEF 25-75 Pre: 5.7 L/s
FEF2575-%Change-Post: 0 %
FEF2575-%Pred-Post: 210 %
FEF2575-%Pred-Pre: 210 %
FEV1-%Change-Post: 0 %
FEV1-%Pred-Post: 107 %
FEV1-%Pred-Pre: 108 %
FEV1-Post: 3.9 L
FEV1-Pre: 3.93 L
FEV1FVC-%Change-Post: 0 %
FEV1FVC-%Pred-Pre: 118 %
FEV6-%Change-Post: -1 %
FEV6-%Pred-Post: 96 %
FEV6-%Pred-Pre: 97 %
FEV6-Post: 4.47 L
FEV6-Pre: 4.53 L
FEV6FVC-%Pred-Post: 105 %
FEV6FVC-%Pred-Pre: 105 %
FVC-%Change-Post: -1 %
FVC-%Pred-Post: 91 %
FVC-%Pred-Pre: 92 %
FVC-Post: 4.47 L
FVC-Pre: 4.53 L
Post FEV1/FVC ratio: 87 %
Post FEV6/FVC ratio: 100 %
Pre FEV1/FVC ratio: 87 %
Pre FEV6/FVC Ratio: 100 %
RV % pred: 69 %
RV: 1.84 L
TLC % pred: 84 %
TLC: 6.44 L

## 2024-06-10 NOTE — Progress Notes (Signed)
 Remote Loop Recorder Transmission

## 2024-06-10 NOTE — Progress Notes (Signed)
 Full PFT performed today.

## 2024-06-10 NOTE — Patient Instructions (Signed)
 Full PFT performed today.

## 2024-06-13 ENCOUNTER — Ambulatory Visit: Admitting: Cardiology

## 2024-06-23 NOTE — Progress Notes (Signed)
 Remote Loop Recorder Transmission

## 2024-06-24 ENCOUNTER — Ambulatory Visit: Payer: Self-pay | Admitting: Pulmonary Disease

## 2024-06-25 NOTE — Progress Notes (Signed)
 Pt.notified

## 2024-07-08 ENCOUNTER — Encounter

## 2024-07-10 ENCOUNTER — Encounter

## 2024-07-10 ENCOUNTER — Ambulatory Visit

## 2024-07-10 DIAGNOSIS — I4891 Unspecified atrial fibrillation: Secondary | ICD-10-CM

## 2024-07-10 LAB — CUP PACEART REMOTE DEVICE CHECK: Date Time Interrogation Session: 20251022230404

## 2024-07-11 ENCOUNTER — Ambulatory Visit: Payer: Self-pay | Admitting: Cardiology

## 2024-07-11 NOTE — Progress Notes (Signed)
 Remote Loop Recorder Transmission

## 2024-07-21 ENCOUNTER — Encounter: Payer: Self-pay | Admitting: Radiology

## 2024-08-08 ENCOUNTER — Encounter

## 2024-08-10 ENCOUNTER — Ambulatory Visit

## 2024-08-10 DIAGNOSIS — I4891 Unspecified atrial fibrillation: Secondary | ICD-10-CM

## 2024-08-11 ENCOUNTER — Encounter

## 2024-08-11 LAB — CUP PACEART REMOTE DEVICE CHECK: Date Time Interrogation Session: 20251122230522

## 2024-08-12 NOTE — Progress Notes (Signed)
 Remote Loop Recorder Transmission

## 2024-08-13 ENCOUNTER — Ambulatory Visit: Payer: Self-pay | Admitting: Cardiology

## 2024-08-26 ENCOUNTER — Ambulatory Visit (HOSPITAL_COMMUNITY): Admission: RE | Admit: 2024-08-26 | Discharge: 2024-08-26 | Attending: Internal Medicine | Admitting: Internal Medicine

## 2024-08-26 ENCOUNTER — Encounter (HOSPITAL_COMMUNITY): Payer: Self-pay | Admitting: Internal Medicine

## 2024-08-26 VITALS — BP 134/88 | HR 59 | Ht 73.0 in | Wt 261.4 lb

## 2024-08-26 DIAGNOSIS — D6869 Other thrombophilia: Secondary | ICD-10-CM | POA: Diagnosis not present

## 2024-08-26 DIAGNOSIS — I480 Paroxysmal atrial fibrillation: Secondary | ICD-10-CM | POA: Diagnosis not present

## 2024-08-26 MED ORDER — APIXABAN 5 MG PO TABS: 5.0000 mg | ORAL_TABLET | Freq: Two times a day (BID) | ORAL | 3 refills | Status: AC

## 2024-08-26 NOTE — Progress Notes (Signed)
 Primary Care Physician: Kristie Morene [name] Primary Cardiologist: Lynwood Rakers, MD (Inactive) Electrophysiologist: None     Referring Physician: Device clinic     Colin [name] is a 73 y.o. male with a history of HTN, HLD, CVA, CAD, thoracic aortic aneurysm, and atrial fibrillation who presents for consultation in the [facility]. Device alert on 6/2 for new Afib. Patient has a CHADS2VASC score of 5.  Follow-up 08/26/2024.  Patient is currently in NSR.  Review of recent ILR checks have been very reassuring with no new A-fib episodes since June 2025.  Patient wears an Scientist, Physiological and has noted no significant A-fib burden.  No bleeding issues on Eliquis .  Today, he denies symptoms of palpitations, chest pain, shortness of breath, orthopnea, PND, lower extremity edema, dizziness, presyncope, syncope, snoring, daytime somnolence, bleeding, or neurologic sequela. The patient is tolerating medications without difficulties and is otherwise without complaint today.   he has a BMI of Body mass index is 34.49 kg/m.SABRA Filed Weights   08/26/24 1435  Weight: 118.6 kg     Current Outpatient Medications  Medication Sig Dispense Refill   acetaminophen  (TYLENOL ) 500 MG tablet as needed for moderate pain (pain score 4-6).     Ascorbic Acid (VITAMIN C PO) Take 1 tablet by mouth every morning.     levothyroxine  (SYNTHROID ) 25 MCG tablet Take 25 mcg by mouth daily.     losartan -hydrochlorothiazide  (HYZAAR) 50-12.5 MG tablet Take 1 tablet by mouth daily.     Multiple Vitamin (MULTIVITAMIN ADULT PO) Take 1 tablet by mouth every morning.     Omega-3 Fatty Acids (FISH OIL) 1000 MG CAPS Take 1,000 mg by mouth daily.      pantoprazole  (PROTONIX ) 40 MG tablet Take 40 mg by mouth daily as needed for heartburn or indigestion.     rosuvastatin  (CRESTOR ) 20 MG tablet Take 1 tablet (20 mg total) by mouth daily. 90 tablet 1   sildenafil (VIAGRA) 25 MG tablet Take 25 mg by mouth  daily as needed for erectile dysfunction.     VITAMIN D PO Taking 2 gel capsules by mouth daily     apixaban  (ELIQUIS ) 5 MG TABS tablet Take 1 tablet (5 mg total) by mouth 2 (two) times daily. 60 tablet 3   No current facility-administered medications for this encounter.    Atrial Fibrillation Management history:  Previous antiarrhythmic drugs: none Previous cardioversions: none Previous ablations: none Anticoagulation history: Eliquis    ROS- All systems are reviewed and negative except as per the HPI above.  Physical Exam: BP 134/88   Pulse (!) 59   Ht 6' 1 (1.854 m)   Wt 118.6 kg   BMI 34.49 kg/m   GEN- The patient is well appearing, alert and oriented x 3 today.   Neck - no JVD or carotid bruit noted Lungs- Clear to ausculation bilaterally, normal work of breathing Heart- Regular rate and rhythm, no murmurs, rubs or gallops, PMI not laterally displaced Extremities- no clubbing, cyanosis, or edema Skin - no rash or ecchymosis noted   EKG today demonstrates  EKG Interpretation Date/Time:  Tuesday August 26 2024 14:38:35 EST Ventricular Rate:  59 PR Interval:  184 QRS Duration:  92 QT Interval:  420 QTC Calculation: 415 R Axis:   17  Text Interpretation: Sinus bradycardia with occasional Premature ventricular complexes Otherwise normal ECG When compared with ECG of 08-May-2024 14:47, Premature ventricular complexes are now Present Confirmed by Terra Pac [phone] on 08/26/2024 2:56:25 PM  Echo 03/24/20 demonstrated   1. Left ventricular ejection fraction, by estimation, is 60 to 65%. The  left ventricle has normal function. The left ventricle has no regional  wall motion abnormalities. Left ventricular diastolic parameters were  normal.   2. Right ventricular systolic function is normal. The right ventricular  size is normal.   3. Negative bubble study for right to left shunt.   4. The mitral valve is normal in structure. No evidence of mitral valve   regurgitation. No evidence of mitral stenosis.   5. Calcified non coronary cusp. The aortic valve was not well visualized.  Aortic valve regurgitation is not visualized. Mild to moderate aortic  valve sclerosis/calcification is present, without any evidence of aortic  stenosis.   6. The inferior vena cava is normal in size with greater than 50%  respiratory variability, suggesting right atrial pressure of 3 mmHg.   ASSESSMENT & PLAN CHA2DS2-VASc Score = 5  The patient's score is based upon: CHF History: 0 HTN History: 1 Diabetes History: 0 Stroke History: 2 Vascular Disease History: 1 Age Score: 1 Gender Score: 0       ASSESSMENT AND PLAN: Paroxysmal Atrial Fibrillation (ICD10:  I48.0) The patient's CHA2DS2-VASc score is 5, indicating a 7.2% annual risk of stroke.    Patient is currently in NSR.  Review of recent ILR checks are overall reassuring; there are no new A-fib episodes since June of this year.  After discussion, we will continue with conservative observation.  No medication changes recommended at this time. He notes previous discussion with Dr. Lavona regarding ILR.  He notes that he is likely not to replace it once the device reaches end-of-life.  Secondary Hypercoagulable State (ICD10:  D68.69) The patient is at significant risk for stroke/thromboembolism based upon his CHA2DS2-VASc Score of 5.   No bleeding issues on anticoagulant.  Continue Eliquis  5 mg twice daily.   Follow up as scheduled with Dr. Pietro.    Terra Pac, PA-C  [facility] [address] [place], [place] [address] [phone]

## 2024-08-26 NOTE — Patient Instructions (Signed)
Device Clinic: [phone] 

## 2024-08-29 DIAGNOSIS — M545 Low back pain, unspecified: Secondary | ICD-10-CM | POA: Diagnosis not present

## 2024-09-08 ENCOUNTER — Encounter

## 2024-09-10 ENCOUNTER — Ambulatory Visit

## 2024-09-10 DIAGNOSIS — I4891 Unspecified atrial fibrillation: Secondary | ICD-10-CM

## 2024-09-11 ENCOUNTER — Encounter

## 2024-09-11 LAB — CUP PACEART REMOTE DEVICE CHECK: Date Time Interrogation Session: 20251223230454

## 2024-09-12 ENCOUNTER — Ambulatory Visit: Payer: Self-pay | Admitting: Cardiology

## 2024-09-12 NOTE — Progress Notes (Signed)
 Remote Loop Recorder Transmission

## 2024-09-30 ENCOUNTER — Other Ambulatory Visit: Payer: Self-pay | Admitting: Neurological Surgery

## 2024-09-30 DIAGNOSIS — M5416 Radiculopathy, lumbar region: Secondary | ICD-10-CM

## 2024-10-09 ENCOUNTER — Encounter

## 2024-10-11 ENCOUNTER — Ambulatory Visit: Attending: Physician Assistant

## 2024-10-13 ENCOUNTER — Encounter

## 2024-10-14 ENCOUNTER — Ambulatory Visit
Admission: RE | Admit: 2024-10-14 | Discharge: 2024-10-14 | Disposition: A | Source: Ambulatory Visit | Attending: Neurological Surgery | Admitting: Neurological Surgery

## 2024-10-14 DIAGNOSIS — M5416 Radiculopathy, lumbar region: Secondary | ICD-10-CM

## 2024-10-15 ENCOUNTER — Telehealth: Payer: Self-pay | Admitting: Cardiology

## 2024-10-15 NOTE — Telephone Encounter (Signed)
 Called re missed remote

## 2024-10-29 ENCOUNTER — Ambulatory Visit: Admitting: Cardiology

## 2024-11-09 ENCOUNTER — Encounter

## 2024-11-11 ENCOUNTER — Ambulatory Visit

## 2024-11-13 ENCOUNTER — Encounter

## 2024-11-24 ENCOUNTER — Ambulatory Visit (HOSPITAL_BASED_OUTPATIENT_CLINIC_OR_DEPARTMENT_OTHER): Admitting: Pulmonary Disease

## 2024-12-10 ENCOUNTER — Encounter

## 2024-12-12 ENCOUNTER — Ambulatory Visit

## 2024-12-15 ENCOUNTER — Encounter

## 2025-01-10 ENCOUNTER — Encounter

## 2025-01-12 ENCOUNTER — Ambulatory Visit

## 2025-01-15 ENCOUNTER — Encounter

## 2025-02-10 ENCOUNTER — Encounter

## 2025-02-16 ENCOUNTER — Encounter

## 2025-03-19 ENCOUNTER — Encounter

## 2025-04-20 ENCOUNTER — Encounter
